# Patient Record
Sex: Male | Born: 1955
Health system: Southern US, Community
[De-identification: ages and names within clinical notes are randomized; demographics above are authoritative.]

## PROBLEM LIST (undated history)

## (undated) DIAGNOSIS — Z923 Personal history of irradiation: Secondary | ICD-10-CM

## (undated) DIAGNOSIS — I10 Essential (primary) hypertension: Secondary | ICD-10-CM

## (undated) DIAGNOSIS — Z87891 Personal history of nicotine dependence: Secondary | ICD-10-CM

## (undated) DIAGNOSIS — K5792 Diverticulitis of intestine, part unspecified, without perforation or abscess without bleeding: Secondary | ICD-10-CM

## (undated) DIAGNOSIS — J810 Acute pulmonary edema: Secondary | ICD-10-CM

## (undated) DIAGNOSIS — R51 Headache: Secondary | ICD-10-CM

## (undated) DIAGNOSIS — B1920 Unspecified viral hepatitis C without hepatic coma: Secondary | ICD-10-CM

## (undated) HISTORY — DX: Acute pulmonary edema: J81.0

## (undated) HISTORY — DX: Essential (primary) hypertension: I10

## (undated) HISTORY — DX: Personal history of irradiation: Z92.3

## (undated) HISTORY — DX: Diverticulitis of intestine, part unspecified, without perforation or abscess without bleeding: K57.92

## (undated) HISTORY — DX: Unspecified viral hepatitis C without hepatic coma: B19.20

## (undated) HISTORY — PX: TONSILLECTOMY: SUR1361

## (undated) HISTORY — DX: Personal history of nicotine dependence: Z87.891

---

## 2003-04-15 ENCOUNTER — Ambulatory Visit (HOSPITAL_COMMUNITY): Admission: EM | Admit: 2003-04-15 | Discharge: 2003-04-15 | Payer: Self-pay | Admitting: Emergency Medicine

## 2005-12-14 ENCOUNTER — Ambulatory Visit (HOSPITAL_COMMUNITY): Admission: RE | Admit: 2005-12-14 | Discharge: 2005-12-14 | Payer: Self-pay | Admitting: Cardiology

## 2009-09-24 ENCOUNTER — Inpatient Hospital Stay (HOSPITAL_COMMUNITY): Admission: EM | Admit: 2009-09-24 | Discharge: 2009-09-26 | Payer: Self-pay | Admitting: Emergency Medicine

## 2009-09-24 ENCOUNTER — Ambulatory Visit: Payer: Self-pay | Admitting: Infectious Diseases

## 2009-09-24 ENCOUNTER — Encounter: Payer: Self-pay | Admitting: Internal Medicine

## 2009-09-24 DIAGNOSIS — J984 Other disorders of lung: Secondary | ICD-10-CM | POA: Insufficient documentation

## 2009-09-26 ENCOUNTER — Encounter (INDEPENDENT_AMBULATORY_CARE_PROVIDER_SITE_OTHER): Payer: Self-pay | Admitting: Internal Medicine

## 2009-09-26 DIAGNOSIS — Z87891 Personal history of nicotine dependence: Secondary | ICD-10-CM | POA: Insufficient documentation

## 2009-09-26 DIAGNOSIS — I10 Essential (primary) hypertension: Secondary | ICD-10-CM

## 2009-09-26 HISTORY — DX: Essential (primary) hypertension: I10

## 2009-10-09 ENCOUNTER — Encounter (INDEPENDENT_AMBULATORY_CARE_PROVIDER_SITE_OTHER): Payer: Self-pay | Admitting: Internal Medicine

## 2009-10-09 ENCOUNTER — Ambulatory Visit: Payer: Self-pay | Admitting: Internal Medicine

## 2009-10-09 LAB — CONVERTED CEMR LAB
CO2: 22 meq/L (ref 19–32)
Glucose, Bld: 88 mg/dL (ref 70–99)

## 2009-10-31 ENCOUNTER — Ambulatory Visit: Payer: Self-pay | Admitting: Internal Medicine

## 2009-10-31 DIAGNOSIS — G4700 Insomnia, unspecified: Secondary | ICD-10-CM

## 2009-10-31 HISTORY — DX: Insomnia, unspecified: G47.00

## 2009-12-06 ENCOUNTER — Emergency Department (HOSPITAL_COMMUNITY): Admission: EM | Admit: 2009-12-06 | Discharge: 2009-12-06 | Payer: Self-pay | Admitting: Emergency Medicine

## 2010-10-30 NOTE — Assessment & Plan Note (Signed)
Summary: HFU/ NEW TO CLINIC/SB.   Vital Signs:  Patient profile:   55 year old male Height:      67 inches (170.18 cm) Weight:      184 pounds (27.73 kg) BMI:     28.92 Temp:     98.1 degrees F (36.72 degrees C) oral Pulse rate:   81 / minute BP sitting:   164 / 61  (right arm)  Vitals Entered By: Krystal Eaton Duncan Dull) (October 09, 2009 1:52 PM) CC: hfu Is Patient Diabetic? No Pain Assessment Patient in pain? no      Nutritional Status BMI of > 30 = obese  Have you ever been in a relationship where you felt threatened, hurt or afraid?No   Does patient need assistance? Functional Status Self care Ambulation Normal   Primary Care Provider:  Nilda Riggs MD  CC:  hfu.  History of Present Illness: Patient continues to feel much better than before when he was hospitalized for hypertensive crisis and pulmonary edema. No longer waking at night with trouble breathing. Has not had cough, congestion. No smoking sice hospitalization. Following with low salt diet, increased amount of fruits. Describes some symptoms of shin splints and pain with extensive walking in the lower extremities, but pain is bearable and mild when present. He is sleeping well and no longer needs ambien. No other complaints. All ROS negative.  Preventive Screening-Counseling & Management  Alcohol-Tobacco     Smoking Status: quit < 6 months  Current Medications (verified): 1)  Furosemide 40 Mg Tabs (Furosemide) .... Take 1 Tablet By Mouth Once A Day 2)  K-Vescent 20 Meq Pack (Potassium Chloride) .... Take 1 Tablet By Mouth Once A Day 3)  Labetalol Hcl 100 Mg Tabs (Labetalol Hcl) .... Take 1 Tablet By Mouth Two Times A Day 4)  Nicotine 21 Mg/24hr Pt24 (Nicotine) .... Please Apply One Patch Daily To The Skin After Removing The Previous Day's Patch.  Allergies (verified): No Known Drug Allergies  Past History:  Past Medical History: Hypertesion (long standing - since 2007, was treated for sometime, but  then discontinued taking meds on his own). Acute pulmonary edema: hospitalized in 09/2009 with hypertensive crisis and acute pulmonary edema - improved with lasix and labetalol. Tobacco abuse: 1-2 ppd for 40 years. Quit 09/2009.  Past Surgical History: None.  Social History: Lives at home with wife, daughter and her two children. Works in [employer] - for last 25 years Smoked 1-2 ppd for 40 years No drugs Alcohol: Drank 4 beers daily until hospitalization in 09/2009. Now does not drink any alcohol.Smoking Status:  quit < 6 months  Review of Systems      See HPI  Physical Exam  General:  alert, well-developed, well-nourished, and well-hydrated.   Head:  normocephalic and atraumatic.   Eyes:  vision grossly intact, pupils equal, pupils round, and pupils reactive to light.   Ears:  no external deformities.   Nose:  no external deformity, no external erythema, and no nasal discharge.   Mouth:  pharynx pink and moist, no erythema, no exudates, fair dentition, and excessive plaque.   Neck:  supple, full ROM, no masses, no thyromegaly, and no thyroid nodules or tenderness.   Lungs:  normal respiratory effort, no intercostal retractions, no accessory muscle use, normal breath sounds, no crackles, and no wheezes.   Heart:  normal rate, regular rhythm, no murmur, no gallop, and no rub.   Abdomen:  soft, non-tender, normal bowel sounds, no distention, no masses, and  no guarding.   Msk:  normal ROM, no joint tenderness, no joint swelling, and no joint warmth.   Pulses:  R radial normal and L radial normal.   Extremities:  No cyanosis, clubbing or edema. Neurologic:  alert & oriented X3, cranial nerves II-XII intact, strength normal in all extremities, sensation intact to light touch, sensation intact to pinprick, and gait normal.   Skin:  turgor normal, color normal, and no rashes.   Cervical Nodes:  no anterior cervical adenopathy.   Psych:  Oriented X3, memory intact for recent and  remote, normally interactive, good eye contact, not anxious appearing, and not depressed appearing.      Impression & Recommendations:  Problem # 1:  HYPERTENSION (ICD-401.9) Assessment Improved Will add Norvasc 5 mg. Patient continues to check bp at home with similar values as in clinic today.  Recheck in room was in high 150s over 80s manually and with the machine. Will see in return appointment in 1 month to check on progress toward bp goal. Much improved that when he was hospitalized. Patient is attempting to follow the DASH diet, may buy a book to help with recipe ideas.  His updated medication list for this problem includes:    Furosemide 40 Mg Tabs (Furosemide) .Marland Kitchen... Take 1 tablet by mouth once a day    Labetalol Hcl 100 Mg Tabs (Labetalol hcl) .Marland Kitchen... Take 1 tablet by mouth two times a day    Amlodipine Besylate 5 Mg Tabs (Amlodipine besylate) .Marland Kitchen... Take 1 tablet by mouth once a day  Orders: T-Basic Metabolic Panel [id number])  BP today: 164/61  Problem # 2:  TOBACCO ABUSE (ICD-305.1) Assessment: Improved Patient provided with script for step down to 14 mg from 21 mg/ day. Patient does not think he will need the patches and is committed to never smoking again. Of note, he has also stopped drinking any alcohol since his hospitalization in an effort to improve his health.  His updated medication list for this problem includes:    Nicoderm Cq 14 Mg/24hr Pt24 (Nicotine) .Marland Kitchen... Please apply one patch daily to the skin after removing the previous day's patch.  Problem # 3:  Preventive Health Care (ICD-V70.0) Patient to make appointment on his own with Dr. Clarene Duke, cardiology, for cardiac followup and possible stress test given hypertensive crisis hospitalization. He is also going to make a follow up appointment with his wife's gastroenterologist (where she had her colonoscopy done) for cancer screening.  Problem # 4:  PULMONARY EDEMA, ACUTE (ICD-518.89) Completely resolved, no  complaints since d/c.  Medications Added to Medication List This Visit: 1)  Nicoderm Cq 14 Mg/24hr Pt24 (Nicotine) .... Please apply one patch daily to the skin after removing the previous day's patch. 2)  Amlodipine Besylate 5 Mg Tabs (Amlodipine besylate) .... Take 1 tablet by mouth once a day  Complete Medication List: 1)  Furosemide 40 Mg Tabs (Furosemide) .... Take 1 tablet by mouth once a day 2)  K-vescent 20 Meq Pack (Potassium chloride) .... Take 1 tablet by mouth once a day 3)  Labetalol Hcl 100 Mg Tabs (Labetalol hcl) .... Take 1 tablet by mouth two times a day 4)  Nicoderm Cq 14 Mg/24hr Pt24 (Nicotine) .... Please apply one patch daily to the skin after removing the previous day's patch. 5)  Amlodipine Besylate 5 Mg Tabs (Amlodipine besylate) .... Take 1 tablet by mouth once a day  Patient Instructions: 1)  Please schedule a follow-up appointment in 1 month. 2)  Please remember  to schedule your followup appointments with a gastroenterologist for a screening colonoscopy and with your cardiologist, Dr. Clarene Duke, for followup after hospitalization in Jan. 2011. 3)  Keep up the good work as you have quit smoking. Prescriptions: AMLODIPINE BESYLATE 5 MG TABS (AMLODIPINE BESYLATE) Take 1 tablet by mouth once a day  #30 x 6   Entered and Authorized by:   Nilda Riggs MD   Signed by:   Nilda Riggs MD on 10/09/2009   Method used:   Print then Give to Patient   RxID:   [id number] NICODERM CQ 14 MG/24HR PT24 (NICOTINE) Please apply one patch daily to the skin after removing the previous day's patch.  #1 box x 0   Entered and Authorized by:   Nilda Riggs MD   Signed by:   Nilda Riggs MD on 10/09/2009   Method used:   Print then Give to Patient   RxID:   [id number]   Prevention & Chronic Care Immunizations   Influenza vaccine: Not documented   Influenza vaccine deferral: Deferred  (10/09/2009)    Tetanus booster: Not documented   Td booster deferral: Deferred   (10/09/2009)    Pneumococcal vaccine: Not documented   Pneumococcal vaccine deferral: Deferred  (10/09/2009)    Immunization comments: Received flu and pneumovax in hospital in 09/2009. Will get tetanus in future.  Colorectal Screening   Hemoccult: Not documented    Colonoscopy: Not documented   Colonoscopy action/deferral: Deferred  (10/09/2009)  Other Screening   PSA: Not documented   Smoking status: quit < 6 months  (10/09/2009)    Screening comments: Patient will schedule colonoscopy with wife's GI dr.  Scarlett Presto   Total Cholesterol: Not documented   LDL: Not documented   LDL Direct: Not documented   HDL: Not documented   Triglycerides: Not documented  Hypertension   Last Blood Pressure: 164 / 61  (10/09/2009)   Serum creatinine: Not documented   BMP action: Ordered   Serum potassium Not documented    Hypertension flowsheet reviewed?: Yes   Progress toward BP goal: Improved  Self-Management Support :   Personal Goals (by the next clinic visit) :      Personal blood pressure goal: 140/90  (10/09/2009)   Patient will work on the following items until the next clinic visit to reach self-care goals:     Medications and monitoring: take my medicines every day  (10/09/2009)     Eating: eat more vegetables, eat foods that are low in salt, eat baked foods instead of fried foods  (10/09/2009)     Activity: join a walking program  (10/09/2009)    Hypertension self-management support: Written self-care plan  (10/09/2009)   Hypertension self-care plan printed.  Process Orders Check Orders Results:     [facility]: ABN not required for this insurance Tests Sent for requisitioning (October 10, 2009 9:23 PM):     10/09/2009: [facility] -- T-Basic Metabolic Panel [id number] (signed)

## 2010-10-30 NOTE — Miscellaneous (Signed)
Summary: HIPAA Restrictions  HIPAA Restrictions   Imported By: Florinda Marker 10/09/2009 16:37:05  _____________________________________________________________________  External Attachment:    Type:   Image     Comment:   External Document

## 2010-10-30 NOTE — Assessment & Plan Note (Signed)
Summary: 56month check up per dr evans/cfb   Vital Signs:  Patient profile:   55 year old male Height:      67 inches (170.18 cm) Weight:      182.3 pounds (82.86 kg) BMI:     28.66 Temp:     98.5 degrees F oral Pulse rate:   77 / minute BP sitting:   150 / 95  (right arm)  Vitals Entered By: Chinita Pester RN (October 31, 2009 8:47 AM) CC: 1 month f/u visit; needs med refills; BP has been elevated. Is Patient Diabetic? No Pain Assessment Patient in pain? no      Nutritional Status BMI of 25 - 29 = overweight  Have you ever been in a relationship where you felt threatened, hurt or afraid?No   Does patient need assistance? Functional Status Self care Ambulation Normal   Immunization History:  Influenza Immunization History:    Influenza:  historical (09/16/2009)   Primary Care Provider:  Nilda Riggs MD  CC:  1 month f/u visit; needs med refills; BP has been elevated.Marland Kitchen  History of Present Illness: 55 year old male with PMH significant for HTN, tobacco abuse, and recent admission 08/2009 for HTN crisis and flash pulmonary edema who presents for a 1 month follow-up. Pt reports he is still not smoking, have been on nicotine patch, and has been taking all medications as prescribed. Pt reports he is feeling better, and that he has been checking his blood pressure regularly. He reports that when he checks his pressure, it is sometimes elevated in the 160s range, he is working on his diet, and reports improvement.   Preventive Screening-Counseling & Management  Alcohol-Tobacco     Alcohol drinks/day: 0     Alcohol type: quit drinking beer     Smoking Status: quit < 6 months     Year Started: 40 yrs. ago     Year Quit: 09/24/09  Caffeine-Diet-Exercise     Type of exercise: walks at work  Current Medications (verified): 1)  Furosemide 40 Mg Tabs (Furosemide) .... Take 1 Tablet By Mouth Once A Day 2)  K-Vescent 20 Meq Pack (Potassium Chloride) .... Take 1 Tablet By Mouth  Once A Day 3)  Labetalol Hcl 100 Mg Tabs (Labetalol Hcl) .... Take 1 Tablet By Mouth Two Times A Day 4)  Nicoderm Cq 14 Mg/24hr Pt24 (Nicotine) .... Please Apply One Patch Daily To The Skin After Removing The Previous Day's Patch. 5)  Amlodipine Besylate 5 Mg Tabs (Amlodipine Besylate) .... Take 1 Tablet By Mouth Once A Day  Allergies (verified): No Known Drug Allergies  Past History:  Past Medical History: Last updated: 10/09/2009 Hypertesion (long standing - since 2007, was treated for sometime, but then discontinued taking meds on his own). Acute pulmonary edema: hospitalized in 09/2009 with hypertensive crisis and acute pulmonary edema - improved with lasix and labetalol. Tobacco abuse: 1-2 ppd for 40 years. Quit 09/2009.  Past Surgical History: Last updated: 10/09/2009 None.  Social History: Last updated: 10/09/2009 Lives at home with wife, daughter and her two children. Works in [employer] - for last 25 years Smoked 1-2 ppd for 40 years No drugs Alcohol: Drank 4 beers daily until hospitalization in 09/2009. Now does not drink any alcohol.  Risk Factors: Alcohol Use: 0 (10/31/2009)  Risk Factors: Smoking Status: quit < 6 months (10/31/2009)  Review of Systems General:  Denies fatigue, sweats, and weakness. Eyes:  Denies blurring. CV:  Denies chest pain or discomfort, difficulty  breathing at night, difficulty breathing while lying down, fatigue, leg cramps with exertion, lightheadness, palpitations, shortness of breath with exertion, and swelling of feet. Resp:  Denies chest pain with inspiration and cough. GI:  Denies abdominal pain, change in bowel habits, nausea, and vomiting. GU:  Denies dysuria, urinary frequency, and urinary hesitancy. MS:  Denies joint pain.  Physical Exam  General:  alert and well-developed.   Head:  normocephalic and atraumatic.   Eyes:  vision grossly intact, pupils equal, pupils round, and pupils reactive to light.   Ears:  R ear  normal and L ear normal.   Mouth:  good dentition.   Neck:  supple, full ROM, and no masses.   Lungs:  normal respiratory effort, no intercostal retractions, no accessory muscle use, normal breath sounds, no dullness, no fremitus, no crackles, and no wheezes.   Heart:  normal rate, regular rhythm, no murmur, no gallop, and no rub.   Abdomen:  soft, non-tender, normal bowel sounds, and no distention.   Msk:  normal ROM and no joint tenderness.   Pulses:  R radial normal, R dorsalis pedis normal, L radial normal, and L dorsalis pedis normal.   Extremities:  no lower extremity edema noted Neurologic:  alert & oriented X3 and cranial nerves II-XII intact.     Impression & Recommendations:  Problem # 1:  HYPERTENSION (ICD-401.9) Blood pressure has improved and was slightly elevated when checked by machine, but upon manual check was wnl. I will increase his Norvasc and have advised patient to contact clinic if he develops any lower extremity swelling. Pt still has not scheduled stress test, but states he is planning on scheduling one now that his blood pressure is under better control. Pt has been compliant with diet and plans to increase exercise.   His updated medication list for this problem includes:    Furosemide 40 Mg Tabs (Furosemide) .Marland Kitchen... Take 1 tablet by mouth once a day    Labetalol Hcl 100 Mg Tabs (Labetalol hcl) .Marland Kitchen... Take 1 tablet by mouth two times a day    Amlodipine Besylate 10 Mg Tabs (Amlodipine besylate) .Marland Kitchen... Take 1 tablet by mouth once a day  BP recheck manually: 138/88 BP today: 150/95 Prior BP: 164/61 (10/09/2009)  Labs Reviewed: K+: 4.2 (10/09/2009) Creat: : 0.94 (10/09/2009)     Problem # 2:  PULMONARY EDEMA, ACUTE (ICD-518.89) Resolved. Pt is on Lasix therapy, no crackles appreciated on exam and pt does not complain of shortness of breath.   Problem # 3:  INSOMNIA (ICD-780.52) Pt reports difficulty initiating sleep and has tried agents such as benedryl without  success. I suspect his insomnia is partially related to his smoking cessation (withdrawal). Pt reports good sleep hygene. He reports using Ambien in the past, and was given [place] during recent hospitalization. Will prescribe ambien for insomnia.   His updated medication list for this problem includes:    Zolpidem Tartrate 5 Mg Tabs (Zolpidem tartrate)  Problem # 4:  TOBACCO ABUSE (ICD-305.1) Assessment: Comment Only Pt is using nicotine patch with success. Will continue with patch as directed.   His updated medication list for this problem includes:    Nicoderm Cq 14 Mg/24hr Pt24 (Nicotine) .Marland Kitchen... Please apply one patch daily to the skin after removing the previous day's patch.  Complete Medication List: 1)  Furosemide 40 Mg Tabs (Furosemide) .... Take 1 tablet by mouth once a day 2)  K-vescent 20 Meq Pack (Potassium chloride) .... Take 1 tablet by mouth once a  day 3)  Labetalol Hcl 100 Mg Tabs (Labetalol hcl) .... Take 1 tablet by mouth two times a day 4)  Nicoderm Cq 14 Mg/24hr Pt24 (Nicotine) .... Please apply one patch daily to the skin after removing the previous day's patch. 5)  Amlodipine Besylate 10 Mg Tabs (Amlodipine besylate) .... Take 1 tablet by mouth once a day 6)  Zolpidem Tartrate 5 Mg Tabs (Zolpidem tartrate)  Patient Instructions: 1)  Please schedule a follow-up appointment in 3 months. 2)  Please contact clinic if you develop shortness of breath, or lower extremity swelling.  3)  Check your Blood Pressure regularly. If it is above: you should make an appointment. Prescriptions: ZOLPIDEM TARTRATE 5 MG TABS (ZOLPIDEM TARTRATE)   #30 x 2   Entered and Authorized by:   Melida Quitter MD   Signed by:   Melida Quitter MD on 10/31/2009   Method used:   Print then Give to Patient   RxID:   [id number] AMLODIPINE BESYLATE 10 MG TABS (AMLODIPINE BESYLATE) Take 1 tablet by mouth once a day  #90 x 3   Entered and Authorized by:   Melida Quitter MD   Signed by:   Melida Quitter MD on 10/31/2009   Method used:   Print then Give to Patient   RxID:   [id number] LABETALOL HCL 100 MG TABS (LABETALOL HCL) Take 1 tablet by mouth two times a day  #180 x 3   Entered and Authorized by:   Melida Quitter MD   Signed by:   Melida Quitter MD on 10/31/2009   Method used:   Print then Give to Patient   RxID:   [id number] K-VESCENT 20 MEQ PACK (POTASSIUM CHLORIDE) Take 1 tablet by mouth once a day  #90 x 3   Entered and Authorized by:   Melida Quitter MD   Signed by:   Melida Quitter MD on 10/31/2009   Method used:   Print then Give to Patient   RxID:   [id number] FUROSEMIDE 40 MG TABS (FUROSEMIDE) Take 1 tablet by mouth once a day  #90 x 3   Entered and Authorized by:   Melida Quitter MD   Signed by:   Melida Quitter MD on 10/31/2009   Method used:   Print then Give to Patient   RxID:   [id number]   Prevention & Chronic Care Immunizations   Influenza vaccine: Historical  (09/16/2009)   Influenza vaccine deferral: Deferred  (10/09/2009)    Tetanus booster: Not documented   Td booster deferral: Deferred  (10/09/2009)    Pneumococcal vaccine: Not documented   Pneumococcal vaccine deferral: Deferred  (10/09/2009)  Colorectal Screening   Hemoccult: Not documented    Colonoscopy: Not documented   Colonoscopy action/deferral: Deferred  (10/09/2009)  Other Screening   PSA: Not documented   Smoking status: quit < 6 months  (10/31/2009)  Lipids   Total Cholesterol: Not documented   LDL: Not documented   LDL Direct: Not documented   HDL: Not documented   Triglycerides: Not documented  Hypertension   Last Blood Pressure: 150 / 95  (10/31/2009)   Serum creatinine: 0.94  (10/09/2009)   BMP action: Ordered   Serum potassium 4.2  (10/09/2009)    Hypertension flowsheet reviewed?: Yes   Progress toward BP goal: Improved  Self-Management Support :   Personal Goals (by the next clinic visit) :      Personal blood pressure goal: 140/90   (10/31/2009)   Patient will work on the following items until  the next clinic visit to reach self-care goals:     Medications and monitoring: bring all of my medications to every visit  (10/31/2009)     Eating: drink diet soda or water instead of juice or soda, eat more vegetables, use fresh or frozen vegetables, eat foods that are low in salt, eat baked foods instead of fried foods, eat fruit for snacks and desserts, limit or avoid alcohol  (10/31/2009)     Activity: take a 30 minute walk every day  (10/31/2009)    Hypertension self-management support: BP self-monitoring log, Written self-care plan, Education handout  (10/31/2009)   Hypertension self-care plan printed.   Hypertension education handout printed

## 2010-11-30 ENCOUNTER — Encounter: Payer: Self-pay | Admitting: Internal Medicine

## 2010-12-29 LAB — COMPREHENSIVE METABOLIC PANEL
ALT: 42 U/L (ref 0–53)
Albumin: 3.4 g/dL — ABNORMAL LOW (ref 3.5–5.2)
CO2: 20 mEq/L (ref 19–32)
Calcium: 8.7 mg/dL (ref 8.4–10.5)
Creatinine, Ser: 0.91 mg/dL (ref 0.4–1.5)
GFR calc Af Amer: >60 mL/min (ref >60)
Glucose, Bld: 217 mg/dL — ABNORMAL HIGH (ref 70–99)
Total Bilirubin: 0.7 mg/dL (ref 0.3–1.2)

## 2010-12-29 LAB — CBC
Hemoglobin: 16.8 g/dL (ref 13.0–17.0)
MCHC: 33.8 g/dL (ref 30.0–36.0)
MCHC: 33.9 g/dL (ref 30.0–36.0)
MCHC: 34.1 g/dL (ref 30.0–36.0)
MCV: 89.7 fL (ref 78.0–100.0)
MCV: 90.6 fL (ref 78.0–100.0)
Platelets: 203 10*3/uL (ref 150–400)
RDW: 13.2 % (ref 11.5–15.5)
WBC: 10.1 10*3/uL (ref 4.0–10.5)
WBC: 15.4 10*3/uL — ABNORMAL HIGH (ref 4.0–10.5)

## 2010-12-29 LAB — POCT I-STAT, CHEM 8
BUN: 15 mg/dL (ref 6–23)
Glucose, Bld: 225 mg/dL — ABNORMAL HIGH (ref 70–99)
Hemoglobin: 18 g/dL — ABNORMAL HIGH (ref 13.0–17.0)
Potassium: 4.2 mEq/L (ref 3.5–5.1)

## 2010-12-29 LAB — DIFFERENTIAL
Eosinophils Relative: 2 % (ref 0–5)
Lymphocytes Relative: 19 % (ref 12–46)
Monocytes Absolute: 0.4 10*3/uL (ref 0.1–1.0)
Monocytes Relative: 4 % (ref 3–12)

## 2010-12-29 LAB — URINALYSIS, ROUTINE W REFLEX MICROSCOPIC
Glucose, UA: 250 mg/dL — AB
Ketones, ur: NEGATIVE mg/dL
Nitrite: NEGATIVE
pH: 6.5 (ref 5.0–8.0)

## 2010-12-29 LAB — GLUCOSE, CAPILLARY
Glucose-Capillary: 105 mg/dL — ABNORMAL HIGH (ref 70–99)
Glucose-Capillary: 131 mg/dL — ABNORMAL HIGH (ref 70–99)
Glucose-Capillary: 137 mg/dL — ABNORMAL HIGH (ref 70–99)
Glucose-Capillary: 143 mg/dL — ABNORMAL HIGH (ref 70–99)
Glucose-Capillary: 143 mg/dL — ABNORMAL HIGH (ref 70–99)
Glucose-Capillary: 170 mg/dL — ABNORMAL HIGH (ref 70–99)
Glucose-Capillary: 178 mg/dL — ABNORMAL HIGH (ref 70–99)
Glucose-Capillary: 91 mg/dL (ref 70–99)

## 2010-12-29 LAB — BASIC METABOLIC PANEL
BUN: 17 mg/dL (ref 6–23)
BUN: 24 mg/dL — ABNORMAL HIGH (ref 6–23)
CO2: 23 mEq/L (ref 19–32)
CO2: 24 mEq/L (ref 19–32)
Calcium: 9.3 mg/dL (ref 8.4–10.5)
Chloride: 100 mEq/L (ref 96–112)
Chloride: 103 mEq/L (ref 96–112)
Creatinine, Ser: 1.01 mg/dL (ref 0.4–1.5)
Creatinine, Ser: 1.09 mg/dL (ref 0.4–1.5)
Creatinine, Ser: 1.13 mg/dL (ref 0.4–1.5)
GFR calc non Af Amer: >60 mL/min (ref >60)
Glucose, Bld: 123 mg/dL — ABNORMAL HIGH (ref 70–99)
Glucose, Bld: 134 mg/dL — ABNORMAL HIGH (ref 70–99)
Potassium: 3.9 mEq/L (ref 3.5–5.1)

## 2010-12-29 LAB — CARDIAC PANEL(CRET KIN+CKTOT+MB+TROPI)
CK, MB: 2.8 ng/mL (ref 0.3–4.0)
Total CK: 223 U/L (ref 7–232)
Troponin I: 0.04 ng/mL (ref 0.00–0.06)
Troponin I: 0.09 ng/mL — ABNORMAL HIGH (ref 0.00–0.06)

## 2010-12-29 LAB — CULTURE, BLOOD (ROUTINE X 2)
Culture: NO GROWTH
Culture: NO GROWTH

## 2010-12-29 LAB — LIPID PANEL
HDL: 38 mg/dL — ABNORMAL LOW (ref >39)
VLDL: 10 mg/dL (ref 0–40)

## 2010-12-29 LAB — HIV ANTIBODY (ROUTINE TESTING W REFLEX): HIV: NONREACTIVE

## 2010-12-29 LAB — RAPID URINE DRUG SCREEN, HOSP PERFORMED
Barbiturates: NOT DETECTED
Cocaine: NOT DETECTED
Opiates: NOT DETECTED

## 2010-12-29 LAB — LACTIC ACID, PLASMA: Lactic Acid, Venous: 3.5 mmol/L — ABNORMAL HIGH (ref 0.5–2.2)

## 2010-12-29 LAB — POCT I-STAT 3, ART BLOOD GAS (G3+)
O2 Saturation: 96 %
pCO2 arterial: 38 mmHg (ref 35.0–45.0)
pH, Arterial: 7.388 (ref 7.350–7.450)
pO2, Arterial: 80 mmHg (ref 80.0–100.0)

## 2010-12-29 LAB — CK TOTAL AND CKMB (NOT AT ARMC)
CK, MB: 2.1 ng/mL (ref 0.3–4.0)
Total CK: 94 U/L (ref 7–232)

## 2010-12-29 LAB — ALDOSTERONE + RENIN ACTIVITY W/ RATIO
ALDO / PRA Ratio: 1.8 Ratio (ref 0.9–28.9)
Aldosterone: 4 ng/dL
PRA LC/MS/MS: 2.23 ng/mL/h (ref 0.25–5.82)

## 2010-12-29 LAB — POCT CARDIAC MARKERS: Troponin i, poc: <0.05 ng/mL (ref 0.00–0.09)

## 2010-12-29 LAB — URINE MICROSCOPIC-ADD ON

## 2010-12-29 LAB — URINE CULTURE: Colony Count: NO GROWTH

## 2010-12-29 LAB — PROTIME-INR: Prothrombin Time: 14.1 seconds (ref 11.6–15.2)

## 2010-12-29 LAB — TSH: TSH: 1.086 u[IU]/mL (ref 0.350–4.500)

## 2010-12-29 LAB — HEMOGLOBIN A1C: Hgb A1c MFr Bld: 5.8 % (ref 4.6–6.1)

## 2010-12-29 LAB — TROPONIN I: Troponin I: 0.03 ng/mL (ref 0.00–0.06)

## 2010-12-29 LAB — BRAIN NATRIURETIC PEPTIDE: Pro B Natriuretic peptide (BNP): 378 pg/mL — ABNORMAL HIGH (ref 0.0–100.0)

## 2010-12-29 LAB — MRSA PCR SCREENING: MRSA by PCR: NEGATIVE

## 2011-02-13 NOTE — Op Note (Signed)
NAME:  Ellegood, Hau                               ACCOUNT NO.:  [id number]   MEDICAL RECORD NO.:  [id number]                    PATIENT TYPE:   LOCATION:                                       FACILITY:   PHYSICIAN:  Graylin Shiver, M.D.                DATE OF BIRTH:   DATE OF PROCEDURE:  04/15/2003  DATE OF DISCHARGE:                                 OPERATIVE REPORT   PROCEDURE:  Esophagogastroduodenoscopy with foreign body removal as an  esophageal meat impaction and biopsy for CLO-test.   INDICATIONS FOR PROCEDURE:  This patient is a 55 year old male who presented  to the emergency room with complaint of being unable to swallow.  He was  eating pork four days ago and states that he got distracted and swallowed  the large piece of pork without chewing it.  He put up with it for the last  few days thinking that it would pass but it did not.  He decided to come to  the emergency room.   Informed consent was obtained after explanation of the risks of bleeding,  infection and perforation with possible need for surgery.   PREMEDICATIONS:  Fentanyl total dose 115 mcg IV, Versed total dose 16 mg IV.   DESCRIPTION OF PROCEDURE:  With the patient in the left lateral decubitus  position, the Olympus gastroscope was inserted into the oropharynx and  passed into the esophagus.  It was advanced down the esophagus into the  lower esophagus at which point a large amount of meat could be seen.  The  snare with basket was initially put down and pieces of the meat were  grabbed.  The meat was fragmenting as it was grabbed and multiple pieces  were removed on several occasions.  The scope had to be repassed on several  occasions.  I used a snare.  I used a tripod to try to dislodge the large  piece of meat impacted at the distal esophagus and finally after multiple  attempts, the meat impaction was dislodged and removed.  The scope was  reinserted and the scope was advanced down the esophagus.   There was  irritation at the level of the distal esophagus in the EG junction where the  meat had been impacted for the past four days. I did not see an osseous  stricture.  I was able to advance the scope into the stomach and on into the  duodenum.  The second part of the duodenum looked normal.  The bulb of the  duodenum revealed a duodenum bulb ulcer which was about 6 mm in size and  multiple smaller duodenal erosions.  The stomach showed a gastritis.  No  ulcers were seen.  Biopsy for CLO-test was obtained.  No lesions were seen  upon retroflexion of the gastroscope in the fundus or cardia of the stomach.  The scope  was then brought out.  The rest of the smaller pieces of meat did  pass spontaneously into the stomach.  He tolerated the procedure well  without complications.   IMPRESSION:  1. Esophageal meat impaction which was removed.  2. Duodenum ulcer with multiple erosions.   COMMENTS:  The patient will be given a prescription for Aciphex 20 mg daily.  The CLO-test will be checked.                                                Graylin Shiver, M.D.   Germain Osgood  D:  04/15/2003  T:  04/15/2003  Job:  [id number]

## 2011-08-21 ENCOUNTER — Ambulatory Visit (HOSPITAL_COMMUNITY): Admit: 2011-08-21 | Payer: Self-pay | Admitting: Gastroenterology

## 2011-08-21 ENCOUNTER — Encounter (HOSPITAL_COMMUNITY): Payer: Self-pay | Admitting: *Deleted

## 2011-08-21 ENCOUNTER — Emergency Department (HOSPITAL_BASED_OUTPATIENT_CLINIC_OR_DEPARTMENT_OTHER)
Admission: EM | Admit: 2011-08-21 | Discharge: 2011-08-21 | Disposition: A | Discharge status: Still In Hospital | Payer: BC Managed Care – PPO | Source: Home / Self Care | Attending: Emergency Medicine | Admitting: Emergency Medicine

## 2011-08-21 ENCOUNTER — Ambulatory Visit (HOSPITAL_COMMUNITY)
Admission: AD | Admit: 2011-08-21 | Discharge: 2011-08-21 | Disposition: A | Discharge status: Home / Self Care | Payer: BC Managed Care – PPO | Source: Ambulatory Visit | Attending: Emergency Medicine | Admitting: Emergency Medicine

## 2011-08-21 ENCOUNTER — Encounter (HOSPITAL_BASED_OUTPATIENT_CLINIC_OR_DEPARTMENT_OTHER): Payer: Self-pay | Admitting: *Deleted

## 2011-08-21 ENCOUNTER — Encounter (HOSPITAL_BASED_OUTPATIENT_CLINIC_OR_DEPARTMENT_OTHER)
Admission: EM | Disposition: A | Discharge status: Still In Hospital | Payer: Self-pay | Source: Home / Self Care | Attending: Emergency Medicine

## 2011-08-21 ENCOUNTER — Encounter (HOSPITAL_COMMUNITY)
Admission: AD | Disposition: A | Discharge status: Home / Self Care | Payer: Self-pay | Source: Ambulatory Visit | Attending: Emergency Medicine

## 2011-08-21 DIAGNOSIS — K219 Gastro-esophageal reflux disease without esophagitis: Secondary | ICD-10-CM | POA: Insufficient documentation

## 2011-08-21 DIAGNOSIS — IMO0002 Reserved for concepts with insufficient information to code with codable children: Secondary | ICD-10-CM | POA: Insufficient documentation

## 2011-08-21 DIAGNOSIS — T18108A Unspecified foreign body in esophagus causing other injury, initial encounter: Secondary | ICD-10-CM | POA: Insufficient documentation

## 2011-08-21 DIAGNOSIS — I10 Essential (primary) hypertension: Secondary | ICD-10-CM | POA: Insufficient documentation

## 2011-08-21 DIAGNOSIS — K209 Esophagitis, unspecified without bleeding: Secondary | ICD-10-CM | POA: Insufficient documentation

## 2011-08-21 DIAGNOSIS — Z79899 Other long term (current) drug therapy: Secondary | ICD-10-CM | POA: Insufficient documentation

## 2011-08-21 DIAGNOSIS — K2970 Gastritis, unspecified, without bleeding: Secondary | ICD-10-CM | POA: Insufficient documentation

## 2011-08-21 HISTORY — PX: ESOPHAGOGASTRODUODENOSCOPY: SHX5428

## 2011-08-21 HISTORY — DX: Headache: R51

## 2011-08-21 LAB — CBC
HCT: 46.2 % (ref 39.0–52.0)
MCH: 29.8 pg (ref 26.0–34.0)
MCHC: 34.8 g/dL (ref 30.0–36.0)
RDW: 12.4 % (ref 11.5–15.5)

## 2011-08-21 LAB — BASIC METABOLIC PANEL
BUN: 14 mg/dL (ref 6–23)
Chloride: 103 mEq/L (ref 96–112)
Creatinine, Ser: 0.8 mg/dL (ref 0.50–1.35)
GFR calc Af Amer: >90 mL/min (ref >90)
Glucose, Bld: 110 mg/dL — ABNORMAL HIGH (ref 70–99)

## 2011-08-21 SURGERY — EGD (ESOPHAGOGASTRODUODENOSCOPY)
Anesthesia: Moderate Sedation

## 2011-08-21 MED ORDER — DIAZEPAM 5 MG/ML IJ SOLN
5.0000 mg | Freq: Once | INTRAMUSCULAR | Status: AC
  Administered 2011-08-21: 5 mg via INTRAVENOUS
  Filled 2011-08-21: qty 2

## 2011-08-21 MED ORDER — FENTANYL CITRATE 0.05 MG/ML IJ SOLN
INTRAMUSCULAR | Status: AC
  Filled 2011-08-21: qty 2

## 2011-08-21 MED ORDER — SODIUM CHLORIDE 0.9 % IV BOLUS (SEPSIS)
1000.0000 mL | Freq: Once | INTRAVENOUS | Status: AC
  Administered 2011-08-21: 1000 mL via INTRAVENOUS

## 2011-08-21 MED ORDER — DIPHENHYDRAMINE HCL 50 MG/ML IJ SOLN
INTRAMUSCULAR | Status: AC
  Filled 2011-08-21: qty 1

## 2011-08-21 MED ORDER — LABETALOL HCL 5 MG/ML IV SOLN
INTRAVENOUS | Status: AC
  Filled 2011-08-21: qty 4

## 2011-08-21 MED ORDER — LIDOCAINE VISCOUS 2 % MT SOLN
OROMUCOSAL | Status: AC
  Filled 2011-08-21: qty 15

## 2011-08-21 MED ORDER — MIDAZOLAM HCL 10 MG/2ML IJ SOLN
INTRAMUSCULAR | Status: AC
  Filled 2011-08-21: qty 2

## 2011-08-21 MED ORDER — LABETALOL HCL 100 MG PO TABS: 100.0000 mg | ORAL_TABLET | Freq: Two times a day (BID) | ORAL | Status: DC

## 2011-08-21 MED ORDER — LABETALOL HCL 5 MG/ML IV SOLN
20.0000 mg | Freq: Once | INTRAVENOUS | Status: AC
  Administered 2011-08-21: 20 mg via INTRAVENOUS
  Filled 2011-08-21: qty 4

## 2011-08-21 MED ORDER — FENTANYL NICU IV SYRINGE 50 MCG/ML
INJECTION | INTRAMUSCULAR | Status: DC | PRN
  Administered 2011-08-21 (×6): 25 ug via INTRAVENOUS

## 2011-08-21 MED ORDER — MIDAZOLAM HCL 10 MG/2ML IJ SOLN
INTRAMUSCULAR | Status: DC | PRN
  Administered 2011-08-21 (×7): 2 mg via INTRAVENOUS
  Administered 2011-08-21: 1 mg via INTRAVENOUS

## 2011-08-21 MED ORDER — AMLODIPINE BESYLATE 10 MG PO TABS: 10.0000 mg | ORAL_TABLET | Freq: Every day | ORAL | Status: DC

## 2011-08-21 MED ORDER — LABETALOL HCL 5 MG/ML IV SOLN
5.0000 mg | Freq: Once | INTRAVENOUS | Status: AC
  Administered 2011-08-21 (×2): 5 mg via INTRAVENOUS
  Filled 2011-08-21: qty 4

## 2011-08-21 MED ORDER — SODIUM CHLORIDE 0.9 % IV SOLN
Freq: Once | INTRAVENOUS | Status: AC
  Administered 2011-08-21: 500 mL via INTRAVENOUS

## 2011-08-21 MED ORDER — GLUCAGON HCL (RDNA) 1 MG IJ SOLR
1.0000 mg | Freq: Once | INTRAMUSCULAR | Status: AC
  Administered 2011-08-21: 1 mg via INTRAVENOUS
  Filled 2011-08-21: qty 1

## 2011-08-21 NOTE — ED Notes (Signed)
WUJ:WJXB<JY> Expected date:08/21/11<BR> Expected time: 1:23 PM<BR> Means of arrival:<BR> Comments:<BR> From endo

## 2011-08-21 NOTE — ED Provider Notes (Signed)
History     CSN: [id number] Arrival date & time: 08/21/2011 11:26 AM   First MD Initiated Contact with Patient 08/21/11 1514      Chief Complaint  Patient presents with  . Hypertension    (Consider location/radiation/quality/duration/timing/severity/associated sxs/prior treatment) Patient is a 55 y.o. male presenting with hypertension. The history is provided by the patient.  Hypertension This is a chronic problem. Episode onset: He ran out of his BP meds 2 mos. ago.  Progression since onset: Today he presented to [facility], and was eval. for esophageal obstruction. He has since had a food bolus removed by Dr Loreta Ave and is here now for BP meds refill. Pertinent negatives include no chest pain, no abdominal pain, no headaches and no shortness of breath. The symptoms are aggravated by nothing. The symptoms are relieved by nothing. Treatments tried: He was given a dose of Labetolol in the operative theatre. Improvement on treatment: BP improved.    Past Medical History  Diagnosis Date  . Hypertension     long standing since 2007, was treated for sometime, but then discontinued taking meds on his own.  . Pulmonary edema, acute     hospitalized in 09/2009 with hypertensive crisis and acute pulmonary edema- improved with lasix nad labetalol.  . History of tobacco abuse   . Shortness of breath     on exertion  . Headache     Past Surgical History  Procedure Date  . Tonsillectomy     History reviewed. No pertinent family history.  History  Substance Use Topics  . Smoking status: Former Smoker -- 1.0 packs/day for 40 years    Types: Cigarettes    Quit date: 10/01/2009  . Smokeless tobacco: Not on file  . Alcohol Use: No     drank 4 beers daily until hospitalization in 09/2009. Now does not drink any alcohol.      Review of Systems  Respiratory: Negative for shortness of breath.   Cardiovascular: Negative for chest pain.  Gastrointestinal: Negative for abdominal pain.    Neurological: Negative for headaches.  All other systems reviewed and are negative.    Allergies  Review of patient's allergies indicates no known allergies.  Home Medications   Current Outpatient Rx  Name Route Sig Dispense Refill  . FUROSEMIDE 40 MG PO TABS Oral Take 40 mg by mouth daily.     Marland Kitchen AMLODIPINE BESYLATE 10 MG PO TABS Oral Take 10 mg by mouth daily.     Marland Kitchen LABETALOL HCL 100 MG PO TABS Oral Take 100 mg by mouth 2 (two) times daily.       BP 188/118  Pulse 87  Temp(Src) 98.1 F (36.7 C) (Oral)  Resp 23  Ht 5\' 7"  (1.702 m)  Wt 200 lb (90.719 kg)  BMI 31.32 kg/m2  SpO2 96%  Physical Exam  Constitutional: He is oriented to person, place, and time. He appears well-developed and well-nourished.  HENT:  Head: Normocephalic and atraumatic.  Eyes: Conjunctivae and EOM are normal. Pupils are equal, round, and reactive to light.  Neck: Normal range of motion. Neck supple.  Cardiovascular: Normal rate.   Pulmonary/Chest: Effort normal and breath sounds normal.  Abdominal: Soft. Bowel sounds are normal.  Neurological: He is alert and oriented to person, place, and time. No cranial nerve deficit. He exhibits normal muscle tone. Coordination normal.  Skin: Skin is dry.  Psychiatric: He has a normal mood and affect. His behavior is normal. Thought content normal.    ED Course  Procedures (including critical care time)  Labs Reviewed - No data to display No results found. Reviewed old charts: prior BP treatments: Labetolol, Norvasc, and Lasix  No diagnosis found.    MDM  Assymptomatic HTN; doubt hypertensive crises, ACS or metabolic instability.        Flint Melter, MD 08/22/11 [phone]

## 2011-08-21 NOTE — ED Notes (Signed)
Pt sts he has [place] stuck in his throat since yesterday.

## 2011-08-21 NOTE — ED Notes (Signed)
Pt leaving ER at this time via [facility] to [facility].

## 2011-08-21 NOTE — H&P (Signed)
Matthew Lee is an 55 y.o. male.   Chief Complaint: Food impaction since 6 PM yesterday. He has had this happen on 3 occasions in the past.  HPI: 55 year old white male with a food impaction since last night. [facility] and was given a trial of Glucagon along with Labetalol for significantly elevated BP. As he was not able to pass the meat bolus after the administration of glucagon he was sent here to [facility] for an endoscopy. Still had elevated BP prior to the EGD and therefore  was given another 10 mg of Labetalol IV. Has a history of reflux and headaches; takes Ibuprofen on a PRN basis. Very NON-COMPLIANT with medical care [wants to know if we can refill his medications while he is here for an EGD].  Past Medical History  Diagnosis Date  . Hypertension     long standing since 2007, was treated for sometime, but then discontinued taking meds on his own.  . Pulmonary edema, acute     hospitalized in 09/2009 with hypertensive crisis and acute pulmonary edema- improved with lasix nad labetalol.  . History of tobacco abuse   . Shortness of breath     on exertion  . Headache     Past Surgical History  Procedure Date  . Tonsillectomy     History reviewed. No pertinent family history. Social History:  reports that he quit smoking about 22 months ago. His smoking use included Cigarettes. He has a 40 pack-year smoking history. He does not have any smokeless tobacco history on file. He reports that he does not drink alcohol or use illicit drugs.  Allergies: No Known Allergies  Medications Prior to Admission  Medication Dose Route Frequency Provider Last Rate Last Dose  . 0.9 %  sodium chloride infusion   Intravenous Once Charna Elizabeth, MD 20 mL/hr at 08/21/11 1152 500 mL at 08/21/11 1152  . diazepam (VALIUM) injection 5 mg  5 mg Intravenous Once Suzi Roots, MD   5 mg at 08/21/11 0857  . glucagon (GLUCAGEN) injection 1 mg  1 mg Intravenous Once Suzi Roots, MD   1 mg at  08/21/11 0857  . labetalol (NORMODYNE,TRANDATE) injection 20 mg  20 mg Intravenous Once Suzi Roots, MD   20 mg at 08/21/11 0930  . labetalol (NORMODYNE,TRANDATE) injection 5 mg  5 mg Intravenous Once Charna Elizabeth, MD   5 mg at 08/21/11 1147  . sodium chloride 0.9 % bolus 1,000 mL  1,000 mL Intravenous Once Suzi Roots, MD   1,000 mL at 08/21/11 0857   Medications Prior to Admission  Medication Sig Dispense Refill  . furosemide (LASIX) 40 MG tablet Take 40 mg by mouth daily.       Marland Kitchen amLODipine (NORVASC) 10 MG tablet Take 10 mg by mouth daily.       Marland Kitchen labetalol (NORMODYNE) 100 MG tablet Take 100 mg by mouth 2 (two) times daily.         Results for orders placed during the hospital encounter of 08/21/11 (from the past 48 hour(s))  BASIC METABOLIC PANEL     Status: Abnormal   Collection Time   08/21/11  8:45 AM      Component Value Range Comment   Sodium 139  135 - 145 (mEq/L)    Potassium 4.0  3.5 - 5.1 (mEq/L)    Chloride 103  96 - 112 (mEq/L)    CO2 23  19 - 32 (mEq/L)  Glucose, Bld 110 (*) 70 - 99 (mg/dL)    BUN 14  6 - 23 (mg/dL)    Creatinine, Ser 1.61  0.50 - 1.35 (mg/dL)    Calcium 9.5  8.4 - 10.5 (mg/dL)    GFR calc non Af Amer >90  >90 (mL/min)    GFR calc Af Amer >90  >90 (mL/min)   CBC     Status: Normal   Collection Time   08/21/11  8:45 AM      Component Value Range Comment   WBC 10.0  4.0 - 10.5 (K/uL)    RBC 5.40  4.22 - 5.81 (MIL/uL)    Hemoglobin 16.1  13.0 - 17.0 (g/dL)    HCT 09.6  04.5 - 40.9 (%)    MCV 85.6  78.0 - 100.0 (fL)    MCH 29.8  26.0 - 34.0 (pg)    MCHC 34.8  30.0 - 36.0 (g/dL)    RDW 81.1  91.4 - 78.2 (%)    Platelets 295  150 - 400 (K/uL)    No results found.  Review of Systems  Constitutional: Negative for fever, chills, weight loss, malaise/fatigue and diaphoresis.  HENT: Negative for hearing loss, nosebleeds, congestion and neck pain.   Respiratory: Positive for shortness of breath.   Cardiovascular: Negative for chest pain,  palpitations, orthopnea and claudication.  Gastrointestinal: Positive for heartburn and nausea. Negative for vomiting, abdominal pain, diarrhea, constipation, blood in stool and melena.  Genitourinary: Negative for urgency, frequency and hematuria.  Musculoskeletal: Positive for joint pain. Negative for myalgias.  Skin: Negative for [place].  Neurological: Positive for headaches. Negative for dizziness, tingling, tremors and weakness.  Endo/Heme/Allergies: Does not bruise/bleed easily.  Psychiatric/Behavioral: Negative for suicidal ideas, hallucinations, memory loss and substance abuse. The patient is nervous/anxious. The patient does not have insomnia.     Blood pressure 219/134, temperature 98.2 F (36.8 C), temperature source Oral, resp. rate 14, height 5\' 7"  (1.702 m), weight 90.719 kg (200 lb). Physical Exam  Constitutional: He is oriented to person, place, and time. He appears well-developed and well-nourished.  HENT:  Head: Normocephalic and atraumatic.  Mouth/Throat: No oropharyngeal exudate.  Eyes: Pupils are equal, round, and reactive to light. Left eye exhibits no discharge. No scleral icterus.  Neck: Normal range of motion. Neck supple. No JVD present. No tracheal deviation present. No thyromegaly present.  Cardiovascular: Normal rate, regular rhythm, normal heart sounds and intact distal pulses.   Respiratory: Effort normal and breath sounds normal.  GI: Soft. Bowel sounds are normal. He exhibits no distension. There is no tenderness. There is no rebound and no guarding.  Musculoskeletal: Normal range of motion. He exhibits no edema and no tenderness.  Lymphadenopathy:    He has no cervical adenopathy.  Neurological: He is alert and oriented to person, place, and time.  Skin: Skin is warm and dry.  Psychiatric: He has a normal mood and affect. His behavior is normal.     Assessment/Plan  Food impaction: Will proceed with an EGD at this time and make further recommendations  thereafter.   Tearra Ouk 08/21/2011, 11:54 AM

## 2011-08-21 NOTE — ED Notes (Signed)
Pt attempted again to drink water. Obstruction still present.

## 2011-08-21 NOTE — ED Provider Notes (Addendum)
History     CSN: [id number] Arrival date & time: 08/21/2011  8:34 AM   First MD Initiated Contact with Patient 08/21/11 [phone]      Chief Complaint  Patient presents with  . Foreign Body    (Consider location/radiation/quality/duration/timing/severity/associated sxs/prior treatment) The history is provided by the patient.  pt w hx esophageal ring, presents w food bolus/turkey being stuck in esophagus since last pm. Dull, constant fb sensation in mid to distal esophagus. States saliva and any attempt at drinking water refluxes back up a few minutes later. Hx same. States prior endoscopy for same last year. No chest pain or sob. No fever or chills.   Past Medical History  Diagnosis Date  . Hypertension     long standing since 2007, was treated for sometime, but then discontinued taking meds on his own.  . Pulmonary edema, acute     hospitalized in 09/2009 with hypertensive crisis and acute pulmonary edema- improved with lasix nad labetalol.  . History of tobacco abuse     History reviewed. No pertinent past surgical history.  No family history on file.  History  Substance Use Topics  . Smoking status: Former Smoker -- 1.0 packs/day for 40 years    Types: Cigarettes    Quit date: 10/01/2009  . Smokeless tobacco: Not on file  . Alcohol Use: No     drank 4 beers daily until hospitalization in 09/2009. Now does not drink any alcohol.      Review of Systems  Constitutional: Negative for fever.  HENT: Negative for neck pain.   Eyes: Negative for pain.  Respiratory: Negative for shortness of breath.   Cardiovascular: Negative for chest pain.  Gastrointestinal: Negative for abdominal pain.  Genitourinary: Negative for flank pain.  Musculoskeletal: Negative for back pain.  Skin: Negative for [place].  Neurological: Negative for headaches.  Hematological: Does not bruise/bleed easily.  Psychiatric/Behavioral: Negative for behavioral problems.    Allergies  Review of  patient's allergies indicates no known allergies.  Home Medications   Current Outpatient Rx  Name Route Sig Dispense Refill  . AMLODIPINE BESYLATE 10 MG PO TABS Oral Take 10 mg by mouth daily.      . FUROSEMIDE 40 MG PO TABS Oral Take 40 mg by mouth daily.      Marland Kitchen LABETALOL HCL 100 MG PO TABS Oral Take 100 mg by mouth 2 (two) times daily.      Marland Kitchen POTASSIUM CHLORIDE 20 MEQ PO PACK Oral Take 20 mEq by mouth daily.      Marland Kitchen ZOLPIDEM TARTRATE 5 MG PO TABS Oral Take 5 mg by mouth at bedtime as needed.        BP 214/129  Pulse 104  Temp(Src) 98.4 F (36.9 C) (Oral)  Resp 18  SpO2 98%  Physical Exam  Nursing note and vitals reviewed. Constitutional: He is oriented to person, place, and time. He appears well-developed and well-nourished. No distress.  HENT:  Head: Atraumatic.  Eyes: Pupils are equal, round, and reactive to light.  Neck: Neck supple. No tracheal deviation present.  Cardiovascular: Normal rate, regular rhythm, normal heart sounds and intact distal pulses.   Pulmonary/Chest: Effort normal. No accessory muscle usage. No respiratory distress. He has rales.  Abdominal: Soft. He exhibits no distension. There is no tenderness.  Musculoskeletal: He exhibits no edema.  Neurological: He is alert and oriented to person, place, and time.  Skin: Skin is warm and dry.  Psychiatric: He has a normal mood and affect.  ED Course  Procedures (including critical care time)  Labs Reviewed  BASIC METABOLIC PANEL - Abnormal; Notable for the following:    Glucose, Bld 110 (*)    All other components within normal limits  CBC   No results found. Results for orders placed during the hospital encounter of 08/21/11  BASIC METABOLIC PANEL      Component Value Range   Sodium 139  135 - 145 (mEq/L)   Potassium 4.0  3.5 - 5.1 (mEq/L)   Chloride 103  96 - 112 (mEq/L)   CO2 23  19 - 32 (mEq/L)   Glucose, Bld 110 (*) 70 - 99 (mg/dL)   BUN 14  6 - 23 (mg/dL)   Creatinine, Ser 9.14  0.50 -  1.35 (mg/dL)   Calcium 9.5  8.4 - 78.2 (mg/dL)   GFR calc non Af Amer >90  >90 (mL/min)   GFR calc Af Amer >90  >90 (mL/min)  CBC      Component Value Range   WBC 10.0  4.0 - 10.5 (K/uL)   RBC 5.40  4.22 - 5.81 (MIL/uL)   Hemoglobin 16.1  13.0 - 17.0 (g/dL)   HCT 95.6  21.3 - 08.6 (%)   MCV 85.6  78.0 - 100.0 (fL)   MCH 29.8  26.0 - 34.0 (pg)   MCHC 34.8  30.0 - 36.0 (g/dL)   RDW 57.8  46.9 - 62.9 (%)   Platelets 295  150 - 400 (K/uL)   No results found.    1. Esophageal foreign body   2. Hypertension       MDM  Pt requests we try to give 'muscle relaxer' before committing to EGD.  Iv ns. Glucagon iv, valium iv. Labs.   Reviewed e chart and nursing notes.   Trial water. Pt refluxes back up within approx 1 minutes time. Gi called.   Recheck bp high. Pt states he has been out of his bp meds for 2-3 months. Denies headache. No chest pain or sob.   labetolol iv.   [facility] indicated lengthy time period since seen by them and never followed up in office, therefore pt unassigned from gi standpoint  On call gi called-  Discussed w dr Loreta Ave, requests transfer to [facility], she will see there.   Discussed w Dr Oletta Lamas at [facility], and that pt being transferred to see Dr Loreta Ave for esophageal FB, and also that has uncontrolled bp/non compliance w bp meds, and that if remains significantly hypertensive gi md may end up wanting them to eval/rx.   Suzi Roots, MD 08/21/11 1006  Suzi Roots, MD 08/21/11 1017

## 2011-08-21 NOTE — ED Notes (Signed)
Pt attempted to drink water. Obstruction remains.
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
## 2011-08-21 NOTE — ED Notes (Signed)
Pt sts he has been out of his BP meds for "a couple of months".

## 2011-08-21 NOTE — Progress Notes (Signed)
Pt's b/p 223/151 on [facility]. Called and reported to Dr. Charna Elizabeth who ordered labetalol 5mg  IV. Given. Macy Mis Rn.

## 2011-08-21 NOTE — ED Notes (Signed)
Patient was in endoscopy for food lodged in his esophagus.  Wife states scope was performed successfully, but that patient is not compliant with his bp meds.  Patient's bp is high now, received three doses total of labetalol, as well as fentanyl and versed, valium and glucagon.  Pt. Is  Quiet, but easily roused to full orientation.

## 2011-08-21 NOTE — Brief Op Note (Signed)
Notes in [facility]

## 2011-08-24 ENCOUNTER — Encounter (HOSPITAL_COMMUNITY): Payer: Self-pay

## 2011-08-25 ENCOUNTER — Encounter (HOSPITAL_COMMUNITY): Payer: Self-pay | Admitting: Gastroenterology

## 2011-10-07 NOTE — Op Note (Signed)
[facility] [address] [place], [place]  [address]  OPERATIVE PROCEDURE REPORT  PATIENT:  Matthew Lee, Matthew Lee  MR#:  [id number] BIRTHDATE:  08-Feb-1956  GENDER:  male ENDOSCOPIST:  Dr.Jyothi Anastasio Auerbach ASSISTANT:  Judithann Sauger & Delorse Lek, RN & Crystal A. Clewis, Pensions consultant.  PROCEDURE DATE:  08/21/2011 PRE-PROCEDURE PREPERATION:  Patient fasted for 8 hours prior to the procedure. PRE-PROCEDURE PHYSICAL:  Patient has stable vital signs. Neck is supple. There is no JVD, thyromegaly or LAD. Chest clear to auscultation. S1 and S2 regular.Abdomen soft, obese, non-distended, non-tender with NABS. PROCEDURE:  EGD with foreign body removal. ASA CLASS:  Class III INDICATIONS:  1) Meat impaction 2) GERD. MEDICATIONS:  Fentanyl 150 mcg & Versed 15 mg IV. TOPICAL ANESTHETIC:  None used.  DESCRIPTION OF PROCEDURE: After the risks benefits and alternatives of the procedure were thoroughly explained, informed consent was obtained. The Pentax video gastroscope [id number] was introduced through the mouth and advanced to the stomach, with limitations. The instrument was slowly withdrawn as the mucosa was fully examined. <<PROCEDUREIMAGES>>  There was a large meal bolus impated at the GEJ that broken into smaller pieces with snare and a four proged retriever. The regular biopsy was used to gently push the bolus into the stomach. The patient sustained a mucosal tear during the procedure as it was hard to sedate him and he was very combative and agitatd. Grade IV distal esophagitis was noted. Moderate diffuse gastritis was noted as well. The stomach and the proximal small bowel were not ompletely evaluated due to debrs in the stomach. Multiple antral erosions were noted as well. ared normal. There were no ulcers, masses or polyps noted. Retroflexed views revealed no abnormalities. The scope was then withdrawn from the patient and the procedure terminated. The patient tolerated the  procedure without immediate complications.  IMPRESSION:  1) Large meat bolus impacted at the GEJ-removed [see description above]. 2) GradeIV distal esophagitis. 3) Mucosal tear at GEJ-probably due to trauma. 4) Antral erosions.  RECOMMENDATIONS:  1) Avoid all NSAIDS for now. 2) Anti-reflux regimen to be followed. 3) PPI QAM-Prilosec 20 mg 2 PO BID. 4) Soft low residue diet for now. 5) See PCP ASAP for BP medications. 6) OP follow-up in 2 weeks.  REPEAT EXAM:  In 4 weeks.  DISCHARGE INSTRUCTIONS: Standard discharge instructions given.  ______________________________ Dr.Jyothi Anastasio Auerbach  CPT CODES: 19147-82  DIAGNOSIS CODES:  935.2, 530.81, 530.11  n. eSIGNED:   Dr.Jyothi Anastasio Auerbach at 10/07/2011 10:08 AM  [place], Baldo Ash [phone]

## 2012-08-16 ENCOUNTER — Emergency Department (HOSPITAL_BASED_OUTPATIENT_CLINIC_OR_DEPARTMENT_OTHER)
Admission: EM | Admit: 2012-08-16 | Discharge: 2012-08-16 | Disposition: A | Discharge status: Home / Self Care | Payer: BC Managed Care – PPO | Attending: Emergency Medicine | Admitting: Emergency Medicine

## 2012-08-16 ENCOUNTER — Encounter (HOSPITAL_BASED_OUTPATIENT_CLINIC_OR_DEPARTMENT_OTHER): Payer: Self-pay

## 2012-08-16 DIAGNOSIS — I10 Essential (primary) hypertension: Secondary | ICD-10-CM

## 2012-08-16 DIAGNOSIS — Z87891 Personal history of nicotine dependence: Secondary | ICD-10-CM | POA: Insufficient documentation

## 2012-08-16 DIAGNOSIS — J810 Acute pulmonary edema: Secondary | ICD-10-CM | POA: Insufficient documentation

## 2012-08-16 DIAGNOSIS — Z79899 Other long term (current) drug therapy: Secondary | ICD-10-CM | POA: Insufficient documentation

## 2012-08-16 DIAGNOSIS — R609 Edema, unspecified: Secondary | ICD-10-CM | POA: Insufficient documentation

## 2012-08-16 LAB — CBC WITH DIFFERENTIAL/PLATELET
Basophils Relative: 1 % (ref 0–1)
HCT: 45.7 % (ref 39.0–52.0)
Hemoglobin: 15.9 g/dL (ref 13.0–17.0)
Lymphocytes Relative: 30 % (ref 12–46)
Lymphs Abs: 2.2 10*3/uL (ref 0.7–4.0)
MCHC: 34.8 g/dL (ref 30.0–36.0)
Monocytes Absolute: 1.1 10*3/uL — ABNORMAL HIGH (ref 0.1–1.0)
Monocytes Relative: 15 % — ABNORMAL HIGH (ref 3–12)
Neutro Abs: 3.7 10*3/uL (ref 1.7–7.7)
RBC: 5.61 MIL/uL (ref 4.22–5.81)

## 2012-08-16 LAB — COMPREHENSIVE METABOLIC PANEL
Albumin: 3.7 g/dL (ref 3.5–5.2)
Alkaline Phosphatase: 84 U/L (ref 39–117)
BUN: 17 mg/dL (ref 6–23)
Calcium: 9.6 mg/dL (ref 8.4–10.5)
Potassium: 4.4 mEq/L (ref 3.5–5.1)
Sodium: 135 mEq/L (ref 135–145)
Total Protein: 8.8 g/dL — ABNORMAL HIGH (ref 6.0–8.3)

## 2012-08-16 LAB — LIPASE, BLOOD: Lipase: 51 U/L (ref 11–59)

## 2012-08-16 MED ORDER — SODIUM CHLORIDE 0.9 % IV SOLN
Freq: Once | INTRAVENOUS | Status: AC
  Administered 2012-08-16: 20:00:00 via INTRAVENOUS

## 2012-08-16 MED ORDER — AMLODIPINE BESYLATE 10 MG PO TABS: 10.0000 mg | ORAL_TABLET | Freq: Every day | ORAL | Status: DC

## 2012-08-16 MED ORDER — FUROSEMIDE 20 MG PO TABS: 20.0000 mg | ORAL_TABLET | Freq: Two times a day (BID) | ORAL | Status: DC

## 2012-08-16 MED ORDER — METOPROLOL TARTRATE 1 MG/ML IV SOLN
5.0000 mg | Freq: Once | INTRAVENOUS | Status: AC
  Administered 2012-08-16: 5 mg via INTRAVENOUS
  Filled 2012-08-16: qty 5

## 2012-08-16 MED ORDER — LABETALOL HCL 100 MG PO TABS: 100.0000 mg | ORAL_TABLET | Freq: Two times a day (BID) | ORAL | Status: DC

## 2012-08-16 NOTE — ED Provider Notes (Signed)
History     CSN: [id number]  Arrival date & time 08/16/12  4098   First MD Initiated Contact with Patient 08/16/12 1857      Chief Complaint  Patient presents with  . Abdominal Pain    (Consider location/radiation/quality/duration/timing/severity/associated sxs/prior treatment) HPI 2 week hx of abdominal swelling and peripheral edema.  Hx of ETOH abuse Is off his htn medication.Denies fever or chills.  No vomiting or diarrhea.   Past Medical History  Diagnosis Date  . Hypertension     long standing since 2007, was treated for sometime, but then discontinued taking meds on his own.  . Pulmonary edema, acute     hospitalized in 09/2009 with hypertensive crisis and acute pulmonary edema- improved with lasix nad labetalol.  . History of tobacco abuse   . Shortness of breath     on exertion  . Headache     Past Surgical History  Procedure Date  . Tonsillectomy   . Esophagogastroduodenoscopy 08/21/2011    Procedure: ESOPHAGOGASTRODUODENOSCOPY (EGD);  Surgeon: Charna Elizabeth, MD;  Location: [facility] ENDOSCOPY;  Service: Endoscopy;  Laterality: N/A;    No family history on file.  History  Substance Use Topics  . Smoking status: Former Smoker -- 1.0 packs/day for 40 years    Types: Cigarettes    Quit date: 10/01/2009  . Smokeless tobacco: Not on file  . Alcohol Use: No     Comment: hx of heavy ETOH abuse      Review of Systems  All other systems reviewed and are negative.    Allergies  Review of patient's allergies indicates no known allergies.  Home Medications   Current Outpatient Rx  Name  Route  Sig  Dispense  Refill  . AMLODIPINE BESYLATE 10 MG PO TABS   Oral   Take 1 tablet (10 mg total) by mouth daily.   30 tablet   0   . AMLODIPINE BESYLATE 10 MG PO TABS   Oral   Take 1 tablet (10 mg total) by mouth daily.   30 tablet   0   . FUROSEMIDE 20 MG PO TABS   Oral   Take 1 tablet (20 mg total) by mouth 2 (two) times daily.   30 tablet   0   . LABETALOL  HCL 100 MG PO TABS   Oral   Take 1 tablet (100 mg total) by mouth 2 (two) times daily.   60 tablet   0   . LABETALOL HCL 100 MG PO TABS   Oral   Take 1 tablet (100 mg total) by mouth 2 (two) times daily.   60 tablet   0     BP 202/105  Pulse 62  Temp 98.8 F (37.1 C) (Oral)  Resp 16  Ht 5\' 7"  (1.702 m)  Wt 169 lb 4 oz (76.771 kg)  BMI 26.51 kg/m2  SpO2 97%  Physical Exam  Nursing note and vitals reviewed. Constitutional: He is oriented to person, place, and time. He appears well-developed and well-nourished. No distress.  HENT:  Head: Normocephalic and atraumatic.  Eyes: Pupils are equal, round, and reactive to light.  Neck: Normal range of motion.  Cardiovascular: Normal rate and intact distal pulses.   Pulmonary/Chest: No respiratory distress.  Abdominal: Normal appearance. He exhibits no distension. There is no tenderness. There is no rebound.  Musculoskeletal: Normal range of motion. He exhibits edema (bilateral lower extremity).  Neurological: He is alert and oriented to person, place, and time. No cranial nerve deficit.  Skin: Skin is warm and dry. No Study noted.  Psychiatric: He has a normal mood and affect. His behavior is normal.    ED Course  Procedures (including critical care time)  Labs Reviewed  COMPREHENSIVE METABOLIC PANEL - Abnormal; Notable for the following:    Glucose, Bld 100 (*)     Total Protein 8.8 (*)     AST 39 (*)     ALT 64 (*)     GFR calc non Af Amer 81 (*)     All other components within normal limits  CBC WITH DIFFERENTIAL - Abnormal; Notable for the following:    Monocytes Relative 15 (*)     Monocytes Absolute 1.1 (*)     All other components within normal limits  LIPASE, BLOOD   No results found.   1. Hypertension   2. Peripheral edema       MDM          Nelia Shi, MD 08/18/12 2239

## 2012-08-16 NOTE — ED Notes (Signed)
abd pain,swelling x 2 weeks

## 2012-08-22 ENCOUNTER — Ambulatory Visit (HOSPITAL_BASED_OUTPATIENT_CLINIC_OR_DEPARTMENT_OTHER)
Admission: RE | Admit: 2012-08-22 | Discharge: 2012-08-22 | Disposition: A | Discharge status: Home / Self Care | Payer: BC Managed Care – PPO | Source: Ambulatory Visit | Attending: Family | Admitting: Family

## 2012-08-22 ENCOUNTER — Ambulatory Visit (INDEPENDENT_AMBULATORY_CARE_PROVIDER_SITE_OTHER): Payer: BC Managed Care – PPO | Admitting: Family

## 2012-08-22 ENCOUNTER — Encounter: Payer: Self-pay | Admitting: Family

## 2012-08-22 VITALS — BP 126/90 | HR 71 | Temp 97.8°F | Resp 16 | Ht 67.0 in | Wt 166.0 lb

## 2012-08-22 DIAGNOSIS — R7989 Other specified abnormal findings of blood chemistry: Secondary | ICD-10-CM | POA: Insufficient documentation

## 2012-08-22 DIAGNOSIS — I10 Essential (primary) hypertension: Secondary | ICD-10-CM

## 2012-08-22 DIAGNOSIS — I519 Heart disease, unspecified: Secondary | ICD-10-CM

## 2012-08-22 DIAGNOSIS — G4700 Insomnia, unspecified: Secondary | ICD-10-CM

## 2012-08-22 DIAGNOSIS — R609 Edema, unspecified: Secondary | ICD-10-CM

## 2012-08-22 DIAGNOSIS — K8020 Calculus of gallbladder without cholecystitis without obstruction: Secondary | ICD-10-CM | POA: Insufficient documentation

## 2012-08-22 DIAGNOSIS — R188 Other ascites: Secondary | ICD-10-CM

## 2012-08-22 HISTORY — DX: Heart disease, unspecified: I51.9

## 2012-08-22 MED ORDER — LABETALOL HCL 100 MG PO TABS: 100.0000 mg | ORAL_TABLET | Freq: Two times a day (BID) | ORAL | Status: DC

## 2012-08-22 MED ORDER — AMLODIPINE BESYLATE 10 MG PO TABS: 10.0000 mg | ORAL_TABLET | Freq: Every day | ORAL | Status: DC

## 2012-08-22 MED ORDER — FUROSEMIDE 20 MG PO TABS: 20.0000 mg | ORAL_TABLET | Freq: Two times a day (BID) | ORAL | Status: DC

## 2012-08-22 MED ORDER — ZOLPIDEM TARTRATE 5 MG PO TABS: 5.0000 mg | ORAL_TABLET | Freq: Every evening | ORAL | Status: DC | PRN

## 2012-08-22 NOTE — Assessment & Plan Note (Signed)
>>  ASSESSMENT AND PLAN FOR ESSENTIAL HYPERTENSION WRITTEN ON 08/22/2012  1:24 PM BY O'SULLIVAN, Geneen Dieter, NP  BP looks good on current doses of amlodipine  and furosemide . Continue same.

## 2012-08-22 NOTE — Assessment & Plan Note (Signed)
Hx of alcohol abuse, though he has recently discontinued his use of alcohol completely and I commended him for this. He reports some symptomatic improvement since addition of furosemide.  Will obtain abdominal US, hepatitis studies, PT/INR to further evaluate liver function given elevated transaminases, ascites. Repeat bmet today to assess renal function and electrolytes on furosemide.

## 2012-08-22 NOTE — Assessment & Plan Note (Addendum)
Grade 1 diastolic dysfunction per review of 2D echo 2010.  Given recent complaints of LE edema/ascites, will repeat 2-D echo to re-evaluate. Obtain baseline CXR as well. EKG performed today notes NSR with changes of LVH.

## 2012-08-22 NOTE — Assessment & Plan Note (Signed)
He is requesting rx for ambien.  Rx provided.  Pt instructed to use sparingly for short term.

## 2012-08-22 NOTE — Assessment & Plan Note (Signed)
BP looks good on current doses of amlodipine and furosemide. Continue same.

## 2012-08-22 NOTE — Progress Notes (Signed)
Subjective:    Patient ID: Matthew Lee, male    DOB: 1956-06-28, 56 y.o.   MRN: [id number]  HPI  "Matthew Lee" is a 56 yr old male who presents today to establish care.  He was seen in the ED on 11/19 for peripheral edema and furosemide was started. He was seen again on 11/23 due to HTN and his BP meds were refilled.  ED records are reviewed.   HTN- diagnosed in 2007.  Has not seen a PCP recently.    Hyperglycemia- denies polyuria, or polydipsia. Sugar was slightly elevated in ED.   Elevated LFT's-  Noted in ED record.    Reports that he quit smoking 2/10.    Hx of Alcohol abuse- quit 1 month ago.  4-6 beers a day.    Review of Systems  Constitutional:       Some weight loss  HENT: Negative for hearing loss.   Eyes:       Reports some vision issue recently.  Reports near sighted.  Will arrange eye exam.  Respiratory: Negative for shortness of breath.   Cardiovascular: Negative for chest pain.  Gastrointestinal: Positive for constipation. Negative for diarrhea and blood in stool.  Genitourinary: Negative for dysuria and frequency.  Musculoskeletal: Negative for myalgias and arthralgias.  Skin: Negative for Heidelberger.  Neurological: Negative for headaches.  Hematological: Does not bruise/bleed easily.  Psychiatric/Behavioral:       Denies depression/anxiety.         Past Medical History  Diagnosis Date  . Hypertension     long standing since 2007, was treated for sometime, but then discontinued taking meds on his own.  . Pulmonary edema, acute     hospitalized in 09/2009 with hypertensive crisis and acute pulmonary edema- improved with lasix nad labetalol.  . History of tobacco abuse   . Shortness of breath     on exertion  . Headache     History   Social History  . Marital Status: Married    Spouse Name: N/A    Number of Children: N/A  . Years of Education: N/A   Occupational History  . 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    Social History Main Topics  .  Smoking status: Former Smoker -- 1.0 packs/day for 40 years    Types: Cigarettes    Quit date: 09/23/2009  . Smokeless tobacco: Not on file  . Alcohol Use: No     Comment: hx of heavy ETOH abuse  . Drug Use: No  . Sexually Active: Not on file   Other Topics Concern  . 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    Past Surgical History  Procedure Date  . Tonsillectomy   . Esophagogastroduodenoscopy 08/21/2011    Procedure: ESOPHAGOGASTRODUODENOSCOPY (EGD);  Surgeon: Charna Elizabeth, MD;  Location: [facility];  Service: Endoscopy;  Laterality: N/A;    Family History  Problem Relation Age of Onset  . Polycystic kidney disease Father     No Known Allergies  Current Outpatient Prescriptions on File Prior to Visit  Medication Sig Dispense Refill  . [DISCONTINUED] amLODipine (NORVASC) 10 MG tablet Take 1 tablet (10 mg total) by mouth daily.  30 tablet  0  . [DISCONTINUED] furosemide (LASIX) 20 MG tablet Take 1 tablet (20 mg total) by mouth 2 (two) times daily.  30  tablet  0  . [DISCONTINUED] labetalol (NORMODYNE) 100 MG tablet Take 1 tablet (100 mg total) by mouth 2 (two) times daily.  60 tablet  0  . zolpidem (AMBIEN) 5 MG tablet Take 1 tablet (5 mg total) by mouth at bedtime as needed for sleep.  15 tablet  0  . [DISCONTINUED] amLODipine (NORVASC) 10 MG tablet Take 1 tablet (10 mg total) by mouth daily.  30 tablet  0  . [DISCONTINUED] labetalol (NORMODYNE) 100 MG tablet Take 1 tablet (100 mg total) by mouth 2 (two) times daily.  60 tablet  0    BP 126/90  Pulse 71  Temp 97.8 F (36.6 C) (Oral)  Resp 16  Ht 5\' 7"  (1.702 m)  Wt 166 lb (75.297 kg)  BMI 26.00 kg/m2  SpO2 97%    Objective:   Physical Exam  Constitutional: He is oriented to person, place, and time. He appears well-developed and well-nourished. No  distress.  HENT:  Head: Normocephalic and atraumatic.  Mouth/Throat: No oropharyngeal exudate.  Neck: Normal range of motion. Neck supple.  Cardiovascular: Normal rate and regular rhythm.   No murmur heard. Pulmonary/Chest: Effort normal and breath sounds normal. No respiratory distress. He has no wheezes. He has no rales. He exhibits no tenderness.  Abdominal: Soft. Bowel sounds are normal. There is no guarding.       + fluid wave in abdomen, (ascites) however abdomen remains soft.   Suspect mild hepatomegaly with firm liver edge.  Musculoskeletal: He exhibits no edema.  Lymphadenopathy:    He has no cervical adenopathy.  Neurological: He is alert and oriented to person, place, and time.  Skin: Skin is warm.  Psychiatric: He has a normal mood and affect. His behavior is normal. Judgment and thought content normal.          Assessment & Plan:  Mild hyperglycemia- plan to check A1C next visit.

## 2012-08-22 NOTE — Patient Instructions (Addendum)
Please complete your blood work prior to leaving. Please complete your chest x ray on the first floor. You will be contacted about scheduling your echocardiogram to check your heart. Please let us know if you have not heard back within 1 week about your referral. Follow up in 1 month.  Welcome to [facility]!

## 2012-08-23 ENCOUNTER — Telehealth: Payer: Self-pay | Admitting: Family

## 2012-08-23 LAB — HEPATITIS B SURFACE ANTIBODY,QUALITATIVE: Hep B S Ab: NEGATIVE

## 2012-08-23 LAB — PROTIME-INR
INR: 1.03 (ref <1.50)
Prothrombin Time: 13.5 seconds (ref 11.6–15.2)

## 2012-08-23 LAB — HEPATITIS C ANTIBODY: HCV Ab: REACTIVE — AB

## 2012-08-23 LAB — HEPATITIS B SURFACE ANTIGEN: Hepatitis B Surface Ag: NEGATIVE

## 2012-08-23 NOTE — Telephone Encounter (Signed)
Attempted to reach pt on work phone- was told by woman on line that I had called the wrong number.  Tried home number- no answer, unable to leave message.

## 2012-08-24 ENCOUNTER — Encounter: Payer: Self-pay | Admitting: Family

## 2012-08-24 ENCOUNTER — Telehealth: Payer: Self-pay | Admitting: Family

## 2012-08-24 DIAGNOSIS — B1920 Unspecified viral hepatitis C without hepatic coma: Secondary | ICD-10-CM | POA: Insufficient documentation

## 2012-08-24 HISTORY — DX: Unspecified viral hepatitis C without hepatic coma: B19.20

## 2012-08-24 NOTE — Telephone Encounter (Signed)
Attempted to reach patient on home number. No answer.  Unable to leave message.

## 2012-08-28 ENCOUNTER — Encounter: Payer: Self-pay | Admitting: Family

## 2012-08-28 DIAGNOSIS — R739 Hyperglycemia, unspecified: Secondary | ICD-10-CM

## 2012-08-28 DIAGNOSIS — R768 Other specified abnormal immunological findings in serum: Secondary | ICD-10-CM

## 2012-08-29 NOTE — Telephone Encounter (Signed)
Patient will come to the lab this week for the below listed tests please.

## 2012-08-30 ENCOUNTER — Encounter: Payer: Self-pay | Admitting: Family

## 2012-08-30 DIAGNOSIS — K2970 Gastritis, unspecified, without bleeding: Secondary | ICD-10-CM

## 2012-08-30 NOTE — Telephone Encounter (Signed)
Pt will return to lab tomorrow for labs as below.  Thanks.

## 2012-08-31 ENCOUNTER — Encounter: Payer: Self-pay | Admitting: Family

## 2012-08-31 ENCOUNTER — Other Ambulatory Visit: Payer: Self-pay | Admitting: Family

## 2012-08-31 ENCOUNTER — Ambulatory Visit (HOSPITAL_BASED_OUTPATIENT_CLINIC_OR_DEPARTMENT_OTHER)
Admission: RE | Admit: 2012-08-31 | Discharge: 2012-08-31 | Disposition: A | Discharge status: Home / Self Care | Payer: BC Managed Care – PPO | Source: Ambulatory Visit | Attending: Family | Admitting: Family

## 2012-08-31 DIAGNOSIS — I519 Heart disease, unspecified: Secondary | ICD-10-CM

## 2012-08-31 DIAGNOSIS — B1920 Unspecified viral hepatitis C without hepatic coma: Secondary | ICD-10-CM | POA: Insufficient documentation

## 2012-08-31 DIAGNOSIS — G4700 Insomnia, unspecified: Secondary | ICD-10-CM | POA: Insufficient documentation

## 2012-08-31 DIAGNOSIS — R609 Edema, unspecified: Secondary | ICD-10-CM

## 2012-08-31 DIAGNOSIS — Z87891 Personal history of nicotine dependence: Secondary | ICD-10-CM | POA: Insufficient documentation

## 2012-08-31 DIAGNOSIS — R188 Other ascites: Secondary | ICD-10-CM | POA: Insufficient documentation

## 2012-08-31 DIAGNOSIS — R894 Abnormal immunological findings in specimens from other organs, systems and tissues: Secondary | ICD-10-CM

## 2012-08-31 DIAGNOSIS — I059 Rheumatic mitral valve disease, unspecified: Secondary | ICD-10-CM | POA: Insufficient documentation

## 2012-08-31 DIAGNOSIS — I5030 Unspecified diastolic (congestive) heart failure: Secondary | ICD-10-CM | POA: Insufficient documentation

## 2012-08-31 DIAGNOSIS — I517 Cardiomegaly: Secondary | ICD-10-CM | POA: Insufficient documentation

## 2012-08-31 DIAGNOSIS — I10 Essential (primary) hypertension: Secondary | ICD-10-CM | POA: Insufficient documentation

## 2012-08-31 LAB — HEMOGLOBIN A1C: Hgb A1c MFr Bld: 5.9 % — ABNORMAL HIGH (ref <5.7)

## 2012-08-31 NOTE — Progress Notes (Signed)
  Echocardiogram 2D Echocardiogram has been performed.  Matthew Lee 08/31/2012, 9:15 AM

## 2012-09-01 ENCOUNTER — Encounter: Payer: Self-pay | Admitting: Family

## 2012-09-02 ENCOUNTER — Encounter: Payer: Self-pay | Admitting: Family

## 2012-09-02 NOTE — Telephone Encounter (Signed)
Matthew Lee- it sounds like he has a lot of concerns.  Lets call him and try to see him back early next week instead of 2 weeks out. Thanks.

## 2012-09-05 ENCOUNTER — Encounter: Payer: Self-pay | Admitting: *Deleted

## 2012-09-06 ENCOUNTER — Encounter: Payer: Self-pay | Admitting: Family

## 2012-09-06 ENCOUNTER — Encounter (HOSPITAL_COMMUNITY): Payer: Self-pay | Admitting: *Deleted

## 2012-09-06 ENCOUNTER — Emergency Department (HOSPITAL_COMMUNITY)
Admission: EM | Admit: 2012-09-06 | Discharge: 2012-09-06 | Discharge status: Against Medical Advice | Payer: BC Managed Care – PPO | Attending: Emergency Medicine | Admitting: Emergency Medicine

## 2012-09-06 DIAGNOSIS — Z8619 Personal history of other infectious and parasitic diseases: Secondary | ICD-10-CM | POA: Insufficient documentation

## 2012-09-06 DIAGNOSIS — F1010 Alcohol abuse, uncomplicated: Secondary | ICD-10-CM | POA: Insufficient documentation

## 2012-09-06 DIAGNOSIS — R109 Unspecified abdominal pain: Secondary | ICD-10-CM | POA: Insufficient documentation

## 2012-09-06 LAB — CBC WITH DIFFERENTIAL/PLATELET
Basophils Absolute: 0 K/uL (ref 0.0–0.1)
Basophils Relative: 0 % (ref 0–1)
Eosinophils Absolute: 0.4 K/uL (ref 0.0–0.7)
Eosinophils Relative: 5 % (ref 0–5)
HCT: 43.4 % (ref 39.0–52.0)
Hemoglobin: 15.1 g/dL (ref 13.0–17.0)
Lymphocytes Relative: 39 % (ref 12–46)
Lymphs Abs: 3.5 K/uL (ref 0.7–4.0)
MCH: 28.9 pg (ref 26.0–34.0)
MCHC: 34.8 g/dL (ref 30.0–36.0)
MCV: 83 fL (ref 78.0–100.0)
Monocytes Absolute: 0.9 K/uL (ref 0.1–1.0)
Monocytes Relative: 10 % (ref 3–12)
Neutro Abs: 4.1 K/uL (ref 1.7–7.7)
Neutrophils Relative %: 46 % (ref 43–77)
Platelets: 254 K/uL (ref 150–400)
RBC: 5.23 MIL/uL (ref 4.22–5.81)
RDW: 13.5 % (ref 11.5–15.5)
WBC: 9 K/uL (ref 4.0–10.5)

## 2012-09-06 LAB — TROPONIN I: Troponin I: <0.3 ng/mL

## 2012-09-06 LAB — COMPREHENSIVE METABOLIC PANEL
AST: 55 U/L — ABNORMAL HIGH (ref 0–37)
Albumin: 3.9 g/dL (ref 3.5–5.2)
BUN: 19 mg/dL (ref 6–23)
CO2: 25 mEq/L (ref 19–32)
Calcium: 9.8 mg/dL (ref 8.4–10.5)
Chloride: 104 mEq/L (ref 96–112)
Creatinine, Ser: 0.94 mg/dL (ref 0.50–1.35)
GFR calc non Af Amer: >90 mL/min (ref >90)
Total Bilirubin: 0.4 mg/dL (ref 0.3–1.2)

## 2012-09-06 LAB — LIPASE, BLOOD: Lipase: 54 U/L (ref 11–59)

## 2012-09-06 NOTE — ED Notes (Signed)
abd pain since October.  Since then he has been diagnosed with hep b and he drinks every day .  The las t alcohol was 3 days ago.  No n or v

## 2012-09-07 ENCOUNTER — Other Ambulatory Visit: Payer: Self-pay | Admitting: Family

## 2012-09-07 ENCOUNTER — Ambulatory Visit: Payer: BC Managed Care – PPO | Admitting: Internal Medicine

## 2012-09-07 ENCOUNTER — Telehealth: Payer: Self-pay | Admitting: *Deleted

## 2012-09-07 MED ORDER — ZOLPIDEM TARTRATE 5 MG PO TABS: 5.0000 mg | ORAL_TABLET | Freq: Every evening | ORAL | Status: DC | PRN

## 2012-09-07 NOTE — Telephone Encounter (Signed)
Refill left on pharmacy voicemail, #[phone] x no refills.

## 2012-09-07 NOTE — Telephone Encounter (Signed)
I recommend that he be evaluated this evening in Urgent Care or ED tonight if recurrent abdominal pain or if he feels badly like he did last night. Otherwise, please see if Dr. Rodena Medin can see him tomorrow AM.

## 2012-09-07 NOTE — Telephone Encounter (Signed)
Attempted call back and got no answer

## 2012-09-07 NOTE — Telephone Encounter (Signed)
Discuss with patient, Pt schedule for appt in AM but advise if symptoms return or worsen he need to be seen in UC/ED prior to appt. Pt ok and verbalized understanding

## 2012-09-07 NOTE — Telephone Encounter (Signed)
Pt went to ED on yesterday for abdominal pain, light headed, skin pale, lips turning blue, fatigue. Pt left the ED prior to getting treatment due to waiting 3 hours without seeing anyone. Pt denies any SOB, chest pain, numbness,or tingling in legs. Pt is requesting appt for tomorrow no avaiable times. Pt notes that BP on yesterday at ED was 185/135. Pt is feeling ok now but would like to be evaluated. Pt indicated that he call this am and has yet to hear back from [facility].Please advise

## 2012-09-08 ENCOUNTER — Telehealth: Payer: Self-pay | Admitting: *Deleted

## 2012-09-08 ENCOUNTER — Ambulatory Visit: Payer: BC Managed Care – PPO | Admitting: Internal Medicine

## 2012-09-08 NOTE — Telephone Encounter (Signed)
Spoke to pt re: appt. Today at 9:15am with Dr Rodena Medin for abdominal pain. Advised pt that he already has an appointment scheduled with Dr Loreta Ave (GI) at 10:15 today. Advised pt that he should keep appt with GI today and we will cancel appt with Dr Rodena Medin. Pt voices understanding.

## 2012-09-13 ENCOUNTER — Ambulatory Visit: Payer: BC Managed Care – PPO | Admitting: Family

## 2012-09-15 ENCOUNTER — Encounter: Payer: Self-pay | Admitting: Family

## 2012-09-15 DIAGNOSIS — B1920 Unspecified viral hepatitis C without hepatic coma: Secondary | ICD-10-CM

## 2012-09-19 ENCOUNTER — Encounter: Payer: Self-pay | Admitting: Family

## 2012-09-19 ENCOUNTER — Ambulatory Visit (INDEPENDENT_AMBULATORY_CARE_PROVIDER_SITE_OTHER): Payer: BC Managed Care – PPO | Admitting: Family

## 2012-09-19 VITALS — BP 120/82 | HR 76 | Temp 99.4°F | Resp 16 | Wt 165.0 lb

## 2012-09-19 DIAGNOSIS — I10 Essential (primary) hypertension: Secondary | ICD-10-CM

## 2012-09-19 DIAGNOSIS — G4700 Insomnia, unspecified: Secondary | ICD-10-CM

## 2012-09-19 DIAGNOSIS — R609 Edema, unspecified: Secondary | ICD-10-CM

## 2012-09-19 DIAGNOSIS — B1920 Unspecified viral hepatitis C without hepatic coma: Secondary | ICD-10-CM

## 2012-09-19 LAB — BASIC METABOLIC PANEL
BUN: 18 mg/dL (ref 6–23)
Chloride: 102 mEq/L (ref 96–112)
Glucose, Bld: 75 mg/dL (ref 70–99)
Potassium: 4.6 mEq/L (ref 3.5–5.3)
Sodium: 136 mEq/L (ref 135–145)

## 2012-09-19 MED ORDER — AMITRIPTYLINE HCL 25 MG PO TABS: 25.0000 mg | ORAL_TABLET | Freq: Every day | ORAL | Status: DC

## 2012-09-19 NOTE — Patient Instructions (Addendum)
Please complete your blood work prior to leaving.  Please schedule a follow up appointment in 2 months.

## 2012-09-19 NOTE — Progress Notes (Signed)
Subjective:    Patient ID: Matthew Lee, male    DOB: 08/05/1956, 56 y.o.   MRN: [id number]  HPI  Matthew Lee is a 56 yr old male who presents today for follow up.  1) HTN- he is currently on labetalol and furosemide.  Notes improvement in LE edema and abdominal bloating with addition of furosemide.    2) Hepatitis C-  Was noted to be hep C + with a + viral load.  He has met with Dr. Loreta Ave. He is scheduled for colo and endo.  He has also been referred to the hepatology clinic at [facility].  He is waiting to hear back about his appointment.    3) Insomnia- Trying not to use ambien every night.  Wants to know what else he can use. Benadryl has not helped him.     Review of Systems Past Medical History  Diagnosis Date  . Hypertension     long standing since 2007, was treated for sometime, but then discontinued taking meds on his own.  . Pulmonary edema, acute     hospitalized in 09/2009 with hypertensive crisis and acute pulmonary edema- improved with lasix nad labetalol.  . History of tobacco abuse   . Shortness of breath     on exertion  . Headache   . Hepatitis C 08/24/2012    History   Social History  . Marital Status: Married    Spouse Name: N/A    Number of Children: N/A  . Years of Education: N/A   Occupational History  . 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    Social History Main Topics  . Smoking status: Former Smoker -- 1.0 packs/day for 40 years    Types: Cigarettes    Quit date: 09/23/2009  . Smokeless tobacco: Not on file  . Alcohol Use: No     Comment: hx of heavy ETOH abuse  . Drug Use: No  . Sexually Active: Not on file   Other Topics Concern  . 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    Past Surgical History  Procedure Date  . Tonsillectomy   . Esophagogastroduodenoscopy 08/21/2011     Procedure: ESOPHAGOGASTRODUODENOSCOPY (EGD);  Surgeon: Charna Elizabeth, MD;  Location: [facility];  Service: Endoscopy;  Laterality: N/A;    Family History  Problem Relation Age of Onset  . Polycystic kidney disease Father     No Known Allergies  Current Outpatient Prescriptions on File Prior to Visit  Medication Sig Dispense Refill  . amLODipine (NORVASC) 10 MG tablet Take 1 tablet (10 mg total) by mouth daily.  30 tablet  2  . furosemide (LASIX) 20 MG tablet Take 1 tablet (20 mg total) by mouth 2 (two) times daily.  60 tablet  2  . labetalol (NORMODYNE) 100 MG tablet Take 1 tablet (100 mg total) by mouth 2 (two) times daily.  60 tablet  2  . NEXIUM 40 MG capsule Take 40 mg by mouth daily.      Marland Kitchen zolpidem (AMBIEN) 5 MG tablet Take 1 tablet (5 mg total) by mouth at bedtime as needed for sleep.  15 tablet  0  . amitriptyline (ELAVIL) 25 MG tablet Take 1 tablet (25 mg total) by mouth at bedtime.  30 tablet  2    BP 120/82  Pulse 76  Temp 99.4  F (37.4 C) (Oral)  Resp 16  Wt 165 lb 0.6 oz (74.862 kg)  SpO2 96%       Objective:   Physical Exam  Constitutional: He is oriented to person, place, and time. He appears well-developed and well-nourished. No distress.  Cardiovascular: Normal rate and regular rhythm.   No murmur heard. Pulmonary/Chest: Effort normal and breath sounds normal. No respiratory distress. He has no wheezes. He has no rales. He exhibits no tenderness.  Abdominal: Soft. Bowel sounds are normal. He exhibits no distension and no mass. There is no tenderness. There is no rebound and no guarding.  Musculoskeletal: He exhibits no edema.  Neurological: He is alert and oriented to person, place, and time.  Psychiatric: He has a normal mood and affect.          Assessment & Plan:

## 2012-09-20 LAB — HEPATITIS B SURFACE ANTIBODY,QUALITATIVE: Hep B S Ab: NONREACTIVE

## 2012-09-20 LAB — HEPATITIS A ANTIBODY, IGM: Hep A IgM: NEGATIVE

## 2012-09-20 NOTE — Assessment & Plan Note (Signed)
BP looks good today on amlodipine, labetalol and furosemide.  Continue same.  Obtain bmet.

## 2012-09-20 NOTE — Assessment & Plan Note (Signed)
Newly diagnosed.  Referred to [facility] for treatment.  Endo/colo to be performed by Dr. Loreta Ave.  Obtain hep A/B serologies.  If not immune, will plan to vaccinate.

## 2012-09-20 NOTE — Assessment & Plan Note (Signed)
Trial of Elavil HS. Reserve [place] for sparing use.

## 2012-09-20 NOTE — Assessment & Plan Note (Signed)
>>  ASSESSMENT AND PLAN FOR ESSENTIAL HYPERTENSION WRITTEN ON 09/20/2012  7:37 AM BY O'SULLIVAN, Stepahnie Campo, NP  BP looks good today on amlodipine , labetalol  and furosemide .  Continue same.  Obtain bmet.

## 2012-09-23 ENCOUNTER — Telehealth: Payer: Self-pay | Admitting: *Deleted

## 2012-09-23 ENCOUNTER — Encounter: Payer: Self-pay | Admitting: Family

## 2012-09-23 NOTE — Telephone Encounter (Signed)
Message copied by Regis Bill on Fri Sep 23, 2012  9:46 AM ------      Message from: O'SULLIVAN, MELISSA      Created: Fri Sep 23, 2012  9:21 AM       Review of his lab work shows that he does not have immunity to hepatitis A or hepatitis B.  He should receive vaccination for these.   I would like him to complete the Twinrix series with an injection now, at 1 month and at 6 months.

## 2012-09-23 NOTE — Telephone Encounter (Signed)
LMOM with contact name & number for return call RE: results & further provider instructions/SLS 

## 2012-09-25 ENCOUNTER — Encounter: Payer: Self-pay | Admitting: Family

## 2012-09-26 ENCOUNTER — Telehealth: Payer: Self-pay | Admitting: *Deleted

## 2012-09-26 NOTE — Telephone Encounter (Signed)
His last two readings in clinic have been 120/82 and 126/90. If this is a single reading and no associated sx's then recommend twice a day bp log and report results after 2-3  days

## 2012-09-26 NOTE — Telephone Encounter (Signed)
Received call from pt stating his current BP reading is 174/107. Pt states he checked his BP because he was having pressure behind his ribs and thinks this is from possible ascites. (See previous pt email) He denies chest pain, n/v, shortness of breath, changes in vision, extremity weakness, headache and speech or change in mental status. Pt reports compliance with medications; no missed or skipped doses. Advised pt if he has any of the above symptoms to be evaluated in the ER.  Please advise.

## 2012-09-26 NOTE — Telephone Encounter (Signed)
Notified pt and he voices understanding. He reports that his BP is now 140/93.

## 2012-10-05 ENCOUNTER — Encounter: Payer: Self-pay | Admitting: *Deleted

## 2012-10-12 ENCOUNTER — Telehealth: Payer: Self-pay | Admitting: Family

## 2012-10-12 ENCOUNTER — Ambulatory Visit (INDEPENDENT_AMBULATORY_CARE_PROVIDER_SITE_OTHER): Payer: BC Managed Care – PPO | Admitting: Cardiology

## 2012-10-12 ENCOUNTER — Encounter: Payer: Self-pay | Admitting: Cardiology

## 2012-10-12 VITALS — BP 124/82 | HR 78 | Ht 67.0 in | Wt 175.0 lb

## 2012-10-12 DIAGNOSIS — I10 Essential (primary) hypertension: Secondary | ICD-10-CM

## 2012-10-12 DIAGNOSIS — I519 Heart disease, unspecified: Secondary | ICD-10-CM

## 2012-10-12 DIAGNOSIS — R188 Other ascites: Secondary | ICD-10-CM

## 2012-10-12 MED ORDER — AMLODIPINE BESYLATE 10 MG PO TABS: 10.0000 mg | ORAL_TABLET | Freq: Every day | ORAL | Status: DC

## 2012-10-12 MED ORDER — LABETALOL HCL 100 MG PO TABS: 100.0000 mg | ORAL_TABLET | Freq: Two times a day (BID) | ORAL | Status: DC

## 2012-10-12 MED ORDER — FUROSEMIDE 20 MG PO TABS: 20.0000 mg | ORAL_TABLET | Freq: Two times a day (BID) | ORAL | Status: DC

## 2012-10-12 MED ORDER — AMITRIPTYLINE HCL 25 MG PO TABS
25.0000 mg | ORAL_TABLET | Freq: Every day | ORAL | Status: DC
Start: 2012-10-12 — End: 2013-03-17

## 2012-10-12 MED ORDER — ESOMEPRAZOLE MAGNESIUM 40 MG PO CPDR: 40.0000 mg | DELAYED_RELEASE_CAPSULE | Freq: Every day | ORAL | Status: DC

## 2012-10-12 NOTE — Telephone Encounter (Signed)
OK 

## 2012-10-12 NOTE — Assessment & Plan Note (Signed)
>>  ASSESSMENT AND PLAN FOR ESSENTIAL HYPERTENSION WRITTEN ON 10/12/2012 10:20 AM BY CRENSHAW, Deannie Fabian, MD  Blood pressure controlled. Continue present medications.

## 2012-10-12 NOTE — Assessment & Plan Note (Signed)
Patient presents for evaluation of edema. However his complaint is predominantly ascites. I do not find any peripheral edema and his neck veins are not elevated. His LV function is normal and his recent BNP was normal. I think this is most likely related to his hepatitis C or other abdominal process. I have recommended that he followup with gastroenterology. He will continue on his present dose of Lasix which is being managed by primary care.

## 2012-10-12 NOTE — Progress Notes (Signed)
HPI: 57 year old male for evaluation of edema. Echocardiogram in December of 2013 showed an ejection fraction of 50-55%. There was grade 1 diastolic dysfunction. There was normal LV filling pressures. There was severe left atrial enlargement, mild right atrial and right ventricular enlargement and mild mitral regurgitation. Laboratories in Dec 2013 showed normal renal function and an albumin of 3.9. Hgb 15.1. BNP in November of 2013 was 35. Patient states that last fall he noticed increased abdominal swelling and pain. Also mild bilateral lower extremity edema. He was not taking his blood pressure medications or his diuretic at that point. He resumed those and his blood pressure and pedal edema improved. However his ascites continued. He denies dyspnea on exertion, orthopnea, PND, pedal edema, chest pain or syncope. He is seeing gastroenterology for hepatitis C and ascites.  Current Outpatient Prescriptions  Medication Sig Dispense Refill  . amitriptyline (ELAVIL) 25 MG tablet Take 1 tablet (25 mg total) by mouth at bedtime.  30 tablet  2  . amLODipine (NORVASC) 10 MG tablet Take 1 tablet (10 mg total) by mouth daily.  30 tablet  2  . furosemide (LASIX) 20 MG tablet Take 1 tablet (20 mg total) by mouth 2 (two) times daily.  60 tablet  2  . labetalol (NORMODYNE) 100 MG tablet Take 1 tablet (100 mg total) by mouth 2 (two) times daily.  60 tablet  2  . NEXIUM 40 MG capsule Take 40 mg by mouth daily.        No Known Allergies  Past Medical History  Diagnosis Date  . Hypertension     long standing since 2007, was treated for sometime, but then discontinued taking meds on his own.  . Pulmonary edema, acute     hospitalized in 09/2009 with hypertensive crisis and acute pulmonary edema- improved with lasix nad labetalol.  . History of tobacco abuse   . Headache   . Hepatitis C 08/24/2012    Past Surgical History  Procedure Date  . Tonsillectomy   . Esophagogastroduodenoscopy 08/21/2011   Procedure: ESOPHAGOGASTRODUODENOSCOPY (EGD);  Surgeon: Charna Elizabeth, MD;  Location: [facility];  Service: Endoscopy;  Laterality: N/A;    History   Social History  . Marital Status: Married    Spouse Name: N/A    Number of Children: N/A  . Years of Education: N/A   Occupational History  . 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 Smoking status: Former Smoker -- 1.0 packs/day for 40 years    Types: Cigarettes    Quit date: 09/23/2009  . Smokeless tobacco: Not on file  . Alcohol Use: No     Comment: hx of heavy ETOH abuse  . Drug Use: No  . Sexually Active: Not on file   Other Topics Concern  . 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    Family History  Problem Relation Age of Onset  . Polycystic kidney disease Father     ROS: Ascites and abdominal pain but no fevers or chills, productive cough, hemoptysis, dysphasia, odynophagia, melena, hematochezia, dysuria, hematuria, Krausz, seizure activity, orthopnea, PND, pedal edema, claudication. Remaining systems are negative.  Physical Exam:   Blood pressure 124/82, pulse 78, height 5\' 7"  (1.702 m), weight 175 lb (79.379 kg).  General:  Well developed/well nourished in  NAD Skin warm/dry Patient not depressed No peripheral clubbing Back-normal HEENT-normal/normal eyelids Neck supple/normal carotid upstroke bilaterally; no bruits; no JVD; no thyromegaly chest - CTA/ normal expansion CV - RRR/normal S1 and S2; no murmurs, rubs or gallops;  PMI nondisplaced Abdomen -NT/ND, no HSM, no mass, + bowel sounds, no bruit, there is a fluid wave noted. 2+ femoral pulses, no bruits Ext-no edema, chords, 2+ DP Neuro-grossly nonfocal  ECG 08/22/2012-sinus rhythm, left ventricular hypertrophy with repolarization  abnormality.

## 2012-10-12 NOTE — Telephone Encounter (Signed)
Please advise if ok to give 3 month supply of amitriptyline?

## 2012-10-12 NOTE — Telephone Encounter (Signed)
Refills sent, notified pt. 

## 2012-10-12 NOTE — Telephone Encounter (Signed)
Patient states that he would like refills of all medications to be sent to [facility]. He states that he has a week left on some medications before he runs out.

## 2012-10-12 NOTE — Assessment & Plan Note (Signed)
Continue present dose of Lasix. 

## 2012-10-12 NOTE — Patient Instructions (Addendum)
Your physician recommends that you schedule a follow-up appointment in: AS NEEDED  

## 2012-10-12 NOTE — Assessment & Plan Note (Signed)
Blood pressure controlled. Continue present medications. 

## 2013-03-14 ENCOUNTER — Encounter: Payer: Self-pay | Admitting: Family

## 2013-03-15 ENCOUNTER — Ambulatory Visit (INDEPENDENT_AMBULATORY_CARE_PROVIDER_SITE_OTHER): Payer: BC Managed Care – PPO

## 2013-03-15 DIAGNOSIS — Z23 Encounter for immunization: Secondary | ICD-10-CM

## 2013-03-17 ENCOUNTER — Other Ambulatory Visit: Payer: Self-pay | Admitting: Family

## 2013-03-17 NOTE — Telephone Encounter (Signed)
Rx request to pharmacy per [facility] refill protocol/SLS

## 2013-05-26 ENCOUNTER — Other Ambulatory Visit: Payer: Self-pay | Admitting: Family

## 2013-05-26 NOTE — Telephone Encounter (Signed)
No answer, no voicemail.  Will try again later.

## 2013-05-26 NOTE — Telephone Encounter (Signed)
Amitriptyline request from [facility] denied as pt is past due for appt. Last seen 08/2012 and advised f/u in 2 months. Please call pt to arrange follow up and ask if he needs [place] to send a 2 week supply to local pharmacy.

## 2013-05-27 ENCOUNTER — Other Ambulatory Visit: Payer: Self-pay | Admitting: Family

## 2013-05-28 NOTE — Telephone Encounter (Signed)
Past due for follow up. Ok to send 30 day supply to local pharm but needs ov prior to additional refills.

## 2013-05-30 NOTE — Telephone Encounter (Signed)
Spoke to patients wife and informed her that refill for amitriptyline has been denied. That patient needs to be seen before refill can be given. Patients wife states that she will have patient call to schedule an appointment. She also did not know if he wanted two week supply sent to pharmacy.

## 2013-06-01 MED ORDER — FUROSEMIDE 20 MG PO TABS: ORAL_TABLET | ORAL | Status: DC

## 2013-06-01 MED ORDER — AMLODIPINE BESYLATE 10 MG PO TABS: ORAL_TABLET | ORAL | Status: DC

## 2013-06-01 MED ORDER — ESOMEPRAZOLE MAGNESIUM 40 MG PO CPDR: DELAYED_RELEASE_CAPSULE | ORAL | Status: DC

## 2013-06-01 MED ORDER — LABETALOL HCL 100 MG PO TABS: ORAL_TABLET | ORAL | Status: DC

## 2013-06-01 NOTE — Telephone Encounter (Signed)
Patient aware and will be calling to make appointment.

## 2013-06-05 ENCOUNTER — Encounter: Payer: Self-pay | Admitting: Family

## 2013-06-05 ENCOUNTER — Ambulatory Visit (INDEPENDENT_AMBULATORY_CARE_PROVIDER_SITE_OTHER): Payer: BC Managed Care – PPO | Admitting: Family

## 2013-06-05 VITALS — BP 132/90 | HR 80 | Temp 97.8°F | Resp 16 | Ht 67.0 in | Wt 192.1 lb

## 2013-06-05 DIAGNOSIS — Z23 Encounter for immunization: Secondary | ICD-10-CM

## 2013-06-05 DIAGNOSIS — I10 Essential (primary) hypertension: Secondary | ICD-10-CM

## 2013-06-05 DIAGNOSIS — R635 Abnormal weight gain: Secondary | ICD-10-CM

## 2013-06-05 DIAGNOSIS — G4700 Insomnia, unspecified: Secondary | ICD-10-CM

## 2013-06-05 DIAGNOSIS — B1920 Unspecified viral hepatitis C without hepatic coma: Secondary | ICD-10-CM

## 2013-06-05 LAB — HEPATIC FUNCTION PANEL
AST: 30 U/L (ref 0–37)
Bilirubin, Direct: 0.1 mg/dL (ref 0.0–0.3)
Total Bilirubin: 0.6 mg/dL (ref 0.3–1.2)

## 2013-06-05 LAB — BASIC METABOLIC PANEL
CO2: 29 mEq/L (ref 19–32)
Calcium: 9.5 mg/dL (ref 8.4–10.5)
Creat: 1.11 mg/dL (ref 0.50–1.35)

## 2013-06-05 LAB — TSH: TSH: 0.896 u[IU]/mL (ref 0.350–4.500)

## 2013-06-05 MED ORDER — AMITRIPTYLINE HCL 25 MG PO TABS: ORAL_TABLET | ORAL | Status: DC

## 2013-06-05 MED ORDER — FUROSEMIDE 20 MG PO TABS: ORAL_TABLET | ORAL | Status: DC

## 2013-06-05 MED ORDER — LABETALOL HCL 100 MG PO TABS: ORAL_TABLET | ORAL | Status: DC

## 2013-06-05 MED ORDER — AMLODIPINE BESYLATE 10 MG PO TABS: ORAL_TABLET | ORAL | Status: DC

## 2013-06-05 NOTE — Progress Notes (Signed)
  Subjective:    Patient ID: Matthew Lee, male    DOB: Oct 28, 1955, 57 y.o.   MRN: [id number]  HPI  [name] is a 57 yr old male who presents today for follow up.  He was last seen in December.    He does report some weight gain- tries to walk every day during the week.  Reports that he overeats.  Hep C- reports that he did not follow through with the referral to [facility]- thought it was too far.  Insomnia- reports sleeping well on elavil. Thinks this may have improved his mood as well.  HTN- continues amlodipine and furosemide.  Review of Systems    He denies CP/SOB or swelling  Past Medical History  Diagnosis Date  . Hypertension     long standing since 2007, was treated for sometime, but then discontinued taking meds on his own.  . Pulmonary edema, acute     hospitalized in 09/2009 with hypertensive crisis and acute pulmonary edema- improved with lasix nad labetalol.  . History of tobacco abuse   . Headache(784.0)   . Hepatitis C 08/24/2012    History   Social History  . Marital Status: Married    Spouse Name: N/A    Number of Children: N/A  . Years of Education: N/A   Occupational History  . 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 Smoking status: Former Smoker -- 1.00 packs/day for 40 years    Types: Cigarettes    Quit date: 09/23/2009  . Smokeless tobacco: Not on file  . Alcohol Use: No     Comment: hx of heavy ETOH abuse  . Drug Use: No  . Sexual Activity: Not on file   Other Topics Concern  . Not on file   Social History Narrative   Lives at home with wife, daughter and her 2 children.   Caffeine use:  1 daily   Works a Aeronautical engineer (Museum/gallery conservator)   Enjoys Optician, dispensing, Civil engineer, contracting.          Past Surgical History  Procedure Laterality Date  . Tonsillectomy    . Esophagogastroduodenoscopy  08/21/2011    Procedure: ESOPHAGOGASTRODUODENOSCOPY (EGD);  Surgeon: Charna Elizabeth, MD;   Location: [facility];  Service: Endoscopy;  Laterality: N/A;    Family History  Problem Relation Age of Onset  . Polycystic kidney disease Father     No Known Allergies  No current outpatient prescriptions on file prior to visit.   No current facility-administered medications on file prior to visit.    BP 132/90  Pulse 80  Temp(Src) 97.8 F (36.6 C) (Oral)  Resp 16  Ht 5\' 7"  (1.702 m)  Wt 192 lb 1.9 oz (87.145 kg)  BMI 30.08 kg/m2  SpO2 97%    Objective:   Physical Exam  Constitutional: He is oriented to person, place, and time. He appears well-developed and well-nourished. No distress.  Cardiovascular: Normal rate and regular rhythm.   No murmur heard. Pulmonary/Chest: Effort normal and breath sounds normal. No respiratory distress. He has no wheezes. He has no rales. He exhibits no tenderness.  Neurological: He is alert and oriented to person, place, and time.  Psychiatric: He has a normal mood and affect. His behavior is normal. Judgment and thought content normal.          Assessment & Plan:

## 2013-06-05 NOTE — Assessment & Plan Note (Signed)
>>  ASSESSMENT AND PLAN FOR ESSENTIAL HYPERTENSION WRITTEN ON 06/05/2013  9:23 AM BY O'SULLIVAN, Harshaan Whang, NP  Stable on amlodipine , labetalol  and furosemide . Obtain bmet, continue same.

## 2013-06-05 NOTE — Assessment & Plan Note (Signed)
Stable on amlodipine, labetalol and furosemide. Obtain bmet, continue same.

## 2013-06-05 NOTE — Assessment & Plan Note (Addendum)
Did not follow through with [facility] referral.  Will refer to [facility]- Dr. Wyvonnia Lora. He is agreeable to this referral and thinks this will be more convenient for him.  Twinrix and flu shot today. obtain lft.

## 2013-06-05 NOTE — Assessment & Plan Note (Signed)
Improved on elavil. Continue same.

## 2013-06-05 NOTE — Patient Instructions (Addendum)
Please complete lab work prior to leaving. You will be contacted about your referral to Dr. Wyvonnia Lora for treatment of your hepatitis C.  Please let us know if you have not heard back within 1 week about your referral. Please follow up in 1 month for your second of three Twinrix (hep A and B vaccine). Follow up in in 3 months for office visit.

## 2013-06-05 NOTE — Assessment & Plan Note (Signed)
Check TSH 

## 2013-06-06 ENCOUNTER — Encounter: Payer: Self-pay | Admitting: Family

## 2013-07-05 ENCOUNTER — Ambulatory Visit (INDEPENDENT_AMBULATORY_CARE_PROVIDER_SITE_OTHER): Payer: BC Managed Care – PPO | Admitting: Family

## 2013-07-05 DIAGNOSIS — Z23 Encounter for immunization: Secondary | ICD-10-CM

## 2013-07-10 ENCOUNTER — Encounter: Payer: Self-pay | Admitting: Family

## 2013-09-04 ENCOUNTER — Ambulatory Visit (INDEPENDENT_AMBULATORY_CARE_PROVIDER_SITE_OTHER): Payer: BC Managed Care – PPO | Admitting: Family

## 2013-09-04 ENCOUNTER — Encounter: Payer: Self-pay | Admitting: Family

## 2013-09-04 ENCOUNTER — Ambulatory Visit: Payer: BC Managed Care – PPO | Admitting: Family

## 2013-09-04 VITALS — BP 128/82 | HR 84 | Temp 97.8°F | Resp 16 | Ht 67.0 in | Wt 192.0 lb

## 2013-09-04 DIAGNOSIS — K219 Gastro-esophageal reflux disease without esophagitis: Secondary | ICD-10-CM

## 2013-09-04 DIAGNOSIS — B1920 Unspecified viral hepatitis C without hepatic coma: Secondary | ICD-10-CM

## 2013-09-04 DIAGNOSIS — E669 Obesity, unspecified: Secondary | ICD-10-CM

## 2013-09-04 DIAGNOSIS — G4700 Insomnia, unspecified: Secondary | ICD-10-CM

## 2013-09-04 DIAGNOSIS — I10 Essential (primary) hypertension: Secondary | ICD-10-CM

## 2013-09-04 LAB — HEPATIC FUNCTION PANEL
AST: 39 U/L — ABNORMAL HIGH (ref 0–37)
Alkaline Phosphatase: 81 U/L (ref 39–117)
Bilirubin, Direct: 0.1 mg/dL (ref 0.0–0.3)
Indirect Bilirubin: 0.4 mg/dL (ref 0.0–0.9)
Total Bilirubin: 0.5 mg/dL (ref 0.3–1.2)

## 2013-09-04 LAB — BASIC METABOLIC PANEL WITH GFR
CO2: 28 mEq/L (ref 19–32)
Chloride: 103 mEq/L (ref 96–112)
Creat: 1.17 mg/dL (ref 0.50–1.35)
Sodium: 137 mEq/L (ref 135–145)

## 2013-09-04 NOTE — Progress Notes (Signed)
Subjective:    Patient ID: Matthew Lee, male    DOB: [birth year], 57 y.o.   MRN: [id number]  HPI  Matthew Lee is a 57 yr old male who presents today for follow up.  1) Weight gain-  Wt Readings from Last 3 Encounters:  09/04/13 192 lb (87.091 kg)  06/05/13 192 lb 1.9 oz (87.145 kg)  10/12/12 175 lb (79.379 kg)   2)  Hep C- did not follow through with appointment with Dr. Wyvonnia Lora.  3) Insomnia- reports sleeping well with elavil.    4) HTN-  BP Readings from Last 3 Encounters:  09/04/13 128/82  06/05/13 132/90  10/12/12 124/82  continues labetalol, amlodipine, furosemide. Denies CP/SOB or LE edema  5) GERD- stopped ppi briefly, symptoms worsened.  Resumed and symptoms are resolved.      Review of Systems See HPI  Past Medical History  Diagnosis Date  . Hypertension     long standing since 2007, was treated for sometime, but then discontinued taking meds on his own.  . Pulmonary edema, acute     hospitalized in 09/2009 with hypertensive crisis and acute pulmonary edema- improved with lasix nad labetalol.  . History of tobacco abuse   . Headache(784.0)   . Hepatitis C 08/24/2012    History   Social History  . Marital Status: Married    Spouse Name: N/A    Number of Children: N/A  . Years of Education: N/A   Occupational History  . 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 Smoking status: Former Smoker -- 1.00 packs/day for 40 years    Types: Cigarettes    Quit date: 09/23/2009  . Smokeless tobacco: Not on file  . Alcohol Use: No     Comment: hx of heavy ETOH abuse  . Drug Use: No  . Sexual Activity: Not on file   Other Topics Concern  . Not on file   Social History Narrative   Lives at home with wife, daughter and her 2 children.   Caffeine use:  1 daily   Works a Aeronautical engineer (Museum/gallery conservator)   Enjoys Optician, dispensing, Civil engineer, contracting.          Past Surgical History  Procedure Laterality  Date  . Tonsillectomy    . Esophagogastroduodenoscopy  08/21/2011    Procedure: ESOPHAGOGASTRODUODENOSCOPY (EGD);  Surgeon: Charna Elizabeth, MD;  Location: [facility];  Service: Endoscopy;  Laterality: N/A;    Family History  Problem Relation Age of Onset  . Polycystic kidney disease Father     No Known Allergies  Current Outpatient Prescriptions on File Prior to Visit  Medication Sig Dispense Refill  . amitriptyline (ELAVIL) 25 MG tablet TAKE 1 TABLET AT BEDTIME  90 tablet  1  . amLODipine (NORVASC) 10 MG tablet TAKE 1 TABLET DAILY  90 tablet  1  . furosemide (LASIX) 20 MG tablet TAKE 1 TABLET TWICE A DAY  180 tablet  1  . labetalol (NORMODYNE) 100 MG tablet TAKE 1 TABLET TWICE A DAY  180 tablet  1  . omeprazole (PRILOSEC) 20 MG capsule Take 20 mg by mouth daily.       No current facility-administered medications on file prior to visit.    BP 128/82  Pulse 84  Temp(Src) 97.8 F (36.6 C) (Oral)  Resp 16  Ht 5\' 7"  (1.702 m)  Wt 192 lb (87.091 kg)  BMI 30.06 kg/m2  Objective:   Physical Exam  Constitutional: He is oriented to person, place, and time. He appears well-developed and well-nourished. No distress.  HENT:  Head: Normocephalic and atraumatic.  Cardiovascular: Normal rate and regular rhythm.   No murmur heard. Pulmonary/Chest: Effort normal and breath sounds normal. No respiratory distress. He has no wheezes. He has no rales. He exhibits no tenderness.  Musculoskeletal: He exhibits no edema.  Neurological: He is alert and oriented to person, place, and time.  Psychiatric: He has a normal mood and affect. His behavior is normal. Thought content normal.          Assessment & Plan:

## 2013-09-04 NOTE — Patient Instructions (Signed)
Please reschedule consultation with Dr. Sharlene Motts-  [phone] Complete lab work prior to leaving. Follow up in 3 months.

## 2013-09-05 ENCOUNTER — Encounter: Payer: Self-pay | Admitting: Family

## 2013-09-06 NOTE — Telephone Encounter (Signed)
Opened in error

## 2013-09-07 DIAGNOSIS — K219 Gastro-esophageal reflux disease without esophagitis: Secondary | ICD-10-CM | POA: Insufficient documentation

## 2013-09-07 DIAGNOSIS — E669 Obesity, unspecified: Secondary | ICD-10-CM | POA: Insufficient documentation

## 2013-09-07 HISTORY — DX: Gastro-esophageal reflux disease without esophagitis: K21.9

## 2013-09-07 NOTE — Assessment & Plan Note (Addendum)
Advised pt on the importance of rescheduling office visit with Sharlene Motts to discuss hep C treatment options. Obtain LFT.

## 2013-09-07 NOTE — Assessment & Plan Note (Signed)
>>  ASSESSMENT AND PLAN FOR ESSENTIAL HYPERTENSION WRITTEN ON 09/07/2013  9:57 PM BY O'SULLIVAN, Fahmida Jurich, NP  BP stable on current meds.  Continue same. Obtain bmet.

## 2013-09-07 NOTE — Assessment & Plan Note (Signed)
BP stable on current meds. Continue same. Obtain bmet 

## 2013-09-07 NOTE — Assessment & Plan Note (Signed)
Weight stable since last visit. Discussed weight loss.

## 2013-09-07 NOTE — Assessment & Plan Note (Signed)
Stable with HS elavil. Continue same.

## 2013-09-07 NOTE — Assessment & Plan Note (Signed)
Did not tolerate discontinuation of PPI, continue PPI.

## 2013-10-25 ENCOUNTER — Other Ambulatory Visit: Payer: Self-pay | Admitting: Family

## 2013-12-04 ENCOUNTER — Ambulatory Visit (INDEPENDENT_AMBULATORY_CARE_PROVIDER_SITE_OTHER): Payer: BC Managed Care – PPO | Admitting: Family

## 2013-12-04 DIAGNOSIS — Z23 Encounter for immunization: Secondary | ICD-10-CM

## 2014-01-12 ENCOUNTER — Telehealth: Payer: Self-pay | Admitting: Family

## 2014-01-12 NOTE — Telephone Encounter (Signed)
labetelol  Furosemide  90 day supply to be sent to [facility]

## 2014-01-15 MED ORDER — FUROSEMIDE 20 MG PO TABS: ORAL_TABLET | ORAL | Status: DC

## 2014-01-15 MED ORDER — AMLODIPINE BESYLATE 10 MG PO TABS: ORAL_TABLET | ORAL | Status: DC

## 2014-01-15 MED ORDER — AMITRIPTYLINE HCL 25 MG PO TABS: ORAL_TABLET | ORAL | Status: DC

## 2014-01-15 MED ORDER — LABETALOL HCL 100 MG PO TABS: ORAL_TABLET | ORAL | Status: DC

## 2014-01-15 NOTE — Telephone Encounter (Signed)
Patient also needs maitripline 25 mg and amlodipine [facility] day supply

## 2014-01-15 NOTE — Telephone Encounter (Signed)
See below note.

## 2014-01-15 NOTE — Telephone Encounter (Signed)
90 day supply of each medication sent. Pt was due for follow up in March and is past due.  Please call pt to arrange appt.

## 2014-01-17 NOTE — Telephone Encounter (Signed)
Mailed letter to pt

## 2014-01-17 NOTE — Telephone Encounter (Signed)
Left detailed message with patient wife and she states that she will have patient call us to schedule appointment

## 2014-01-19 ENCOUNTER — Telehealth: Payer: Self-pay | Admitting: Family

## 2014-01-19 MED ORDER — LABETALOL HCL 100 MG PO TABS: ORAL_TABLET | ORAL | Status: DC

## 2014-01-19 NOTE — Telephone Encounter (Signed)
10 day supply sent to [facility], left detailed message on pt's home #.

## 2014-01-19 NOTE — Telephone Encounter (Signed)
Patient is out of labetalol and [facility] [address]

## 2014-01-24 ENCOUNTER — Ambulatory Visit (INDEPENDENT_AMBULATORY_CARE_PROVIDER_SITE_OTHER): Payer: BC Managed Care – PPO | Admitting: Family

## 2014-01-24 ENCOUNTER — Telehealth: Payer: Self-pay | Admitting: Family

## 2014-01-24 ENCOUNTER — Encounter: Payer: Self-pay | Admitting: Family

## 2014-01-24 VITALS — BP 170/102 | HR 76 | Temp 97.9°F | Resp 18 | Ht 67.0 in | Wt 198.0 lb

## 2014-01-24 DIAGNOSIS — R739 Hyperglycemia, unspecified: Secondary | ICD-10-CM

## 2014-01-24 DIAGNOSIS — B1920 Unspecified viral hepatitis C without hepatic coma: Secondary | ICD-10-CM

## 2014-01-24 DIAGNOSIS — R7309 Other abnormal glucose: Secondary | ICD-10-CM

## 2014-01-24 DIAGNOSIS — I10 Essential (primary) hypertension: Secondary | ICD-10-CM

## 2014-01-24 DIAGNOSIS — K219 Gastro-esophageal reflux disease without esophagitis: Secondary | ICD-10-CM

## 2014-01-24 LAB — HEPATIC FUNCTION PANEL
ALK PHOS: 66 U/L (ref 39–117)
ALT: 59 U/L — ABNORMAL HIGH (ref 0–53)
AST: 47 U/L — ABNORMAL HIGH (ref 0–37)
Albumin: 4.1 g/dL (ref 3.5–5.2)
BILIRUBIN DIRECT: 0.1 mg/dL (ref 0.0–0.3)
Indirect Bilirubin: 0.5 mg/dL (ref 0.2–1.2)
TOTAL PROTEIN: 7.5 g/dL (ref 6.0–8.3)
Total Bilirubin: 0.6 mg/dL (ref 0.2–1.2)

## 2014-01-24 LAB — BASIC METABOLIC PANEL WITH GFR
BUN: 16 mg/dL (ref 6–23)
CO2: 32 mEq/L (ref 19–32)
Calcium: 9.6 mg/dL (ref 8.4–10.5)
Chloride: 101 mEq/L (ref 96–112)
Creat: 0.94 mg/dL (ref 0.50–1.35)
GFR, Est African American: >89 mL/min
GFR, Est Non African American: 88 mL/min
Glucose, Bld: 95 mg/dL (ref 70–99)
Potassium: 4.6 mEq/L (ref 3.5–5.3)
Sodium: 138 mEq/L (ref 135–145)

## 2014-01-24 LAB — HEMOGLOBIN A1C
Hgb A1c MFr Bld: 5.9 % — ABNORMAL HIGH (ref <5.7)
Mean Plasma Glucose: 123 mg/dL — ABNORMAL HIGH (ref <117)

## 2014-01-24 NOTE — Assessment & Plan Note (Signed)
Advised pt on the importance of following through with referral for hep c treatment. He verbalizes understanding.

## 2014-01-24 NOTE — Assessment & Plan Note (Signed)
Uncontrolled. Resume amlodipine, continue hctz and labetalol.

## 2014-01-24 NOTE — Patient Instructions (Addendum)
Restart amlodipine. You will be contact about your referral to Dr. Wyvonnia LoraNunez for Hep C treatment.  Please let us know if you have not heard back within 1 week about your referral. It is very important that you follow through with this appointment. Please complete lab work prior to leaving.  Please follow up in 2 weeks so we can recheck your blood pressure.

## 2014-01-24 NOTE — Assessment & Plan Note (Signed)
>>  ASSESSMENT AND PLAN FOR ESSENTIAL HYPERTENSION WRITTEN ON 01/24/2014  8:48 AM BY O'SULLIVAN, Crisol Muecke, NP  Uncontrolled. Resume amlodipine , continue hctz and labetalol .

## 2014-01-24 NOTE — Progress Notes (Signed)
Subjective:    Patient ID: Matthew Lee, male    DOB: 01-15-1956, 58 y.o.   MRN: [id number]  HPI  Matthew Lee is a 58 yr old male who presents today for follow up.  1) HTN- reports that he took labetalol but did not take amlodipine.  He continues furosemide.  Denies LE edema.  2) GERD- not taking PPI.  Reports that symptoms are generally well controlled without PPI.   3) Hepatitis C- was referred to Matthew Lee. He completed twinrix series.     Review of Systems See HPI  Past Medical History  Diagnosis Date  . Hypertension     long standing since 2007, was treated for sometime, but then discontinued taking meds on his own.  . Pulmonary edema, acute     hospitalized in 09/2009 with hypertensive crisis and acute pulmonary edema- improved with lasix nad labetalol.  . History of tobacco abuse   . Headache(784.0)   . Hepatitis C 08/24/2012    History   Social History  . Marital Status: Married    Spouse Name: N/A    Number of Children: N/A  . Years of Education: N/A   Occupational History  . 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 Smoking status: Former Smoker -- 1.00 packs/day for 40 years    Types: Cigarettes    Quit date: 09/23/2009  . Smokeless tobacco: Not on file  . Alcohol Use: No     Comment: hx of heavy ETOH abuse  . Drug Use: No  . Sexual Activity: Not on file   Other Topics Concern  . Not on file   Social History Narrative   Lives at home with wife, daughter and her 2 children.   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          Past Surgical History  Procedure Laterality Date  . Tonsillectomy    . Esophagogastroduodenoscopy  08/21/2011    Procedure: ESOPHAGOGASTRODUODENOSCOPY (EGD);  Surgeon: Charna ElizabethJyothi Mann, MD;  Location: [facility];  Service: Endoscopy;  Laterality: N/A;    Family History  Problem Relation Age of  Onset  . Polycystic kidney disease Father     No Known Allergies  Current Outpatient Prescriptions on File Prior to Visit  Medication Sig Dispense Refill  . amitriptyline (ELAVIL) 25 MG tablet TAKE 1 TABLET AT BEDTIME  90 tablet  0  . amLODipine (NORVASC) 10 MG tablet TAKE 1 TABLET DAILY  90 tablet  0  . furosemide (LASIX) 20 MG tablet TAKE 1 TABLET TWICE A DAY  180 tablet  0  . labetalol (NORMODYNE) 100 MG tablet TAKE 1 TABLET TWICE A DAY  20 tablet  0  . omeprazole (PRILOSEC) 20 MG capsule Take 20 mg by mouth daily.       No current facility-administered medications on file prior to visit.    BP 170/102  Pulse 76  Temp(Src) 97.9 F (36.6 C) (Oral)  Resp 18  Ht 5\' 7"  (1.702 m)  Wt 198 lb (89.812 kg)  BMI 31.00 kg/m2  SpO2 98%       Objective:   Physical Exam  Constitutional: He is oriented to person, place, and time. He appears well-developed and well-nourished. No distress.  HENT:  Head: Normocephalic and atraumatic.  Cardiovascular: Normal rate and regular rhythm.   No murmur heard. Pulmonary/Chest:  Effort normal and breath sounds normal. No respiratory distress. He has no wheezes. He has no rales. He exhibits no tenderness.  Abdominal: Soft. Bowel sounds are normal.  Abdomen is protuberant but soft. No definite ascites.  Neurological: He is alert and oriented to person, place, and time.  Psychiatric: He has a normal mood and affect. His behavior is normal. Judgment and thought content normal.          Assessment & Plan:

## 2014-01-24 NOTE — Assessment & Plan Note (Signed)
Stable off PPI.  Monitor.  

## 2014-01-24 NOTE — Telephone Encounter (Signed)
Relevant patient education assigned to patient using Emmi. ° °

## 2014-01-25 ENCOUNTER — Encounter: Payer: Self-pay | Admitting: Family

## 2014-02-07 ENCOUNTER — Ambulatory Visit (INDEPENDENT_AMBULATORY_CARE_PROVIDER_SITE_OTHER): Payer: BC Managed Care – PPO | Admitting: Family

## 2014-02-07 ENCOUNTER — Encounter: Payer: Self-pay | Admitting: Family

## 2014-02-07 VITALS — BP 138/98 | HR 84 | Temp 98.3°F | Resp 18 | Ht 67.0 in | Wt 194.0 lb

## 2014-02-07 DIAGNOSIS — B1920 Unspecified viral hepatitis C without hepatic coma: Secondary | ICD-10-CM

## 2014-02-07 DIAGNOSIS — I10 Essential (primary) hypertension: Secondary | ICD-10-CM

## 2014-02-07 NOTE — Patient Instructions (Addendum)
Increase labetalol from 100mg  twice a day to 150mg  twice daily. Follow up in two weeks.

## 2014-02-07 NOTE — Assessment & Plan Note (Signed)
>>  ASSESSMENT AND PLAN FOR ESSENTIAL HYPERTENSION WRITTEN ON 02/07/2014  7:57 AM BY O'SULLIVAN, Dacen Frayre, NP  BP is improving. DBP remains above goal. Increase labetalol  from 100mg  bid to 150mg  bid.

## 2014-02-07 NOTE — Progress Notes (Signed)
Subjective:    Patient ID: Matthew Lee H Matthew Lee, male    DOB: 05-Aug-1956, 58 y.o.   MRN: [id number]  HPI  Matthew Lee is a 58 yr old male who presents today for follow up of his blood pressure. Denies CP or leg swelling.  Overall feeling better. Last visit BP was elevated.  He was instructed to resume amlodipine and to continue lasix and labetalol. BP Readings from Last 3 Encounters:  02/07/14 138/98  01/24/14 170/102  09/04/13 128/82   Hep C- pt reports that he is scheduled for consult at hep C clinic in July.  Review of Systems See HPI  Past Medical History  Diagnosis Date  . Hypertension     long standing since 2007, was treated for sometime, but then discontinued taking meds on his own.  . Pulmonary edema, acute     hospitalized in 09/2009 with hypertensive crisis and acute pulmonary edema- improved with lasix nad labetalol.  . History of tobacco abuse   . Headache(784.0)   . Hepatitis C 08/24/2012    History   Social History  . Marital Status: Married    Spouse Name: N/A    Number of Children: N/A  . Years of Education: N/A   Occupational History  . 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 Smoking status: Former Smoker -- 1.00 packs/day for 40 years    Types: Cigarettes    Quit date: 09/23/2009  . Smokeless tobacco: Not on file  . Alcohol Use: No     Comment: hx of heavy ETOH abuse  . Drug Use: No  . Sexual Activity: Not on file   Other Topics Concern  . Not on file   Social History Narrative   Lives at home with wife, daughter and her 2 children.   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          Past Surgical History  Procedure Laterality Date  . Tonsillectomy    . Esophagogastroduodenoscopy  08/21/2011    Procedure: ESOPHAGOGASTRODUODENOSCOPY (EGD);  Surgeon: Charna ElizabethJyothi Mann, MD;  Location: [facility];  Service: Endoscopy;   Laterality: N/A;    Family History  Problem Relation Age of Onset  . Polycystic kidney disease Father     No Known Allergies  Current Outpatient Prescriptions on File Prior to Visit  Medication Sig Dispense Refill  . amitriptyline (ELAVIL) 25 MG tablet TAKE 1 TABLET AT BEDTIME  90 tablet  0  . amLODipine (NORVASC) 10 MG tablet TAKE 1 TABLET DAILY  90 tablet  0  . furosemide (LASIX) 20 MG tablet TAKE 1 TABLET TWICE A DAY  180 tablet  0  . labetalol (NORMODYNE) 100 MG tablet TAKE 1 TABLET TWICE A DAY  20 tablet  0   No current facility-administered medications on file prior to visit.    BP 138/98  Pulse 84  Temp(Src) 98.3 F (36.8 C) (Oral)  Resp 18  Ht 5\' 7"  (1.702 m)  Wt 194 lb (87.998 kg)  BMI 30.38 kg/m2  SpO2 97%        Objective:   Physical Exam  Constitutional: He is oriented to person, place, and time. He appears well-developed and well-nourished. No distress.  HENT:  Head: Normocephalic and atraumatic.  Cardiovascular: Normal rate and regular rhythm.   No murmur heard. Pulmonary/Chest: Effort normal and breath sounds normal.  No respiratory distress. He has no wheezes. He has no rales. He exhibits no tenderness.  Neurological: He is alert and oriented to person, place, and time.  Psychiatric: He has a normal mood and affect. His behavior is normal. Judgment and thought content normal.          Assessment & Plan:

## 2014-02-07 NOTE — Progress Notes (Signed)
Pre visit review using our clinic review tool, if applicable. No additional management support is needed unless otherwise documented below in the visit note. 

## 2014-02-07 NOTE — Assessment & Plan Note (Signed)
Reinforced importance of keeping upcoming appointment.

## 2014-02-07 NOTE — Assessment & Plan Note (Signed)
BP is improving. DBP remains above goal. Increase labetalol from 100mg  bid to 150mg  bid.

## 2014-02-21 ENCOUNTER — Ambulatory Visit (INDEPENDENT_AMBULATORY_CARE_PROVIDER_SITE_OTHER): Payer: BC Managed Care – PPO | Admitting: Family

## 2014-02-21 ENCOUNTER — Encounter: Payer: Self-pay | Admitting: Family

## 2014-02-21 VITALS — BP 122/80 | HR 72 | Temp 98.1°F | Resp 16 | Ht 67.0 in | Wt 191.1 lb

## 2014-02-21 DIAGNOSIS — I10 Essential (primary) hypertension: Secondary | ICD-10-CM

## 2014-02-21 DIAGNOSIS — B1920 Unspecified viral hepatitis C without hepatic coma: Secondary | ICD-10-CM

## 2014-02-21 MED ORDER — FUROSEMIDE 20 MG PO TABS: ORAL_TABLET | ORAL | Status: DC

## 2014-02-21 MED ORDER — LABETALOL HCL 100 MG PO TABS: 150.0000 mg | ORAL_TABLET | Freq: Two times a day (BID) | ORAL | Status: DC

## 2014-02-21 MED ORDER — AMITRIPTYLINE HCL 25 MG PO TABS: ORAL_TABLET | ORAL | Status: DC

## 2014-02-21 MED ORDER — AMLODIPINE BESYLATE 10 MG PO TABS: ORAL_TABLET | ORAL | Status: DC

## 2014-02-21 NOTE — Assessment & Plan Note (Addendum)
Pt instructed to keep upcoming appointment. No obvious ascites today on exam. Advised pt to drop back down to 20mg  bid lasix dosing.

## 2014-02-21 NOTE — Patient Instructions (Signed)
Please schedule a follow up appointment in 3 months.

## 2014-02-21 NOTE — Assessment & Plan Note (Signed)
Improved.  Continue current meds

## 2014-02-21 NOTE — Assessment & Plan Note (Signed)
>>  ASSESSMENT AND PLAN FOR ESSENTIAL HYPERTENSION WRITTEN ON 02/21/2014  7:17 AM BY O'SULLIVAN, Shetara Launer, NP  Improved. Continue current meds.

## 2014-02-21 NOTE — Progress Notes (Signed)
Pre visit review using our clinic review tool, if applicable. No additional management support is needed unless otherwise documented below in the visit note. 

## 2014-02-21 NOTE — Progress Notes (Signed)
Subjective:    Patient ID: Matthew Lee, male    DOB: 07-04-1956, 58 y.o.   MRN: [id number]  HPI  Mr. Kitamura is a 58 yr old male who presents today for follow up of his hypertension.last visit his labetalol was increased from 100mg  bid to 150mg  bid. He was continued on lasix and amlodipine.   BP Readings from Last 3 Encounters:  02/21/14 122/80  02/07/14 138/98  01/24/14 170/102   Denies LE swelling, notes abdominal swelling worsened but then he increased lasix to 40mg  twice daily.  Reports that this has improved his abdominal swelling significantly   Review of Systems See HPI  Past Medical History  Diagnosis Date  . Hypertension     long standing since 2007, was treated for sometime, but then discontinued taking meds on his own.  . Pulmonary edema, acute     hospitalized in 09/2009 with hypertensive crisis and acute pulmonary edema- improved with lasix nad labetalol.  . History of tobacco abuse   . Headache(784.0)   . Hepatitis C 08/24/2012    History   Social History  . Marital Status: Married    Spouse Name: N/A    Number of Children: N/A  . Years of Education: N/A   Occupational History  . 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 Smoking status: Former Smoker -- 1.00 packs/day for 40 years    Types: Cigarettes    Quit date: 09/23/2009  . Smokeless tobacco: Not on file  . Alcohol Use: No     Comment: hx of heavy ETOH abuse  . Drug Use: No  . Sexual Activity: Not on file   Other Topics Concern  . Not on file   Social History Narrative   Lives at home with wife, daughter and her 2 children.   Caffeine use:  1 daily   Works a Aeronautical engineer (Museum/gallery conservator)   Enjoys Optician, dispensing, Civil engineer, contracting.          Past Surgical History  Procedure Laterality Date  . Tonsillectomy    . Esophagogastroduodenoscopy  08/21/2011    Procedure: ESOPHAGOGASTRODUODENOSCOPY (EGD);  Surgeon: Charna Elizabeth, MD;   Location: [facility];  Service: Endoscopy;  Laterality: N/A;    Family History  Problem Relation Age of Onset  . Polycystic kidney disease Father     No Known Allergies  Current Outpatient Prescriptions on File Prior to Visit  Medication Sig Dispense Refill  . amitriptyline (ELAVIL) 25 MG tablet TAKE 1 TABLET AT BEDTIME  90 tablet  0  . amLODipine (NORVASC) 10 MG tablet TAKE 1 TABLET DAILY  90 tablet  0  . furosemide (LASIX) 20 MG tablet TAKE 1 TABLET TWICE A DAY  180 tablet  0  . labetalol (NORMODYNE) 100 MG tablet Take 150 mg by mouth 2 (two) times daily. TAKE 1 TABLET TWICE A DAY       No current facility-administered medications on file prior to visit.    BP 122/80  Pulse 72  Temp(Src) 98.1 F (36.7 C) (Oral)  Resp 16  Ht 5\' 7"  (1.702 m)  Wt 191 lb 1.9 oz (86.691 kg)  BMI 29.93 kg/m2  SpO2 96%       Objective:   Physical Exam  Constitutional: He is oriented to person, place, and time. He appears well-developed and well-nourished. No distress.  Cardiovascular: Normal rate and regular rhythm.   No murmur heard. Pulmonary/Chest: Effort  normal. No respiratory distress. He has no wheezes. He has no rales. He exhibits no tenderness.  Abdominal: Soft. Bowel sounds are normal. He exhibits no distension. There is no tenderness.  Musculoskeletal: He exhibits no edema.  Neurological: He is oriented to person, place, and time.  Psychiatric: He has a normal mood and affect. His behavior is normal. Judgment and thought content normal.          Assessment & Plan:

## 2014-02-27 ENCOUNTER — Telehealth: Payer: Self-pay | Admitting: *Deleted

## 2014-02-27 MED ORDER — LABETALOL HCL 100 MG PO TABS: 150.0000 mg | ORAL_TABLET | Freq: Two times a day (BID) | ORAL | Status: DC

## 2014-02-27 NOTE — Telephone Encounter (Signed)
Received fax from [facility] that Labetalol rx has 2 sets of directions on it. Take 1 1/2 tablets twice a day and 1 tablet twice a day. Please advise which directions are correct?

## 2014-02-27 NOTE — Telephone Encounter (Signed)
Dose is 150 mg bid 

## 2014-02-27 NOTE — Telephone Encounter (Signed)
Rx sent with corrected directions.

## 2014-04-20 ENCOUNTER — Encounter: Payer: Self-pay | Admitting: Family

## 2014-05-09 ENCOUNTER — Telehealth: Payer: Self-pay | Admitting: *Deleted

## 2014-05-09 MED ORDER — LABETALOL HCL 100 MG PO TABS: 150.0000 mg | ORAL_TABLET | Freq: Two times a day (BID) | ORAL | Status: DC

## 2014-05-09 NOTE — Telephone Encounter (Signed)
REceived fax requesting refill of labetalol. Refill sent for 90 day supply. Pt is due for follow up on 05/24/14.  Please call pt to arrange appt before further refills can be sent.

## 2014-05-10 NOTE — Telephone Encounter (Signed)
Left detailed message for informing patient of medication refill and to call our office to schedule appointment

## 2014-05-11 NOTE — Telephone Encounter (Signed)
Left message for patient to return my call.

## 2014-05-15 NOTE — Telephone Encounter (Signed)
Left message for patient to return my call.

## 2014-05-21 ENCOUNTER — Other Ambulatory Visit: Payer: Self-pay | Admitting: Family

## 2014-05-23 NOTE — Telephone Encounter (Signed)
eScribe request from [facility] for refill on Amitriptyline 25 mg Last filled - 05.27.15, #90x0 Last AEX - 05.27.15 Next AEX - 3 Mths [Stephanie is trying to reach pt to schedule appt] Please Advise on refills/SLS

## 2014-07-19 ENCOUNTER — Other Ambulatory Visit: Payer: Self-pay | Admitting: Family

## 2014-08-02 ENCOUNTER — Other Ambulatory Visit: Payer: Self-pay | Admitting: Family

## 2014-08-03 ENCOUNTER — Telehealth: Payer: Self-pay | Admitting: *Deleted

## 2014-08-03 ENCOUNTER — Telehealth: Payer: Self-pay | Admitting: Family

## 2014-08-03 MED ORDER — AMLODIPINE BESYLATE 10 MG PO TABS: ORAL_TABLET | ORAL | Status: DC

## 2014-08-03 MED ORDER — AMITRIPTYLINE HCL 25 MG PO TABS: ORAL_TABLET | ORAL | Status: DC

## 2014-08-03 NOTE — Telephone Encounter (Signed)
Received refill request for Amlodipine 10mg , 90 day supply. Pt was last seen in May and advised 3 month follow up. Pt is past due. Will send 30 day supply to local pharmacy. Please call pt to arrange f/u before further refills due.

## 2014-08-03 NOTE — Telephone Encounter (Signed)
Pt was due for follow up in August and is past due. 30 day supply amitriptyline sent to local pharmacy. Please call pt to arrange appt for further refills.

## 2014-08-10 ENCOUNTER — Other Ambulatory Visit: Payer: Self-pay | Admitting: Family

## 2014-08-13 NOTE — Telephone Encounter (Signed)
Mailed letter to pt

## 2014-08-13 NOTE — Telephone Encounter (Signed)
lft message for pt to call and make appt

## 2014-08-15 NOTE — Telephone Encounter (Signed)
Letter has been mailed to pt.

## 2014-08-15 NOTE — Telephone Encounter (Signed)
Pt has appt scheduled for 11/23

## 2014-08-20 ENCOUNTER — Encounter: Payer: Self-pay | Admitting: Family

## 2014-08-20 ENCOUNTER — Ambulatory Visit (INDEPENDENT_AMBULATORY_CARE_PROVIDER_SITE_OTHER): Payer: BC Managed Care – PPO | Admitting: Family

## 2014-08-20 VITALS — BP 110/78 | HR 81 | Temp 97.9°F | Resp 16 | Ht 67.0 in | Wt 198.0 lb

## 2014-08-20 DIAGNOSIS — I10 Essential (primary) hypertension: Secondary | ICD-10-CM

## 2014-08-20 DIAGNOSIS — B182 Chronic viral hepatitis C: Secondary | ICD-10-CM

## 2014-08-20 NOTE — Progress Notes (Signed)
Pre visit review using our clinic review tool, if applicable. No additional management support is needed unless otherwise documented below in the visit note. 

## 2014-08-20 NOTE — Progress Notes (Signed)
Subjective:    Patient ID: Matthew ShownCarl H Pellicano, male    DOB: [birth year], 58 y.o.   MRN: [id number]  HPI  Hep C- following with ID (Dr. Sharlene MottsMaria Nunez) will start therapy next week.  HTN- currently maintained on amlodipine, labetalol and furosemide.  He denies CP/SOB. BP Readings from Last 3 Encounters:  08/20/14 110/78  02/21/14 122/80  02/07/14 138/98    No other concerns today.   Review of Systems See HPI  Past Medical History  Diagnosis Date  . Hypertension     long standing since 2007, was treated for sometime, but then discontinued taking meds on his own.  . Pulmonary edema, acute     hospitalized in 09/2009 with hypertensive crisis and acute pulmonary edema- improved with lasix nad labetalol.  . History of tobacco abuse   . Headache(784.0)   . Hepatitis C 08/24/2012    History   Social History  . Marital Status: Married    Spouse Name: N/A    Number of Children: N/A  . Years of Education: N/A   Occupational History  . 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 Smoking status: Former Smoker -- 1.00 packs/day for 40 years    Types: Cigarettes    Quit date: 09/23/2009  . Smokeless tobacco: Not on file  . Alcohol Use: No     Comment: hx of heavy ETOH abuse  . Drug Use: No  . Sexual Activity: Not on file   Other Topics Concern  . Not on file   Social History Narrative   Lives at home with wife, daughter and her 2 children.   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          Past Surgical History  Procedure Laterality Date  . Tonsillectomy    . Esophagogastroduodenoscopy  08/21/2011    Procedure: ESOPHAGOGASTRODUODENOSCOPY (EGD);  Surgeon: Charna ElizabethJyothi Mann, MD;  Location: [facility];  Service: Endoscopy;  Laterality: N/A;    Family History  Problem Relation Age of Onset  . Polycystic kidney disease Father     No Known  Allergies  Current Outpatient Prescriptions on File Prior to Visit  Medication Sig Dispense Refill  . amitriptyline (ELAVIL) 25 MG tablet TAKE 1 TABLET AT BEDTIME 90 tablet 0  . amLODipine (NORVASC) 10 MG tablet TAKE 1 TABLET DAILY 30 tablet 0  . furosemide (LASIX) 20 MG tablet TAKE 1 TABLET TWICE A DAY 180 tablet 0  . labetalol (NORMODYNE) 100 MG tablet TAKE ONE AND ONE-HALF TABLETS TWICE A DAY 270 tablet 0   No current facility-administered medications on file prior to visit.    BP 110/78 mmHg  Pulse 81  Temp(Src) 97.9 F (36.6 C) (Oral)  Resp 16  Ht 5\' 7"  (1.702 m)  Wt 198 lb (89.812 kg)  BMI 31.00 kg/m2  SpO2 96%       Objective:   Physical Exam  Constitutional: He is oriented to person, place, and time. He appears well-developed and well-nourished. No distress.  HENT:  Head: Normocephalic and atraumatic.  Cardiovascular: Normal rate and regular rhythm.   No murmur heard. Pulmonary/Chest: Effort normal and breath sounds normal. No respiratory distress. He has no wheezes. He has no rales. He exhibits no tenderness.  Musculoskeletal: He exhibits no edema.  Neurological: He is alert and oriented to person, place, and time.  Psychiatric:  He has a normal mood and affect. His behavior is normal. Judgment and thought content normal.          Assessment & Plan:

## 2014-08-20 NOTE — Assessment & Plan Note (Signed)
>>  ASSESSMENT AND PLAN FOR ESSENTIAL HYPERTENSION WRITTEN ON 08/20/2014  2:28 PM BY O'SULLIVAN, Menashe Kafer, NP  BP stable on current meds.  Continue same.

## 2014-08-20 NOTE — Patient Instructions (Signed)
Please schedule a follow up appointment in 3 months.

## 2014-08-20 NOTE — Assessment & Plan Note (Signed)
BP stable on current meds. Continue same.  

## 2014-08-20 NOTE — Assessment & Plan Note (Signed)
Will start therapy next week under the direction of Dr. Sharlene MottsMaria Lee.

## 2014-09-28 ENCOUNTER — Other Ambulatory Visit: Payer: Self-pay | Admitting: Family

## 2014-10-23 ENCOUNTER — Other Ambulatory Visit: Payer: Self-pay | Admitting: Family

## 2014-10-23 NOTE — Telephone Encounter (Signed)
Medication Detail      Disp Refills Start End     furosemide (LASIX) 20 MG tablet 180 tablet 0 08/13/2014     Sig: TAKE 1 TABLET TWICE A DAY    E-Prescribing Status: Receipt confirmed by pharmacy (08/13/2014 8:24 AM EST)    Medication Detail      Disp Refills Start End     amitriptyline (ELAVIL) 25 MG tablet 90 tablet 0 08/13/2014     Sig: TAKE 1 TABLET AT BEDTIME    E-Prescribing Status: Receipt confirmed by pharmacy (08/13/2014 8:24 AM EST)     Pharmacy    [facility] - [place], [place] - [address]   Patient Last OV 11.23.15, ROV due: 3-Mths, no future appointment scheduled and Rx request last to Mail Pharmacy 11.16.15, will not be due until 02.14.16/SLS Please Advise on refills.

## 2014-10-24 NOTE — Telephone Encounter (Signed)
Ok to send 90 day supply. 

## 2014-10-29 NOTE — Telephone Encounter (Signed)
Refills sent

## 2014-11-20 ENCOUNTER — Other Ambulatory Visit: Payer: Self-pay | Admitting: Family

## 2014-11-21 NOTE — Telephone Encounter (Signed)
[facility] Pt last seen 07/2014 and due for follow up this month. Please call pt to arrange follow up

## 2014-11-21 NOTE — Telephone Encounter (Signed)
Informed patient wife of med refill and for patient to call our office to schedule appointment

## 2014-11-22 ENCOUNTER — Encounter: Payer: Self-pay | Admitting: Family

## 2014-11-22 ENCOUNTER — Ambulatory Visit (INDEPENDENT_AMBULATORY_CARE_PROVIDER_SITE_OTHER): Payer: BLUE CROSS/BLUE SHIELD | Admitting: Family

## 2014-11-22 VITALS — BP 132/90 | HR 77 | Temp 97.9°F | Resp 20 | Ht 67.0 in | Wt 194.8 lb

## 2014-11-22 DIAGNOSIS — K219 Gastro-esophageal reflux disease without esophagitis: Secondary | ICD-10-CM

## 2014-11-22 DIAGNOSIS — I10 Essential (primary) hypertension: Secondary | ICD-10-CM

## 2014-11-22 DIAGNOSIS — R739 Hyperglycemia, unspecified: Secondary | ICD-10-CM

## 2014-11-22 DIAGNOSIS — G4700 Insomnia, unspecified: Secondary | ICD-10-CM

## 2014-11-22 DIAGNOSIS — B182 Chronic viral hepatitis C: Secondary | ICD-10-CM

## 2014-11-22 HISTORY — DX: Hyperglycemia, unspecified: R73.9

## 2014-11-22 LAB — BASIC METABOLIC PANEL
BUN: 12 mg/dL (ref 6–23)
CALCIUM: 9.5 mg/dL (ref 8.4–10.5)
CHLORIDE: 105 meq/L (ref 96–112)
CO2: 27 meq/L (ref 19–32)
Creatinine, Ser: 0.95 mg/dL (ref 0.40–1.50)
GFR: 85.84 mL/min (ref >60.00)
Glucose, Bld: 101 mg/dL — ABNORMAL HIGH (ref 70–99)
Potassium: 3.9 mEq/L (ref 3.5–5.1)
SODIUM: 137 meq/L (ref 135–145)

## 2014-11-22 LAB — HEMOGLOBIN A1C: Hgb A1c MFr Bld: 4.6 % (ref 4.6–6.5)

## 2014-11-22 NOTE — Assessment & Plan Note (Signed)
Obtain a1c.  

## 2014-11-22 NOTE — Assessment & Plan Note (Signed)
Stable, management per ID.  °

## 2014-11-22 NOTE — Progress Notes (Signed)
Pre visit review using our clinic review tool, if applicable. No additional management support is needed unless otherwise documented below in the visit note. 

## 2014-11-22 NOTE — Assessment & Plan Note (Signed)
Controlled with prn use of tums

## 2014-11-22 NOTE — Assessment & Plan Note (Signed)
>>  ASSESSMENT AND PLAN FOR ESSENTIAL HYPERTENSION WRITTEN ON 11/22/2014 12:24 PM BY O'SULLIVAN, Hayde Kilgour, NP  BP stable on current meds.

## 2014-11-22 NOTE — Assessment & Plan Note (Signed)
BP stable on current meds.   

## 2014-11-22 NOTE — Patient Instructions (Signed)
Please complete lab work prior to leaving. Schedule a fasting physical at the front desk.

## 2014-11-22 NOTE — Assessment & Plan Note (Signed)
Controlled with rare use of tums.

## 2014-11-22 NOTE — Progress Notes (Signed)
Subjective:    Patient ID: Matthew Lee, male    DOB: 1956-01-27, 59 y.o.   MRN: [id number]  HPI  Mr. Plath is a 59 yr old male who presents today for follow up.  Hep C- followed at [facility] by Dr. Wyvonnia Lora.  Reports Hep c is currently undetectable.  HTN- Patient is currently maintained on the following medications for blood pressure: amlodipine, labetalol, ascites Patient reports good compliance with blood pressure medications. Patient denies chest pain, shortness of breath. Occasional dependent edema which resolves with rest. Last 3 blood pressure readings in our office are as follows: BP Readings from Last 3 Encounters:  11/22/14 132/90  08/20/14 110/78  02/21/14 122/80   GERD- controlled with rare use of tums.  Insomnia- reports that he uses elavil prn which helps.  Hyperglycemia-  Lab Results  Component Value Date   HGBA1C 5.9* 01/24/2014       Review of Systems     Past Medical History  Diagnosis Date  . Hypertension     long standing since 2007, was treated for sometime, but then discontinued taking meds on his own.  . Pulmonary edema, acute     hospitalized in 09/2009 with hypertensive crisis and acute pulmonary edema- improved with lasix nad labetalol.  . History of tobacco abuse   . Headache(784.0)   . Hepatitis C 08/24/2012    History   Social History  . Marital Status: Married    Spouse Name: N/A  . Number of Children: N/A  . Years of Education: N/A   Occupational History  . 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 Smoking status: Former Smoker -- 1.00 packs/day for 40 years    Types: Cigarettes    Quit date: 09/23/2009  . Smokeless tobacco: Not on file  . Alcohol Use: No     Comment: hx of heavy ETOH abuse  . Drug Use: No  . Sexual Activity: Not on file   Other Topics Concern  . Not on file   Social History Narrative   Lives at home with wife, daughter and her 2 children.   Caffeine  use:  1 daily   Works a Aeronautical engineer (Museum/gallery conservator)   Enjoys Optician, dispensing, Civil engineer, contracting.          Past Surgical History  Procedure Laterality Date  . Tonsillectomy    . Esophagogastroduodenoscopy  08/21/2011    Procedure: ESOPHAGOGASTRODUODENOSCOPY (EGD);  Surgeon: Charna Elizabeth, MD;  Location: [facility];  Service: Endoscopy;  Laterality: N/A;    Family History  Problem Relation Age of Onset  . Polycystic kidney disease Father     No Known Allergies  Current Outpatient Prescriptions on File Prior to Visit  Medication Sig Dispense Refill  . amitriptyline (ELAVIL) 25 MG tablet TAKE 1 TABLET AT BEDTIME 90 tablet 0  . amLODipine (NORVASC) 10 MG tablet TAKE 1 TABLET DAILY 90 tablet 0  . furosemide (LASIX) 20 MG tablet TAKE 1 TABLET TWICE A DAY 180 tablet 0  . labetalol (NORMODYNE) 100 MG tablet TAKE ONE AND ONE-HALF TABLETS TWICE A DAY 270 tablet 0   No current facility-administered medications on file prior to visit.    BP 132/90 mmHg  Pulse 77  Temp(Src) 97.9 F (36.6 C) (Oral)  Resp 20  Ht  (1.702 m)  Wt 194 lb 12.8 oz (88.361 kg)  BMI 30.50 kg/m2  SpO2 100%    Objective:  Physical Exam  Constitutional: He is oriented to person, place, and time. He appears well-developed and well-nourished. No distress.  HENT:  Head: Normocephalic and atraumatic.  Cardiovascular: Normal rate and regular rhythm.   No murmur heard. Pulmonary/Chest: Effort normal and breath sounds normal. No respiratory distress. He has no wheezes. He has no rales.  Abdominal: Soft. Bowel sounds are normal. He exhibits no distension and no mass. There is no tenderness. There is no rebound and no guarding.  Musculoskeletal: He exhibits no edema.  Neurological: He is alert and oriented to person, place, and time.  Skin: Skin is warm and dry.  Psychiatric: He has a normal mood and affect. His behavior is normal. Thought content normal.          Assessment & Plan:

## 2014-12-12 ENCOUNTER — Other Ambulatory Visit: Payer: Self-pay | Admitting: Family

## 2014-12-13 ENCOUNTER — Telehealth: Payer: Self-pay | Admitting: Family

## 2014-12-13 NOTE — Telephone Encounter (Signed)
CPE letter sent °

## 2014-12-31 ENCOUNTER — Ambulatory Visit (INDEPENDENT_AMBULATORY_CARE_PROVIDER_SITE_OTHER): Payer: BLUE CROSS/BLUE SHIELD | Admitting: Family

## 2014-12-31 ENCOUNTER — Encounter: Payer: Self-pay | Admitting: Family

## 2014-12-31 VITALS — BP 130/70 | HR 83 | Temp 98.2°F | Resp 16 | Ht 67.0 in | Wt 192.0 lb

## 2014-12-31 DIAGNOSIS — R21 Rash and other nonspecific skin eruption: Secondary | ICD-10-CM

## 2014-12-31 DIAGNOSIS — Z0000 Encounter for general adult medical examination without abnormal findings: Secondary | ICD-10-CM | POA: Diagnosis not present

## 2014-12-31 DIAGNOSIS — Z23 Encounter for immunization: Secondary | ICD-10-CM | POA: Diagnosis not present

## 2014-12-31 HISTORY — DX: Encounter for general adult medical examination without abnormal findings: Z00.00

## 2014-12-31 LAB — LIPID PANEL
CHOLESTEROL: 186 mg/dL (ref 0–200)
HDL: 30.1 mg/dL — ABNORMAL LOW (ref >39.00)
LDL Cholesterol: 128 mg/dL — ABNORMAL HIGH (ref 0–99)
NONHDL: 155.9
Total CHOL/HDL Ratio: 6
Triglycerides: 142 mg/dL (ref 0.0–149.0)
VLDL: 28.4 mg/dL (ref 0.0–40.0)

## 2014-12-31 LAB — URINALYSIS, ROUTINE W REFLEX MICROSCOPIC
BILIRUBIN URINE: NEGATIVE
Hgb urine dipstick: NEGATIVE
Ketones, ur: NEGATIVE
Leukocytes, UA: NEGATIVE
Nitrite: NEGATIVE
Specific Gravity, Urine: 1.02 (ref 1.000–1.030)
Total Protein, Urine: NEGATIVE
URINE GLUCOSE: NEGATIVE
Urobilinogen, UA: 0.2 (ref 0.0–1.0)
pH: 5.5 (ref 5.0–8.0)

## 2014-12-31 LAB — HEPATIC FUNCTION PANEL
ALK PHOS: 77 U/L (ref 39–117)
ALT: 16 U/L (ref 0–53)
AST: 16 U/L (ref 0–37)
Albumin: 4 g/dL (ref 3.5–5.2)
BILIRUBIN DIRECT: 0.1 mg/dL (ref 0.0–0.3)
TOTAL PROTEIN: 7.4 g/dL (ref 6.0–8.3)
Total Bilirubin: 0.3 mg/dL (ref 0.2–1.2)

## 2014-12-31 LAB — BASIC METABOLIC PANEL
BUN: 14 mg/dL (ref 6–23)
CHLORIDE: 104 meq/L (ref 96–112)
CO2: 25 meq/L (ref 19–32)
Calcium: 9.8 mg/dL (ref 8.4–10.5)
Creatinine, Ser: 1 mg/dL (ref 0.40–1.50)
GFR: 80.88 mL/min (ref >60.00)
Glucose, Bld: 103 mg/dL — ABNORMAL HIGH (ref 70–99)
POTASSIUM: 4.2 meq/L (ref 3.5–5.1)
SODIUM: 137 meq/L (ref 135–145)

## 2014-12-31 LAB — CBC WITH DIFFERENTIAL/PLATELET
Basophils Absolute: 0 10*3/uL (ref 0.0–0.1)
Basophils Relative: 0.4 % (ref 0.0–3.0)
Eosinophils Absolute: 0.3 10*3/uL (ref 0.0–0.7)
Eosinophils Relative: 3 % (ref 0.0–5.0)
HCT: 45.7 % (ref 39.0–52.0)
Hemoglobin: 15.4 g/dL (ref 13.0–17.0)
Lymphocytes Relative: 18.5 % (ref 12.0–46.0)
Lymphs Abs: 1.6 10*3/uL (ref 0.7–4.0)
MCHC: 33.7 g/dL (ref 30.0–36.0)
MCV: 82.9 fl (ref 78.0–100.0)
Monocytes Absolute: 0.7 10*3/uL (ref 0.1–1.0)
Monocytes Relative: 8.2 % (ref 3.0–12.0)
Neutro Abs: 6 10*3/uL (ref 1.4–7.7)
Neutrophils Relative %: 69.9 % (ref 43.0–77.0)
Platelets: 267 10*3/uL (ref 150.0–400.0)
RBC: 5.5 Mil/uL (ref 4.22–5.81)
RDW: 13 % (ref 11.5–15.5)
WBC: 8.5 10*3/uL (ref 4.0–10.5)

## 2014-12-31 LAB — PSA: PSA: 1.55 ng/mL (ref 0.10–4.00)

## 2014-12-31 LAB — TSH: TSH: 1.23 u[IU]/mL (ref 0.35–4.50)

## 2014-12-31 MED ORDER — BETAMETHASONE DIPROPIONATE 0.05 % EX CREA: TOPICAL_CREAM | Freq: Two times a day (BID) | CUTANEOUS | Status: DC

## 2014-12-31 MED ORDER — FUROSEMIDE 20 MG PO TABS: 20.0000 mg | ORAL_TABLET | Freq: Two times a day (BID) | ORAL | Status: DC

## 2014-12-31 MED ORDER — AMLODIPINE BESYLATE 10 MG PO TABS: 10.0000 mg | ORAL_TABLET | Freq: Every day | ORAL | Status: DC

## 2014-12-31 MED ORDER — LABETALOL HCL 100 MG PO TABS: ORAL_TABLET | ORAL | Status: DC

## 2014-12-31 MED ORDER — AMITRIPTYLINE HCL 25 MG PO TABS: 25.0000 mg | ORAL_TABLET | Freq: Every day | ORAL | Status: DC

## 2014-12-31 NOTE — Progress Notes (Signed)
Subjective:    Patient ID: Matthew Lee, male    DOB: 12-29-1955, 59 y.o.   MRN: [id number]  HPI   Matthew Lee is a 59 yr old male who presents today for cpx.  Patient presents today for complete physical.  Immunizations: due for pneumovax Diet: reports healthy diet Exercise: walks at work only, no formal exercise Colonoscopy: due (never had colo)  Nickerson- bilateral anterior lower legs, was pruritic, not pruritic.  Has tried lamisil and lotion but was not very diligent.      Review of Systems  Constitutional: Negative for fever, fatigue and unexpected weight change.  HENT: Positive for tinnitus. Negative for rhinorrhea.   Respiratory: Negative for cough.   Cardiovascular:       Some dependent edema at end of day  Gastrointestinal: Positive for constipation. Negative for diarrhea.       Rare gerd symptoms  Genitourinary: Negative for dysuria.  Skin:       See hpi  Neurological: Negative for headaches.  Hematological: Negative for adenopathy.  Psychiatric/Behavioral:       Denies depression   Past Medical History  Diagnosis Date  . Hypertension     long standing since 2007, was treated for sometime, but then discontinued taking meds on his own.  . Pulmonary edema, acute     hospitalized in 09/2009 with hypertensive crisis and acute pulmonary edema- improved with lasix nad labetalol.  . History of tobacco abuse   . Headache(784.0)   . Hepatitis C 08/24/2012    History   Social History  . Marital Status: Married    Spouse Name: N/A  . Number of Children: N/A  . Years of Education: N/A   Occupational History  . 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 Smoking status: Former Smoker -- 1.00 packs/day for 40 years    Types: Cigarettes    Quit date: 09/23/2009  . Smokeless tobacco: Not on file  . Alcohol Use: No     Comment: hx of heavy ETOH abuse  . Drug Use: No  . Sexual Activity: Not on file   Other Topics  Concern  . Not on file   Social History Narrative   Lives at home with wife, daughter and her 2 children.   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          Past Surgical History  Procedure Laterality Date  . Tonsillectomy    . Esophagogastroduodenoscopy  08/21/2011    Procedure: ESOPHAGOGASTRODUODENOSCOPY (EGD);  Surgeon: Charna ElizabethJyothi Mann, MD;  Location: [facility];  Service: Endoscopy;  Laterality: N/A;    Family History  Problem Relation Age of Onset  . Polycystic kidney disease Father     No Known Allergies  Current Outpatient Prescriptions on File Prior to Visit  Medication Sig Dispense Refill  . amitriptyline (ELAVIL) 25 MG tablet TAKE 1 TABLET AT BEDTIME 90 tablet 0  . amLODipine (NORVASC) 10 MG tablet TAKE 1 TABLET DAILY 90 tablet 0  . furosemide (LASIX) 20 MG tablet TAKE 1 TABLET TWICE A DAY 180 tablet 0  . labetalol (NORMODYNE) 100 MG tablet TAKE ONE AND ONE-HALF TABLETS TWICE A DAY 270 tablet 1   No current facility-administered medications on file prior to visit.    BP 130/70 mmHg  Pulse 83  Temp(Src) 98.2 F (36.8 C) (Oral)  Resp  16  Ht  (1.702 m)  Wt 192 lb (87.091 kg)  BMI 30.06 kg/m2  SpO2 98%        Objective:   Physical Exam Physical Exam  Constitutional: He is oriented to person, place, and time. He appears well-developed and well-nourished. No distress.  HENT:  Head: Normocephalic and atraumatic.  Right Ear: Tympanic membrane and ear canal normal.  Left Ear: Tympanic membrane and ear canal normal.  Mouth/Throat: Oropharynx is clear and moist.  Eyes: Pupils are equal, round, and reactive to light. No scleral icterus.  Neck: Normal range of motion. No thyromegaly present.  Cardiovascular: Normal rate and regular rhythm.   No murmur heard. Pulmonary/Chest: Effort normal and breath sounds normal. No respiratory distress. He has no wheezes. He has no rales. He  exhibits no tenderness.  Abdominal: Soft. Protuberant. Bowel sounds are normal. He exhibits no distension and no mass. There is no tenderness. There is no rebound and no guarding.  Musculoskeletal: He exhibits no edema.  Lymphadenopathy:    He has no cervical adenopathy.  Neurological: He is alert and oriented to person, place, and time. He has normal patellar reflexes. He exhibits normal muscle tone. Coordination normal.  Skin: Skin is warm and dry. some excoriation and hyperpigmentation bilateral shin R>L Psychiatric: He has a normal mood and affect. His behavior is normal. Judgment and thought content normal.          Assessment & Plan:          Assessment & Plan:

## 2014-12-31 NOTE — Patient Instructions (Addendum)
Please complete lab work prior to leaving. Try to get 30 minutes of walking 5 days a week, continue healthy diet and work on weight loss. You will be contacted about your colonoscopy. Please start diprolene cream on shins twice daily for 1 week- call if symptoms worsen or if symptoms do not improve. Check with your insurance if they will cover zostavax (shingles) shot.  If so, schedule nurse visit in 1 month so we can give it to you.   Follow up in 6 months.

## 2014-12-31 NOTE — Progress Notes (Signed)
Pre visit review using our clinic review tool, if applicable. No additional management support is needed unless otherwise documented below in the visit note. 

## 2014-12-31 NOTE — Assessment & Plan Note (Signed)
Discussed healthy diet, exercise, weight loss.  Pneumovax today, pt will check with insurance re: zostavax coverage, obtain EKG, routine labs today.  Refer for colo, obtain PSA- discussed pros/cons.

## 2014-12-31 NOTE — Assessment & Plan Note (Signed)
Trial of short course of betamethasone.

## 2015-01-01 ENCOUNTER — Telehealth: Payer: Self-pay | Admitting: Family

## 2015-01-01 NOTE — Telephone Encounter (Signed)
See mychart msg

## 2015-01-30 ENCOUNTER — Encounter: Payer: Self-pay | Admitting: Family

## 2015-02-18 DIAGNOSIS — K824 Cholesterolosis of gallbladder: Secondary | ICD-10-CM

## 2015-02-18 HISTORY — DX: Cholesterolosis of gallbladder: K82.4

## 2015-06-27 ENCOUNTER — Other Ambulatory Visit: Payer: Self-pay | Admitting: Family

## 2015-07-02 ENCOUNTER — Ambulatory Visit: Payer: BLUE CROSS/BLUE SHIELD | Admitting: Family

## 2015-07-11 ENCOUNTER — Telehealth: Payer: Self-pay | Admitting: Family

## 2015-07-11 NOTE — Telephone Encounter (Signed)
Yes please

## 2015-07-11 NOTE — Telephone Encounter (Signed)
Pt was no show 07/02/15 7:00am, follow up appt, 1st no show I see, pt has not rescheduled, charge or no charge?

## 2015-07-12 ENCOUNTER — Other Ambulatory Visit: Payer: Self-pay | Admitting: Family

## 2015-07-12 NOTE — Telephone Encounter (Signed)
Scheduled for 08/13/15 with pt wife Lanora Manislizabeth.

## 2015-07-12 NOTE — Telephone Encounter (Signed)
90 day supply amlodipine sent to pharmacy. Pt cancelled 07/02/15 follow up. Please call pt to reschedule follow up before next refill will be due.

## 2015-08-02 ENCOUNTER — Other Ambulatory Visit: Payer: Self-pay | Admitting: Family

## 2015-08-02 NOTE — Telephone Encounter (Signed)
[facility] Pt has F/U appointment 11.15.16.

## 2015-08-13 ENCOUNTER — Encounter: Payer: Self-pay | Admitting: Family

## 2015-08-13 ENCOUNTER — Ambulatory Visit (INDEPENDENT_AMBULATORY_CARE_PROVIDER_SITE_OTHER): Payer: BLUE CROSS/BLUE SHIELD | Admitting: Family

## 2015-08-13 VITALS — BP 130/92 | HR 86 | Temp 98.0°F | Resp 16 | Ht 67.0 in | Wt 195.8 lb

## 2015-08-13 DIAGNOSIS — B182 Chronic viral hepatitis C: Secondary | ICD-10-CM

## 2015-08-13 DIAGNOSIS — I10 Essential (primary) hypertension: Secondary | ICD-10-CM | POA: Diagnosis not present

## 2015-08-13 DIAGNOSIS — K219 Gastro-esophageal reflux disease without esophagitis: Secondary | ICD-10-CM | POA: Diagnosis not present

## 2015-08-13 LAB — BASIC METABOLIC PANEL
BUN: 19 mg/dL (ref 6–23)
CALCIUM: 9.8 mg/dL (ref 8.4–10.5)
CHLORIDE: 103 meq/L (ref 96–112)
CO2: 29 mEq/L (ref 19–32)
CREATININE: 1.19 mg/dL (ref 0.40–1.50)
GFR: 66.04 mL/min (ref >60.00)
Glucose, Bld: 83 mg/dL (ref 70–99)
Potassium: 4 mEq/L (ref 3.5–5.1)
Sodium: 139 mEq/L (ref 135–145)

## 2015-08-13 NOTE — Progress Notes (Signed)
Pre visit review using our clinic review tool, if applicable. No additional management support is needed unless otherwise documented below in the visit note. 

## 2015-08-13 NOTE — Progress Notes (Signed)
Subjective:    Patient ID: Matthew Lee, male    DOB: April 23, 1956, 59 y.o.   MRN: [id number]  HPI  Mr. Matthew Lee is a 59 yr old male who presents today for follow up.  1) HTN- current BP meds include labetalol, amlodipine and lasix.  Denies CP/SOB or swelling.   BP Readings from Last 3 Encounters:  12/31/14 130/70  11/22/14 132/90  08/20/14 110/78   2) Hep C- Records reviewed in patient if followed by Dr. Wyvonnia Lora at [facility] and was treated with viekira pak + Ribavirin.  He completed the treatment and Hep C RNA was negative in 02/18/15  Had Flu shot back in September.    Review of Systems See HPI  Past Medical History  Diagnosis Date  . Hypertension     long standing since 2007, was treated for sometime, but then discontinued taking meds on his own.  . Pulmonary edema, acute (HCC)     hospitalized in 09/2009 with hypertensive crisis and acute pulmonary edema- improved with lasix nad labetalol.  . History of tobacco abuse   . Headache(784.0)   . Hepatitis C 08/24/2012    Social History   Social History  . Marital Status: Married    Spouse Name: N/A  . Number of Children: N/A  . Years of Education: N/A   Occupational History  . 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 Smoking status: Former Smoker -- 1.00 packs/day for 40 years    Types: Cigarettes    Quit date: 09/23/2009  . Smokeless tobacco: Not on file  . Alcohol Use: No     Comment: hx of heavy ETOH abuse  . Drug Use: No  . Sexual Activity: Not on file   Other Topics Concern  . Not on file   Social History Narrative   Lives at home with wife, daughter and her 2 children.   Caffeine use:  1 daily   Works a Aeronautical engineer (Museum/gallery conservator)   Enjoys Optician, dispensing, Civil engineer, contracting.          Past Surgical History  Procedure Laterality Date  . Tonsillectomy    . Esophagogastroduodenoscopy  08/21/2011    Procedure: ESOPHAGOGASTRODUODENOSCOPY  (EGD);  Surgeon: Charna Elizabeth, MD;  Location: [facility];  Service: Endoscopy;  Laterality: N/A;    Family History  Problem Relation Age of Onset  . Polycystic kidney disease Father     No Known Allergies  Current Outpatient Prescriptions on File Prior to Visit  Medication Sig Dispense Refill  . amitriptyline (ELAVIL) 25 MG tablet TAKE 1 TABLET AT BEDTIME 90 tablet 1  . amLODipine (NORVASC) 10 MG tablet TAKE 1 TABLET DAILY 90 tablet 0  . aspirin EC 81 MG tablet Take 81 mg by mouth daily.    . furosemide (LASIX) 20 MG tablet TAKE 1 TABLET TWICE A DAY 180 tablet 1  . labetalol (NORMODYNE) 100 MG tablet TAKE ONE AND ONE-HALF TABLETS TWICE A DAY 270 tablet 0   No current facility-administered medications on file prior to visit.    BP 130/92 mmHg  Pulse 86  Temp(Src) 98 F (36.7 C) (Oral)  Resp 16  Ht  (1.702 m)  Wt 195 lb 12.8 oz (88.814 kg)  BMI 30.66 kg/m2  SpO2 99%       Objective:   Physical Exam  Constitutional: He is oriented to person, place, and time. He appears well-developed and well-nourished.  No distress.  HENT:  Head: Normocephalic and atraumatic.  Cardiovascular: Normal rate and regular rhythm.   No murmur heard. Pulmonary/Chest: Effort normal and breath sounds normal. No respiratory distress. He has no wheezes. He has no rales.  Abdominal: Soft. He exhibits no distension. There is no tenderness.  Musculoskeletal: He exhibits no edema.  Neurological: He is alert and oriented to person, place, and time.  Skin: Skin is warm and dry.  Psychiatric: He has a normal mood and affect. His behavior is normal. Thought content normal.          Assessment & Plan:

## 2015-08-13 NOTE — Assessment & Plan Note (Signed)
Completed treatment.

## 2015-08-13 NOTE — Patient Instructions (Signed)
Please complete lab work prior to leaving.   

## 2015-08-13 NOTE — Assessment & Plan Note (Signed)
BP is stable on current meds. Continue same, obtain bmet.  

## 2015-10-11 ENCOUNTER — Other Ambulatory Visit: Payer: Self-pay | Admitting: Family

## 2015-10-29 ENCOUNTER — Other Ambulatory Visit: Payer: Self-pay | Admitting: Family

## 2015-12-23 ENCOUNTER — Other Ambulatory Visit: Payer: Self-pay | Admitting: Family

## 2016-03-23 ENCOUNTER — Other Ambulatory Visit: Payer: Self-pay | Admitting: Family

## 2016-03-23 NOTE — Telephone Encounter (Signed)
Requesting Amitriptyline 25mg -Take 1 tablet at bedtime. Last refill:12/23/15;#90,0 Last OV:08/13/15-F/U pass due. Please advise.//AB/CMA

## 2016-03-23 NOTE — Telephone Encounter (Signed)
Ok to send 30 tabs only, pt is due for follow up.

## 2016-03-24 MED ORDER — AMITRIPTYLINE HCL 25 MG PO TABS: 25.0000 mg | ORAL_TABLET | Freq: Every day | ORAL | Status: DC

## 2016-03-24 NOTE — Telephone Encounter (Signed)
Denial sent to [facility] for 90 day rx. [facility]. Mychart message sent to pt re: follow up.

## 2016-04-01 ENCOUNTER — Encounter: Payer: Self-pay | Admitting: Family

## 2016-04-01 ENCOUNTER — Ambulatory Visit (INDEPENDENT_AMBULATORY_CARE_PROVIDER_SITE_OTHER): Payer: BLUE CROSS/BLUE SHIELD | Admitting: Family

## 2016-04-01 VITALS — BP 150/108 | HR 67 | Temp 98.5°F | Resp 16 | Ht 67.0 in | Wt 194.8 lb

## 2016-04-01 DIAGNOSIS — I10 Essential (primary) hypertension: Secondary | ICD-10-CM | POA: Diagnosis not present

## 2016-04-01 DIAGNOSIS — Z0000 Encounter for general adult medical examination without abnormal findings: Secondary | ICD-10-CM

## 2016-04-01 LAB — BASIC METABOLIC PANEL
BUN: 18 mg/dL (ref 6–23)
CHLORIDE: 103 meq/L (ref 96–112)
CO2: 27 meq/L (ref 19–32)
CREATININE: 1.38 mg/dL (ref 0.40–1.50)
Calcium: 9.7 mg/dL (ref 8.4–10.5)
GFR: 55.54 mL/min — ABNORMAL LOW (ref >60.00)
GLUCOSE: 98 mg/dL (ref 70–99)
POTASSIUM: 4 meq/L (ref 3.5–5.1)
Sodium: 136 mEq/L (ref 135–145)

## 2016-04-01 NOTE — Assessment & Plan Note (Signed)
Uncontrolled. Advised pt to restart amlodipine.  Obtain bmet. Discussed weight loss.

## 2016-04-01 NOTE — Patient Instructions (Addendum)
Please complete lab work prior to leaving. Restart amlodipine. Schedule a complete physical at the front desk.

## 2016-04-01 NOTE — Assessment & Plan Note (Signed)
>>  ASSESSMENT AND PLAN FOR ESSENTIAL HYPERTENSION WRITTEN ON 04/01/2016  8:23 AM BY O'SULLIVAN, Colbi Schiltz, NP  Uncontrolled. Advised pt to restart amlodipine .  Obtain bmet. Discussed weight loss.

## 2016-04-01 NOTE — Progress Notes (Signed)
Pre visit review using our clinic review tool, if applicable. No additional management support is needed unless otherwise documented below in the visit note. 

## 2016-04-01 NOTE — Progress Notes (Signed)
Subjective:    Patient ID: Matthew Lee, male    DOB: [birth year], 60 y.o.   MRN: [id number]  HPI  Matthew Lee is a 60 yr old male who presents today for follow up.  1) HTN- Pt is currently maintained on labetalol and lasix.  He was supposed to be taking amlodipine but discontinued because he did not think it was helping his blood pressure.   BP Readings from Last 3 Encounters:  04/01/16 150/108  08/13/15 130/92  12/31/14 130/70      Review of Systems  Respiratory: Negative for cough.   Cardiovascular: Negative for chest pain and leg swelling.   See HPI  Past Medical History  Diagnosis Date  . Hypertension     long standing since 2007, was treated for sometime, but then discontinued taking meds on his own.  . Pulmonary edema, acute (HCC)     hospitalized in 09/2009 with hypertensive crisis and acute pulmonary edema- improved with lasix nad labetalol.  . History of tobacco abuse   . Headache(784.0)   . Hepatitis C 08/24/2012     Social History   Social History  . Marital Status: Married    Spouse Name: N/A  . Number of Children: N/A  . Years of Education: N/A   Occupational History  . 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 Smoking status: Former Smoker -- 1.00 packs/day for 40 years    Types: Cigarettes    Quit date: 09/23/2009  . Smokeless tobacco: Not on file  . Alcohol Use: No     Comment: hx of heavy ETOH abuse  . Drug Use: No  . Sexual Activity: Not on file   Other Topics Concern  . Not on file   Social History Narrative   Lives at home with wife, daughter and her 2 children.   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          Past Surgical History  Procedure Laterality Date  . Tonsillectomy    . Esophagogastroduodenoscopy  08/21/2011    Procedure: ESOPHAGOGASTRODUODENOSCOPY (EGD);  Surgeon: Charna ElizabethJyothi Mann, MD;   Location: [facility];  Service: Endoscopy;  Laterality: N/A;    Family History  Problem Relation Age of Onset  . Polycystic kidney disease Father     No Known Allergies  Current Outpatient Prescriptions on File Prior to Visit  Medication Sig Dispense Refill  . amitriptyline (ELAVIL) 25 MG tablet Take 1 tablet (25 mg total) by mouth at bedtime. 30 tablet 0  . aspirin EC 81 MG tablet Take 81 mg by mouth daily.    . furosemide (LASIX) 20 MG tablet TAKE 1 TABLET TWICE A DAY 180 tablet 0  . labetalol (NORMODYNE) 100 MG tablet TAKE ONE AND ONE-HALF TABLETS TWICE A DAY 270 tablet 1  . amLODipine (NORVASC) 10 MG tablet TAKE 1 TABLET DAILY (Patient not taking: Reported on 04/01/2016) 90 tablet 1   No current facility-administered medications on file prior to visit.    BP 150/108 mmHg  Pulse 67  Temp(Src) 98.5 F (36.9 C) (Oral)  Resp 16  Ht 5\' 7"  (1.702 m)  Wt 194 lb 12.8 oz (88.361 kg)  BMI 30.50 kg/m2  SpO2 97%       Objective:   Physical Exam  Constitutional: He is oriented to person, place, and time. He appears well-developed  and well-nourished. No distress.  HENT:  Head: Normocephalic and atraumatic.  Cardiovascular: Normal rate and regular rhythm.   No murmur heard. Pulmonary/Chest: Effort normal and breath sounds normal. No respiratory distress. He has no wheezes. He has no rales.  Musculoskeletal: He exhibits no edema.  Neurological: He is alert and oriented to person, place, and time.  Skin: Skin is warm and dry.  Psychiatric: He has a normal mood and affect. His behavior is normal. Thought content normal.          Assessment & Plan:

## 2016-04-08 ENCOUNTER — Other Ambulatory Visit: Payer: Self-pay | Admitting: Family

## 2016-04-08 NOTE — Telephone Encounter (Signed)
Rx sent to the pharmacy by e-script.//AB/CMA 

## 2016-04-27 ENCOUNTER — Other Ambulatory Visit: Payer: Self-pay | Admitting: Family

## 2016-06-22 ENCOUNTER — Other Ambulatory Visit: Payer: Self-pay | Admitting: Family

## 2016-09-21 ENCOUNTER — Other Ambulatory Visit: Payer: Self-pay | Admitting: Family

## 2016-10-06 ENCOUNTER — Other Ambulatory Visit: Payer: Self-pay | Admitting: Family

## 2016-10-25 ENCOUNTER — Other Ambulatory Visit: Payer: Self-pay | Admitting: Family

## 2016-10-27 MED ORDER — LABETALOL HCL 100 MG PO TABS: ORAL_TABLET | ORAL | 0 refills | Status: DC

## 2016-10-27 NOTE — Telephone Encounter (Signed)
Labetalol refill to [facility] denied. Pt last seen 03/2016 and advised to follow up in October. Pt is past due for follow up. Sent 30 day supply to local pharmacy. Please call pt to schedule appt with Melissa soon!

## 2016-10-28 NOTE — Telephone Encounter (Signed)
Called pt several times at phone number provided. Fast busy signal. Unable to get through.

## 2016-12-21 ENCOUNTER — Other Ambulatory Visit: Payer: Self-pay | Admitting: Family

## 2016-12-21 NOTE — Telephone Encounter (Signed)
Pt last OV 04/01/16 and was due for follow up 06/2016. Pt is past due. 30 day supply last sent on 10/25/16 and we were unable to contact pt to schedule follow up. Furosemide refill denied as pt needs to call our office to schedule f/u. May be able to send enough to get pt through until follow up once he schedules appt. Sent mychart message to pt.

## 2016-12-23 ENCOUNTER — Ambulatory Visit (INDEPENDENT_AMBULATORY_CARE_PROVIDER_SITE_OTHER): Payer: BLUE CROSS/BLUE SHIELD | Admitting: Family

## 2016-12-23 ENCOUNTER — Encounter: Payer: Self-pay | Admitting: Family

## 2016-12-23 VITALS — BP 155/84 | HR 88 | Temp 98.5°F | Resp 16 | Ht 67.0 in | Wt 197.2 lb

## 2016-12-23 DIAGNOSIS — I10 Essential (primary) hypertension: Secondary | ICD-10-CM | POA: Diagnosis not present

## 2016-12-23 LAB — COMPREHENSIVE METABOLIC PANEL
ALBUMIN: 4.4 g/dL (ref 3.5–5.2)
ALT: 16 U/L (ref 0–53)
AST: 16 U/L (ref 0–37)
Alkaline Phosphatase: 68 U/L (ref 39–117)
BILIRUBIN TOTAL: 0.4 mg/dL (ref 0.2–1.2)
BUN: 18 mg/dL (ref 6–23)
CALCIUM: 9.7 mg/dL (ref 8.4–10.5)
CHLORIDE: 103 meq/L (ref 96–112)
CO2: 27 meq/L (ref 19–32)
Creatinine, Ser: 1.12 mg/dL (ref 0.40–1.50)
GFR: 70.5 mL/min (ref >60.00)
Glucose, Bld: 89 mg/dL (ref 70–99)
Potassium: 3.7 mEq/L (ref 3.5–5.1)
Sodium: 137 mEq/L (ref 135–145)
Total Protein: 8.1 g/dL (ref 6.0–8.3)

## 2016-12-23 MED ORDER — AMITRIPTYLINE HCL 25 MG PO TABS: 25.0000 mg | ORAL_TABLET | Freq: Every day | ORAL | 1 refills | Status: DC

## 2016-12-23 MED ORDER — LOSARTAN POTASSIUM 50 MG PO TABS: 50.0000 mg | ORAL_TABLET | Freq: Every day | ORAL | 1 refills | Status: DC

## 2016-12-23 MED ORDER — LOSARTAN POTASSIUM 50 MG PO TABS: 50.0000 mg | ORAL_TABLET | Freq: Every day | ORAL | 0 refills | Status: DC

## 2016-12-23 MED ORDER — FUROSEMIDE 20 MG PO TABS: ORAL_TABLET | ORAL | 1 refills | Status: DC

## 2016-12-23 NOTE — Progress Notes (Signed)
Pre visit review using our clinic review tool, if applicable. No additional management support is needed unless otherwise documented below in the visit note. 

## 2016-12-23 NOTE — Patient Instructions (Signed)
Please complete lab work prior to leaving.   

## 2016-12-23 NOTE — Progress Notes (Signed)
Subjective:    Patient ID: Matthew Lee, male    DOB: 1956/04/01, 61 y.o.   MRN: [id number]  HPI  Matthew Lee is a 61 yr old male who presents today for follow up of his hypertension.  His current blood pressure medications include amlodipine, and labetalol.  Denies swellign BP Readings from Last 3 Encounters:  12/23/16 (!) 155/84  04/01/16 (!) 150/108  08/13/15 (!) 130/92   Wt Readings from Last 3 Encounters:  12/23/16 197 lb 3.2 oz (89.4 kg)  04/01/16 194 lb 12.8 oz (88.4 kg)  08/13/15 195 lb 12.8 oz (88.8 kg)      Review of Systems    see HPI  Past Medical History:  Diagnosis Date  . Headache(784.0)   . Hepatitis C 08/24/2012  . History of tobacco abuse   . Hypertension    long standing since 2007, was treated for sometime, but then discontinued taking meds on his own.  . Pulmonary edema, acute (HCC)    hospitalized in 09/2009 with hypertensive crisis and acute pulmonary edema- improved with lasix nad labetalol.     Social History   Social History  . Marital status: Married    Spouse name: N/A  . Number of children: N/A  . Years of education: N/A   Occupational History  . 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   [employer] ENGINEER [employer]   Social History Main Topics  . Smoking status: Former Smoker    Packs/day: 1.00    Years: 40.00    Types: Cigarettes    Quit date: 09/23/2009  . Smokeless tobacco: Never Used  . Alcohol use No     Comment: hx of heavy ETOH abuse  . Drug use: No  . Sexual activity: Not on file   Other Topics Concern  . Not on file   Social History Narrative   Lives at home with wife, daughter and her 2 children.   Caffeine use:  1 daily   Works a Aeronautical engineer (Museum/gallery conservator)   Enjoys Optician, dispensing, Civil engineer, contracting.          Past Surgical History:  Procedure Laterality Date  . ESOPHAGOGASTRODUODENOSCOPY  08/21/2011   Procedure: ESOPHAGOGASTRODUODENOSCOPY (EGD);  Surgeon: Charna Elizabeth, MD;  Location:  [facility];  Service: Endoscopy;  Laterality: N/A;  . TONSILLECTOMY      Family History  Problem Relation Age of Onset  . Polycystic kidney disease Father     No Known Allergies  Current Outpatient Prescriptions on File Prior to Visit  Medication Sig Dispense Refill  . amitriptyline (ELAVIL) 25 MG tablet Take 1 tablet (25 mg total) by mouth at bedtime. 30 tablet 0  . amLODipine (NORVASC) 10 MG tablet TAKE 1 TABLET DAILY 90 tablet 0  . aspirin EC 81 MG tablet Take 81 mg by mouth daily.    . furosemide (LASIX) 20 MG tablet TAKE 1 TABLET TWICE A DAY, NEEDS APPOINTMENT FOR LABS AND/OR EVALUATION FOR REFILLS 180 tablet 0  . labetalol (NORMODYNE) 100 MG tablet TAKE ONE AND ONE-HALF TABLETS TWICE A DAY 90 tablet 0   No current facility-administered medications on file prior to visit.     BP (!) 155/84 (BP Location: Right Arm, Patient Position: Sitting, Cuff Size: Normal)   Pulse 88   Temp 98.5 F (36.9 C) (Oral)   Resp 16   Ht 5\' 7"  (1.702 m)   Wt 197 lb 3.2 oz (89.4 kg)   SpO2 93%   BMI 30.89 kg/m  Objective:   Physical Exam  Constitutional: He is oriented to person, place, and time. He appears well-developed and well-nourished. No distress.  HENT:  Head: Normocephalic and atraumatic.  Cardiovascular: Normal rate and regular rhythm.   No murmur heard. Pulmonary/Chest: Effort normal and breath sounds normal. No respiratory distress. He has no wheezes. He has no rales.  Musculoskeletal: He exhibits no edema.  Neurological: He is alert and oriented to person, place, and time.  Skin: Skin is warm and dry.  Psychiatric: He has a normal mood and affect. His behavior is normal. Thought content normal.          Assessment & Plan:  HTN- uncontrolled. Add losartan, check cmet, discussed importance of weight loss.

## 2017-01-04 ENCOUNTER — Other Ambulatory Visit: Payer: Self-pay | Admitting: Family

## 2017-01-05 NOTE — Telephone Encounter (Signed)
Amlodipine refill sent. Pt last seen 12/23/16 and was advised cpe on 01/20/17. Please call pt to schedule appt. Melissa will also be following up on the blood pressure medication changes that she made at last visit. Please call pt to schedule appt. Thanks!

## 2017-01-12 ENCOUNTER — Encounter: Payer: Self-pay | Admitting: Family

## 2017-01-12 ENCOUNTER — Other Ambulatory Visit: Payer: Self-pay | Admitting: Family

## 2017-01-12 MED ORDER — LABETALOL HCL 100 MG PO TABS: ORAL_TABLET | ORAL | 1 refills | Status: DC

## 2017-01-12 NOTE — Telephone Encounter (Signed)
Labetalol refill sent to [facility] and mychart message sent to pt.

## 2017-01-12 NOTE — Telephone Encounter (Signed)
Caller name: Euell  Relation to pt: self  Call back number: [phone] [facility]acy: [facility]   Reason for call: Pt came in office stating that has been taking bp medication but states that losartan is not working for him since his bp has been going up, pt wanted to know if he can continue with labetalol (NORMODYNE) 100 MG tablet. Pt mentioned still has some pills of  labetalol left with him and has been taken that med instead of losartan. If possible to send in [facility] (pt will be out of meds soon). Please advise.

## 2017-01-12 NOTE — Telephone Encounter (Signed)
Attempted to reach pt and spoke with pt's spouse. She reports that pt told her he was not going to take his BP medication this morning and see if BP would still be elevated and it was. Reports that pt is at work and will not be home until after 5:30pm. Advised her to ask pt to return my call with list of BP readings at home and went ahead and scheduled f/u in the office for 01/18/17 at 7:45am to discuss further changes.

## 2017-01-12 NOTE — Telephone Encounter (Signed)
Matthew Lee--pt indicated in a previous email that we had stopped his labetalol and started losartan. Last OV just says to add Losartan. Was he supposed to have stopped his labetalol?

## 2017-01-12 NOTE — Telephone Encounter (Signed)
See 01/12/17 phone note.

## 2017-01-12 NOTE — Telephone Encounter (Signed)
My records show he should continue labetalol and start losartan. OK to send refill to pharmacy of his choice.

## 2017-01-15 NOTE — Telephone Encounter (Signed)
Pt has been scheduled.  °

## 2017-01-18 ENCOUNTER — Ambulatory Visit (INDEPENDENT_AMBULATORY_CARE_PROVIDER_SITE_OTHER): Payer: BLUE CROSS/BLUE SHIELD | Admitting: Family

## 2017-01-18 ENCOUNTER — Encounter: Payer: Self-pay | Admitting: Family

## 2017-01-18 VITALS — BP 138/79 | HR 80 | Temp 98.2°F | Resp 16 | Ht 67.0 in | Wt 197.4 lb

## 2017-01-18 DIAGNOSIS — I10 Essential (primary) hypertension: Secondary | ICD-10-CM | POA: Diagnosis not present

## 2017-01-18 DIAGNOSIS — Z0000 Encounter for general adult medical examination without abnormal findings: Secondary | ICD-10-CM

## 2017-01-18 LAB — BASIC METABOLIC PANEL
BUN: 17 mg/dL (ref 6–23)
CHLORIDE: 103 meq/L (ref 96–112)
CO2: 28 meq/L (ref 19–32)
Calcium: 9.7 mg/dL (ref 8.4–10.5)
Creatinine, Ser: 1.12 mg/dL (ref 0.40–1.50)
GFR: 70.49 mL/min (ref >60.00)
Glucose, Bld: 103 mg/dL — ABNORMAL HIGH (ref 70–99)
Potassium: 3.8 mEq/L (ref 3.5–5.1)
SODIUM: 138 meq/L (ref 135–145)

## 2017-01-18 MED ORDER — LOSARTAN POTASSIUM 50 MG PO TABS: 50.0000 mg | ORAL_TABLET | Freq: Every day | ORAL | 1 refills | Status: DC

## 2017-01-18 NOTE — Progress Notes (Signed)
Pre visit review using our clinic review tool, if applicable. No additional management support is needed unless otherwise documented below in the visit note. 

## 2017-01-18 NOTE — Patient Instructions (Addendum)
Continue current medications. Complete lab work prior to leaving.  We have placed the referral for your screening colonoscopy. Please call [phone] if you have not been contacted by them in 1 week regarding this appointment.

## 2017-01-18 NOTE — Progress Notes (Signed)
Subjective:    Patient ID: Matthew Lee, male    DOB: [birth year], 61 y.o.   MRN: [id number]  HPI  HTN- Last visit BP was noted to be elevated. Losartan was added to his regimen.  Feels like he has improved his diet which is helping.  Denies side effects to losart.  Denies CP/SOB or swelling.  BP Readings from Last 3 Encounters:  01/18/17 138/79  12/23/16 (!) 155/84  04/01/16 (!) 150/108   Wt Readings from Last 3 Encounters:  01/18/17 197 lb 6.4 oz (89.5 kg)  12/23/16 197 lb 3.2 oz (89.4 kg)  04/01/16 194 lb 12.8 oz (88.4 kg)      Review of Systems See HPI  Past Medical History:  Diagnosis Date  . Headache(784.0)   . Hepatitis C 08/24/2012  . History of tobacco abuse   . Hypertension    long standing since 2007, was treated for sometime, but then discontinued taking meds on his own.  . Pulmonary edema, acute (HCC)    hospitalized in 09/2009 with hypertensive crisis and acute pulmonary edema- improved with lasix nad labetalol.     Social History   Social History  . Marital status: Married    Spouse name: N/A  . Number of children: N/A  . Years of education: N/A   Occupational History  . 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   [employer] ENGINEER [employer]   Social History Main Topics  . Smoking status: Former Smoker    Packs/day: 1.00    Years: 40.00    Types: Cigarettes    Quit date: 09/23/2009  . Smokeless tobacco: Never Used  . Alcohol use No     Comment: hx of heavy ETOH abuse  . Drug use: No  . Sexual activity: Not on file   Other Topics Concern  . Not on file   Social History Narrative   Lives at home with wife, daughter and her 2 children.   Caffeine use:  1 daily   Works a Aeronautical engineer (Museum/gallery conservator)   Enjoys Optician, dispensing, Civil engineer, contracting.          Past Surgical History:  Procedure Laterality Date  . ESOPHAGOGASTRODUODENOSCOPY  08/21/2011   Procedure: ESOPHAGOGASTRODUODENOSCOPY (EGD);  Surgeon: Charna Elizabeth, MD;   Location: [facility];  Service: Endoscopy;  Laterality: N/A;  . TONSILLECTOMY      Family History  Problem Relation Age of Onset  . Polycystic kidney disease Father     No Known Allergies  Current Outpatient Prescriptions on File Prior to Visit  Medication Sig Dispense Refill  . amitriptyline (ELAVIL) 25 MG tablet Take 1 tablet (25 mg total) by mouth at bedtime. 90 tablet 1  . amLODipine (NORVASC) 10 MG tablet Take 1 tablet (10 mg total) by mouth daily. 90 tablet 1  . aspirin EC 81 MG tablet Take 81 mg by mouth daily.    . furosemide (LASIX) 20 MG tablet TAKE 1 TABLET TWICE A DAY 180 tablet 1  . labetalol (NORMODYNE) 100 MG tablet TAKE ONE AND ONE-HALF TABLETS TWICE A DAY 270 tablet 1  . losartan (COZAAR) 50 MG tablet Take 1 tablet (50 mg total) by mouth daily. 30 tablet 0   No current facility-administered medications on file prior to visit.     BP 138/79 (BP Location: Right Arm, Cuff Size: Large)   Pulse 80   Temp 98.2 F (36.8 C) (Oral)   Resp 16   Ht  (1.702 m)  Wt 197 lb 6.4 oz (89.5 kg)   SpO2 98% Comment: room air  BMI 30.92 kg/m       Objective:   Physical Exam  Constitutional: He is oriented to person, place, and time. He appears well-developed and well-nourished. No distress.  HENT:  Head: Normocephalic and atraumatic.  Cardiovascular: Normal rate and regular rhythm.   No murmur heard. Pulmonary/Chest: Effort normal and breath sounds normal. No respiratory distress. He has no wheezes. He has no rales.  Musculoskeletal: He exhibits no edema.  Neurological: He is alert and oriented to person, place, and time.  Skin: Skin is warm and dry.  Psychiatric: He has a normal mood and affect. His behavior is normal. Thought content normal.          Assessment & Plan:  HTN- BP now at goal. Continue losartan. Encouraged patient to continue healthy diet and to work on adding in 30 minutes of exercise such as walking once a day.Obtain follow up bmet to assess  K+ and renal function.

## 2017-02-25 ENCOUNTER — Encounter: Payer: Self-pay | Admitting: Family

## 2017-06-21 ENCOUNTER — Other Ambulatory Visit: Payer: Self-pay | Admitting: Family

## 2017-06-21 NOTE — Telephone Encounter (Signed)
Pt was advised to F/U in July/must sched appt first/thx dmf

## 2017-07-04 ENCOUNTER — Other Ambulatory Visit: Payer: Self-pay | Admitting: Family

## 2017-07-05 NOTE — Telephone Encounter (Signed)
Rx approved and sent to the pharmacy by e-script.  Patient needs an office visit before further refills.//AB/CMA

## 2017-07-11 ENCOUNTER — Other Ambulatory Visit: Payer: Self-pay | Admitting: Family

## 2017-08-15 ENCOUNTER — Other Ambulatory Visit: Payer: Self-pay | Admitting: Family

## 2017-08-16 NOTE — Telephone Encounter (Signed)
DENIAL SENT TO THE PHARMACY, PT NEEDS AN APPOINTMENT.

## 2017-08-17 NOTE — Telephone Encounter (Signed)
Letter mailed out to patient.

## 2017-09-13 ENCOUNTER — Ambulatory Visit: Payer: BLUE CROSS/BLUE SHIELD | Admitting: Family

## 2017-09-14 ENCOUNTER — Encounter: Payer: Self-pay | Admitting: Family

## 2017-09-14 NOTE — Telephone Encounter (Signed)
Called pt and left voicemail to call to reschedule his appt.

## 2017-09-14 NOTE — Telephone Encounter (Signed)
Shanea-- please call pt to R/S the appt he missed with Melissa. Thanks!

## 2017-10-11 ENCOUNTER — Other Ambulatory Visit: Payer: Self-pay | Admitting: Family

## 2017-10-11 MED ORDER — LABETALOL HCL 100 MG PO TABS: ORAL_TABLET | ORAL | 0 refills | Status: DC

## 2017-10-11 NOTE — Telephone Encounter (Signed)
Called pt and LVM to return our call and schedule a follow up visit for any further refills.

## 2017-10-11 NOTE — Telephone Encounter (Signed)
Pt was due for follow up with Melissa in July and is past due. He missed his appt in December. I have sent a 2 week supply of labetalol to his local [facility].  He needs to be seen as soon as possible before we can send a refill to his mail order company.  Please call pt to schedule appt with Melissa soon.  Thanks!

## 2018-01-01 ENCOUNTER — Other Ambulatory Visit: Payer: Self-pay | Admitting: Family

## 2018-01-05 ENCOUNTER — Ambulatory Visit: Payer: BLUE CROSS/BLUE SHIELD | Admitting: Family

## 2018-01-05 ENCOUNTER — Encounter: Payer: Self-pay | Admitting: Family

## 2018-01-05 DIAGNOSIS — R109 Unspecified abdominal pain: Secondary | ICD-10-CM | POA: Diagnosis not present

## 2018-01-05 DIAGNOSIS — Z1211 Encounter for screening for malignant neoplasm of colon: Secondary | ICD-10-CM

## 2018-01-05 DIAGNOSIS — R1030 Lower abdominal pain, unspecified: Secondary | ICD-10-CM

## 2018-01-05 LAB — POC URINALSYSI DIPSTICK (AUTOMATED)
BILIRUBIN UA: NEGATIVE
GLUCOSE UA: NEGATIVE
KETONES UA: NEGATIVE
Leukocytes, UA: NEGATIVE
Nitrite, UA: NEGATIVE
Protein, UA: NEGATIVE
RBC UA: NEGATIVE
UROBILINOGEN UA: 0.2 U/dL
pH, UA: 6 (ref 5.0–8.0)

## 2018-01-05 LAB — CBC WITH DIFFERENTIAL/PLATELET
BASOS PCT: 0.4 % (ref 0.0–3.0)
Basophils Absolute: 0.1 10*3/uL (ref 0.0–0.1)
EOS ABS: 0.3 10*3/uL (ref 0.0–0.7)
EOS PCT: 1.9 % (ref 0.0–5.0)
HCT: 42.7 % (ref 39.0–52.0)
Hemoglobin: 13.9 g/dL (ref 13.0–17.0)
LYMPHS ABS: 1.8 10*3/uL (ref 0.7–4.0)
Lymphocytes Relative: 12.3 % (ref 12.0–46.0)
MCHC: 32.5 g/dL (ref 30.0–36.0)
MCV: 85 fl (ref 78.0–100.0)
MONO ABS: 1.6 10*3/uL — AB (ref 0.1–1.0)
Monocytes Relative: 10.6 % (ref 3.0–12.0)
NEUTROS ABS: 11 10*3/uL — AB (ref 1.4–7.7)
NEUTROS PCT: 74.8 % (ref 43.0–77.0)
PLATELETS: 369 10*3/uL (ref 150.0–400.0)
RBC: 5.02 Mil/uL (ref 4.22–5.81)
RDW: 14 % (ref 11.5–15.5)
WBC: 14.8 10*3/uL — AB (ref 4.0–10.5)

## 2018-01-05 LAB — COMPREHENSIVE METABOLIC PANEL
ALBUMIN: 3.8 g/dL (ref 3.5–5.2)
ALK PHOS: 75 U/L (ref 39–117)
ALT: 17 U/L (ref 0–53)
AST: 16 U/L (ref 0–37)
BUN: 19 mg/dL (ref 6–23)
CO2: 28 mEq/L (ref 19–32)
CREATININE: 0.9 mg/dL (ref 0.40–1.50)
Calcium: 9.5 mg/dL (ref 8.4–10.5)
Chloride: 105 mEq/L (ref 96–112)
GFR: 90.44 mL/min (ref >60.00)
Glucose, Bld: 114 mg/dL — ABNORMAL HIGH (ref 70–99)
Potassium: 4.9 mEq/L (ref 3.5–5.1)
Sodium: 138 mEq/L (ref 135–145)
TOTAL PROTEIN: 7.7 g/dL (ref 6.0–8.3)
Total Bilirubin: 0.4 mg/dL (ref 0.2–1.2)

## 2018-01-05 MED ORDER — AMLODIPINE BESYLATE 10 MG PO TABS: 10.0000 mg | ORAL_TABLET | Freq: Every day | ORAL | 1 refills | Status: DC

## 2018-01-05 MED ORDER — LOSARTAN POTASSIUM 50 MG PO TABS: 50.0000 mg | ORAL_TABLET | Freq: Every day | ORAL | 1 refills | Status: DC

## 2018-01-05 MED ORDER — METRONIDAZOLE 500 MG PO TABS
500.0000 mg | ORAL_TABLET | Freq: Three times a day (TID) | ORAL | 0 refills | Status: DC
Start: 2018-01-05 — End: 2018-01-17

## 2018-01-05 MED ORDER — CIPROFLOXACIN HCL 500 MG PO TABS: 500.0000 mg | ORAL_TABLET | Freq: Two times a day (BID) | ORAL | 0 refills | Status: DC

## 2018-01-05 MED ORDER — LABETALOL HCL 100 MG PO TABS: ORAL_TABLET | ORAL | 1 refills | Status: DC

## 2018-01-05 MED ORDER — LABETALOL HCL 100 MG PO TABS: ORAL_TABLET | ORAL | 0 refills | Status: DC

## 2018-01-05 MED ORDER — FUROSEMIDE 20 MG PO TABS
ORAL_TABLET | ORAL | 1 refills | Status: DC
Start: 2018-01-05 — End: 2018-01-05

## 2018-01-05 MED ORDER — AMITRIPTYLINE HCL 25 MG PO TABS: 25.0000 mg | ORAL_TABLET | Freq: Every day | ORAL | 1 refills | Status: DC

## 2018-01-05 NOTE — Telephone Encounter (Signed)
Reviewed patient's chart.  White blood cell count is elevated at 14.  Noted that CT was not completed.  Contacted patient.  He states that there was an issue scheduling due to insurance approval.  I advised patient to begin antibiotics tonight.  Prescription was sent for Cipro and Flagyl to cover patient for diverticulitis.  Will work on getting CT scheduled for tomorrow.  He understands that he should go to the emergency department if symptoms worsen overnight.  Patient verbalizes understanding.  Gwen and  [place], can we please get this scheduled ASAP tomorrow morning?

## 2018-01-05 NOTE — Patient Instructions (Addendum)
Please complete lab work prior to leaving.  Return this afternoon to complete CT scan of your abdomen.

## 2018-01-05 NOTE — Progress Notes (Signed)
Subjective:    Patient ID: Matthew Lee, male    DOB: November 27, 1955, 62 y.o.   MRN: [id number]  HPI  Patient is a 62 yr old male who presents today with chief complaint of abdominal pain. Pain is located in the lower abdomen and began on 12/31/08. Reports that appetite has been poor. Has some new dentures which he is getting used to.  He reports that he his abdominal pain is cramping and intermittent. Last BM yesterday- had taken laxative prior.    Denies dysuria, frequency or any voiding difficulties.    HTN- reports  BP Readings from Last 3 Encounters:  01/05/18 (!) 145/73  01/18/17 138/79  12/23/16 (!) 155/84     Max temp 100.7.    Review of Systems    see HPI  Past Medical History:  Diagnosis Date  . Headache(784.0)   . Hepatitis C 08/24/2012  . History of tobacco abuse   . Hypertension    long standing since 2007, was treated for sometime, but then discontinued taking meds on his own.  . Pulmonary edema, acute (HCC)    hospitalized in 09/2009 with hypertensive crisis and acute pulmonary edema- improved with lasix nad labetalol.     Social History   Socioeconomic History  . Marital status: Married    Spouse name: Not on file  . Number of children: Not on file  . Years of education: Not on file  . Highest education level: Not on file  Occupational History  . 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  . Occupation: ENGINEER    Employer: [employer]  Social Needs  . Financial resource strain: Not on file  . Food insecurity:    Worry: Not on file    Inability: Not on file  . Transportation needs:    Medical: Not on file    Non-medical: Not on file  Tobacco Use  . Smoking status: Former Smoker    Packs/day: 1.00    Years: 40.00    Pack years: 40.00    Types: Cigarettes    Last attempt to quit: 09/23/2009    Years since quitting: 8.2  . Smokeless tobacco: Never Used  Substance and Sexual Activity  . Alcohol use: No    Comment:  hx of heavy ETOH abuse  . Drug use: No  . Sexual activity: Not on file  Lifestyle  . Physical activity:    Days per week: Not on file    Minutes per session: Not on file  . Stress: Not on file  Relationships  . Social connections:    Talks on phone: Not on file    Gets together: Not on file    Attends religious service: Not on file    Active member of club or organization: Not on file    Attends meetings of clubs or organizations: Not on file    Relationship status: Not on file  . Intimate partner violence:    Fear of current or ex partner: Not on file    Emotionally abused: Not on file    Physically abused: Not on file    Forced sexual activity: Not on file  Other Topics Concern  . Not on file  Social History Narrative   Lives at home with wife, daughter and her 2 children.   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  Past Surgical History:  Procedure Laterality Date  . ESOPHAGOGASTRODUODENOSCOPY  08/21/2011   Procedure: ESOPHAGOGASTRODUODENOSCOPY (EGD);  Surgeon: Charna Elizabeth, MD;  Location: [facility];  Service: Endoscopy;  Laterality: N/A;  . TONSILLECTOMY      Family History  Problem Relation Age of Onset  . Polycystic kidney disease Father     No Known Allergies  Current Outpatient Medications on File Prior to Visit  Medication Sig Dispense Refill  . amitriptyline (ELAVIL) 25 MG tablet Take 1 tablet (25 mg total) by mouth at bedtime. 90 tablet 1  . amLODipine (NORVASC) 10 MG tablet TAKE 1 TABLET DAILY 90 tablet 1  . aspirin EC 81 MG tablet Take 81 mg by mouth daily.    . furosemide (LASIX) 20 MG tablet TAKE 1 TABLET TWICE A DAY 180 tablet 1  . labetalol (NORMODYNE) 100 MG tablet TAKE ONE AND ONE-HALF TABLETS TWICE A DAY 42 tablet 0  . losartan (COZAAR) 50 MG tablet Take 1 tablet (50 mg total) by mouth daily. 90 tablet 1   No current facility-administered medications on file  prior to visit.     BP (!) 145/73 (BP Location: Right Arm, Patient Position: Sitting, Cuff Size: Small)   Pulse 87   Temp 98.3 F (36.8 C) (Oral)   Resp 16   Ht 5\' 7"  (1.702 m)   Wt 183 lb 9.6 oz (83.3 kg)   SpO2 97%   BMI 28.76 kg/m    Objective:   Physical Exam  Constitutional: He is oriented to person, place, and time. He appears well-developed and well-nourished. No distress.  HENT:  Head: Normocephalic and atraumatic.  Cardiovascular: Normal rate and regular rhythm.  No murmur heard. Pulmonary/Chest: Effort normal and breath sounds normal. No respiratory distress. He has no wheezes. He has no rales.  Abdominal: Soft. Bowel sounds are normal. He exhibits no distension.  Positive suprapubic tenderness  Musculoskeletal: He exhibits no edema.  Neurological: He is alert and oriented to person, place, and time.  Skin: Skin is warm and dry.  Psychiatric: He has a normal mood and affect. His behavior is normal. Thought content normal.          Assessment & Plan:  Lower abdominal pain-suspect diverticulitis.  Will initiate Cipro and metronidazole.  Obtain CT of the abdomen and pelvis to further evaluate.  Patient is advised to go to the ER if worsening abdominal pain.  Patient verbalizes understanding.

## 2018-01-06 ENCOUNTER — Ambulatory Visit (HOSPITAL_BASED_OUTPATIENT_CLINIC_OR_DEPARTMENT_OTHER)
Admission: RE | Admit: 2018-01-06 | Discharge: 2018-01-06 | Disposition: A | Discharge status: Home / Self Care | Payer: BLUE CROSS/BLUE SHIELD | Source: Ambulatory Visit | Attending: Family | Admitting: Family

## 2018-01-06 ENCOUNTER — Encounter (HOSPITAL_BASED_OUTPATIENT_CLINIC_OR_DEPARTMENT_OTHER): Payer: Self-pay

## 2018-01-06 ENCOUNTER — Encounter: Payer: Self-pay | Admitting: Gastroenterology

## 2018-01-06 DIAGNOSIS — R1030 Lower abdominal pain, unspecified: Secondary | ICD-10-CM | POA: Diagnosis not present

## 2018-01-06 DIAGNOSIS — R109 Unspecified abdominal pain: Secondary | ICD-10-CM | POA: Diagnosis not present

## 2018-01-06 MED ORDER — IOPAMIDOL (ISOVUE-300) INJECTION 61%
100.0000 mL | Freq: Once | INTRAVENOUS | Status: AC | PRN
Start: 2018-01-06 — End: 2018-01-06
  Administered 2018-01-06: 100 mL via INTRAVENOUS

## 2018-01-06 NOTE — Telephone Encounter (Signed)
Scheduled for today.

## 2018-01-17 ENCOUNTER — Ambulatory Visit: Payer: BLUE CROSS/BLUE SHIELD | Admitting: Family

## 2018-01-17 ENCOUNTER — Telehealth: Payer: Self-pay | Admitting: Family

## 2018-01-17 ENCOUNTER — Encounter: Payer: Self-pay | Admitting: Family

## 2018-01-17 VITALS — BP 123/71 | HR 89 | Temp 97.6°F | Resp 18 | Ht 67.0 in | Wt 183.8 lb

## 2018-01-17 DIAGNOSIS — K5792 Diverticulitis of intestine, part unspecified, without perforation or abscess without bleeding: Secondary | ICD-10-CM

## 2018-01-17 DIAGNOSIS — D72829 Elevated white blood cell count, unspecified: Secondary | ICD-10-CM

## 2018-01-17 LAB — CBC WITH DIFFERENTIAL/PLATELET
BASOS ABS: 0.1 10*3/uL (ref 0.0–0.1)
Basophils Relative: 0.4 % (ref 0.0–3.0)
EOS PCT: 0.6 % (ref 0.0–5.0)
Eosinophils Absolute: 0.1 10*3/uL (ref 0.0–0.7)
HEMATOCRIT: 43 % (ref 39.0–52.0)
HEMOGLOBIN: 14.1 g/dL (ref 13.0–17.0)
LYMPHS PCT: 9.6 % — AB (ref 12.0–46.0)
Lymphs Abs: 1.5 10*3/uL (ref 0.7–4.0)
MCHC: 32.9 g/dL (ref 30.0–36.0)
MCV: 84.3 fl (ref 78.0–100.0)
MONOS PCT: 7.8 % (ref 3.0–12.0)
Monocytes Absolute: 1.3 10*3/uL — ABNORMAL HIGH (ref 0.1–1.0)
NEUTROS PCT: 81.6 % — AB (ref 43.0–77.0)
Neutro Abs: 13.1 10*3/uL — ABNORMAL HIGH (ref 1.4–7.7)
Platelets: 399 10*3/uL (ref 150.0–400.0)
RBC: 5.1 Mil/uL (ref 4.22–5.81)
RDW: 14.1 % (ref 11.5–15.5)
WBC: 16.1 10*3/uL — ABNORMAL HIGH (ref 4.0–10.5)

## 2018-01-17 MED ORDER — CIPROFLOXACIN HCL 500 MG PO TABS: 500.0000 mg | ORAL_TABLET | Freq: Two times a day (BID) | ORAL | 0 refills | Status: DC

## 2018-01-17 MED ORDER — METRONIDAZOLE 500 MG PO TABS: 500.0000 mg | ORAL_TABLET | Freq: Three times a day (TID) | ORAL | 0 refills | Status: DC

## 2018-01-17 NOTE — Telephone Encounter (Signed)
White blood cell count is still elevated.  I would recommend additional 1 week of antibiotics and the repeat cbc with diff in 1 week. Call if fever, abdominal pain. [facility]flagyl.

## 2018-01-17 NOTE — Telephone Encounter (Signed)
Left detailed message on pt's home # and to call tomorrow to schedule lab appt. Future lab order entered.

## 2018-01-17 NOTE — Progress Notes (Signed)
Subjective:    Patient ID: Matthew Lee, male    DOB: Nov 16, 1955, 62 y.o.   MRN: [id number]  HPI   Matthew Lee is a 62 yr old male who presents today for follow up of his diverticulitis.  He was initially seen on January 05, 2018 with lower abdominal pain. Lab work revealed a leukocytosis with a white blood cell count of 14.8.  CT of the abdomen and pelvis was performed which noted findings consistent with acute sigmoid colon diverticulitis.  No perforation or abscess was noted.  The patient was treated with Cipro and Flagyl for 10-day course.  This was completed 2 days ago. Reports that his bowels have returned to normal. No fevers.  Denies abdominal pain.    Review of Systems    See HPI  Past Medical History:  Diagnosis Date  . Headache(784.0)   . Hepatitis C 08/24/2012  . History of tobacco abuse   . Hypertension    long standing since 2007, was treated for sometime, but then discontinued taking meds on his own.  . Pulmonary edema, acute (HCC)    hospitalized in 09/2009 with hypertensive crisis and acute pulmonary edema- improved with lasix nad labetalol.     Social History   Socioeconomic History  . Marital status: Married    Spouse name: Not on file  . Number of children: Not on file  . Years of education: Not on file  . Highest education level: Not on file  Occupational History  . 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  . Occupation: ENGINEER    Employer: [employer]  Social Needs  . Financial resource strain: Not on file  . Food insecurity:    Worry: Not on file    Inability: Not on file  . Transportation needs:    Medical: Not on file    Non-medical: Not on file  Tobacco Use  . Smoking status: Former Smoker    Packs/day: 1.00    Years: 40.00    Pack years: 40.00    Types: Cigarettes    Last attempt to quit: 09/23/2009    Years since quitting: 8.3  . Smokeless tobacco: Never Used  Substance and Sexual Activity  . Alcohol use: No     Comment: hx of heavy ETOH abuse  . Drug use: No  . Sexual activity: Not on file  Lifestyle  . Physical activity:    Days per week: Not on file    Minutes per session: Not on file  . Stress: Not on file  Relationships  . Social connections:    Talks on phone: Not on file    Gets together: Not on file    Attends religious service: Not on file    Active member of club or organization: Not on file    Attends meetings of clubs or organizations: Not on file    Relationship status: Not on file  . Intimate partner violence:    Fear of current or ex partner: Not on file    Emotionally abused: Not on file    Physically abused: Not on file    Forced sexual activity: Not on file  Other Topics Concern  . Not on file  Social History Narrative   Lives at home with wife, daughter and her 2 children.   Caffeine use:  1 daily   Works a Aeronautical engineer (Museum/gallery conservator)   Enjoys Optician, dispensing, Civil engineer, contracting.  Past Surgical History:  Procedure Laterality Date  . ESOPHAGOGASTRODUODENOSCOPY  08/21/2011   Procedure: ESOPHAGOGASTRODUODENOSCOPY (EGD);  Surgeon: Charna ElizabethJyothi Mann, MD;  Location: [facility];  Service: Endoscopy;  Laterality: N/A;  . TONSILLECTOMY      Family History  Problem Relation Age of Onset  . Polycystic kidney disease Father     No Known Allergies  Current Outpatient Medications on File Prior to Visit  Medication Sig Dispense Refill  . amitriptyline (ELAVIL) 25 MG tablet Take 1 tablet (25 mg total) by mouth at bedtime. 90 tablet 1  . amLODipine (NORVASC) 10 MG tablet Take 1 tablet (10 mg total) by mouth daily. 90 tablet 1  . aspirin EC 81 MG tablet Take 81 mg by mouth daily.    Marland Kitchen. labetalol (NORMODYNE) 100 MG tablet TAKE ONE AND ONE-HALF TABLETS TWICE A DAY 270 tablet 1  . losartan (COZAAR) 50 MG tablet Take 1 tablet (50 mg total) by mouth daily. 90 tablet 1   No current facility-administered medications on file prior to visit.     BP 123/71 (BP  Location: Right Arm, Cuff Size: Normal)   Pulse 89   Temp 97.6 F (36.4 C) (Oral)   Resp 18   Ht 5\' 7"  (1.702 m)   Wt 183 lb 12.8 oz (83.4 kg)   SpO2 97%   BMI 28.79 kg/m    Objective:   Physical Exam  Constitutional: He is oriented to person, place, and time. He appears well-developed and well-nourished. No distress.  HENT:  Head: Normocephalic and atraumatic.  Cardiovascular: Normal rate and regular rhythm.  No murmur heard. Pulmonary/Chest: Effort normal and breath sounds normal. No respiratory distress. He has no wheezes. He has no rales.  Abdominal: Soft. Bowel sounds are normal. He exhibits no distension. There is no tenderness. There is no rebound.  Musculoskeletal: He exhibits no edema.  Neurological: He is alert and oriented to person, place, and time.  Skin: Skin is warm and dry.  Psychiatric: He has a normal mood and affect. His behavior is normal. Thought content normal.          Assessment & Plan:  Diverticulitis- clinically resolved. Will check follow up cbc, expect wbc to be lower today due to clinical improvement. Advised pt to follow a high fiber diet.

## 2018-01-17 NOTE — Patient Instructions (Signed)
Please complete lab work prior to leaving. Focus on a healthy diet with lots of fiber (fresh fruits/veggies). Call if you develop recurrent abdominal pain or fever.

## 2018-01-21 ENCOUNTER — Ambulatory Visit: Payer: BLUE CROSS/BLUE SHIELD | Admitting: Family

## 2018-01-21 ENCOUNTER — Ambulatory Visit (HOSPITAL_BASED_OUTPATIENT_CLINIC_OR_DEPARTMENT_OTHER)
Admission: RE | Admit: 2018-01-21 | Discharge: 2018-01-21 | Disposition: A | Discharge status: Home / Self Care | Payer: BLUE CROSS/BLUE SHIELD | Source: Ambulatory Visit | Attending: Family | Admitting: Family

## 2018-01-21 ENCOUNTER — Encounter: Payer: Self-pay | Admitting: Family

## 2018-01-21 ENCOUNTER — Encounter (HOSPITAL_BASED_OUTPATIENT_CLINIC_OR_DEPARTMENT_OTHER): Payer: Self-pay

## 2018-01-21 DIAGNOSIS — I700 Atherosclerosis of aorta: Secondary | ICD-10-CM | POA: Insufficient documentation

## 2018-01-21 DIAGNOSIS — K5732 Diverticulitis of large intestine without perforation or abscess without bleeding: Secondary | ICD-10-CM | POA: Diagnosis not present

## 2018-01-21 DIAGNOSIS — K5792 Diverticulitis of intestine, part unspecified, without perforation or abscess without bleeding: Secondary | ICD-10-CM

## 2018-01-21 DIAGNOSIS — K5730 Diverticulosis of large intestine without perforation or abscess without bleeding: Secondary | ICD-10-CM | POA: Insufficient documentation

## 2018-01-21 DIAGNOSIS — D72829 Elevated white blood cell count, unspecified: Secondary | ICD-10-CM | POA: Diagnosis not present

## 2018-01-21 MED ORDER — IOPAMIDOL (ISOVUE-300) INJECTION 61%
100.0000 mL | Freq: Once | INTRAVENOUS | Status: AC | PRN
  Administered 2018-01-21: 100 mL via INTRAVENOUS

## 2018-01-21 NOTE — Progress Notes (Signed)
Subjective:    Patient ID: Matthew Lee, male    DOB: 09/18/1956, 62 y.o.   MRN: [id number]  HPI Patient is a 62 year old male who presents today for follow-up of his diverticulitis. Last visit was his 1 week follow-up and he had completed a round of Cipro and Flagyl.  He had clinically improved however his leukocytosis persisted.  We continued an additional week of Cipro and Flagyl. He reports more bloating. Last BM 5 days ago. Denies fevers.  Denies Lower abdominal pain.  He does report some right-sided abdominal pain.  He has had some weight loss since his last visit.  Wt Readings from Last 3 Encounters:  01/21/18 177 lb 9.6 oz (80.6 kg)  01/17/18 183 lb 12.8 oz (83.4 kg)  01/05/18 183 lb 9.6 oz (83.3 kg)      Review of Systems See HPI  Past Medical History:  Diagnosis Date  . Headache(784.0)   . Hepatitis C 08/24/2012  . History of tobacco abuse   . Hypertension    long standing since 2007, was treated for sometime, but then discontinued taking meds on his own.  . Pulmonary edema, acute (HCC)    hospitalized in 09/2009 with hypertensive crisis and acute pulmonary edema- improved with lasix nad labetalol.     Social History   Socioeconomic History  . Marital status: Married    Spouse name: Not on file  . Number of children: Not on file  . Years of education: Not on file  . Highest education level: Not on file  Occupational History  . 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  . Occupation: ENGINEER    Employer: [employer]  Social Needs  . Financial resource strain: Not on file  . Food insecurity:    Worry: Not on file    Inability: Not on file  . Transportation needs:    Medical: Not on file    Non-medical: Not on file  Tobacco Use  . Smoking status: Former Smoker    Packs/day: 1.00    Years: 40.00    Pack years: 40.00    Types: Cigarettes    Last attempt to quit: 09/23/2009    Years since quitting: 8.3  . Smokeless tobacco:  Never Used  Substance and Sexual Activity  . Alcohol use: No    Comment: hx of heavy ETOH abuse  . Drug use: No  . Sexual activity: Not on file  Lifestyle  . Physical activity:    Days per week: Not on file    Minutes per session: Not on file  . Stress: Not on file  Relationships  . Social connections:    Talks on phone: Not on file    Gets together: Not on file    Attends religious service: Not on file    Active member of club or organization: Not on file    Attends meetings of clubs or organizations: Not on file    Relationship status: Not on file  . Intimate partner violence:    Fear of current or ex partner: Not on file    Emotionally abused: Not on file    Physically abused: Not on file    Forced sexual activity: Not on file  Other Topics Concern  . Not on file  Social History Narrative   Lives at home with wife, daughter and her 2 children.   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          Past Surgical History:  Procedure Laterality Date  . ESOPHAGOGASTRODUODENOSCOPY  08/21/2011   Procedure: ESOPHAGOGASTRODUODENOSCOPY (EGD);  Surgeon: Charna Elizabeth, MD;  Location: [facility];  Service: Endoscopy;  Laterality: N/A;  . TONSILLECTOMY      Family History  Problem Relation Age of Onset  . Polycystic kidney disease Father     No Known Allergies  Current Outpatient Medications on File Prior to Visit  Medication Sig Dispense Refill  . amitriptyline (ELAVIL) 25 MG tablet Take 1 tablet (25 mg total) by mouth at bedtime. 90 tablet 1  . amLODipine (NORVASC) 10 MG tablet Take 1 tablet (10 mg total) by mouth daily. 90 tablet 1  . aspirin EC 81 MG tablet Take 81 mg by mouth daily.    . ciprofloxacin (CIPRO) 500 MG tablet Take 1 tablet (500 mg total) by mouth 2 (two) times daily. 14 tablet 0  . labetalol (NORMODYNE) 100 MG tablet TAKE ONE AND ONE-HALF TABLETS TWICE A DAY 270 tablet 1  . losartan  (COZAAR) 50 MG tablet Take 1 tablet (50 mg total) by mouth daily. 90 tablet 1  . metroNIDAZOLE (FLAGYL) 500 MG tablet Take 1 tablet (500 mg total) by mouth 3 (three) times daily. 21 tablet 0   No current facility-administered medications on file prior to visit.     BP (!) 158/98 (BP Location: Right Arm, Cuff Size: Normal)   Pulse 93   Temp 98.3 F (36.8 C) (Oral)   Resp 18   Ht 5\' 7"  (1.702 m)   Wt 177 lb 9.6 oz (80.6 kg)   SpO2 97%   BMI 27.82 kg/m       Objective:   Physical Exam  Constitutional: He is oriented to person, place, and time. He appears well-developed and well-nourished. No distress.  HENT:  Head: Normocephalic and atraumatic.  Cardiovascular: Normal rate and regular rhythm.  No murmur heard. Pulmonary/Chest: Effort normal and breath sounds normal. No respiratory distress. He has no wheezes. He has no rales.  Abdominal: Bowel sounds are normal. He exhibits no distension and no mass. There is tenderness in the right upper quadrant and right lower quadrant.  Minimal guarding is noted  Musculoskeletal: He exhibits no edema.  Neurological: He is alert and oriented to person, place, and time.  Skin: Skin is warm and dry.  Psychiatric: He has a normal mood and affect. His behavior is normal. Thought content normal.          Assessment & Plan:  Diverticulitis- due to ongoing abdominal pain and constipation a follow-up CT of the abdomen and pelvis is performed.  CT notes resolution of diverticulitis.   Leukocytosis- Follow-up CBC is performed and notes normalization of his white blood cell count.  Patient is advised to call if new or worsening symptoms.  He is advised to maintain a high-fiber diet.

## 2018-01-21 NOTE — Addendum Note (Signed)
Addended by: Harley AltoPRICE, Semya Klinke M on: 01/21/2018 02:50 PM   Modules accepted: Orders

## 2018-01-21 NOTE — Patient Instructions (Signed)
Please complete antibiotics. Complete CT scan on the first floor. We will contact you with your results.

## 2018-01-22 LAB — COMPREHENSIVE METABOLIC PANEL
AG Ratio: 1.2 (calc) (ref 1.0–2.5)
ALKALINE PHOSPHATASE (APISO): 64 U/L (ref 40–115)
ALT: 24 U/L (ref 9–46)
AST: 22 U/L (ref 10–35)
Albumin: 4.2 g/dL (ref 3.6–5.1)
BUN: 14 mg/dL (ref 7–25)
CHLORIDE: 102 mmol/L (ref 98–110)
CO2: 22 mmol/L (ref 20–32)
Calcium: 9.7 mg/dL (ref 8.6–10.3)
Creat: 0.85 mg/dL (ref 0.70–1.25)
GLOBULIN: 3.4 g/dL (ref 1.9–3.7)
Glucose, Bld: 93 mg/dL (ref 65–99)
Potassium: 4.4 mmol/L (ref 3.5–5.3)
Sodium: 135 mmol/L (ref 135–146)
Total Bilirubin: 0.6 mg/dL (ref 0.2–1.2)
Total Protein: 7.6 g/dL (ref 6.1–8.1)

## 2018-01-22 LAB — CBC WITH DIFFERENTIAL/PLATELET
BASOS PCT: 0.5 %
Basophils Absolute: 39 cells/uL (ref 0–200)
Eosinophils Absolute: 70 cells/uL (ref 15–500)
Eosinophils Relative: 0.9 %
HCT: 44.9 % (ref 38.5–50.0)
Hemoglobin: 15.5 g/dL (ref 13.2–17.1)
Lymphs Abs: 1973 cells/uL (ref 850–3900)
MCH: 27.6 pg (ref 27.0–33.0)
MCHC: 34.5 g/dL (ref 32.0–36.0)
MCV: 80 fL (ref 80.0–100.0)
MPV: 11.2 fL (ref 7.5–12.5)
Monocytes Relative: 10.6 %
NEUTROS ABS: 4891 {cells}/uL (ref 1500–7800)
Neutrophils Relative %: 62.7 %
PLATELETS: 453 10*3/uL — AB (ref 140–400)
RBC: 5.61 10*6/uL (ref 4.20–5.80)
RDW: 13 % (ref 11.0–15.0)
TOTAL LYMPHOCYTE: 25.3 %
WBC mixed population: 827 cells/uL (ref 200–950)
WBC: 7.8 10*3/uL (ref 3.8–10.8)

## 2018-01-23 ENCOUNTER — Encounter: Payer: Self-pay | Admitting: Family

## 2018-02-14 ENCOUNTER — Encounter: Payer: Self-pay | Admitting: *Deleted

## 2018-02-23 ENCOUNTER — Encounter: Payer: BLUE CROSS/BLUE SHIELD | Admitting: Family

## 2018-02-23 DIAGNOSIS — Z0289 Encounter for other administrative examinations: Secondary | ICD-10-CM

## 2018-02-25 ENCOUNTER — Encounter: Payer: Self-pay | Admitting: Family

## 2018-03-02 ENCOUNTER — Ambulatory Visit (AMBULATORY_SURGERY_CENTER): Payer: Self-pay | Admitting: *Deleted

## 2018-03-02 ENCOUNTER — Other Ambulatory Visit: Payer: Self-pay

## 2018-03-02 VITALS — Ht 67.0 in | Wt 183.0 lb

## 2018-03-02 DIAGNOSIS — Z1211 Encounter for screening for malignant neoplasm of colon: Secondary | ICD-10-CM

## 2018-03-02 MED ORDER — NA SULFATE-K SULFATE-MG SULF 17.5-3.13-1.6 GM/177ML PO SOLN: ORAL | 0 refills | Status: DC

## 2018-03-02 NOTE — Progress Notes (Signed)
Patient denies any allergies to eggs or soy. Patient denies any problems with anesthesia/sedation. Patient denies any oxygen use at home. Patient denies taking any diet/weight loss medications or blood thinners. EMMI education assisgned to patient on colonoscopy, this was explained and instructions given to patient. Suprep coupon printed and given to pt. Patient states he has been treated for Diverticulitis but last CT showed it was no longer present. Explained to pt to call us back if he has any medical changes or fever. Pt understands.

## 2018-03-08 ENCOUNTER — Encounter: Payer: Self-pay | Admitting: Gastroenterology

## 2018-03-16 ENCOUNTER — Encounter: Payer: BLUE CROSS/BLUE SHIELD | Admitting: Gastroenterology

## 2018-07-04 ENCOUNTER — Telehealth: Payer: Self-pay | Admitting: Family

## 2018-07-04 DIAGNOSIS — I10 Essential (primary) hypertension: Secondary | ICD-10-CM

## 2018-07-04 DIAGNOSIS — G4700 Insomnia, unspecified: Secondary | ICD-10-CM

## 2018-07-06 NOTE — Telephone Encounter (Signed)
Rx refill requests left pended for PCP review, as pt. Has not had OV since 12/2017, no pending appointments.

## 2018-07-06 NOTE — Telephone Encounter (Signed)
Refills sent.  Due for office visit. Please contact pt to schedule.

## 2018-07-13 NOTE — Telephone Encounter (Signed)
Called pt and LVM regarding his Rx refills. Informed pt that his refills were sent, however, he would need an OV before any further refills could be sent. Advised pt to call and schedule an appt at his convenience.

## 2018-10-04 ENCOUNTER — Other Ambulatory Visit: Payer: Self-pay | Admitting: Family

## 2018-10-04 DIAGNOSIS — G4700 Insomnia, unspecified: Secondary | ICD-10-CM

## 2018-10-04 DIAGNOSIS — I10 Essential (primary) hypertension: Secondary | ICD-10-CM

## 2018-12-12 ENCOUNTER — Encounter: Payer: Self-pay | Admitting: Family

## 2018-12-12 ENCOUNTER — Other Ambulatory Visit: Payer: Self-pay

## 2018-12-12 ENCOUNTER — Ambulatory Visit: Payer: BLUE CROSS/BLUE SHIELD | Admitting: Family

## 2018-12-12 ENCOUNTER — Telehealth: Payer: Self-pay | Admitting: Family

## 2018-12-12 VITALS — BP 128/78 | HR 70 | Temp 97.6°F | Resp 16 | Ht 67.0 in | Wt 191.8 lb

## 2018-12-12 DIAGNOSIS — G4700 Insomnia, unspecified: Secondary | ICD-10-CM

## 2018-12-12 DIAGNOSIS — R635 Abnormal weight gain: Secondary | ICD-10-CM | POA: Diagnosis not present

## 2018-12-12 DIAGNOSIS — I10 Essential (primary) hypertension: Secondary | ICD-10-CM | POA: Diagnosis not present

## 2018-12-12 DIAGNOSIS — K219 Gastro-esophageal reflux disease without esophagitis: Secondary | ICD-10-CM

## 2018-12-12 DIAGNOSIS — Z1211 Encounter for screening for malignant neoplasm of colon: Secondary | ICD-10-CM

## 2018-12-12 LAB — TSH: TSH: 0.96 u[IU]/mL (ref 0.35–4.50)

## 2018-12-12 LAB — COMPREHENSIVE METABOLIC PANEL
ALT: 13 U/L (ref 0–53)
AST: 12 U/L (ref 0–37)
Albumin: 4.3 g/dL (ref 3.5–5.2)
Alkaline Phosphatase: 87 U/L (ref 39–117)
BUN: 21 mg/dL (ref 6–23)
CHLORIDE: 104 meq/L (ref 96–112)
CO2: 24 mEq/L (ref 19–32)
Calcium: 9.5 mg/dL (ref 8.4–10.5)
Creatinine, Ser: 0.93 mg/dL (ref 0.40–1.50)
GFR: 81.69 mL/min (ref >60.00)
Glucose, Bld: 114 mg/dL — ABNORMAL HIGH (ref 70–99)
POTASSIUM: 4.2 meq/L (ref 3.5–5.1)
SODIUM: 137 meq/L (ref 135–145)
Total Bilirubin: 0.7 mg/dL (ref 0.2–1.2)
Total Protein: 7.4 g/dL (ref 6.0–8.3)

## 2018-12-12 NOTE — Telephone Encounter (Signed)
Order form for test faxed [facility].

## 2018-12-12 NOTE — Telephone Encounter (Signed)
Pt would like to proceed with cologuard. Please arrange.

## 2018-12-12 NOTE — Patient Instructions (Signed)
Please complete lab work prior to leaving. We will work on getting you cologuard.

## 2018-12-12 NOTE — Progress Notes (Signed)
Subjective:    Patient ID: Matthew Lee, male    DOB: [birth year], 63 y.o.   MRN: [id number]  HPI  Patient is a 63 yr old male who presents today for follow up.  HTN- maintained on amlodipine, labetalol, losartan. He denies swelling or sob. BP Readings from Last 3 Encounters:  12/12/18 128/78  01/21/18 (!) 158/98  01/17/18 123/71   Insomnia- reports that he ran out of elavil. Reports that he uses melatonin.   GERD- reports no reflux symptoms.  Weight gain- notes + weight gain since last visit.  Not exercising but plans to start exercising.  Wt Readings from Last 3 Encounters:  12/12/18 191 lb 12.8 oz (87 kg)  03/02/18 183 lb (83 kg)  01/21/18 177 lb 9.6 oz (80.6 kg)   Colon cancer screening- reports that he wishes to do cologuard in place of colonoscopy.  Review of Systems See HPI  Past Medical History:  Diagnosis Date  . Diverticulitis   . Headache(784.0)   . Hepatitis C 08/24/2012   TX and cured per pt  . History of tobacco abuse   . Hypertension    long standing since 2007, was treated for sometime, but then discontinued taking meds on his own.  . Pulmonary edema, acute (HCC)    hospitalized in 09/2009 with hypertensive crisis and acute pulmonary edema- improved with lasix nad labetalol.     Social History   Socioeconomic History  . Marital status: Married    Spouse name: Not on file  . Number of children: Not on file  . Years of education: Not on file  . Highest education level: Not on file  Occupational History  . 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  . Occupation: ENGINEER    Employer: [employer]  Social Needs  . Financial resource strain: Not on file  . Food insecurity:    Worry: Not on file    Inability: Not on file  . Transportation needs:    Medical: Not on file    Non-medical: Not on file  Tobacco Use  . Smoking status: Former Smoker    Packs/day: 1.00    Years: 40.00    Pack years: 40.00    Types:  Cigarettes    Last attempt to quit: 09/23/2009    Years since quitting: 9.2  . Smokeless tobacco: Never Used  Substance and Sexual Activity  . Alcohol use: Yes    Comment: hx of heavy ETOH abuse-1 glass wine monthly  . Drug use: No  . Sexual activity: Not on file  Lifestyle  . Physical activity:    Days per week: Not on file    Minutes per session: Not on file  . Stress: Not on file  Relationships  . Social connections:    Talks on phone: Not on file    Gets together: Not on file    Attends religious service: Not on file    Active member of club or organization: Not on file    Attends meetings of clubs or organizations: Not on file    Relationship status: Not on file  . Intimate partner violence:    Fear of current or ex partner: Not on file    Emotionally abused: Not on file    Physically abused: Not on file    Forced sexual activity: Not on file  Other Topics Concern  . Not on file  Social History Narrative   Lives at home with wife, daughter and her  2 children.   Caffeine use:  1 daily   Works a Aeronautical engineer (Museum/gallery conservator)   Enjoys Optician, dispensing, Civil engineer, contracting.          Past Surgical History:  Procedure Laterality Date  . ESOPHAGOGASTRODUODENOSCOPY  08/21/2011   Procedure: ESOPHAGOGASTRODUODENOSCOPY (EGD);  Surgeon: Charna Elizabeth, MD;  Location: [facility];  Service: Endoscopy;  Laterality: N/A;  . TONSILLECTOMY      Family History  Problem Relation Age of Onset  . Polycystic kidney disease Father   . Colon cancer Neg Hx   . Esophageal cancer Neg Hx   . Stomach cancer Neg Hx     No Known Allergies  Current Outpatient Medications on File Prior to Visit  Medication Sig Dispense Refill  . amitriptyline (ELAVIL) 25 MG tablet TAKE 1 TABLET AT BEDTIME 30 tablet 0  . amLODipine (NORVASC) 10 MG tablet TAKE 1 TABLET DAILY (NEED FURTHER EVALUATION AND/OR LAB TESTING BEFORE FURTHER REFILLS ARE GIVEN. MAKE APPOINTMENT FOR THIS) 30 tablet 0  . labetalol  (NORMODYNE) 100 MG tablet TAKE ONE AND ONE-HALF TABLETS TWICE A DAY 90 tablet 0  . losartan (COZAAR) 50 MG tablet TAKE 1 TABLET DAILY 30 tablet 0   No current facility-administered medications on file prior to visit.     BP 128/78 (BP Location: Right Arm, Patient Position: Sitting, Cuff Size: Normal)   Pulse 70   Temp 97.6 F (36.4 C) (Oral)   Resp 16   Ht 5\' 7"  (1.702 m)   Wt 191 lb 12.8 oz (87 kg)   SpO2 98%   BMI 30.04 kg/m       Objective:   Physical Exam Constitutional:      General: He is not in acute distress.    Appearance: He is well-developed.  HENT:     Head: Normocephalic and atraumatic.  Cardiovascular:     Rate and Rhythm: Normal rate and regular rhythm.     Heart sounds: No murmur.  Pulmonary:     Effort: Pulmonary effort is normal. No respiratory distress.     Breath sounds: Normal breath sounds. No wheezing or rales.  Skin:    General: Skin is warm and dry.  Neurological:     Mental Status: He is alert and oriented to person, place, and time.  Psychiatric:        Behavior: Behavior normal.        Thought Content: Thought content normal.           Assessment & Plan:  HTN- bp stable, continue current meds. Obtain follow up cmet.  Colon cancer screening- will arrange cologuard.  Insomnia-stable on prn melatonin. Ok to remain off of elavil.  Weight gain- discussed healthy diet, exercise and weight loss. Obtain TSH.  GERD- stable without medication.

## 2018-12-16 IMAGING — CT CT ABD-PELV W/ CM
2 of 5 series · 16 of 46 positions shown, 18 images · IV contrast (APPLIED)
Comparison: Ultrasound 08/22/2012

CLINICAL DATA: Lower abdominal pain with constipation

EXAM:
CT ABDOMEN AND PELVIS WITH CONTRAST
TECHNIQUE: Multidetector CT imaging of the abdomen and pelvis was performed
using the standard protocol following bolus administration of
intravenous contrast.
CONTRAST:  100mL [id number] IOPAMIDOL ([id number]) INJECTION 61%

[Series 2: axial st · axial · 0.93mm/px · z∈[-718,-218]mm · 13 of 112 slices shown, 15 images]
[im 6/112  soft-tissue]
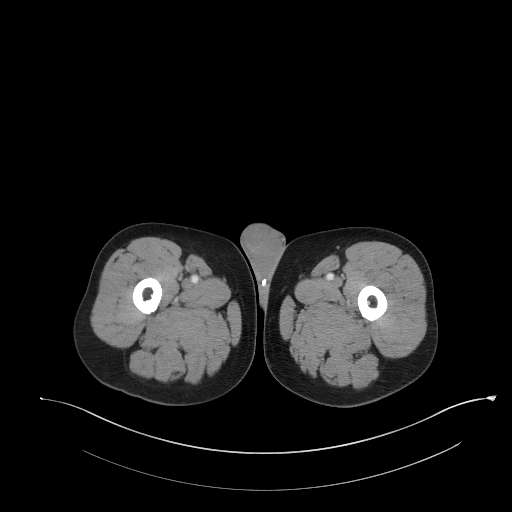
[im 6/112  bone]
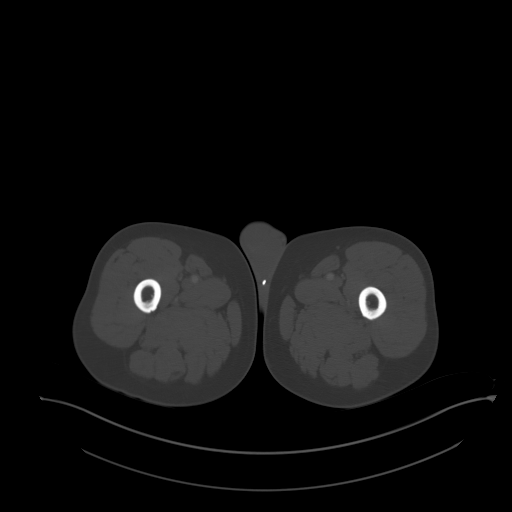
[im 16/112  soft-tissue]
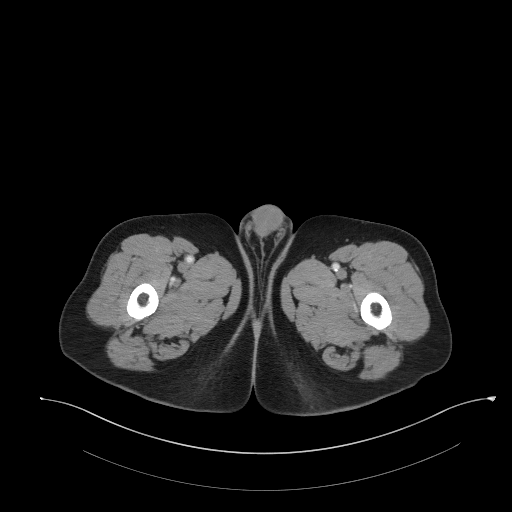
[im 22/112  soft-tissue]
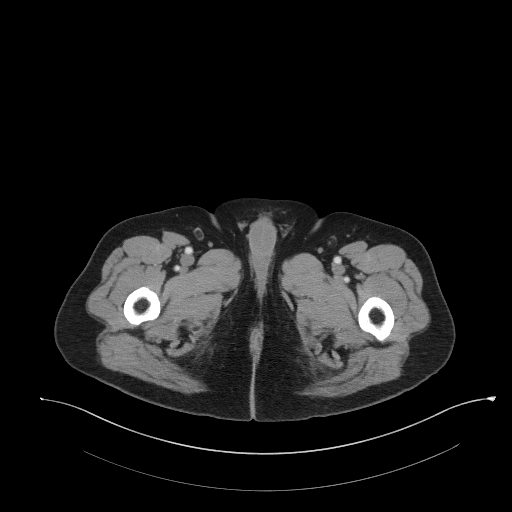
[im 32/112  soft-tissue]
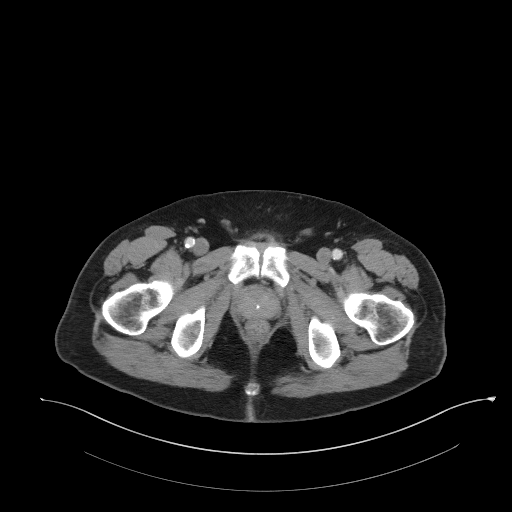
[im 38/112  soft-tissue]
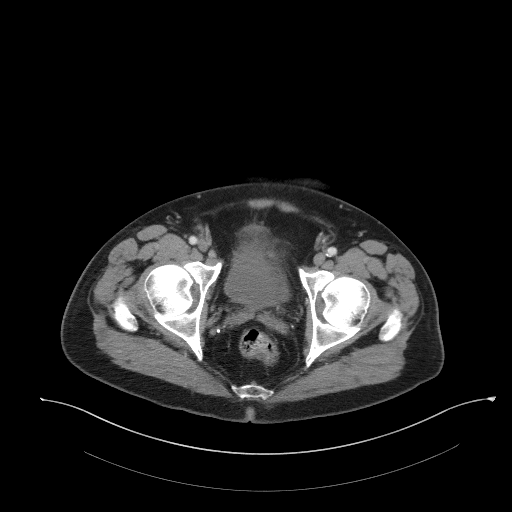
[im 48/112  soft-tissue]
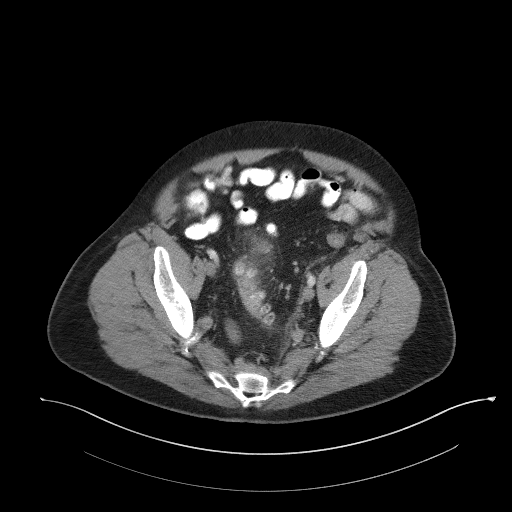
[im 59/112  soft-tissue]
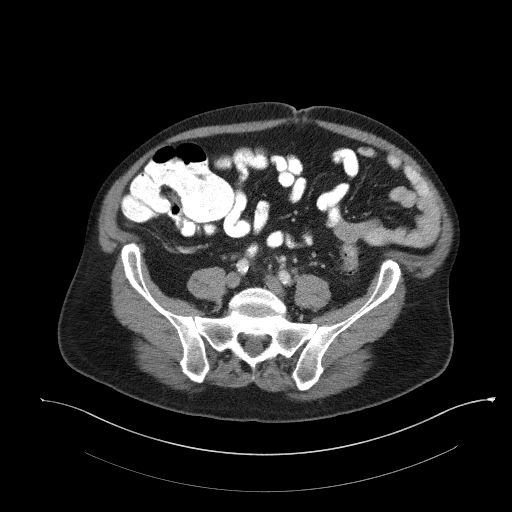
[im 64/112  soft-tissue]
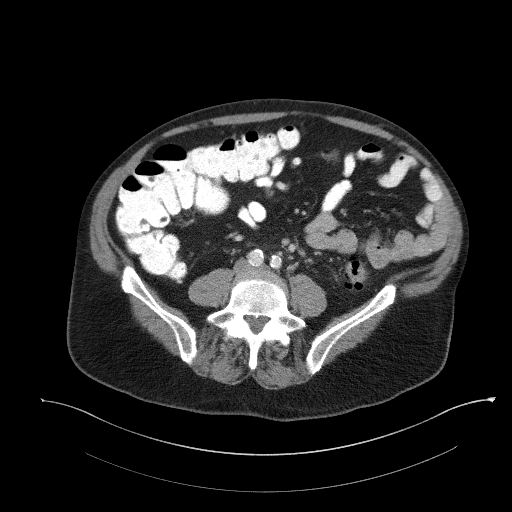
[im 75/112  soft-tissue]
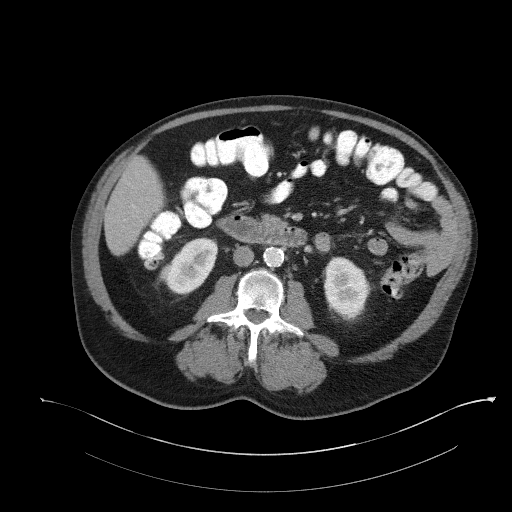
[im 75/112  bone]
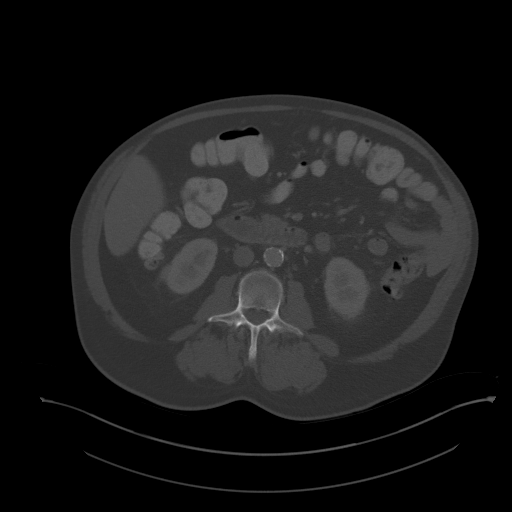
[im 80/112  soft-tissue]
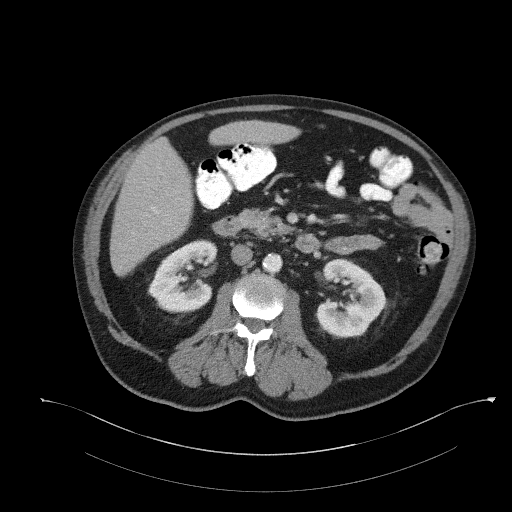
[im 90/112  soft-tissue]
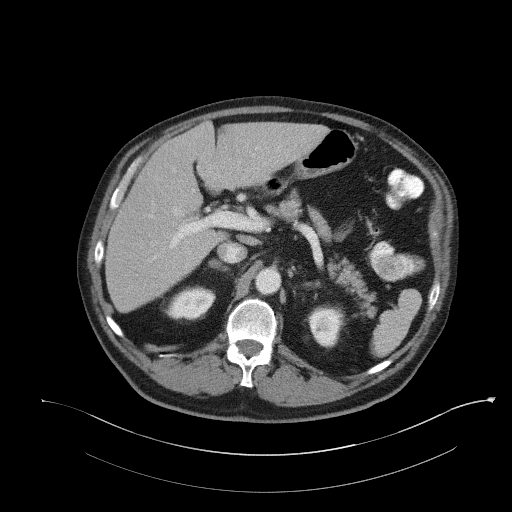
[im 96/112  soft-tissue]
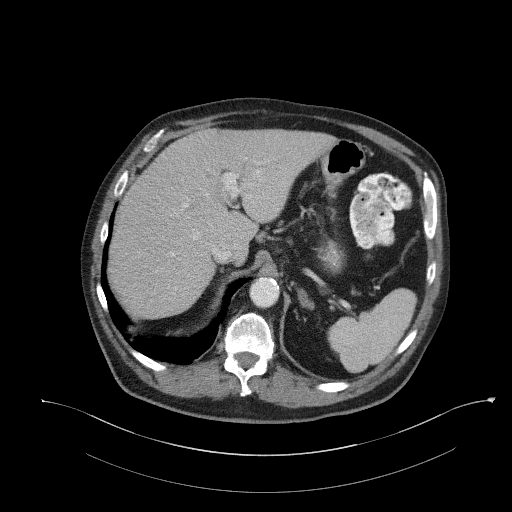
[im 106/112  soft-tissue]
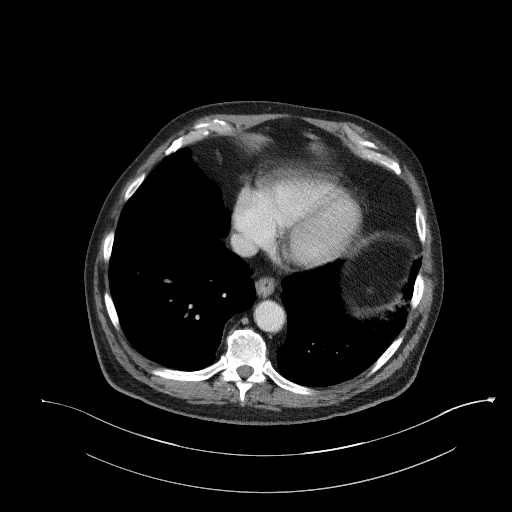

[Series 5: coronal st · coronal · 0.80mm/px · 3 of 104 slices shown]
[im 35/104  soft-tissue]
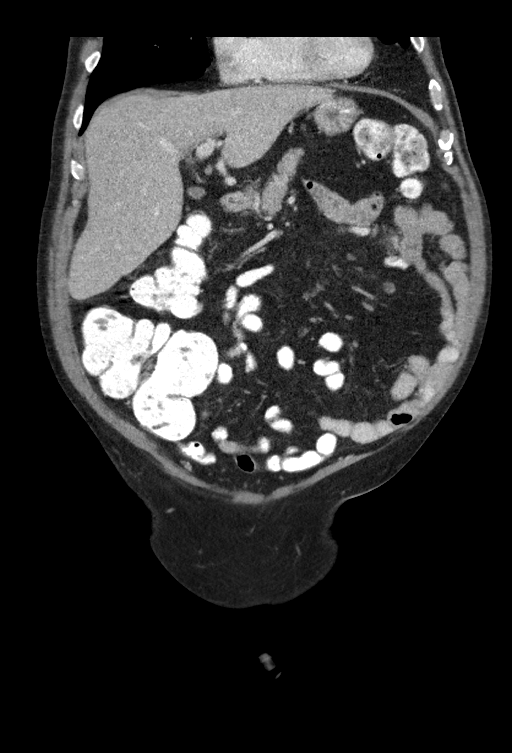
[im 46/104  soft-tissue]
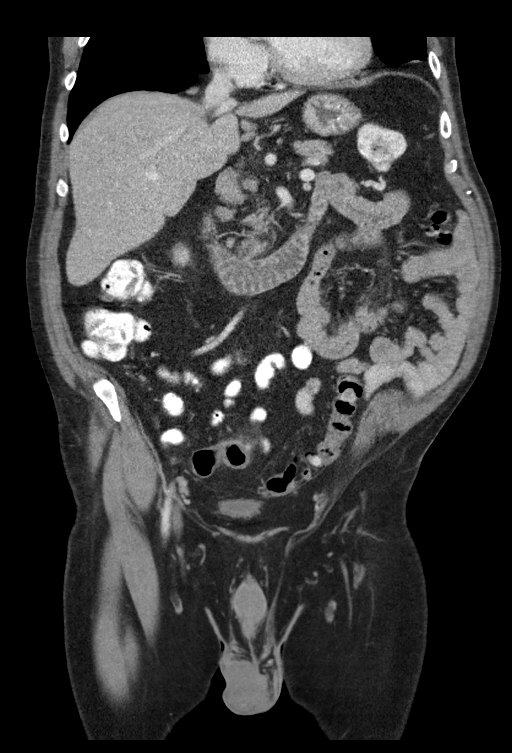
[im 58/104  soft-tissue]
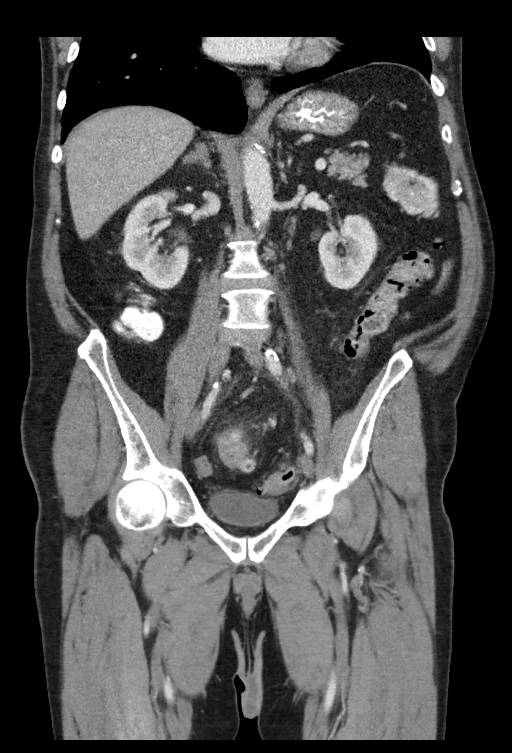

[16 of 46 positions shown; findings below may reference images not displayed]

FINDINGS: Lower chest: No acute abnormality.

Hepatobiliary: No focal liver abnormality is seen. No gallstones,
gallbladder wall thickening, or biliary dilatation.

Pancreas: Unremarkable. No pancreatic ductal dilatation or
surrounding inflammatory changes.

Spleen: Normal in size without focal abnormality.

Adrenals/Urinary Tract: Adrenal glands are diffusely nodular but
without dominant mass. No hydronephrosis. Subcentimeter hypodensity
in the right kidney too small to further characterize. Bladder
normal.

Stomach/Bowel: Stomach nonenlarged. Negative appendix. No dilated
small bowel. Wall thickening with surrounding moderate inflammation
at the sigmoid colon consistent with acute diverticulitis. No
extraluminal gas.

Vascular/Lymphatic: Moderate aortic atherosclerosis. No aneurysmal
dilatation. No significantly enlarged lymph nodes

Reproductive: Prostate is unremarkable.

Other: Negative for free air or free fluid.

Musculoskeletal: Non suspicious.
IMPRESSION: Findings consistent with acute sigmoid colon diverticulitis. No
perforation or pelvic abscess.

## 2018-12-26 ENCOUNTER — Other Ambulatory Visit: Payer: Self-pay

## 2018-12-26 DIAGNOSIS — I10 Essential (primary) hypertension: Secondary | ICD-10-CM

## 2018-12-26 MED ORDER — LABETALOL HCL 100 MG PO TABS: 150.0000 mg | ORAL_TABLET | Freq: Once | ORAL | 1 refills | Status: DC

## 2018-12-26 MED ORDER — AMLODIPINE BESYLATE 10 MG PO TABS: 10.0000 mg | ORAL_TABLET | Freq: Every day | ORAL | 1 refills | Status: DC

## 2018-12-26 MED ORDER — LOSARTAN POTASSIUM 50 MG PO TABS: 50.0000 mg | ORAL_TABLET | Freq: Every day | ORAL | 1 refills | Status: DC

## 2018-12-28 ENCOUNTER — Encounter: Payer: Self-pay | Admitting: Family

## 2018-12-30 ENCOUNTER — Telehealth: Payer: Self-pay | Admitting: Family

## 2018-12-30 DIAGNOSIS — I10 Essential (primary) hypertension: Secondary | ICD-10-CM

## 2019-01-03 ENCOUNTER — Telehealth: Payer: Self-pay | Admitting: Family

## 2019-01-03 DIAGNOSIS — I10 Essential (primary) hypertension: Secondary | ICD-10-CM

## 2019-01-03 MED ORDER — LABETALOL HCL 100 MG PO TABS: 150.0000 mg | ORAL_TABLET | Freq: Every day | ORAL | 1 refills | Status: DC

## 2019-01-03 NOTE — Telephone Encounter (Signed)
Please advise regarding labetalol sig.

## 2019-01-03 NOTE — Telephone Encounter (Signed)
[facility] needs clarification on labetalol. VVK#[id number].  Phone [phone]

## 2019-01-03 NOTE — Telephone Encounter (Signed)
New rx sent with proper instructions.

## 2019-01-03 NOTE — Telephone Encounter (Signed)
Please call pharmacy and make sure that the have received new rx/instructions. Thanks.

## 2019-01-06 ENCOUNTER — Encounter: Payer: Self-pay | Admitting: Family

## 2019-01-07 MED ORDER — CIPROFLOXACIN HCL 500 MG PO TABS: 500.0000 mg | ORAL_TABLET | Freq: Two times a day (BID) | ORAL | 0 refills | Status: AC

## 2019-01-07 MED ORDER — METRONIDAZOLE 500 MG PO TABS: 500.0000 mg | ORAL_TABLET | Freq: Three times a day (TID) | ORAL | 0 refills | Status: AC

## 2019-01-07 NOTE — Telephone Encounter (Signed)
Please see mychart message and contact pt to schedule a video visit on 4/13.

## 2019-01-09 NOTE — Telephone Encounter (Signed)
Per agent Lynerica, new instructions received.

## 2019-01-09 NOTE — Telephone Encounter (Signed)
Called patient this morning at home and cell numbers but no answer. Left voice mail for hi to call today and schedule a virtual visit with Melissa .

## 2019-01-09 NOTE — Telephone Encounter (Signed)
Called a few times at numbers listed but no answer, lvm for patient to call for appointment today.

## 2019-01-10 NOTE — Telephone Encounter (Signed)
Was able to contact patient this morning he had no power at home yesterday and it was hard for him to communicate. Patient was scheduled for virtual visit tomorrow at 8:20

## 2019-01-11 ENCOUNTER — Other Ambulatory Visit: Payer: Self-pay

## 2019-01-11 ENCOUNTER — Ambulatory Visit (INDEPENDENT_AMBULATORY_CARE_PROVIDER_SITE_OTHER): Payer: BLUE CROSS/BLUE SHIELD | Admitting: Family

## 2019-01-11 DIAGNOSIS — K5792 Diverticulitis of intestine, part unspecified, without perforation or abscess without bleeding: Secondary | ICD-10-CM | POA: Diagnosis not present

## 2019-01-11 NOTE — Progress Notes (Signed)
Virtual Visit via Video Note  I connected with Matthew Lee on 01/11/19 at  3:20 PM EDT by a video enabled telemedicine application and verified that I am speaking with the correct person using two identifiers. This visit type was conducted due to national recommendations for restrictions regarding the COVID-19 Pandemic (e.g. social distancing).  This format is felt to be most appropriate for this patient at this time.   I discussed the limitations of evaluation and management by telemedicine and the availability of in person appointments. The patient expressed understanding and agreed to proceed.  Only the patient and myself were on today's video visit. The patient was at home and I was in my office at the time of today's visit.   History of Present Illness:  The patient reached out via mychart on 01/06/19 due to c/o low grade fever, tmax 99.8,  And lower abdominal discomfort.  He felt that these symptoms were consistent with his previous diverticulitis symptoms. He was placed on empiric cipro/flagyl and presents today for follow up.  Today he reports feeling much better with resolution of fever and abdominal pain. He is half way through his cipro/flagyl treatment.    Observations/Objective:   Gen: Awake, alert, no acute distress Resp: Breathing is even and non-labored Psych: calm/pleasant demeanor Neuro: Alert and Oriented x 3, + facial symmetry, speech is clear.  Assessment and Plan: 1) diverticulitis- clinically resolving. Advised pt to complete abx and to call if symptoms worsen or if symptoms do not continue to improve.   Follow Up Instructions:    I discussed the assessment and treatment plan with the patient. The patient was provided an opportunity to ask questions and all were answered. The patient agreed with the plan and demonstrated an understanding of the instructions.   The patient was advised to call back or seek an in-person evaluation if the symptoms worsen or if the  condition fails to improve as anticipated.    Lemont Fillers, NP

## 2019-01-20 ENCOUNTER — Encounter: Payer: BLUE CROSS/BLUE SHIELD | Admitting: Family

## 2019-03-24 ENCOUNTER — Encounter: Payer: Self-pay | Admitting: Family

## 2019-03-24 DIAGNOSIS — I10 Essential (primary) hypertension: Secondary | ICD-10-CM

## 2019-03-26 MED ORDER — LOSARTAN POTASSIUM 50 MG PO TABS: 50.0000 mg | ORAL_TABLET | Freq: Every day | ORAL | 0 refills | Status: DC

## 2019-03-26 MED ORDER — AMLODIPINE BESYLATE 10 MG PO TABS: 10.0000 mg | ORAL_TABLET | Freq: Every day | ORAL | 0 refills | Status: DC

## 2019-03-28 MED ORDER — LOSARTAN POTASSIUM 50 MG PO TABS: 50.0000 mg | ORAL_TABLET | Freq: Every day | ORAL | 0 refills | Status: DC

## 2019-03-28 MED ORDER — LABETALOL HCL 100 MG PO TABS: 150.0000 mg | ORAL_TABLET | Freq: Every day | ORAL | 0 refills | Status: DC

## 2019-03-28 NOTE — Addendum Note (Signed)
Addended byDamita Dunnings D on: 03/28/2019 04:53 PM   Modules accepted: Orders

## 2019-04-02 ENCOUNTER — Encounter: Payer: Self-pay | Admitting: Family

## 2019-04-03 ENCOUNTER — Encounter: Payer: BLUE CROSS/BLUE SHIELD | Admitting: Family

## 2019-04-25 ENCOUNTER — Encounter: Payer: BC Managed Care – PPO | Admitting: Family

## 2019-04-25 DIAGNOSIS — Z0289 Encounter for other administrative examinations: Secondary | ICD-10-CM

## 2019-05-16 ENCOUNTER — Encounter: Payer: Self-pay | Admitting: Family

## 2019-05-16 DIAGNOSIS — I10 Essential (primary) hypertension: Secondary | ICD-10-CM

## 2019-05-17 MED ORDER — LABETALOL HCL 100 MG PO TABS: 150.0000 mg | ORAL_TABLET | Freq: Two times a day (BID) | ORAL | 1 refills | Status: DC

## 2019-05-17 MED ORDER — LABETALOL HCL 100 MG PO TABS: 150.0000 mg | ORAL_TABLET | Freq: Two times a day (BID) | ORAL | 0 refills | Status: DC

## 2019-05-22 MED ORDER — LABETALOL HCL 100 MG PO TABS: 150.0000 mg | ORAL_TABLET | Freq: Two times a day (BID) | ORAL | 1 refills | Status: DC

## 2019-05-22 NOTE — Addendum Note (Signed)
Addended by: Debbrah Alar on: 05/22/2019 02:33 PM   Modules accepted: Orders

## 2019-05-29 ENCOUNTER — Encounter: Payer: Self-pay | Admitting: Family

## 2019-06-24 ENCOUNTER — Other Ambulatory Visit: Payer: Self-pay | Admitting: Family

## 2019-06-24 DIAGNOSIS — I10 Essential (primary) hypertension: Secondary | ICD-10-CM

## 2019-12-26 ENCOUNTER — Other Ambulatory Visit: Payer: Self-pay | Admitting: Family

## 2019-12-26 DIAGNOSIS — I10 Essential (primary) hypertension: Secondary | ICD-10-CM

## 2020-01-29 ENCOUNTER — Encounter: Payer: Self-pay | Admitting: Family

## 2020-01-29 ENCOUNTER — Ambulatory Visit (INDEPENDENT_AMBULATORY_CARE_PROVIDER_SITE_OTHER): Payer: BC Managed Care – PPO | Admitting: Family

## 2020-01-29 ENCOUNTER — Other Ambulatory Visit: Payer: Self-pay

## 2020-01-29 VITALS — BP 134/82 | HR 71 | Temp 96.9°F | Resp 16 | Ht 67.0 in | Wt 165.0 lb

## 2020-01-29 DIAGNOSIS — Z0000 Encounter for general adult medical examination without abnormal findings: Secondary | ICD-10-CM

## 2020-01-29 DIAGNOSIS — Z23 Encounter for immunization: Secondary | ICD-10-CM | POA: Diagnosis not present

## 2020-01-29 DIAGNOSIS — Z125 Encounter for screening for malignant neoplasm of prostate: Secondary | ICD-10-CM

## 2020-01-29 DIAGNOSIS — I10 Essential (primary) hypertension: Secondary | ICD-10-CM

## 2020-01-29 LAB — COMPREHENSIVE METABOLIC PANEL
ALT: 15 U/L (ref 0–53)
AST: 13 U/L (ref 0–37)
Albumin: 4.2 g/dL (ref 3.5–5.2)
Alkaline Phosphatase: 82 U/L (ref 39–117)
BUN: 21 mg/dL (ref 6–23)
CO2: 26 mEq/L (ref 19–32)
Calcium: 9.1 mg/dL (ref 8.4–10.5)
Chloride: 103 mEq/L (ref 96–112)
Creatinine, Ser: 1.03 mg/dL (ref 0.40–1.50)
GFR: 72.35 mL/min (ref >60.00)
Glucose, Bld: 110 mg/dL — ABNORMAL HIGH (ref 70–99)
Potassium: 4.4 mEq/L (ref 3.5–5.1)
Sodium: 136 mEq/L (ref 135–145)
Total Bilirubin: 0.8 mg/dL (ref 0.2–1.2)
Total Protein: 7 g/dL (ref 6.0–8.3)

## 2020-01-29 LAB — PSA, MEDICARE: PSA: 1.86 ng/ml (ref 0.10–4.00)

## 2020-01-29 MED ORDER — SHINGRIX 50 MCG/0.5ML IM SUSR: INTRAMUSCULAR | 1 refills | Status: DC

## 2020-01-29 MED ORDER — LABETALOL HCL 100 MG PO TABS: ORAL_TABLET | ORAL | 1 refills | Status: DC

## 2020-01-29 MED ORDER — AMLODIPINE BESYLATE 10 MG PO TABS: ORAL_TABLET | ORAL | 1 refills | Status: DC

## 2020-01-29 MED ORDER — LOSARTAN POTASSIUM 50 MG PO TABS: 50.0000 mg | ORAL_TABLET | Freq: Every day | ORAL | 1 refills | Status: DC

## 2020-01-29 NOTE — Patient Instructions (Signed)
Please complete lab work prior to leaving.  Preventive Care 40-66 Years Old, Male Preventive care refers to lifestyle choices and visits with your health care provider that can promote health and wellness. This includes:  A yearly physical exam. This is also called an annual well check.  Regular dental and eye exams.  Immunizations.  Screening for certain conditions.  Healthy lifestyle choices, such as eating a healthy diet, getting regular exercise, not using drugs or products that contain nicotine and tobacco, and limiting alcohol use. What can I expect for my preventive care visit? Physical exam Your health care provider will check:  Height and weight. These may be used to calculate body mass index (BMI), which is a measurement that tells if you are at a healthy weight.  Heart rate and blood pressure.  Your skin for abnormal spots. Counseling Your health care provider may ask you questions about:  Alcohol, tobacco, and drug use.  Emotional well-being.  Home and relationship well-being.  Sexual activity.  Eating habits.  Work and work environment. What immunizations do I need?  Influenza (flu) vaccine  This is recommended every year. Tetanus, diphtheria, and pertussis (Tdap) vaccine  You may need a Td booster every 10 years. Varicella (chickenpox) vaccine  You may need this vaccine if you have not already been vaccinated. Zoster (shingles) vaccine  You may need this after age 60. Measles, mumps, and rubella (MMR) vaccine  You may need at least one dose of MMR if you were born in 1957 or later. You may also need a second dose. Pneumococcal conjugate (PCV13) vaccine  You may need this if you have certain conditions and were not previously vaccinated. Pneumococcal polysaccharide (PPSV23) vaccine  You may need one or two doses if you smoke cigarettes or if you have certain conditions. Meningococcal conjugate (MenACWY) vaccine  You may need this if you have  certain conditions. Hepatitis A vaccine  You may need this if you have certain conditions or if you travel or work in places where you may be exposed to hepatitis A. Hepatitis B vaccine  You may need this if you have certain conditions or if you travel or work in places where you may be exposed to hepatitis B. Haemophilus influenzae type b (Hib) vaccine  You may need this if you have certain risk factors. Human papillomavirus (HPV) vaccine  If recommended by your health care provider, you may need three doses over 6 months. You may receive vaccines as individual doses or as more than one vaccine together in one shot (combination vaccines). Talk with your health care provider about the risks and benefits of combination vaccines. What tests do I need? Blood tests  Lipid and cholesterol levels. These may be checked every 5 years, or more frequently if you are over 50 years old.  Hepatitis C test.  Hepatitis B test. Screening  Lung cancer screening. You may have this screening every year starting at age 55 if you have a 30-pack-year history of smoking and currently smoke or have quit within the past 15 years.  Prostate cancer screening. Recommendations will vary depending on your family history and other risks.  Colorectal cancer screening. All adults should have this screening starting at age 50 and continuing until age 75. Your health care provider may recommend screening at age 45 if you are at increased risk. You will have tests every 1-10 years, depending on your results and the type of screening test.  Diabetes screening. This is done by checking your   blood sugar (glucose) after you have not eaten for a while (fasting). You may have this done every 1-3 years.  Sexually transmitted disease (STD) testing. Follow these instructions at home: Eating and drinking  Eat a diet that includes fresh fruits and vegetables, whole grains, lean protein, and low-fat dairy products.  Take  vitamin and mineral supplements as recommended by your health care provider.  Do not drink alcohol if your health care provider tells you not to drink.  If you drink alcohol: ? Limit how much you have to 0-2 drinks a day. ? Be aware of how much alcohol is in your drink. In the [place] one drink equals one 12 oz bottle of beer (355 mL), one 5 oz glass of wine (148 mL), or one 1 oz glass of hard liquor (44 mL). Lifestyle  Take daily care of your teeth and gums.  Stay active. Exercise for at least 30 minutes on 5 or more days each week.  Do not use any products that contain nicotine or tobacco, such as cigarettes, e-cigarettes, and chewing tobacco. If you need help quitting, ask your health care provider.  If you are sexually active, practice safe sex. Use a condom or other form of protection to prevent STIs (sexually transmitted infections).  Talk with your health care provider about taking a low-dose aspirin every day starting at age 50. What's next?  Go to your health care provider once a year for a well check visit.  Ask your health care provider how often you should have your eyes and teeth checked.  Stay up to date on all vaccines. This information is not intended to replace advice given to you by your health care provider. Make sure you discuss any questions you have with your health care provider. Document Revised: 09/08/2018 Document Reviewed: 09/08/2018 Elsevier Patient Education  2020 Elsevier Inc.  

## 2020-01-29 NOTE — Addendum Note (Signed)
Addended by: Wilford Corner on: 01/29/2020 10:33 AM   Modules accepted: Orders

## 2020-01-29 NOTE — Progress Notes (Signed)
Subjective:    Patient ID: Matthew Lee, male    DOB: 05-09-1956, 64 y.o.   MRN: [id number]  HPI  Patient presents today for complete physical.  Immunizations: tdap 2014, pneumovax 23 today, due for shingrix Diet: improving, trying to do better Wt Readings from Last 3 Encounters:  01/29/20 165 lb (74.8 kg)  12/12/18 191 lb 12.8 oz (87 kg)  03/02/18 183 lb (83 kg)  Exercise:  Not enough- walks Colonoscopy: due (did not schedule last year) PSA:  Lab Results  Component Value Date   PSA 1.55 12/31/2014    HTN- maintained on amlodipine, labetalol and losartan.  BP Readings from Last 3 Encounters:  01/29/20 134/82  12/12/18 128/78  01/21/18 (!) 158/98      Review of Systems  Constitutional: Negative for unexpected weight change.  HENT: Negative for rhinorrhea.   Respiratory: Negative for cough and shortness of breath.   Cardiovascular: Negative for chest pain.  Gastrointestinal: Negative for blood in stool, constipation and diarrhea.  Genitourinary: Negative for difficulty urinating, dysuria, frequency and hematuria.  Musculoskeletal: Negative for arthralgias and myalgias.  Skin: Negative for [place].  Neurological: Negative for headaches.  Hematological: Negative for adenopathy.  Psychiatric/Behavioral:       Denies depression/anxiety     Past Medical History:  Diagnosis Date  . Diverticulitis   . Headache(784.0)   . Hepatitis C 08/24/2012   TX and cured per pt  . History of tobacco abuse   . Hypertension    long standing since 2007, was treated for sometime, but then discontinued taking meds on his own.  . Pulmonary edema, acute ([place])    hospitalized in 09/2009 with hypertensive crisis and acute pulmonary edema- improved with lasix nad labetalol.     Social History   Socioeconomic History  . Marital status: Married    Spouse name: Not on file  . Number of children: Not on file  . Years of education: Not on file  . Highest education level: Not on file    Occupational History  . 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  . Occupation: ENGINEER    Employer: [employer]  Tobacco Use  . Smoking status: Former Smoker    Packs/day: 1.00    Years: 40.00    Pack years: 40.00    Types: Cigarettes    Quit date: 09/23/2009    Years since quitting: 10.3  . Smokeless tobacco: Never Used  Substance and Sexual Activity  . Alcohol use: Yes    Comment: hx of heavy ETOH abuse-1 glass wine monthly  . Drug use: No  . Sexual activity: Not on file  Other Topics Concern  . Not on file  Social History Narrative   Lives at home with wife, daughter and her 2 children.   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         Social Determinants of Health   Financial Resource Strain:   . Difficulty of Paying Living Expenses:   Food Insecurity:   . Worried About Charity fundraiser in the Last Year:   . Arboriculturist in the Last Year:   Transportation Needs:   . Film/video editor (Medical):   Marland Kitchen Lack of Transportation (Non-Medical):   Physical Activity:   . Days of Exercise per Week:   . Minutes of Exercise per Session:   Stress:   . Feeling of  Stress :   Social Connections:   . Frequency of Communication with Friends and Family:   . Frequency of Social Gatherings with Friends and Family:   . Attends Religious Services:   . Active Member of Clubs or Organizations:   . Attends Banker Meetings:   Marland Kitchen Marital Status:   Intimate Partner Violence:   . Fear of Current or Ex-Partner:   . Emotionally Abused:   Marland Kitchen Physically Abused:   . Sexually Abused:     Past Surgical History:  Procedure Laterality Date  . ESOPHAGOGASTRODUODENOSCOPY  08/21/2011   Procedure: ESOPHAGOGASTRODUODENOSCOPY (EGD);  Surgeon: Charna Elizabeth, MD;  Location: [facility];  Service: Endoscopy;  Laterality: N/A;  . TONSILLECTOMY      Family History  Problem  Relation Age of Onset  . Polycystic kidney disease Father   . Colon cancer Neg Hx   . Esophageal cancer Neg Hx   . Stomach cancer Neg Hx     No Known Allergies  No current outpatient medications on file prior to visit.   No current facility-administered medications on file prior to visit.    BP 134/82   Pulse 71   Temp (!) 96.9 F (36.1 C) (Temporal)   Resp 16   Ht 5\' 7"  (1.702 m)   Wt 165 lb (74.8 kg)   SpO2 98%   BMI 25.84 kg/m         Objective:   Physical Exam  Physical Exam  Constitutional: He is oriented to person, place, and time. He appears well-developed and well-nourished. No distress.  HENT:  Head: Normocephalic and atraumatic.  Right Ear: Tympanic membrane and ear canal normal.  Left Ear: Tympanic membrane and ear canal normal.  Mouth/Throat: Not examined- pt wearing mask Eyes: Pupils are equal, round, and reactive to light. No scleral icterus.  Neck: Normal range of motion. No thyromegaly present.  Cardiovascular: Normal rate and regular rhythm.   No murmur heard. Pulmonary/Chest: Effort normal and breath sounds normal. No respiratory distress. He has no wheezes. He has no rales. He exhibits no tenderness.  Abdominal: Soft. Bowel sounds are normal. He exhibits no distension and no mass. There is no tenderness. There is no rebound and no guarding.  Musculoskeletal: He exhibits no edema.  Lymphadenopathy:    He has no cervical adenopathy.  Neurological: He is alert and oriented to person, place, and time. He has normal patellar reflexes. He exhibits normal muscle tone. Coordination normal.  Skin: Skin is warm and dry.  Psychiatric: He has a normal mood and affect. His behavior is normal. Judgment and thought content normal.           Assessment & Plan:  Preventative care- encouraged pt to continue healthy diet, regular exercise and weight loss. Refer for colonoscopy, Pneumovax 23 today.  Obtain labs as ordered.  HTN- bp stable, continue current  meds.   This visit occurred during the SARS-CoV-2 public health emergency.  Safety protocols were in place, including screening questions prior to the visit, additional usage of staff PPE, and extensive cleaning of exam room while observing appropriate contact time as indicated for disinfecting solutions.          Assessment & Plan:

## 2020-01-30 ENCOUNTER — Other Ambulatory Visit: Payer: Self-pay

## 2020-01-30 DIAGNOSIS — I10 Essential (primary) hypertension: Secondary | ICD-10-CM

## 2020-03-15 ENCOUNTER — Encounter: Payer: Self-pay | Admitting: Family

## 2020-03-15 ENCOUNTER — Ambulatory Visit (INDEPENDENT_AMBULATORY_CARE_PROVIDER_SITE_OTHER): Payer: BC Managed Care – PPO | Admitting: Family

## 2020-03-15 ENCOUNTER — Other Ambulatory Visit: Payer: Self-pay

## 2020-03-15 VITALS — BP 132/77 | HR 78 | Temp 97.3°F | Resp 18 | Ht 67.0 in | Wt 165.0 lb

## 2020-03-15 DIAGNOSIS — R300 Dysuria: Secondary | ICD-10-CM

## 2020-03-15 DIAGNOSIS — N3000 Acute cystitis without hematuria: Secondary | ICD-10-CM | POA: Diagnosis not present

## 2020-03-15 DIAGNOSIS — Z23 Encounter for immunization: Secondary | ICD-10-CM

## 2020-03-15 LAB — POC URINALSYSI DIPSTICK (AUTOMATED)
Bilirubin, UA: NEGATIVE
Glucose, UA: NEGATIVE
Ketones, UA: NEGATIVE
Nitrite, UA: POSITIVE
Protein, UA: POSITIVE — AB
Spec Grav, UA: 1.015 (ref 1.010–1.025)
Urobilinogen, UA: NEGATIVE E.U./dL — AB
pH, UA: 6 (ref 5.0–8.0)

## 2020-03-15 MED ORDER — CIPROFLOXACIN HCL 500 MG PO TABS: 500.0000 mg | ORAL_TABLET | Freq: Two times a day (BID) | ORAL | 0 refills | Status: DC

## 2020-03-15 NOTE — Addendum Note (Signed)
Addended by: Wilford Corner on: 03/15/2020 04:44 PM   Modules accepted: Orders

## 2020-03-15 NOTE — Progress Notes (Signed)
Subjective:    Patient ID: Matthew Lee, male    DOB: 1956/05/25, 64 y.o.   MRN: [id number]  HPI  Patient is a 64 yr old male who presents today with chief complaint of dysuria. Denies urinary frequency. Symptoms began yesterday. Reports he has felt feverish but no documented temp. He denies hematuria.  Denies low back pain.      Review of Systems See HPI  Past Medical History:  Diagnosis Date   Diverticulitis    Headache(784.0)    Hepatitis C 08/24/2012   TX and cured per pt   History of tobacco abuse    Hypertension    long standing since 2007, was treated for sometime, but then discontinued taking meds on his own.   Pulmonary edema, acute ([place])    hospitalized in 09/2009 with hypertensive crisis and acute pulmonary edema- improved with lasix nad labetalol.     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   Occupation: Lobbyist: [employer]  Tobacco Use   Smoking status: Former Smoker    Packs/day: 1.00    Years: 40.00    Pack years: 40.00    Types: Cigarettes    Quit date: 09/23/2009    Years since quitting: 10.4   Smokeless tobacco: Never Used  Vaping Use   Vaping Use: Never used  Substance and Sexual Activity   Alcohol use: Yes    Comment: hx of heavy ETOH abuse-1 glass wine monthly   Drug use: No   Sexual activity: Not on file  Other Topics Concern   Not on file  Social History Narrative   Lives at home with wife, daughter and her 2 children.   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         Social Determinants of Health   Financial Resource Strain:    Difficulty of Paying Living Expenses:     Food Insecurity:    Worried About Charity fundraiser in the Last Year:    Arboriculturist in the Last Year:   Transportation Needs:    Film/video editor (Medical):    Lack of Transportation (Non-Medical):   Physical Activity:    Days of Exercise per Week:    Minutes of Exercise per Session:   Stress:    Feeling of Stress :   Social Connections:    Frequency of Communication with Friends and Family:    Frequency of Social Gatherings with Friends and Family:    Attends Religious Services:    Active Member of Clubs or Organizations:    Attends Archivist Meetings:    Marital Status:   Intimate Partner Violence:    Fear of Current or Ex-Partner:    Emotionally Abused:    Physically Abused:    Sexually Abused:     Past Surgical History:  Procedure Laterality Date   ESOPHAGOGASTRODUODENOSCOPY  08/21/2011   Procedure: ESOPHAGOGASTRODUODENOSCOPY (EGD);  Surgeon: Juanita Craver, MD;  Location: [facility];  Service: Endoscopy;  Laterality: N/A;   TONSILLECTOMY      Family History  Problem Relation Age of Onset  Polycystic kidney disease Father    Colon cancer Neg Hx    Esophageal cancer Neg Hx    Stomach cancer Neg Hx     No Known Allergies  Current Outpatient Medications on File Prior to Visit  Medication Sig Dispense Refill   amLODipine (NORVASC) 10 MG tablet TAKE 1 TABLET DAILY 90 tablet 1   labetalol (NORMODYNE) 100 MG tablet TAKE ONE AND ONE-HALF TABLETS TWICE A DAY 135 tablet 1   losartan (COZAAR) 50 MG tablet Take 1 tablet (50 mg total) by mouth daily. 90 tablet 1   Zoster Vaccine Adjuvanted [facility]) injection Inject 0.5mg  IM now and again in 2-6 months. 0.5 mL 1   No current facility-administered medications on file prior to visit.    BP 132/77 (BP Location: Right Arm, Patient Position: Sitting, Cuff Size: Small)    Pulse 78    Temp (!) 97.3 F (36.3 C) (Temporal)    Resp 18    Ht 5\' 7"  (1.702 m)    Wt 165 lb (74.8 kg)     SpO2 98%    BMI 25.84 kg/m       Objective:   Physical Exam Constitutional:      General: He is not in acute distress.    Appearance: He is well-developed.  HENT:     Head: Normocephalic and atraumatic.  Cardiovascular:     Rate and Rhythm: Normal rate and regular rhythm.     Heart sounds: No murmur heard.   Pulmonary:     Effort: Pulmonary effort is normal. No respiratory distress.     Breath sounds: Normal breath sounds. No wheezing or rales.  Abdominal:     Tenderness: There is no abdominal tenderness.  Skin:    General: Skin is warm and dry.  Neurological:     Mental Status: He is alert and oriented to person, place, and time.  Psychiatric:        Behavior: Behavior normal.        Thought Content: Thought content normal.           Assessment & Plan:  UTI- urinalysis suggestive of UTI with large leuks. Will initiate cipro and send urine for culture. Pt is advised to call if symptoms worsen or if symptoms fail to improve.  Patient was advised to quarantine as follows following positive COVID-19 result:  At least 10 days have passed since symptom onset and At least 24 hours have passed since resolution of fever without the use of fever-reducing medications and Other symptoms have improved.

## 2020-03-15 NOTE — Patient Instructions (Signed)
Please start cipro for your urinary tract infection. Call if symptoms worsen or if symptoms do not improve.

## 2020-03-17 LAB — URINE CULTURE
MICRO NUMBER:: 10608187
SPECIMEN QUALITY:: ADEQUATE

## 2020-03-18 ENCOUNTER — Encounter: Payer: Self-pay | Admitting: Family

## 2020-03-18 MED ORDER — CIPROFLOXACIN HCL 500 MG PO TABS: 500.0000 mg | ORAL_TABLET | Freq: Two times a day (BID) | ORAL | 0 refills | Status: DC

## 2020-04-08 ENCOUNTER — Encounter: Payer: Self-pay | Admitting: Family

## 2020-05-05 ENCOUNTER — Encounter: Payer: Self-pay | Admitting: Family

## 2020-05-05 DIAGNOSIS — I10 Essential (primary) hypertension: Secondary | ICD-10-CM

## 2020-05-06 MED ORDER — LABETALOL HCL 100 MG PO TABS: ORAL_TABLET | ORAL | 1 refills | Status: DC

## 2020-05-14 ENCOUNTER — Ambulatory Visit (INDEPENDENT_AMBULATORY_CARE_PROVIDER_SITE_OTHER): Payer: BC Managed Care – PPO | Admitting: Family

## 2020-05-14 ENCOUNTER — Encounter: Payer: Self-pay | Admitting: Family

## 2020-05-14 ENCOUNTER — Other Ambulatory Visit: Payer: Self-pay

## 2020-05-14 VITALS — BP 124/70 | HR 72 | Resp 16 | Ht 67.0 in | Wt 165.0 lb

## 2020-05-14 DIAGNOSIS — I10 Essential (primary) hypertension: Secondary | ICD-10-CM

## 2020-05-14 DIAGNOSIS — K219 Gastro-esophageal reflux disease without esophagitis: Secondary | ICD-10-CM

## 2020-05-14 MED ORDER — AMLODIPINE BESYLATE 10 MG PO TABS: ORAL_TABLET | ORAL | 1 refills | Status: DC

## 2020-05-14 MED ORDER — LOSARTAN POTASSIUM 50 MG PO TABS: 50.0000 mg | ORAL_TABLET | Freq: Every day | ORAL | 1 refills | Status: DC

## 2020-05-14 MED ORDER — LABETALOL HCL 100 MG PO TABS: ORAL_TABLET | ORAL | 1 refills | Status: DC

## 2020-05-14 NOTE — Progress Notes (Signed)
Subjective:    Patient ID: Matthew Lee, male    DOB: 08-14-1956, 64 y.o.   MRN: [id number]  HPI  Patient is a 64 yr old male who presents today for follow up.  HTN- maintained on losartan, labetalol, amlodipine. Feeling well on current medications. He is pleased with his weight loss and other healthy choices he has made (quitting alcohol and tobacco).  BP Readings from Last 3 Encounters:  05/14/20 124/70  03/15/20 132/77  01/29/20 134/82   GERD- not currently on PPI. Reports very infrequent symptoms. Had more symptoms when he was heavier.   Review of Systems See HPI  Past Medical History:  Diagnosis Date  . Diverticulitis   . Headache(784.0)   . Hepatitis C 08/24/2012   TX and cured per pt  . History of tobacco abuse   . Hypertension    long standing since 2007, was treated for sometime, but then discontinued taking meds on his own.  . Pulmonary edema, acute ([facility])    hospitalized in 09/2009 with hypertensive crisis and acute pulmonary edema- improved with lasix nad labetalol.     Social History   Socioeconomic History  . Marital status: Married    Spouse name: Not on file  . Number of children: Not on file  . Years of education: Not on file  . Highest education level: Not on file  Occupational History  . 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  . Occupation: ENGINEER    Employer: [employer]  Tobacco Use  . Smoking status: Former Smoker    Packs/day: 1.00    Years: 40.00    Pack years: 40.00    Types: Cigarettes    Quit date: 09/23/2009    Years since quitting: 10.6  . Smokeless tobacco: Never Used  Vaping Use  . Vaping Use: Never used  Substance and Sexual Activity  . Alcohol use: Yes    Comment: hx of heavy ETOH abuse-1 glass wine monthly  . Drug use: No  . Sexual activity: Not on file  Other Topics Concern  . Not on file  Social History Narrative   Lives at home with wife, daughter and her 2 children.   Caffeine use:   1 daily   Works a Aeronautical engineer (Museum/gallery conservator)   Enjoys Optician, dispensing, Civil engineer, contracting.         Social Determinants of Health   Financial Resource Strain:   . Difficulty of Paying Living Expenses:   Food Insecurity:   . Worried About Programme researcher, broadcasting/film/video in the Last Year:   . Barista in the Last Year:   Transportation Needs:   . Freight forwarder (Medical):   Marland Kitchen Lack of Transportation (Non-Medical):   Physical Activity:   . Days of Exercise per Week:   . Minutes of Exercise per Session:   Stress:   . Feeling of Stress :   Social Connections:   . Frequency of Communication with Friends and Family:   . Frequency of Social Gatherings with Friends and Family:   . Attends Religious Services:   . Active Member of Clubs or Organizations:   . Attends Banker Meetings:   Marland Kitchen Marital Status:   Intimate Partner Violence:   . Fear of Current or Ex-Partner:   . Emotionally Abused:   Marland Kitchen Physically Abused:   . Sexually Abused:     Past Surgical History:  Procedure Laterality Date  . ESOPHAGOGASTRODUODENOSCOPY  08/21/2011  Procedure: ESOPHAGOGASTRODUODENOSCOPY (EGD);  Surgeon: Charna Elizabeth, MD;  Location: [facility];  Service: Endoscopy;  Laterality: N/A;  . TONSILLECTOMY      Family History  Problem Relation Age of Onset  . Polycystic kidney disease Father   . Colon cancer Neg Hx   . Esophageal cancer Neg Hx   . Stomach cancer Neg Hx     No Known Allergies  No current outpatient medications on file prior to visit.   No current facility-administered medications on file prior to visit.    BP 124/70 (BP Location: Right Arm, Patient Position: Sitting, Cuff Size: Normal)   Pulse 72   Resp 16   Ht 5\' 7"  (1.702 m)   Wt 165 lb (74.8 kg)   SpO2 97%   BMI 25.84 kg/m       Objective:   Physical Exam Constitutional:      General: He is not in acute distress.    Appearance: He is well-developed.  HENT:     Head: Normocephalic and  atraumatic.  Cardiovascular:     Rate and Rhythm: Normal rate and regular rhythm.     Heart sounds: No murmur heard.   Pulmonary:     Effort: Pulmonary effort is normal. No respiratory distress.     Breath sounds: Normal breath sounds. No wheezing or rales.  Skin:    General: Skin is warm and dry.  Neurological:     Mental Status: He is alert and oriented to person, place, and time.  Psychiatric:        Behavior: Behavior normal.        Thought Content: Thought content normal.           Assessment & Plan:  HTN- bp stable. Continue current meds. Commended pt on his healthy lifestyle choices.   GERD- stable without PPI. Monitor.   Reminded pt to schedule his colonoscopy.  This visit occurred during the SARS-CoV-2 public health emergency.  Safety protocols were in place, including screening questions prior to the visit, additional usage of staff PPE, and extensive cleaning of exam room while observing appropriate contact time as indicated for disinfecting solutions.

## 2020-05-14 NOTE — Patient Instructions (Signed)
Please call GI to schedule a colonoscopy.

## 2020-05-15 ENCOUNTER — Ambulatory Visit (INDEPENDENT_AMBULATORY_CARE_PROVIDER_SITE_OTHER): Payer: BC Managed Care – PPO

## 2020-05-15 DIAGNOSIS — Z23 Encounter for immunization: Secondary | ICD-10-CM

## 2020-05-15 NOTE — Progress Notes (Signed)
Matthew Lee is a 64 y.o. male presents to the office today for Shingrix #2  injection, per physician's orders. Shingrix vaccine administered IM in left deltoid.  Patient tolerated injection.  Alysia Penna

## 2020-09-03 ENCOUNTER — Encounter: Payer: Self-pay | Admitting: Family

## 2020-09-03 ENCOUNTER — Other Ambulatory Visit: Payer: Self-pay | Admitting: Family

## 2020-09-03 DIAGNOSIS — I10 Essential (primary) hypertension: Secondary | ICD-10-CM

## 2020-09-04 MED ORDER — LABETALOL HCL 100 MG PO TABS: 150.0000 mg | ORAL_TABLET | Freq: Two times a day (BID) | ORAL | 1 refills | Status: DC

## 2020-09-04 NOTE — Telephone Encounter (Signed)
This rx was filled already 09-04-20

## 2020-10-31 ENCOUNTER — Encounter: Payer: Self-pay | Admitting: Family

## 2020-11-11 ENCOUNTER — Other Ambulatory Visit: Payer: Self-pay | Admitting: Family

## 2020-11-11 DIAGNOSIS — I10 Essential (primary) hypertension: Secondary | ICD-10-CM

## 2020-11-15 ENCOUNTER — Ambulatory Visit (INDEPENDENT_AMBULATORY_CARE_PROVIDER_SITE_OTHER): Payer: Medicare Other | Admitting: Family

## 2020-11-15 ENCOUNTER — Other Ambulatory Visit: Payer: Self-pay

## 2020-11-15 VITALS — BP 108/72 | HR 69 | Temp 98.6°F | Resp 16 | Ht 67.0 in | Wt 159.0 lb

## 2020-11-15 DIAGNOSIS — I10 Essential (primary) hypertension: Secondary | ICD-10-CM

## 2020-11-15 LAB — BASIC METABOLIC PANEL
BUN: 19 mg/dL (ref 6–23)
CO2: 30 mEq/L (ref 19–32)
Calcium: 9.3 mg/dL (ref 8.4–10.5)
Chloride: 103 mEq/L (ref 96–112)
Creatinine, Ser: 1.12 mg/dL (ref 0.40–1.50)
GFR: 68.53 mL/min (ref >60.00)
Glucose, Bld: 92 mg/dL (ref 70–99)
Potassium: 4.6 mEq/L (ref 3.5–5.1)
Sodium: 137 mEq/L (ref 135–145)

## 2020-11-15 NOTE — Progress Notes (Signed)
Subjective:    Patient ID: Matthew Lee, male    DOB: 02-Dec-1955, 65 y.o.   MRN: [id number]  HPI  Patient is a 65 yr old male who presents today for follow up.   HTN- Patient is maintained on labetalol 150mg  bid, amlodipine 10mg  once daily and losartan 50mg  once daily.   BP Readings from Last 3 Encounters:  11/15/20 108/72  05/14/20 124/70  03/15/20 132/77    Review of Systems See HPI  Past Medical History:  Diagnosis Date  . Diverticulitis   . Headache(784.0)   . Hepatitis C 08/24/2012   TX and cured per pt  . History of tobacco abuse   . Hypertension    long standing since 2007, was treated for sometime, but then discontinued taking meds on his own.  . Pulmonary edema, acute (HCC)    hospitalized in 09/2009 with hypertensive crisis and acute pulmonary edema- improved with lasix nad labetalol.     Social History   Socioeconomic History  . Marital status: Married    Spouse name: Not on file  . Number of children: Not on file  . Years of education: Not on file  . Highest education level: Not on file  Occupational History  . Occupation: Works in 08/26/2012- for last 25 years.  . Occupation: ENGINEER    Employer: [employer]  Tobacco Use  . Smoking status: Former Smoker    Packs/day: 1.00    Years: 40.00    Pack years: 40.00    Types: Cigarettes    Quit date: 09/23/2009    Years since quitting: 11.1  . Smokeless tobacco: Never Used  Vaping Use  . Vaping Use: Never used  Substance and Sexual Activity  . Alcohol use: Yes    Comment: hx of heavy ETOH abuse-1 glass wine monthly  . Drug use: No  . Sexual activity: Not on file  Other Topics Concern  . Not on file  Social History Narrative   Lives at home with wife, daughter and her 2 children.   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         Social Determinants of Health   Financial Resource Strain: Not on file   Food Insecurity: Not on file  Transportation Needs: Not on file  Physical Activity: Not on file  Stress: Not on file  Social Connections: Not on file  Intimate Partner Violence: Not on file    Past Surgical History:  Procedure Laterality Date  . ESOPHAGOGASTRODUODENOSCOPY  08/21/2011   Procedure: ESOPHAGOGASTRODUODENOSCOPY (EGD);  Surgeon: Optician, dispensing, MD;  Location: [facility];  Service: Endoscopy;  Laterality: N/A;  . TONSILLECTOMY      Family History  Problem Relation Age of Onset  . Polycystic kidney disease Father   . Colon cancer Neg Hx   . Esophageal cancer Neg Hx   . Stomach cancer Neg Hx     No Known Allergies  Current Outpatient Medications on File Prior to Visit  Medication Sig Dispense Refill  . amLODipine (NORVASC) 10 MG tablet TAKE 1 TABLET DAILY 90 tablet 1  . labetalol (NORMODYNE) 100 MG tablet Take 1.5 tablets (150 mg total) by mouth 2 (two) times daily. 270 tablet 1  . losartan (COZAAR) 50 MG tablet TAKE 1 TABLET DAILY 90 tablet 1   No current facility-administered medications on file prior to visit.    BP 108/72 (BP Location: Right Arm, Patient Position:  Sitting, Cuff Size: Small)   Pulse 69   Temp 98.6 F (37 C) (Oral)   Resp 16   Ht 5\' 7"  (1.702 m)   Wt 159 lb (72.1 kg)   SpO2 98%   BMI 24.90 kg/m       Objective:   Physical Exam Constitutional:      General: He is not in acute distress.    Appearance: He is well-developed and well-nourished.  HENT:     Head: Normocephalic and atraumatic.  Cardiovascular:     Rate and Rhythm: Normal rate and regular rhythm.     Heart sounds: No murmur heard.   Pulmonary:     Effort: Pulmonary effort is normal. No respiratory distress.     Breath sounds: Normal breath sounds. No wheezing or rales.  Musculoskeletal:        General: No edema.  Skin:    General: Skin is warm and dry.  Neurological:     Mental Status: He is alert and oriented to person, place, and time.  Psychiatric:        Mood  and Affect: Mood and affect normal.        Behavior: Behavior normal.        Thought Content: Thought content normal.           Assessment & Plan:  HTN- bp is well controlled on current regimen. Continue labetalol 150mg  bid, amlodipine 10mg  once daily and losartan 50mg  once daily. Obtain follow up bmet.  Pt has not scheduled colonoscopy.  He declines my offer to replace order. Discussed importance of colon cancer screening to screen for colon cancer.  Pt verbalizes understanding but states that "I will have to get back to you on that."    This visit occurred during the SARS-CoV-2 public health emergency.  Safety protocols were in place, including screening questions prior to the visit, additional usage of staff PPE, and extensive cleaning of exam room while observing appropriate contact time as indicated for disinfecting solutions.

## 2021-04-05 ENCOUNTER — Other Ambulatory Visit: Payer: Self-pay | Admitting: Family

## 2021-04-05 DIAGNOSIS — I10 Essential (primary) hypertension: Secondary | ICD-10-CM

## 2021-04-09 ENCOUNTER — Other Ambulatory Visit: Payer: Self-pay

## 2021-04-09 ENCOUNTER — Encounter: Payer: Self-pay | Admitting: Family

## 2021-04-09 ENCOUNTER — Ambulatory Visit (INDEPENDENT_AMBULATORY_CARE_PROVIDER_SITE_OTHER): Payer: Medicare Other | Admitting: Family

## 2021-04-09 VITALS — BP 118/70 | HR 74 | Temp 98.1°F | Resp 16 | Ht 67.0 in | Wt 169.0 lb

## 2021-04-09 DIAGNOSIS — E785 Hyperlipidemia, unspecified: Secondary | ICD-10-CM | POA: Diagnosis not present

## 2021-04-09 DIAGNOSIS — Z23 Encounter for immunization: Secondary | ICD-10-CM | POA: Diagnosis not present

## 2021-04-09 DIAGNOSIS — R739 Hyperglycemia, unspecified: Secondary | ICD-10-CM

## 2021-04-09 DIAGNOSIS — I10 Essential (primary) hypertension: Secondary | ICD-10-CM | POA: Diagnosis not present

## 2021-04-09 LAB — LIPID PANEL
Cholesterol: 173 mg/dL (ref 0–200)
HDL: 39.6 mg/dL (ref >39.00)
LDL Cholesterol: 122 mg/dL — ABNORMAL HIGH (ref 0–99)
NonHDL: 133.32
Total CHOL/HDL Ratio: 4
Triglycerides: 58 mg/dL (ref 0.0–149.0)
VLDL: 11.6 mg/dL (ref 0.0–40.0)

## 2021-04-09 LAB — COMPREHENSIVE METABOLIC PANEL
ALT: 14 U/L (ref 0–53)
AST: 14 U/L (ref 0–37)
Albumin: 4.3 g/dL (ref 3.5–5.2)
Alkaline Phosphatase: 72 U/L (ref 39–117)
BUN: 19 mg/dL (ref 6–23)
CO2: 28 mEq/L (ref 19–32)
Calcium: 9.5 mg/dL (ref 8.4–10.5)
Chloride: 103 mEq/L (ref 96–112)
Creatinine, Ser: 1.07 mg/dL (ref 0.40–1.50)
GFR: 72.19 mL/min (ref >60.00)
Glucose, Bld: 101 mg/dL — ABNORMAL HIGH (ref 70–99)
Potassium: 4.6 mEq/L (ref 3.5–5.1)
Sodium: 136 mEq/L (ref 135–145)
Total Bilirubin: 0.6 mg/dL (ref 0.2–1.2)
Total Protein: 7 g/dL (ref 6.0–8.3)

## 2021-04-09 LAB — HEMOGLOBIN A1C: Hgb A1c MFr Bld: 5.7 % (ref 4.6–6.5)

## 2021-04-09 MED ORDER — LABETALOL HCL 100 MG PO TABS
ORAL_TABLET | ORAL | 0 refills | Status: DC
Start: 2021-04-09 — End: 2021-07-14

## 2021-04-09 NOTE — Progress Notes (Signed)
Subjective:   By signing my name below, I, Shehryar Baig, attest that this documentation has been prepared under the direction and in the presence of Debbrah Alar NP. 04/09/2021    Patient ID: Matthew Lee, male    DOB: 1956-04-10, 65 y.o.   MRN: [id number]  Chief Complaint  Patient presents with   Medication Refill    Medication Refill  Patient is in today for office visit.  He denies having any leg swelling at this time.  Blood pressure- He started taking 150 mg labetalol daily PO instead of 200 mg and reports doing well while taking it.  BP Readings from Last 3 Encounters:  04/09/21 118/70  11/15/20 108/72  05/14/20 124/70   Colonoscopy- He is due for a colonoscopy and plans to schedules an appointment at a later date. Immunizations- He has 3 Covid-19 vaccines and is willing to get the 2nd booster vaccine.    Health Maintenance Due  Topic Date Due   COVID-19 Vaccine (3 - Booster for Moderna series) 05/28/2020   PNA vac Low Risk Adult (2 of 2 - PCV13) 01/28/2021    Past Medical History:  Diagnosis Date   Diverticulitis    Headache(784.0)    Hepatitis C 08/24/2012   TX and cured per pt   History of tobacco abuse    Hypertension    long standing since 2007, was treated for sometime, but then discontinued taking meds on his own.   Pulmonary edema, acute ([place])    hospitalized in 09/2009 with hypertensive crisis and acute pulmonary edema- improved with lasix nad labetalol.    Past Surgical History:  Procedure Laterality Date   ESOPHAGOGASTRODUODENOSCOPY  08/21/2011   Procedure: ESOPHAGOGASTRODUODENOSCOPY (EGD);  Surgeon: Juanita Craver, MD;  Location: [facility];  Service: Endoscopy;  Laterality: N/A;   TONSILLECTOMY      Family History  Problem Relation Age of Onset   Polycystic kidney disease Father    Colon cancer Neg Hx    Esophageal cancer Neg Hx    Stomach cancer Neg Hx     Social History   Socioeconomic History   Marital status: Married     Spouse name: Not on file   Number of children: Not on file   Years of education: Not on file   Highest education level: Not on file  Occupational History   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   Occupation: Lobbyist: [employer]  Tobacco Use   Smoking status: Former    Packs/day: 1.00    Years: 40.00    Pack years: 40.00    Types: Cigarettes    Quit date: 09/23/2009    Years since quitting: 11.5   Smokeless tobacco: Never  Vaping Use   Vaping Use: Never used  Substance and Sexual Activity   Alcohol use: Yes    Comment: hx of heavy ETOH abuse-1 glass wine monthly   Drug use: No   Sexual activity: Not on file  Other Topics Concern   Not on file  Social History Narrative   Lives at home with wife, daughter and her 2 children.   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         Social Determinants of Health   Financial Resource Strain: Not on file  Food Insecurity: Not on file  Transportation Needs: Not on file  Physical Activity: Not on file  Stress:  Not on file  Social Connections: Not on file  Intimate Partner Violence: Not on file    Outpatient Medications Prior to Visit  Medication Sig Dispense Refill   amLODipine (NORVASC) 10 MG tablet TAKE 1 TABLET DAILY 90 tablet 1   losartan (COZAAR) 50 MG tablet TAKE 1 TABLET DAILY 90 tablet 1   labetalol (NORMODYNE) 100 MG tablet TAKE ONE AND ONE-HALF TABLETS TWICE A DAY 270 tablet 0   No facility-administered medications prior to visit.    No Known Allergies  Review of Systems  Cardiovascular:  Negative for leg swelling.      Objective:    Physical Exam Constitutional:      General: He is not in acute distress.    Appearance: Normal appearance. He is not ill-appearing.  HENT:     Head: Normocephalic and atraumatic.     Right Ear: External ear normal.     Left Ear: External ear normal.  Eyes:      Extraocular Movements: Extraocular movements intact.     Pupils: Pupils are equal, round, and reactive to light.  Cardiovascular:     Rate and Rhythm: Normal rate and regular rhythm.     Pulses: Normal pulses.     Heart sounds: Normal heart sounds. No murmur heard.   No gallop.  Pulmonary:     Effort: Pulmonary effort is normal. No respiratory distress.     Breath sounds: Normal breath sounds. No wheezing, rhonchi or rales.  Skin:    General: Skin is warm and dry.  Neurological:     Mental Status: He is alert and oriented to person, place, and time.  Psychiatric:        Behavior: Behavior normal.    BP 118/70 (BP Location: Left Arm, Patient Position: Sitting, Cuff Size: Normal)   Pulse 74   Temp 98.1 F (36.7 C)   Resp 16   Ht _0  (1.702 m)   Wt 169 lb (76.7 kg)   SpO2 97%   BMI 26.47 kg/m  Wt Readings from Last 3 Encounters:  04/09/21 169 lb (76.7 kg)  11/15/20 159 lb (72.1 kg)  05/14/20 165 lb (74.8 kg)       Assessment & Plan:   Problem List Items Addressed This Visit       Unprioritized   Hyperglycemia   Relevant Orders   Hemoglobin A1c   Essential hypertension    BP is stable. Advised OK to continue labetalol at 18m bid instead of 1512mbid. Continue losartan 5024mnd amlodipine 42m61m      Relevant Medications   labetalol (NORMODYNE) 100 MG tablet   Other Relevant Orders   Comp Met (CMET)   Other Visit Diagnoses     Dyslipidemia    -  Primary   Relevant Orders   Lipid panel        Meds ordered this encounter  Medications   labetalol (NORMODYNE) 100 MG tablet    Sig: TAKE ONE TABLET TWICE A DAY    Dispense:  270 tablet    Refill:  0    Order Specific Question:   Supervising Provider    Answer:   BLYTPenni [id number]]CV 20 today. Pt will get Covid booster downstairs a the pharmacy.   I, MeliDebbrah Alar personally preformed the services described in this documentation.  All medical record entries made by the scribe  were at my direction and in my presence.  I have reviewed the chart and  discharge instructions (if applicable) and agree that the record reflects my personal performance and is accurate and complete. 04/09/2021   I,Shehryar Baig,acting as a Education administrator for Nance Pear, NP.,have documented all relevant documentation on the behalf of Nance Pear, NP,as directed by  Nance Pear, NP while in the presence of Nance Pear, NP.   Nance Pear, NP

## 2021-04-09 NOTE — Assessment & Plan Note (Addendum)
BP is stable. Advised OK to continue labetalol at 100mg  bid instead of 150mg  bid. Continue losartan 50mg  and amlodipine 10mg .

## 2021-04-09 NOTE — Assessment & Plan Note (Signed)
>>  ASSESSMENT AND PLAN FOR ESSENTIAL HYPERTENSION WRITTEN ON 04/09/2021  8:13 AM BY O'SULLIVAN, Ayanah Snader, NP  BP is stable. Advised OK to continue labetalol  at 100mg  bid instead of 150mg  bid. Continue losartan  50mg  and amlodipine  10mg .

## 2021-04-09 NOTE — Patient Instructions (Signed)
Please complete lab work prior to leaving.   

## 2021-05-29 ENCOUNTER — Other Ambulatory Visit: Payer: Self-pay

## 2021-05-29 ENCOUNTER — Ambulatory Visit (INDEPENDENT_AMBULATORY_CARE_PROVIDER_SITE_OTHER): Payer: Medicare Other

## 2021-05-29 VITALS — BP 130/82 | HR 66 | Temp 98.1°F | Resp 16 | Ht 67.0 in | Wt 167.6 lb

## 2021-05-29 DIAGNOSIS — Z0000 Encounter for general adult medical examination without abnormal findings: Secondary | ICD-10-CM

## 2021-05-29 NOTE — Progress Notes (Signed)
Subjective:   Matthew Lee is a 65 y.o. male who presents for an Initial Medicare Annual Wellness Visit.  Review of Systems     Cardiac Risk Factors include: advanced age (>61men, >63 women);hypertension;male gender     Objective:    Today's Vitals   05/29/21 0858  BP: 130/82  Pulse: 66  Resp: 16  Temp: 98.1 F (36.7 C)  TempSrc: Temporal  SpO2: 96%  Weight: 167 lb 9.6 oz (76 kg)  Height: 5\' 7"  (1.702 m)   Body mass index is 26.25 kg/m.  Advanced Directives 05/29/2021 08/21/2011  Does Patient Have a Medical Advance Directive? No Patient does not have advance directive  Would patient like information on creating a medical advance directive? Yes (MAU/Ambulatory/Procedural Areas - Information given) -  Pre-existing out of facility DNR order (yellow form or pink MOST form) - No    Current Medications (verified) Outpatient Encounter Medications as of 05/29/2021  Medication Sig   amLODipine (NORVASC) 10 MG tablet TAKE 1 TABLET DAILY   labetalol (NORMODYNE) 100 MG tablet TAKE ONE TABLET TWICE A DAY   losartan (COZAAR) 50 MG tablet TAKE 1 TABLET DAILY   No facility-administered encounter medications on file as of 05/29/2021.    Allergies (verified) Patient has no known allergies.   History: Past Medical History:  Diagnosis Date   Diverticulitis    Headache(784.0)    Hepatitis C 08/24/2012   TX and cured per pt   History of tobacco abuse    Hypertension    long standing since 2007, was treated for sometime, but then discontinued taking meds on his own.   Pulmonary edema, acute (HCC)    hospitalized in 09/2009 with hypertensive crisis and acute pulmonary edema- improved with lasix nad labetalol.   Past Surgical History:  Procedure Laterality Date   ESOPHAGOGASTRODUODENOSCOPY  08/21/2011   Procedure: ESOPHAGOGASTRODUODENOSCOPY (EGD);  Surgeon: 08/23/2011, MD;  Location: [facility];  Service: Endoscopy;  Laterality: N/A;   TONSILLECTOMY     Family History  Problem  Relation Age of Onset   Polycystic kidney disease Father    Colon cancer Neg Hx    Esophageal cancer Neg Hx    Stomach cancer Neg Hx    Social History   Socioeconomic History   Marital status: Married    Spouse name: Not on file   Number of children: Not on file   Years of education: Not on file   Highest education level: Not on file  Occupational History   Occupation: Works in [employer]- for last 25 years.   Occupation: Transport planner: [employer]  Tobacco Use   Smoking status: Former    Packs/day: 1.00    Years: 40.00    Pack years: 40.00    Types: Cigarettes, E-cigarettes    Quit date: 09/23/2009    Years since quitting: 11.6   Smokeless tobacco: Never  Vaping Use   Vaping Use: Never used  Substance and Sexual Activity   Alcohol use: Not Currently   Drug use: No   Sexual activity: Not on file  Other Topics Concern   Not on file  Social History Narrative   Lives at home with wife, daughter and her 2 children.   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         Social Determinants of Health   Financial Resource Strain: Low Risk    Difficulty of  Paying Living Expenses: Not hard at all  Food Insecurity: No Food Insecurity   Worried About Running Out of Food in the Last Year: Never true   Ran Out of Food in the Last Year: Never true  Transportation Needs: No Transportation Needs   Lack of Transportation (Medical): No   Lack of Transportation (Non-Medical): No  Physical Activity: Inactive   Days of Exercise per Week: 0 days   Minutes of Exercise per Session: 0 min  Stress: No Stress Concern Present   Feeling of Stress : Only a little  Social Connections: Moderately Isolated   Frequency of Communication with Friends and Family: More than three times a week   Frequency of Social Gatherings with Friends and Family: More than three times a week   Attends Religious  Services: Never   Database administratorActive Member of Clubs or Organizations: No   Attends Engineer, structuralClub or Organization Meetings: Never   Marital Status: Married    Tobacco Counseling Counseling given: Not Answered   Clinical Intake:  Pre-visit preparation completed: Yes  Pain : No/denies pain     BMI - recorded: 26.25 Nutritional Status: BMI 25 -29 Overweight Nutritional Risks: None Diabetes: No  How often do you need to have someone help you when you read instructions, pamphlets, or other written materials from your doctor or pharmacy?: 1 - Never  Diabetic?No  Interpreter Needed?: No  Information entered by :: Thomasenia SalesMartha Vivienne Sangiovanni LPN   Activities of Daily Living In your present state of health, do you have any difficulty performing the following activities: 05/29/2021  Hearing? N  Vision? N  Difficulty concentrating or making decisions? N  Walking or climbing stairs? N  Dressing or bathing? N  Doing errands, shopping? N  Preparing Food and eating ? N  Using the Toilet? N  In the past six months, have you accidently leaked urine? N  Do you have problems with loss of bowel control? N  Managing your Medications? N  Managing your Finances? N  Housekeeping or managing your Housekeeping? N  Some recent data might be hidden    Patient Care Team: Sandford Craze'Sullivan, Melissa, NP as PCP - General (Internal Medicine)  Indicate any recent Medical Services you may have received from other than [facility] providers in the past year (date may be approximate).     Assessment:   This is a routine wellness examination for Matthew AshCarl.  Hearing/Vision screen Hearing Screening - Comments:: No hearing loss. C/o tinnitus Vision Screening - Comments:: Last eye exam-10 years ago  Dietary issues and exercise activities discussed: Current Exercise Habits: The patient does not participate in regular exercise at present, Exercise limited by: None identified   Goals Addressed             This Visit's Progress    Patient  Stated       Increase activity       Depression Screen PHQ 2/9 Scores 05/29/2021 04/09/2021 11/15/2020 01/18/2017  PHQ - 2 Score 0 0 0 0  PHQ- 9 Score - - - 1    Fall Risk Fall Risk  05/29/2021 04/09/2021  Falls in the past year? 0 0  Number falls in past yr: 0 -  Injury with Fall? 0 -  Follow up Falls prevention discussed -    FALL RISK PREVENTION PERTAINING TO THE HOME:  Any stairs in or around the home? Yes  If so, are there any without handrails? No  Home free of loose throw rugs in walkways, pet beds, electrical cords, etc?  Yes  Adequate lighting in your home to reduce risk of falls? Yes   ASSISTIVE DEVICES UTILIZED TO PREVENT FALLS:  Life alert? No  Use of a cane, walker or w/c? No  Grab bars in the bathroom? Yes  Shower chair or bench in shower? No  Elevated toilet seat or a handicapped toilet? No   TIMED UP AND GO:  Was the test performed? Yes .  Length of time to ambulate 10 feet: 10 sec.   Gait steady and fast without use of assistive device  Cognitive Function:Normal cognitive status assessed by direct observation by this Nurse Health Advisor. No abnormalities found.          Immunizations Immunization History  Administered Date(s) Administered   Fluad Quad(high Dose 65+) 06/28/2020   Hep A / Hep B 06/05/2013, 07/05/2013, 12/04/2013   Influenza Inj Mdck Quad Pf 07/06/2018   Influenza Whole 09/16/2009   Influenza,inj,Quad PF,6+ Mos 06/05/2013, 07/12/2014, 07/27/2015   Influenza-Unspecified 06/28/2012, 07/12/2014   Moderna Sars-Covid-2 Vaccination 11/10/2019, 12/27/2019   PNEUMOCOCCAL CONJUGATE-20 04/09/2021   Pneumococcal Polysaccharide-23 12/31/2014, 01/29/2020   Pneumococcal-Unspecified 08/28/2009   Tdap 03/15/2013   Zoster Recombinat (Shingrix) 03/15/2020, 05/15/2020    TDAP status: Up to date  Flu Vaccine status: Due, Education has been provided regarding the importance of this vaccine. Advised may receive this vaccine at local pharmacy or  Health Dept. Aware to provide a copy of the vaccination record if obtained from local pharmacy or Health Dept. Verbalized acceptance and understanding.  Pneumococcal vaccine status: Up to date  Covid-19 vaccine status: Information provided on how to obtain vaccines. Booster due  Qualifies for Shingles Vaccine? No   Zostavax completed No   Shingrix Completed?: Yes  Screening Tests Health Maintenance  Topic Date Due   COVID-19 Vaccine (3 - Booster for Moderna series) 05/28/2020   INFLUENZA VACCINE  04/28/2021   COLONOSCOPY (Pts 45-29yrs Insurance coverage will need to be confirmed)  04/09/2022 (Originally 07/11/1999)   PNA vac Low Risk Adult (2 of 2 - PCV13) 04/09/2022   TETANUS/TDAP  03/16/2023   Hepatitis C Screening  Completed   Zoster Vaccines- Shingrix  Completed   HPV VACCINES  Aged Out    Health Maintenance  Health Maintenance Due  Topic Date Due   COVID-19 Vaccine (3 - Booster for Moderna series) 05/28/2020   INFLUENZA VACCINE  04/28/2021   Colorectal cancer screening: Due-Declined   Lung Cancer Screening: (Low Dose CT Chest recommended if Age 30-80 years, 30 pack-year currently smoking OR have quit w/in 15years.) does qualify.   Lung Cancer Screening Referral: Declined  Additional Screening:  Hepatitis C Screening:Chronic Hep C  Vision Screening: Recommended annual ophthalmology exams for early detection of glaucoma and other disorders of the eye. Is the patient up to date with their annual eye exam?  No  Who is the provider or what is the name of the office in which the patient attends annual eye exams? None-Patient plans to make an appt soon   Dental Screening: Recommended annual dental exams for proper oral hygiene  Community Resource Referral / Chronic Care Management: CRR required this visit?  No   CCM required this visit?  No      Plan:     I have personally reviewed and noted the following in the patient's chart:   Medical and social  history Use of alcohol, tobacco or illicit drugs  Current medications and supplements including opioid prescriptions. Patient is not currently taking opioid prescriptions. Functional ability and status Nutritional  status Physical activity Advanced directives List of other physicians Hospitalizations, surgeries, and ER visits in previous 12 months Vitals Screenings to include cognitive, depression, and falls Referrals and appointments  In addition, I have reviewed and discussed with patient certain preventive protocols, quality metrics, and best practice recommendations. A written personalized care plan for preventive services as well as general preventive health recommendations were provided to patient.   Patient would like to access avs on mychart.  Roanna Raider, LPN   01/30/7321  Nurse Health Advisor  Nurse Notes: None

## 2021-05-29 NOTE — Patient Instructions (Signed)
Mr. Matthew Lee , Thank you for taking time to come for your Medicare Wellness Visit. I appreciate your ongoing commitment to your health goals. Please review the following plan we discussed and let me know if I can assist you in the future.   Screening recommendations/referrals: Colonoscopy: Due-Declined today. Please cal the office to schedule if you change your mind. Recommended yearly ophthalmology/optometry visit for glaucoma screening and checkup Recommended yearly dental visit for hygiene and checkup  Vaccinations: Influenza vaccine: Due-May obtain vaccine at our office or your local pharmacy. Pneumococcal vaccine: Up to date Tdap vaccine: Up to date-Due-02/2023 Shingles vaccine: Completed vaccines   Covid-19: Booster due  Advanced directives: Information given today  Conditions/risks identified: See problem list  Next appointment: Follow up in one year for your annual wellness visit. 06/02/2022 @ 9:00  Preventive Care 67 Years and Older, Male Preventive care refers to lifestyle choices and visits with your health care provider that can promote health and wellness. What does preventive care include? A yearly physical exam. This is also called an annual well check. Dental exams once or twice a year. Routine eye exams. Ask your health care provider how often you should have your eyes checked. Personal lifestyle choices, including: Daily care of your teeth and gums. Regular physical activity. Eating a healthy diet. Avoiding tobacco and drug use. Limiting alcohol use. Practicing safe sex. Taking low doses of aspirin every day. Taking vitamin and mineral supplements as recommended by your health care provider. What happens during an annual well check? The services and screenings done by your health care provider during your annual well check will depend on your age, overall health, lifestyle risk factors, and family history of disease. Counseling  Your health care provider may ask you  questions about your: Alcohol use. Tobacco use. Drug use. Emotional well-being. Home and relationship well-being. Sexual activity. Eating habits. History of falls. Memory and ability to understand (cognition). Work and work Astronomer. Screening  You may have the following tests or measurements: Height, weight, and BMI. Blood pressure. Lipid and cholesterol levels. These may be checked every 5 years, or more frequently if you are over 55 years old. Skin check. Lung cancer screening. You may have this screening every year starting at age 48 if you have a 30-pack-year history of smoking and currently smoke or have quit within the past 15 years. Fecal occult blood test (FOBT) of the stool. You may have this test every year starting at age 30. Flexible sigmoidoscopy or colonoscopy. You may have a sigmoidoscopy every 5 years or a colonoscopy every 10 years starting at age 37. Prostate cancer screening. Recommendations will vary depending on your family history and other risks. Hepatitis C blood test. Hepatitis B blood test. Sexually transmitted disease (STD) testing. Diabetes screening. This is done by checking your blood sugar (glucose) after you have not eaten for a while (fasting). You may have this done every 1-3 years. Abdominal aortic aneurysm (AAA) screening. You may need this if you are a current or former smoker. Osteoporosis. You may be screened starting at age 57 if you are at high risk. Talk with your health care provider about your test results, treatment options, and if necessary, the need for more tests. Vaccines  Your health care provider may recommend certain vaccines, such as: Influenza vaccine. This is recommended every year. Tetanus, diphtheria, and acellular pertussis (Tdap, Td) vaccine. You may need a Td booster every 10 years. Zoster vaccine. You may need this after age 62. Pneumococcal 13-valent  conjugate (PCV13) vaccine. One dose is recommended after age  23. Pneumococcal polysaccharide (PPSV23) vaccine. One dose is recommended after age 46. Talk to your health care provider about which screenings and vaccines you need and how often you need them. This information is not intended to replace advice given to you by your health care provider. Make sure you discuss any questions you have with your health care provider. Document Released: 10/11/2015 Document Revised: 06/03/2016 Document Reviewed: 07/16/2015 Elsevier Interactive Patient Education  2017 [employer] Prevention in the Home Falls can cause injuries. They can happen to people of all ages. There are many things you can do to make your home safe and to help prevent falls. What can I do on the outside of my home? Regularly fix the edges of walkways and driveways and fix any cracks. Remove anything that might make you trip as you walk through a door, such as a raised step or threshold. Trim any bushes or trees on the path to your home. Use bright outdoor lighting. Clear any walking paths of anything that might make someone trip, such as rocks or tools. Regularly check to see if handrails are loose or broken. Make sure that both sides of any steps have handrails. Any raised decks and porches should have guardrails on the edges. Have any leaves, snow, or ice cleared regularly. Use sand or salt on walking paths during winter. Clean up any spills in your garage right away. This includes oil or grease spills. What can I do in the bathroom? Use night lights. Install grab bars by the toilet and in the tub and shower. Do not use towel bars as grab bars. Use non-skid mats or decals in the tub or shower. If you need to sit down in the shower, use a plastic, non-slip stool. Keep the floor dry. Clean up any water that spills on the floor as soon as it happens. Remove soap buildup in the tub or shower regularly. Attach bath mats securely with double-sided non-slip rug tape. Do not have throw  rugs and other things on the floor that can make you trip. What can I do in the bedroom? Use night lights. Make sure that you have a light by your bed that is easy to reach. Do not use any sheets or blankets that are too big for your bed. They should not hang down onto the floor. Have a firm chair that has side arms. You can use this for support while you get dressed. Do not have throw rugs and other things on the floor that can make you trip. What can I do in the kitchen? Clean up any spills right away. Avoid walking on wet floors. Keep items that you use a lot in easy-to-reach places. If you need to reach something above you, use a strong step stool that has a grab bar. Keep electrical cords out of the way. Do not use floor polish or wax that makes floors slippery. If you must use wax, use non-skid floor wax. Do not have throw rugs and other things on the floor that can make you trip. What can I do with my stairs? Do not leave any items on the stairs. Make sure that there are handrails on both sides of the stairs and use them. Fix handrails that are broken or loose. Make sure that handrails are as long as the stairways. Check any carpeting to make sure that it is firmly attached to the stairs. Fix any carpet that  is loose or worn. Avoid having throw rugs at the top or bottom of the stairs. If you do have throw rugs, attach them to the floor with carpet tape. Make sure that you have a light switch at the top of the stairs and the bottom of the stairs. If you do not have them, ask someone to add them for you. What else can I do to help prevent falls? Wear shoes that: Do not have high heels. Have rubber bottoms. Are comfortable and fit you well. Are closed at the toe. Do not wear sandals. If you use a stepladder: Make sure that it is fully opened. Do not climb a closed stepladder. Make sure that both sides of the stepladder are locked into place. Ask someone to hold it for you, if  possible. Clearly mark and make sure that you can see: Any grab bars or handrails. First and last steps. Where the edge of each step is. Use tools that help you move around (mobility aids) if they are needed. These include: Canes. Walkers. Scooters. Crutches. Turn on the lights when you go into a dark area. Replace any light bulbs as soon as they burn out. Set up your furniture so you have a clear path. Avoid moving your furniture around. If any of your floors are uneven, fix them. If there are any pets around you, be aware of where they are. Review your medicines with your doctor. Some medicines can make you feel dizzy. This can increase your chance of falling. Ask your doctor what other things that you can do to help prevent falls. This information is not intended to replace advice given to you by your health care provider. Make sure you discuss any questions you have with your health care provider. Document Released: 07/11/2009 Document Revised: 02/20/2016 Document Reviewed: 10/19/2014 Elsevier Interactive Patient Education  2017 [employer].

## 2021-07-14 ENCOUNTER — Other Ambulatory Visit: Payer: Self-pay | Admitting: Family

## 2021-07-14 DIAGNOSIS — I10 Essential (primary) hypertension: Secondary | ICD-10-CM

## 2021-08-24 ENCOUNTER — Encounter: Payer: Self-pay | Admitting: Family

## 2021-08-26 ENCOUNTER — Encounter: Payer: Self-pay | Admitting: Family

## 2021-08-26 ENCOUNTER — Ambulatory Visit (INDEPENDENT_AMBULATORY_CARE_PROVIDER_SITE_OTHER): Payer: Medicare Other | Admitting: Family

## 2021-08-26 ENCOUNTER — Other Ambulatory Visit: Payer: Self-pay

## 2021-08-26 VITALS — BP 140/80 | HR 72 | Temp 97.5°F | Ht 67.0 in | Wt 170.8 lb

## 2021-08-26 DIAGNOSIS — J069 Acute upper respiratory infection, unspecified: Secondary | ICD-10-CM

## 2021-08-26 MED ORDER — AMOXICILLIN-POT CLAVULANATE 875-125 MG PO TABS
1.0000 | ORAL_TABLET | Freq: Two times a day (BID) | ORAL | 0 refills | Status: AC
Start: 2021-08-26 — End: 2021-09-05

## 2021-08-26 NOTE — Progress Notes (Signed)
Matthew Lee is a 65 y.o. male with the following history as recorded in [facility]:  Patient Active Problem List   Diagnosis Date Noted   Gall bladder polyp 02/18/2015   Preventative health care 12/31/2014   Hyperglycemia 11/22/2014   GERD (gastroesophageal reflux disease) 09/07/2013   Hepatitis C 08/24/2012   Mild diastolic dysfunction 08/22/2012   Insomnia 10/31/2009   History of tobacco abuse 09/26/2009   Essential hypertension 09/26/2009    Current Outpatient Medications  Medication Sig Dispense Refill   amLODipine (NORVASC) 10 MG tablet TAKE 1 TABLET DAILY 90 tablet 1   amoxicillin-clavulanate (AUGMENTIN) 875-125 MG tablet Take 1 tablet by mouth 2 (two) times daily for 10 days. 20 tablet 0   labetalol (NORMODYNE) 100 MG tablet TAKE ONE AND ONE-HALF TABLETS TWICE A DAY 270 tablet 1   losartan (COZAAR) 50 MG tablet TAKE 1 TABLET DAILY 90 tablet 1   No current facility-administered medications for this visit.    Allergies: Patient has no known allergies.  Past Medical History:  Diagnosis Date   Diverticulitis    Headache(784.0)    Hepatitis C 08/24/2012   TX and cured per pt   History of tobacco abuse    Hypertension    long standing since 2007, was treated for sometime, but then discontinued taking meds on his own.   Pulmonary edema, acute (HCC)    hospitalized in 09/2009 with hypertensive crisis and acute pulmonary edema- improved with lasix nad labetalol.    Past Surgical History:  Procedure Laterality Date   ESOPHAGOGASTRODUODENOSCOPY  08/21/2011   Procedure: ESOPHAGOGASTRODUODENOSCOPY (EGD);  Surgeon: Charna Elizabeth, MD;  Location: [facility];  Service: Endoscopy;  Laterality: N/A;   TONSILLECTOMY      Family History  Problem Relation Age of Onset   Polycystic kidney disease Father    Colon cancer Neg Hx    Esophageal cancer Neg Hx    Stomach cancer Neg Hx     Social History   Tobacco Use   Smoking status: Former    Packs/day: 1.00    Years: 40.00    Pack  years: 40.00    Types: Cigarettes, E-cigarettes    Quit date: 09/23/2009    Years since quitting: 11.9   Smokeless tobacco: Never  Substance Use Topics   Alcohol use: Not Currently    Subjective:   Patient presents with concerns for possible sinus infection; went to [facility] yesterday and had negative COVID and flu test;  Using OTC Zicam- started yesterday and does feel somewhat better today;  No fever, chest pain or shortness of breath; concerned about "dark color" of nasal congestion.     Objective:  Vitals:   08/26/21 1537  BP: 140/80  Pulse: 72  Temp: (!) 97.5 F (36.4 C)  TempSrc: Oral  SpO2: 97%  Weight: 170 lb 12.8 oz (77.5 kg)  Height: 5\' 7"  (1.702 m)    General: Well developed, well nourished, in no acute distress  Skin : Warm and dry.  Head: Normocephalic and atraumatic  Eyes: Sclera and conjunctiva clear; pupils round and reactive to light; extraocular movements intact  Ears: External normal; canals clear; tympanic membranes normal  Oropharynx: Pink, supple. No suspicious lesions  Neck: Supple without thyromegaly, adenopathy  Lungs: Respirations unlabored;  Neurologic: Alert and oriented; speech intact; face symmetrical; moves all extremities well; CNII-XII intact without focal deficit   Assessment:  1. Upper respiratory tract infection, unspecified type     Plan:  Suspect viral and patient notes he is  starting to feel better in the past 12 hours; continue Zicam and increase fluids, rest; if symptoms persist or worsen after 48 hours, he will start antibiotic that is prescribed today.   This visit occurred during the SARS-CoV-2 public health emergency.  Safety protocols were in place, including screening questions prior to the visit, additional usage of staff PPE, and extensive cleaning of exam room while observing appropriate contact time as indicated for disinfecting solutions.    No follow-ups on file.  No orders of the defined types were placed in this  encounter.   Requested Prescriptions   Signed Prescriptions Disp Refills   amoxicillin-clavulanate (AUGMENTIN) 875-125 MG tablet 20 tablet 0    Sig: Take 1 tablet by mouth 2 (two) times daily for 10 days.

## 2021-08-27 ENCOUNTER — Encounter: Payer: Self-pay | Admitting: Family

## 2021-09-30 ENCOUNTER — Encounter: Payer: Self-pay | Admitting: Family

## 2021-09-30 ENCOUNTER — Ambulatory Visit (INDEPENDENT_AMBULATORY_CARE_PROVIDER_SITE_OTHER): Payer: Medicare Other | Admitting: Family

## 2021-09-30 VITALS — BP 132/74 | HR 71 | Temp 98.0°F | Ht 67.0 in | Wt 170.6 lb

## 2021-09-30 DIAGNOSIS — R739 Hyperglycemia, unspecified: Secondary | ICD-10-CM

## 2021-09-30 DIAGNOSIS — I10 Essential (primary) hypertension: Secondary | ICD-10-CM

## 2021-09-30 DIAGNOSIS — I519 Heart disease, unspecified: Secondary | ICD-10-CM

## 2021-09-30 DIAGNOSIS — K219 Gastro-esophageal reflux disease without esophagitis: Secondary | ICD-10-CM

## 2021-09-30 DIAGNOSIS — Z23 Encounter for immunization: Secondary | ICD-10-CM

## 2021-09-30 LAB — BASIC METABOLIC PANEL
BUN: 19 mg/dL (ref 6–23)
CO2: 26 mEq/L (ref 19–32)
Calcium: 9.5 mg/dL (ref 8.4–10.5)
Chloride: 101 mEq/L (ref 96–112)
Creatinine, Ser: 0.91 mg/dL (ref 0.40–1.50)
GFR: 87.39 mL/min (ref >60.00)
Glucose, Bld: 97 mg/dL (ref 70–99)
Potassium: 4.3 mEq/L (ref 3.5–5.1)
Sodium: 136 mEq/L (ref 135–145)

## 2021-09-30 MED ORDER — LOSARTAN POTASSIUM 50 MG PO TABS: 50.0000 mg | ORAL_TABLET | Freq: Every day | ORAL | 1 refills | Status: DC

## 2021-09-30 NOTE — Assessment & Plan Note (Signed)
Stable without medications.  Monitor.

## 2021-09-30 NOTE — Assessment & Plan Note (Signed)
BP is at goal. Continue current medications. Obtain follow up bmet.

## 2021-09-30 NOTE — Assessment & Plan Note (Signed)
Last A1C WNL. Reports that he is currently fasting. Wishes to check glucose today. Discussed weight loss.

## 2021-09-30 NOTE — Assessment & Plan Note (Signed)
>>  ASSESSMENT AND PLAN FOR ESSENTIAL HYPERTENSION WRITTEN ON 09/30/2021  7:20 AM BY O'SULLIVAN, Kyre Jeffries, NP  BP is at goal. Continue current medications. Obtain follow up bmet.

## 2021-09-30 NOTE — Progress Notes (Signed)
Subjective:     Patient ID: Matthew Lee, male    DOB: [birth year], 66 y.o.   MRN: [id number]  Chief Complaint  Patient presents with   Medical Management of Chronic Issues    6 m f/u     HPI Patient is in today for follow up.  HTN- bp today is 132/74. BP medications include amlodpine 10mg , labetalol 100mg  and losartan 50mg . Reports no issues with medications.  BP Readings from Last 3 Encounters:  09/30/21 132/74  08/26/21 140/80  05/29/21 130/82   GERD-  Reports no symptoms currently. Not on medications for gerd.    Hyperglycemia-  Lab Results  Component Value Date   HGBA1C 5.7 04/09/2021   Diastolic dysfunction- noted remotely on echo. Denies LE edema or DOE.   Wt Readings from Last 3 Encounters:  09/30/21 170 lb 9.6 oz (77.4 kg)  08/26/21 170 lb 12.8 oz (77.5 kg)  05/29/21 167 lb 9.6 oz (76 kg)   Has not received covid booster.   Health Maintenance Due  Topic Date Due   COVID-19 Vaccine (3 - Booster for Moderna series) 02/21/2020   INFLUENZA VACCINE  04/28/2021    Past Medical History:  Diagnosis Date   Diverticulitis    Headache(784.0)    Hepatitis C 08/24/2012   TX and cured per pt   History of tobacco abuse    Hypertension    long standing since 2007, was treated for sometime, but then discontinued taking meds on his own.   Pulmonary edema, acute (HCC)    hospitalized in 09/2009 with hypertensive crisis and acute pulmonary edema- improved with lasix nad labetalol.    Past Surgical History:  Procedure Laterality Date   ESOPHAGOGASTRODUODENOSCOPY  08/21/2011   Procedure: ESOPHAGOGASTRODUODENOSCOPY (EGD);  Surgeon: 2008, MD;  Location: [facility];  Service: Endoscopy;  Laterality: N/A;   TONSILLECTOMY      Family History  Problem Relation Age of Onset   Polycystic kidney disease Father    Colon cancer Neg Hx    Esophageal cancer Neg Hx    Stomach cancer Neg Hx     Social History   Socioeconomic History   Marital status: Married     Spouse name: Not on file   Number of children: Not on file   Years of education: Not on file   Highest education level: Not on file  Occupational History   Occupation: Works in 10/2009- for last 25 years.   Occupation: 08/23/2011: [employer]  Tobacco Use   Smoking status: Former    Packs/day: 1.00    Years: 40.00    Pack years: 40.00    Types: Cigarettes, E-cigarettes    Quit date: 09/23/2009    Years since quitting: 12.0   Smokeless tobacco: Never  Vaping Use   Vaping Use: Never used  Substance and Sexual Activity   Alcohol use: Not Currently   Drug use: No   Sexual activity: Not on file  Other Topics Concern   Not on file  Social History Narrative   Lives at home with wife, daughter and her 2 children.   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         Social Determinants of Health   Financial Resource Strain: Low Risk    Difficulty of Paying Living Expenses: Not hard at all  Food Insecurity: No Food Insecurity   Worried About Running  Out of Food in the Last Year: Never true   Ran Out of Food in the Last Year: Never true  Transportation Needs: No Transportation Needs   Lack of Transportation (Medical): No   Lack of Transportation (Non-Medical): No  Physical Activity: Inactive   Days of Exercise per Week: 0 days   Minutes of Exercise per Session: 0 min  Stress: No Stress Concern Present   Feeling of Stress : Only a little  Social Connections: Moderately Isolated   Frequency of Communication with Friends and Family: More than three times a week   Frequency of Social Gatherings with Friends and Family: More than three times a week   Attends Religious Services: Never   Database administratorActive Member of Clubs or Organizations: No   Attends Engineer, structuralClub or Organization Meetings: Never   Marital Status: Married  Catering managerntimate Partner Violence: Not At Risk   Fear of Current or Ex-Partner: No    Emotionally Abused: No   Physically Abused: No   Sexually Abused: No    Outpatient Medications Prior to Visit  Medication Sig Dispense Refill   amLODipine (NORVASC) 10 MG tablet TAKE 1 TABLET DAILY 90 tablet 1   labetalol (NORMODYNE) 100 MG tablet TAKE ONE AND ONE-HALF TABLETS TWICE A DAY 270 tablet 1   losartan (COZAAR) 50 MG tablet TAKE 1 TABLET DAILY 90 tablet 1   No facility-administered medications prior to visit.    No Known Allergies  ROS See HPI    Objective:    Physical Exam Constitutional:      General: He is not in acute distress.    Appearance: He is well-developed.  HENT:     Head: Normocephalic and atraumatic.  Cardiovascular:     Rate and Rhythm: Normal rate and regular rhythm.     Heart sounds: No murmur heard. Pulmonary:     Effort: Pulmonary effort is normal. No respiratory distress.     Breath sounds: Normal breath sounds. No wheezing or rales.  Skin:    General: Skin is warm and dry.  Neurological:     Mental Status: He is alert and oriented to person, place, and time.  Psychiatric:        Behavior: Behavior normal.        Thought Content: Thought content normal.    BP 132/74    Pulse 71    Temp 98 F (36.7 C) (Oral)    Ht 5\' 7"  (1.702 m)    Wt 170 lb 9.6 oz (77.4 kg)    SpO2 95%    BMI 26.72 kg/m  Wt Readings from Last 3 Encounters:  09/30/21 170 lb 9.6 oz (77.4 kg)  08/26/21 170 lb 12.8 oz (77.5 kg)  05/29/21 167 lb 9.6 oz (76 kg)       Assessment & Plan:   Problem List Items Addressed This Visit       Unprioritized   Mild diastolic dysfunction    Clinically compensated.  Monitor.       Hyperglycemia    Last A1C WNL. Reports that he is currently fasting. Wishes to check glucose today. Discussed weight loss.       GERD (gastroesophageal reflux disease)    Stable without medications.  Monitor.       Essential hypertension - Primary    BP is at goal. Continue current medications. Obtain follow up bmet.       Relevant  Medications   losartan (COZAAR) 50 MG tablet   Other Relevant Orders   Basic metabolic  panel   Flu shot today.  I have changed Claire Shown. Line's losartan. I am also having him maintain his amLODipine and labetalol.  Meds ordered this encounter  Medications   losartan (COZAAR) 50 MG tablet    Sig: Take 1 tablet (50 mg total) by mouth daily.    Dispense:  90 tablet    Refill:  1    Order Specific Question:   Supervising Provider    Answer:   Danise Edge A [[id number]]

## 2021-09-30 NOTE — Assessment & Plan Note (Signed)
Clinically compensated.  Monitor.  

## 2021-09-30 NOTE — Patient Instructions (Signed)
Please schedule an appointment for a covid booster.

## 2021-11-03 ENCOUNTER — Encounter: Payer: Self-pay | Admitting: Family

## 2022-02-15 ENCOUNTER — Other Ambulatory Visit: Payer: Self-pay | Admitting: Family

## 2022-02-15 DIAGNOSIS — I10 Essential (primary) hypertension: Secondary | ICD-10-CM

## 2022-03-30 ENCOUNTER — Ambulatory Visit: Payer: Medicare Other | Admitting: Family

## 2022-04-01 ENCOUNTER — Ambulatory Visit (INDEPENDENT_AMBULATORY_CARE_PROVIDER_SITE_OTHER): Payer: Medicare Other | Admitting: Family

## 2022-04-01 VITALS — BP 137/85 | HR 79 | Temp 98.5°F | Resp 18 | Ht 67.0 in | Wt 175.0 lb

## 2022-04-01 DIAGNOSIS — Z7185 Encounter for immunization safety counseling: Secondary | ICD-10-CM

## 2022-04-01 DIAGNOSIS — R739 Hyperglycemia, unspecified: Secondary | ICD-10-CM | POA: Diagnosis not present

## 2022-04-01 DIAGNOSIS — G4700 Insomnia, unspecified: Secondary | ICD-10-CM

## 2022-04-01 DIAGNOSIS — I10 Essential (primary) hypertension: Secondary | ICD-10-CM | POA: Diagnosis not present

## 2022-04-01 DIAGNOSIS — Z1211 Encounter for screening for malignant neoplasm of colon: Secondary | ICD-10-CM

## 2022-04-01 HISTORY — DX: Encounter for immunization safety counseling: Z71.85

## 2022-04-01 LAB — COMPREHENSIVE METABOLIC PANEL
ALT: 21 U/L (ref 0–53)
AST: 16 U/L (ref 0–37)
Albumin: 4.4 g/dL (ref 3.5–5.2)
Alkaline Phosphatase: 66 U/L (ref 39–117)
BUN: 20 mg/dL (ref 6–23)
CO2: 25 mEq/L (ref 19–32)
Calcium: 9.5 mg/dL (ref 8.4–10.5)
Chloride: 105 mEq/L (ref 96–112)
Creatinine, Ser: 0.97 mg/dL (ref 0.40–1.50)
GFR: 80.66 mL/min (ref >60.00)
Glucose, Bld: 114 mg/dL — ABNORMAL HIGH (ref 70–99)
Potassium: 4.7 mEq/L (ref 3.5–5.1)
Sodium: 137 mEq/L (ref 135–145)
Total Bilirubin: 0.7 mg/dL (ref 0.2–1.2)
Total Protein: 6.9 g/dL (ref 6.0–8.3)

## 2022-04-01 LAB — HEMOGLOBIN A1C: Hgb A1c MFr Bld: 5.8 % (ref 4.6–6.5)

## 2022-04-01 MED ORDER — LOSARTAN POTASSIUM 50 MG PO TABS: 50.0000 mg | ORAL_TABLET | Freq: Every day | ORAL | 1 refills | Status: DC

## 2022-04-01 NOTE — Assessment & Plan Note (Signed)
>>  ASSESSMENT AND PLAN FOR ESSENTIAL HYPERTENSION WRITTEN ON 04/01/2022  7:07 AM BY O'SULLIVAN, Persia Lintner, NP  BP stable. Continue labetalol , amlodipine  and losartan . Obtain follow up cmet.

## 2022-04-01 NOTE — Progress Notes (Signed)
Subjective:     Patient ID: Matthew Lee, male    DOB: [birth year], 66 y.o.   MRN: [id number]  Chief Complaint  Patient presents with   Follow-up    6 month    HPI Patient is in today for follow up.  C/o waking up.  Goes to bed 8-9 PM.  He will wake up 1-3 PM.  Has been happening for years off/on. Not any better on the weekends.  He drinks coffee in the AM. Stops by 2PM.  No alcohol use.   HTN- reports that he is continuing labetalol, amlodipine and losartan.  BP Readings from Last 3 Encounters:  04/01/22 137/85  09/30/21 132/74  08/26/21 140/80   Colon cancer screening, declines colonoscopy. Will consider cologuard.   Health Maintenance Due  Topic Date Due   COVID-19 Vaccine (3 - Moderna series) 02/21/2020    Past Medical History:  Diagnosis Date   Diverticulitis    Headache(784.0)    Hepatitis C 08/24/2012   TX and cured per pt   History of tobacco abuse    Hypertension    long standing since 2007, was treated for sometime, but then discontinued taking meds on his own.   Pulmonary edema, acute ([place])    hospitalized in 09/2009 with hypertensive crisis and acute pulmonary edema- improved with lasix nad labetalol.    Past Surgical History:  Procedure Laterality Date   ESOPHAGOGASTRODUODENOSCOPY  08/21/2011   Procedure: ESOPHAGOGASTRODUODENOSCOPY (EGD);  Surgeon: Juanita Craver, MD;  Location: [facility];  Service: Endoscopy;  Laterality: N/A;   TONSILLECTOMY      Family History  Problem Relation Age of Onset   Polycystic kidney disease Father    Colon cancer Neg Hx    Esophageal cancer Neg Hx    Stomach cancer Neg Hx     Social History   Socioeconomic History   Marital status: Married    Spouse name: Not on file   Number of children: Not on file   Years of education: Not on file   Highest education level: Not on file  Occupational History   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   Occupation: Lobbyist: [employer]  Tobacco Use   Smoking status: Former    Packs/day: 1.00    Years: 40.00    Total pack years: 40.00    Types: Cigarettes, E-cigarettes    Quit date: 09/23/2009    Years since quitting: 12.5   Smokeless tobacco: Never  Vaping Use   Vaping Use: Never used  Substance and Sexual Activity   Alcohol use: Not Currently   Drug use: No   Sexual activity: Not on file  Other Topics Concern   Not on file  Social History Narrative   Lives at home with wife, daughter and her 2 children.   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         Social Determinants of Health   Financial Resource Strain: Low Risk  (05/29/2021)   Overall Financial Resource Strain (CARDIA)    Difficulty of Paying Living Expenses: Not hard at all  Food Insecurity: No Food Insecurity (05/29/2021)   Hunger Vital Sign    Worried About Running Out of Food in the Last Year: Never true    Ran Out of Food in the Last Year: Never true  Transportation Needs: No Transportation Needs (05/29/2021)   PRAPARE -  Hydrologist (Medical): No    Lack of Transportation (Non-Medical): No  Physical Activity: Inactive (05/29/2021)   Exercise Vital Sign    Days of Exercise per Week: 0 days    Minutes of Exercise per Session: 0 min  Stress: No Stress Concern Present (05/29/2021)   [place]    Feeling of Stress : Only a little  Social Connections: Moderately Isolated (05/29/2021)   Social Connection and Isolation Panel [NHANES]    Frequency of Communication with Friends and Family: More than three times a week    Frequency of Social Gatherings with Friends and Family: More than three times a week    Attends Religious Services: Never    Marine scientist or Organizations: No    Attends Archivist Meetings: Never    Marital Status: Married  Arboriculturist Violence: Not At Risk (05/29/2021)   Humiliation, Afraid, Rape, and Kick questionnaire    Fear of Current or Ex-Partner: No    Emotionally Abused: No    Physically Abused: No    Sexually Abused: No    Outpatient Medications Prior to Visit  Medication Sig Dispense Refill   amLODipine (NORVASC) 10 MG tablet TAKE 1 TABLET DAILY 90 tablet 3   labetalol (NORMODYNE) 100 MG tablet TAKE ONE AND ONE-HALF TABLETS TWICE A DAY 270 tablet 1   losartan (COZAAR) 50 MG tablet Take 1 tablet (50 mg total) by mouth daily. 90 tablet 1   No facility-administered medications prior to visit.    No Known Allergies  ROS See HPI    Objective:    Physical Exam Constitutional:      General: He is not in acute distress.    Appearance: He is well-developed.  HENT:     Head: Normocephalic and atraumatic.  Cardiovascular:     Rate and Rhythm: Normal rate and regular rhythm.     Heart sounds: No murmur heard. Pulmonary:     Effort: Pulmonary effort is normal. No respiratory distress.     Breath sounds: Normal breath sounds. No wheezing or rales.  Skin:    General: Skin is warm and dry.  Neurological:     Mental Status: He is alert and oriented to person, place, and time.  Psychiatric:        Behavior: Behavior normal.        Thought Content: Thought content normal.     BP 137/85   Pulse 79   Temp 98.5 F (36.9 C)   Resp 18   Ht '5\' 7"'  (1.702 m)   Wt 175 lb (79.4 kg)   SpO2 97%   BMI 27.41 kg/m  Wt Readings from Last 3 Encounters:  04/01/22 175 lb (79.4 kg)  09/30/21 170 lb 9.6 oz (77.4 kg)  08/26/21 170 lb 12.8 oz (77.5 kg)       Assessment & Plan:   Problem List Items Addressed This Visit       Unprioritized   Insomnia    Encouraged pt to avoid screens for 90 minutes before bedtime, cut off caffeine at noon. Can add melatonin 77m po at bedtime.        Immunization counseling    Encouraged pt to get his covid bivalent booster shot.       Hyperglycemia    Lab Results   Component Value Date   HGBA1C 5.7 04/09/2021  Will recheck A1C.       Relevant  Orders   Hemoglobin A1c   Essential hypertension    BP stable. Continue labetalol, amlodipine and losartan. Obtain follow up cmet.       Relevant Medications   losartan (COZAAR) 50 MG tablet   Other Relevant Orders   Comp Met (CMET)   Other Visit Diagnoses     Colon cancer screening    -  Primary   Relevant Orders   Cologuard       I am having Dorian Furnace. Deeg maintain his amLODipine, labetalol, and losartan.  Meds ordered this encounter  Medications   losartan (COZAAR) 50 MG tablet    Sig: Take 1 tablet (50 mg total) by mouth daily.    Dispense:  90 tablet    Refill:  1    Order Specific Question:   Supervising Provider    Answer:   Penni Homans A [[id number]]

## 2022-04-01 NOTE — Assessment & Plan Note (Signed)
Encouraged pt to avoid screens for 90 minutes before bedtime, cut off caffeine at noon. Can add melatonin 1mg  po at bedtime.

## 2022-04-01 NOTE — Assessment & Plan Note (Signed)
Encouraged pt to get his covid bivalent booster shot.

## 2022-04-01 NOTE — Assessment & Plan Note (Signed)
Lab Results  Component Value Date   HGBA1C 5.7 04/09/2021   Will recheck A1C.

## 2022-04-01 NOTE — Assessment & Plan Note (Signed)
BP stable. Continue labetalol, amlodipine and losartan. Obtain follow up cmet.

## 2022-05-01 ENCOUNTER — Ambulatory Visit (INDEPENDENT_AMBULATORY_CARE_PROVIDER_SITE_OTHER): Payer: Medicare Other | Admitting: Family

## 2022-05-01 ENCOUNTER — Encounter: Payer: Self-pay | Admitting: Family

## 2022-05-01 VITALS — BP 136/86 | HR 77 | Resp 18 | Ht 67.0 in | Wt 172.0 lb

## 2022-05-01 DIAGNOSIS — R739 Hyperglycemia, unspecified: Secondary | ICD-10-CM | POA: Diagnosis not present

## 2022-05-01 LAB — BASIC METABOLIC PANEL
BUN: 19 mg/dL (ref 6–23)
CO2: 27 mEq/L (ref 19–32)
Calcium: 9.9 mg/dL (ref 8.4–10.5)
Chloride: 102 mEq/L (ref 96–112)
Creatinine, Ser: 0.99 mg/dL (ref 0.40–1.50)
GFR: 78.66 mL/min (ref >60.00)
Glucose, Bld: 76 mg/dL (ref 70–99)
Potassium: 4.3 mEq/L (ref 3.5–5.1)
Sodium: 138 mEq/L (ref 135–145)

## 2022-05-01 NOTE — Assessment & Plan Note (Addendum)
Lab Results  Component Value Date   HGBA1C 5.8 04/01/2022   Wt Readings from Last 3 Encounters:  05/01/22 172 lb (78 kg)  04/01/22 175 lb (79.4 kg)  09/30/21 170 lb 9.6 oz (77.4 kg)   Encouraged pt to continue his dietary efforts.  He would like to check his non-fasting glucose level today. We can repeat A1C next visit.

## 2022-05-01 NOTE — Progress Notes (Signed)
Subjective:   By signing my name below, I, Matthew Lee, attest that this documentation has been prepared under the direction and in the presence of Matthew Lee' Su[place], NP 05/01/2022   Patient ID: Matthew Lee, male    DOB: 10-19-1955, 66 y.o.   MRN: [id number]  Chief Complaint  Patient presents with   Follow-up    HPI Patient is in today for an office visit.  Blood Sugars: He states that he has been exercising more and decreasing his sugar consumption to manage his blood sugar levels. He is interested in getting his glucose levels checked. He denies of any family history of diabetes.  Lab Results  Component Value Date   HGBA1C 5.8 04/01/2022   Weight: His weight is decreasing since he started watching his diet and exercising more.  Wt Readings from Last 3 Encounters:  05/01/22 172 lb (78 kg)  04/01/22 175 lb (79.4 kg)  09/30/21 170 lb 9.6 oz (77.4 kg)   Cologuard: He reports that he has received his Cologuard but not sent in the specimen yet.   Family History: He reports that he has 3 half brothers, one half sister and one full brother. His half sister is on dialysis and has polycystic kidney disease. His other two half-brothers are also diagnosed with polycystic kidney disease.   Health Maintenance Due  Topic Date Due   COLONOSCOPY (Pts 45-82yrs Insurance coverage will need to be confirmed)  Never done   COVID-19 Vaccine (3 - Moderna series) 02/21/2020   INFLUENZA VACCINE  04/28/2022    Past Medical History:  Diagnosis Date   Diverticulitis    Headache(784.0)    Hepatitis C 08/24/2012   TX and cured per pt   History of tobacco abuse    Hypertension    long standing since 2007, was treated for sometime, but then discontinued taking meds on his own.   Pulmonary edema, acute ([facility])    hospitalized in 09/2009 with hypertensive crisis and acute pulmonary edema- improved with lasix nad labetalol.    Past Surgical History:  Procedure Laterality Date    ESOPHAGOGASTRODUODENOSCOPY  08/21/2011   Procedure: ESOPHAGOGASTRODUODENOSCOPY (EGD);  Surgeon: Charna Elizabeth, MD;  Location: [facility];  Service: Endoscopy;  Laterality: N/A;   TONSILLECTOMY      Family History  Problem Relation Age of Onset   Polycystic kidney disease Father    Polycystic kidney disease Half-Brother    Polycystic kidney disease Half-Brother    Polycystic kidney disease Half-Sister    Colon cancer Neg Hx    Esophageal cancer Neg Hx    Stomach cancer Neg Hx     Social History   Socioeconomic History   Marital status: Married    Spouse name: Not on file   Number of children: Not on file   Years of education: Not on file   Highest education level: Not on file  Occupational History   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   Occupation: Garment/textile technologist: [employer]  Tobacco Use   Smoking status: Former    Packs/day: 1.00    Years: 40.00    Total pack years: 40.00    Types: Cigarettes, E-cigarettes    Quit date: 09/23/2009    Years since quitting: 12.6   Smokeless tobacco: Never  Vaping Use   Vaping Use: Never used  Substance and Sexual Activity   Alcohol use: Not Currently   Drug use: No   Sexual activity: Not on file  Other Topics Concern   Not on file  Social History Narrative   Lives at home with wife, daughter and her 2 children.   Caffeine use:  1 daily   Works a Aeronautical engineer (Museum/gallery conservator)   Enjoys Optician, dispensing, Civil engineer, contracting.         Social Determinants of Health   Financial Resource Strain: Low Risk  (05/29/2021)   Overall Financial Resource Strain (CARDIA)    Difficulty of Paying Living Expenses: Not hard at all  Food Insecurity: No Food Insecurity (05/29/2021)   Hunger Vital Sign    Worried About Running Out of Food in the Last Year: Never true    Ran Out of Food in the Last Year: Never true  Transportation Needs: No Transportation Needs (05/29/2021)   PRAPARE - Doctor, general practice (Medical): No    Lack of Transportation (Non-Medical): No  Physical Activity: Inactive (05/29/2021)   Exercise Vital Sign    Days of Exercise per Week: 0 days    Minutes of Exercise per Session: 0 min  Stress: No Stress Concern Present (05/29/2021)   [employer] of Occupational Health - Occupational Stress Questionnaire    Feeling of Stress : Only a little  Social Connections: Moderately Isolated (05/29/2021)   Social Connection and Isolation Panel [NHANES]    Frequency of Communication with Friends and Family: More than three times a week    Frequency of Social Gatherings with Friends and Family: More than three times a week    Attends Religious Services: Never    Database administrator or Organizations: No    Attends Banker Meetings: Never    Marital Status: Married  Catering manager Violence: Not At Risk (05/29/2021)   Humiliation, Afraid, Rape, and Kick questionnaire    Fear of Current or Ex-Partner: No    Emotionally Abused: No    Physically Abused: No    Sexually Abused: No    Outpatient Medications Prior to Visit  Medication Sig Dispense Refill   amLODipine (NORVASC) 10 MG tablet TAKE 1 TABLET DAILY 90 tablet 3   labetalol (NORMODYNE) 100 MG tablet TAKE ONE AND ONE-HALF TABLETS TWICE A DAY 270 tablet 1   losartan (COZAAR) 50 MG tablet Take 1 tablet (50 mg total) by mouth daily. 90 tablet 1   No facility-administered medications prior to visit.    No Known Allergies  ROS See HPI    Objective:    Physical Exam Constitutional:      General: He is not in acute distress.    Appearance: Normal appearance. He is not ill-appearing.  HENT:     Head: Normocephalic and atraumatic.     Right Ear: External ear normal.     Left Ear: External ear normal.  Eyes:     Extraocular Movements: Extraocular movements intact.     Pupils: Pupils are equal, round, and reactive to light.  Cardiovascular:     Rate and Rhythm: Normal rate and regular  rhythm.     Heart sounds: Normal heart sounds. No murmur heard.    No gallop.  Pulmonary:     Effort: Pulmonary effort is normal. No respiratory distress.     Breath sounds: Normal breath sounds. No wheezing or rales.  Skin:    General: Skin is warm and dry.  Neurological:     Mental Status: He is alert and oriented to person, place, and time.  Psychiatric:        Mood and  Affect: Mood normal.        Behavior: Behavior normal.        Judgment: Judgment normal.     BP 136/86   Pulse 77   Resp 18   Ht 5\' 7"  (1.702 m)   Wt 172 lb (78 kg)   SpO2 98%   BMI 26.94 kg/m  Wt Readings from Last 3 Encounters:  05/01/22 172 lb (78 kg)  04/01/22 175 lb (79.4 kg)  09/30/21 170 lb 9.6 oz (77.4 kg)       Assessment & Plan:   Problem List Items Addressed This Visit       Unprioritized   Hyperglycemia - Primary    Lab Results  Component Value Date   HGBA1C 5.8 04/01/2022   Wt Readings from Last 3 Encounters:  05/01/22 172 lb (78 kg)  04/01/22 175 lb (79.4 kg)  09/30/21 170 lb 9.6 oz (77.4 kg)  Encouraged pt to continue his dietary efforts.  He would like to check his non-fasting glucose level today. We can repeat A1C next visit.        Relevant Orders   Basic metabolic panel   Encouraged pt to complete and return cologuard.   No orders of the defined types were placed in this encounter.   I, 11/28/21, NP, personally preformed the services described in this documentation.  All medical record entries made by the scribe were at my direction and in my presence.  I have reviewed the chart and discharge instructions (if applicable) and agree that the record reflects my personal performance and is accurate and complete. 05/01/2022   I,Amber Collins,acting as a scribe for 07/01/2022, NP.,have documented all relevant documentation on the behalf of Lemont Fillers, NP,as directed by  Lemont Fillers, NP while in the presence of Lemont Fillers,  NP.    Lemont Fillers, NP

## 2022-05-25 ENCOUNTER — Encounter: Payer: Medicare Other | Admitting: Nurse Practitioner

## 2022-05-25 NOTE — Progress Notes (Signed)
E-Visit for Urinary Problems ? ?Based on what you shared with me, I feel your condition warrants further evaluation and I recommend that you be seen for a face to face office visit.  Male bladder infections are not very common.  We worry about prostate or kidney conditions.  The standard of care is to examine the abdomen and kidneys, and to do a urine and blood test to make sure that something more serious is not going on.  We recommend that you see a provider today.  If your doctor's office is closed [facility] has the following Urgent Cares: ? ?  ?NOTE: You will not be charged for this e-visit. ? ?If you are having a true medical emergency please call 911.   ? ?  ? For an urgent face to face visit, [facility] has six urgent care centers for your convenience:  ?  ? [facility] ?Get Driving Directions ?[phone] ?[address] ?[place], [place] [address] ?  ? [facility] ?Get Driving Directions ?[phone] ?[address] ?[place], [place] [address] ? ?[facility] [facility] [place] ?Get Driving Directions ?[phone] ?[address] ?[place],  [place]  [address] ? ?[facility] ?Get Driving Directions ?[phone] ?[address] ?[place], [place] [address] ?  ?[facility] [place] ?Get Driving Directions  ?[phone] ?[address] ?[address] ?[place], [place] [address] ?  ? [facility] ?Get Driving Directions ?[phone] ?[address] ?[place], [place] [address] ? ?Your MyChart E-visit questionnaire answers were reviewed by a board certified advanced clinical practitioner to complete your personal care plan based on your specific symptoms.  Thank you for using e-Visits. ?

## 2022-05-26 ENCOUNTER — Ambulatory Visit (INDEPENDENT_AMBULATORY_CARE_PROVIDER_SITE_OTHER): Payer: Medicare Other | Admitting: Medical

## 2022-05-26 ENCOUNTER — Encounter: Payer: Self-pay | Admitting: Family

## 2022-05-26 VITALS — BP 140/80 | HR 84 | Temp 98.3°F | Resp 18 | Ht 67.0 in | Wt 174.0 lb

## 2022-05-26 DIAGNOSIS — R6883 Chills (without fever): Secondary | ICD-10-CM

## 2022-05-26 DIAGNOSIS — I10 Essential (primary) hypertension: Secondary | ICD-10-CM

## 2022-05-26 DIAGNOSIS — D72829 Elevated white blood cell count, unspecified: Secondary | ICD-10-CM

## 2022-05-26 DIAGNOSIS — R300 Dysuria: Secondary | ICD-10-CM | POA: Diagnosis not present

## 2022-05-26 DIAGNOSIS — R972 Elevated prostate specific antigen [PSA]: Secondary | ICD-10-CM

## 2022-05-26 DIAGNOSIS — R350 Frequency of micturition: Secondary | ICD-10-CM | POA: Diagnosis not present

## 2022-05-26 LAB — POCT URINALYSIS DIPSTICK
Bilirubin, UA: NEGATIVE
Glucose, UA: NEGATIVE
Ketones, UA: NEGATIVE
Nitrite, UA: NEGATIVE
Protein, UA: NEGATIVE
Spec Grav, UA: <=1.005 — AB (ref 1.010–1.025)
Urobilinogen, UA: 0.2 E.U./dL
pH, UA: 5 (ref 5.0–8.0)

## 2022-05-26 MED ORDER — SULFAMETHOXAZOLE-TRIMETHOPRIM 800-160 MG PO TABS: 1.0000 | ORAL_TABLET | Freq: Two times a day (BID) | ORAL | 0 refills | Status: DC

## 2022-05-26 MED ORDER — CIPROFLOXACIN HCL 500 MG PO TABS: 500.0000 mg | ORAL_TABLET | Freq: Two times a day (BID) | ORAL | 0 refills | Status: DC

## 2022-05-26 NOTE — Patient Instructions (Addendum)
Presently urethritis, prostatitis and uti in differential.(Other possibility as well).   Favoring prostatitis and uti.  Urine cloudy and some leukocytes presently.  Pending your test results will prescribe cipro. Hydrate well.   Will update you as test results come in.  Htn- bp on recheck. 137/80. Continue current bp meds. Recommend recheck at home and if close to 140/90 or higher let us know  Follow up in 2-3 weeks or sooner if needed.

## 2022-05-26 NOTE — Telephone Encounter (Signed)
Appt scheduled w/ Edward.  ?

## 2022-05-26 NOTE — Progress Notes (Signed)
Subjective:    Patient ID: Matthew Lee, male    DOB: 03-17-1956, 66 y.o.   MRN: [id number]  HPI Pt in with painful urination, some chills and some initial hesitant urination. Symptoms started on Sunday. He states feels mild run down.   No pain in back. No perineum pain.  Pt never had never had prostate.   Review of Systems  Constitutional:  Negative for chills, fatigue and fever.  Respiratory:  Negative for cough, chest tightness, shortness of breath and wheezing.   Cardiovascular:  Negative for chest pain and palpitations.  Gastrointestinal:  Negative for abdominal pain, blood in stool, constipation, diarrhea and nausea.  Genitourinary:  Positive for frequency and urgency. Negative for hematuria, scrotal swelling and testicular pain.  Musculoskeletal:  Negative for back pain, joint swelling, myalgias and neck stiffness.  Skin:  Negative for pallor and [place].  Neurological:  Negative for dizziness and light-headedness.  Psychiatric/Behavioral:  Negative for behavioral problems, decreased concentration and hallucinations.     Past Medical History:  Diagnosis Date   Diverticulitis    Headache(784.0)    Hepatitis C 08/24/2012   TX and cured per pt   History of tobacco abuse    Hypertension    long standing since 2007, was treated for sometime, but then discontinued taking meds on his own.   Pulmonary edema, acute ([facility])    hospitalized in 09/2009 with hypertensive crisis and acute pulmonary edema- improved with lasix nad labetalol.     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   Occupation: Garment/textile technologist: [employer]  Tobacco Use   Smoking status: Former    Packs/day: 1.00    Years: 40.00    Total pack years:  40.00    Types: Cigarettes, E-cigarettes    Quit date: 09/23/2009    Years since quitting: 12.6   Smokeless tobacco: Never  Vaping Use   Vaping Use: Never used  Substance and Sexual Activity   Alcohol use: Not Currently   Drug use: No   Sexual activity: Not on file  Other Topics Concern   Not on file  Social History Narrative   Lives at home with wife, daughter and her 2 children.   Caffeine use:  1 daily   Works a Aeronautical engineer (Museum/gallery conservator)   Enjoys Optician, dispensing, Civil engineer, contracting.         Social Determinants of Health   Financial Resource Strain: Low Risk  (05/29/2021)   Overall Financial Resource Strain (CARDIA)    Difficulty of Paying Living Expenses: Not hard at all  Food Insecurity: No Food Insecurity (05/29/2021)   Hunger Vital Sign    Worried About Running Out of Food in the Last Year: Never true    Ran Out of Food in the Last Year: Never true  Transportation Needs: No Transportation Needs (05/29/2021)   PRAPARE - Administrator, Civil Service (Medical): No    Lack of Transportation (Non-Medical): No  Physical Activity: Inactive (05/29/2021)   Exercise Vital Sign    Days of Exercise per Week: 0 days    Minutes of Exercise per Session: 0 min  Stress: No Stress Concern Present (05/29/2021)   [place]  [employer] of Occupational Health - Occupational Stress Questionnaire    Feeling of Stress : Only a little  Social Connections: Moderately Isolated (05/29/2021)   Social Connection and Isolation Panel [NHANES]    Frequency of Communication with Friends and Family: More than three times a week    Frequency of Social Gatherings with Friends and Family: More than three times a week    Attends Religious Services: Never    Database administrator or Organizations: No    Attends Banker Meetings: Never    Marital Status: Married  Catering manager Violence: Not At Risk (05/29/2021)   Humiliation, Afraid, Rape, and Kick questionnaire    Fear of Current  or Ex-Partner: No    Emotionally Abused: No    Physically Abused: No    Sexually Abused: No    Past Surgical History:  Procedure Laterality Date   ESOPHAGOGASTRODUODENOSCOPY  08/21/2011   Procedure: ESOPHAGOGASTRODUODENOSCOPY (EGD);  Surgeon: Charna Elizabeth, MD;  Location: [facility];  Service: Endoscopy;  Laterality: N/A;   TONSILLECTOMY      Family History  Problem Relation Age of Onset   Polycystic kidney disease Father    Polycystic kidney disease Half-Brother    Polycystic kidney disease Half-Brother    Polycystic kidney disease Half-Sister    Colon cancer Neg Hx    Esophageal cancer Neg Hx    Stomach cancer Neg Hx     No Known Allergies  Current Outpatient Medications on File Prior to Visit  Medication Sig Dispense Refill   amLODipine (NORVASC) 10 MG tablet TAKE 1 TABLET DAILY 90 tablet 3   labetalol (NORMODYNE) 100 MG tablet TAKE ONE AND ONE-HALF TABLETS TWICE A DAY 270 tablet 1   losartan (COZAAR) 50 MG tablet Take 1 tablet (50 mg total) by mouth daily. 90 tablet 1   No current facility-administered medications on file prior to visit.    BP (!) 140/80   Pulse 84   Temp 98.3 F (36.8 C)   Resp 18   Ht 5\' 7"  (1.702 m)   Wt 174 lb (78.9 kg)   SpO2 96%   BMI 27.25 kg/m        Objective:   Physical Exam  General Mental Status- Alert. General Appearance- Not in acute distress.    Chest and Lung Exam Auscultation: Breath Sounds:-Normal.  Cardiovascular Auscultation:Rythm- Regular. Murmurs & Other Heart Sounds:Auscultation of the heart reveals- No Murmurs.  Abdomen Inspection:-Inspeection Normal. Palpation/Percussion:Note:No mass. Palpation and Percussion of the abdomen reveal- Non Tender, Non Distended + BS, no rebound or guarding.   Neurologic Cranial Nerve exam:- CN III-XII intact(No nystagmus), symmetric smile. Strength:- 5/5 equal and symmetric strength both upper and lower extremities.       Assessment & Plan:   Patient Instructions   Presently urethritis, prostatitis and uti in differential.(Other possibility as well).  Urine cloudy and some leukocytes presently.  Pending your test results will prescribe cipro. Hydrate well.   Will update you as test results come in.  Htn- bp on recheck. 137/80. Continue current bp meds. Recommend recheck at home and if close to 140/90 or higher let know  Follow up in 2-3 weeks or sooner if needed.   [place], PA-C

## 2022-05-27 ENCOUNTER — Telehealth: Payer: Self-pay | Admitting: *Deleted

## 2022-05-27 ENCOUNTER — Encounter: Payer: Self-pay | Admitting: Medical

## 2022-05-27 LAB — CBC WITH DIFFERENTIAL/PLATELET
Basophils Absolute: 0.2 10*3/uL — ABNORMAL HIGH (ref 0.0–0.1)
Basophils Relative: 0.9 % (ref 0.0–3.0)
Eosinophils Absolute: 0.1 10*3/uL (ref 0.0–0.7)
Eosinophils Relative: 0.5 % (ref 0.0–5.0)
HCT: 44 % (ref 39.0–52.0)
Hemoglobin: 14.4 g/dL (ref 13.0–17.0)
Lymphocytes Relative: 13 % (ref 12.0–46.0)
Lymphs Abs: 2.4 10*3/uL (ref 0.7–4.0)
MCHC: 32.7 g/dL (ref 30.0–36.0)
MCV: 87 fl (ref 78.0–100.0)
Monocytes Absolute: 1.9 10*3/uL — ABNORMAL HIGH (ref 0.1–1.0)
Monocytes Relative: 10.1 % (ref 3.0–12.0)
Neutro Abs: 14.1 10*3/uL — ABNORMAL HIGH (ref 1.4–7.7)
Neutrophils Relative %: 75.5 % (ref 43.0–77.0)
Platelets: 229 10*3/uL (ref 150.0–400.0)
RBC: 5.05 Mil/uL (ref 4.22–5.81)
RDW: 13.9 % (ref 11.5–15.5)
WBC: 18.6 10*3/uL (ref 4.0–10.5)

## 2022-05-27 LAB — COMPREHENSIVE METABOLIC PANEL
ALT: 13 U/L (ref 0–53)
AST: 15 U/L (ref 0–37)
Albumin: 4.1 g/dL (ref 3.5–5.2)
Alkaline Phosphatase: 74 U/L (ref 39–117)
BUN: 21 mg/dL (ref 6–23)
CO2: 23 mEq/L (ref 19–32)
Calcium: 9.2 mg/dL (ref 8.4–10.5)
Chloride: 102 mEq/L (ref 96–112)
Creatinine, Ser: 1.04 mg/dL (ref 0.40–1.50)
GFR: 74.11 mL/min (ref >60.00)
Glucose, Bld: 72 mg/dL (ref 70–99)
Potassium: 4.3 mEq/L (ref 3.5–5.1)
Sodium: 136 mEq/L (ref 135–145)
Total Bilirubin: 0.6 mg/dL (ref 0.2–1.2)
Total Protein: 7.1 g/dL (ref 6.0–8.3)

## 2022-05-27 LAB — PSA: PSA: 9.28 ng/mL — ABNORMAL HIGH (ref 0.10–4.00)

## 2022-05-27 NOTE — Addendum Note (Signed)
Addended by: Gwenevere Abbot on: 05/27/2022 12:46 PM   Modules accepted: Orders

## 2022-05-27 NOTE — Telephone Encounter (Signed)
CRITICAL VALUE STICKER  CRITICAL VALUE:  WBC -- 18.6  RECEIVER (on-site recipient of call): Mervin Kung, CMA  DATE & TIME NOTIFIED: 05/27/22 @ 11:40  MESSENGER (representative from lab): Judeth Cornfield  MD NOTIFIED: Esperanza Richters, PA-C  TIME OF NOTIFICATION: 11:40am  RESPONSE:

## 2022-05-28 ENCOUNTER — Other Ambulatory Visit (INDEPENDENT_AMBULATORY_CARE_PROVIDER_SITE_OTHER): Payer: Medicare Other

## 2022-05-28 DIAGNOSIS — D72829 Elevated white blood cell count, unspecified: Secondary | ICD-10-CM

## 2022-05-28 LAB — CBC WITH DIFFERENTIAL/PLATELET
Basophils Absolute: 0 10*3/uL (ref 0.0–0.1)
Basophils Relative: 0.4 % (ref 0.0–3.0)
Eosinophils Absolute: 0.2 10*3/uL (ref 0.0–0.7)
Eosinophils Relative: 3.5 % (ref 0.0–5.0)
HCT: 40.8 % (ref 39.0–52.0)
Hemoglobin: 13.6 g/dL (ref 13.0–17.0)
Lymphocytes Relative: 27 % (ref 12.0–46.0)
Lymphs Abs: 1.9 10*3/uL (ref 0.7–4.0)
MCHC: 33.3 g/dL (ref 30.0–36.0)
MCV: 86.2 fl (ref 78.0–100.0)
Monocytes Absolute: 0.5 10*3/uL (ref 0.1–1.0)
Monocytes Relative: 7.6 % (ref 3.0–12.0)
Neutro Abs: 4.3 10*3/uL (ref 1.4–7.7)
Neutrophils Relative %: 61.5 % (ref 43.0–77.0)
Platelets: 276 10*3/uL (ref 150.0–400.0)
RBC: 4.74 Mil/uL (ref 4.22–5.81)
RDW: 13.5 % (ref 11.5–15.5)
WBC: 7.1 10*3/uL (ref 4.0–10.5)

## 2022-05-28 MED ORDER — CIPROFLOXACIN HCL 500 MG PO TABS: 500.0000 mg | ORAL_TABLET | Freq: Two times a day (BID) | ORAL | 0 refills | Status: DC

## 2022-05-28 NOTE — Addendum Note (Signed)
Addended by: Gwenevere Abbot on: 05/28/2022 05:06 PM   Modules accepted: Orders

## 2022-05-29 LAB — URINE CULTURE
MICRO NUMBER:: 13846812
SPECIMEN QUALITY:: ADEQUATE

## 2022-06-02 ENCOUNTER — Ambulatory Visit (INDEPENDENT_AMBULATORY_CARE_PROVIDER_SITE_OTHER): Payer: Medicare Other | Admitting: *Deleted

## 2022-06-02 ENCOUNTER — Ambulatory Visit: Payer: Medicare Other

## 2022-06-02 VITALS — BP 134/77 | HR 67 | Ht 67.0 in | Wt 173.6 lb

## 2022-06-02 DIAGNOSIS — Z0000 Encounter for general adult medical examination without abnormal findings: Secondary | ICD-10-CM

## 2022-06-02 NOTE — Patient Instructions (Signed)
Matthew Lee , Thank you for taking time to come for your Medicare Wellness Visit. I appreciate your ongoing commitment to your health goals. Please review the following plan we discussed and let me know if I can assist you in the future.   These are the goals we discussed:  Goals      Patient Stated     Increase activity        This is a list of the screening recommended for you and due dates:  Health Maintenance  Topic Date Due   Colon Cancer Screening  Never done   COVID-19 Vaccine (3 - Moderna series) 02/21/2020   Flu Shot  04/28/2022   Tetanus Vaccine  03/16/2023   Pneumonia Vaccine  Completed   Hepatitis C Screening: USPSTF Recommendation to screen - Ages 18-79 yo.  Completed   Zoster (Shingles) Vaccine  Completed   HPV Vaccine  Aged Out       Next appointment: Follow up in one year for your annual wellness visit.   Preventive Care 38 Years and Older, Male Preventive care refers to lifestyle choices and visits with your health care provider that can promote health and wellness. What does preventive care include? A yearly physical exam. This is also called an annual well check. Dental exams once or twice a year. Routine eye exams. Ask your health care provider how often you should have your eyes checked. Personal lifestyle choices, including: Daily care of your teeth and gums. Regular physical activity. Eating a healthy diet. Avoiding tobacco and drug use. Limiting alcohol use. Practicing safe sex. Taking low doses of aspirin every day. Taking vitamin and mineral supplements as recommended by your health care provider. What happens during an annual well check? The services and screenings done by your health care provider during your annual well check will depend on your age, overall health, lifestyle risk factors, and family history of disease. Counseling  Your health care provider may ask you questions about your: Alcohol use. Tobacco use. Drug use. Emotional  well-being. Home and relationship well-being. Sexual activity. Eating habits. History of falls. Memory and ability to understand (cognition). Work and work Astronomer. Screening  You may have the following tests or measurements: Height, weight, and BMI. Blood pressure. Lipid and cholesterol levels. These may be checked every 5 years, or more frequently if you are over 58 years old. Skin check. Lung cancer screening. You may have this screening every year starting at age 71 if you have a 30-pack-year history of smoking and currently smoke or have quit within the past 15 years. Fecal occult blood test (FOBT) of the stool. You may have this test every year starting at age 40. Flexible sigmoidoscopy or colonoscopy. You may have a sigmoidoscopy every 5 years or a colonoscopy every 10 years starting at age 22. Prostate cancer screening. Recommendations will vary depending on your family history and other risks. Hepatitis C blood test. Hepatitis B blood test. Sexually transmitted disease (STD) testing. Diabetes screening. This is done by checking your blood sugar (glucose) after you have not eaten for a while (fasting). You may have this done every 1-3 years. Abdominal aortic aneurysm (AAA) screening. You may need this if you are a current or former smoker. Osteoporosis. You may be screened starting at age 17 if you are at high risk. Talk with your health care provider about your test results, treatment options, and if necessary, the need for more tests. Vaccines  Your health care provider may recommend certain vaccines,  such as: Influenza vaccine. This is recommended every year. Tetanus, diphtheria, and acellular pertussis (Tdap, Td) vaccine. You may need a Td booster every 10 years. Zoster vaccine. You may need this after age 84. Pneumococcal 13-valent conjugate (PCV13) vaccine. One dose is recommended after age 2. Pneumococcal polysaccharide (PPSV23) vaccine. One dose is recommended after  age 68. Talk to your health care provider about which screenings and vaccines you need and how often you need them. This information is not intended to replace advice given to you by your health care provider. Make sure you discuss any questions you have with your health care provider. Document Released: 10/11/2015 Document Revised: 06/03/2016 Document Reviewed: 07/16/2015 Elsevier Interactive Patient Education  2017 [employer].  Fall Prevention in the Home Falls can cause injuries. They can happen to people of all ages. There are many things you can do to make your home safe and to help prevent falls. What can I do on the outside of my home? Regularly fix the edges of walkways and driveways and fix any cracks. Remove anything that might make you trip as you walk through a door, such as a raised step or threshold. Trim any bushes or trees on the path to your home. Use bright outdoor lighting. Clear any walking paths of anything that might make someone trip, such as rocks or tools. Regularly check to see if handrails are loose or broken. Make sure that both sides of any steps have handrails. Any raised decks and porches should have guardrails on the edges. Have any leaves, snow, or ice cleared regularly. Use sand or salt on walking paths during winter. Clean up any spills in your garage right away. This includes oil or grease spills. What can I do in the bathroom? Use night lights. Install grab bars by the toilet and in the tub and shower. Do not use towel bars as grab bars. Use non-skid mats or decals in the tub or shower. If you need to sit down in the shower, use a plastic, non-slip stool. Keep the floor dry. Clean up any water that spills on the floor as soon as it happens. Remove soap buildup in the tub or shower regularly. Attach bath mats securely with double-sided non-slip rug tape. Do not have throw rugs and other things on the floor that can make you trip. What can I do in the  bedroom? Use night lights. Make sure that you have a light by your bed that is easy to reach. Do not use any sheets or blankets that are too big for your bed. They should not hang down onto the floor. Have a firm chair that has side arms. You can use this for support while you get dressed. Do not have throw rugs and other things on the floor that can make you trip. What can I do in the kitchen? Clean up any spills right away. Avoid walking on wet floors. Keep items that you use a lot in easy-to-reach places. If you need to reach something above you, use a strong step stool that has a grab bar. Keep electrical cords out of the way. Do not use floor polish or wax that makes floors slippery. If you must use wax, use non-skid floor wax. Do not have throw rugs and other things on the floor that can make you trip. What can I do with my stairs? Do not leave any items on the stairs. Make sure that there are handrails on both sides of the stairs and  use them. Fix handrails that are broken or loose. Make sure that handrails are as long as the stairways. Check any carpeting to make sure that it is firmly attached to the stairs. Fix any carpet that is loose or worn. Avoid having throw rugs at the top or bottom of the stairs. If you do have throw rugs, attach them to the floor with carpet tape. Make sure that you have a light switch at the top of the stairs and the bottom of the stairs. If you do not have them, ask someone to add them for you. What else can I do to help prevent falls? Wear shoes that: Do not have high heels. Have rubber bottoms. Are comfortable and fit you well. Are closed at the toe. Do not wear sandals. If you use a stepladder: Make sure that it is fully opened. Do not climb a closed stepladder. Make sure that both sides of the stepladder are locked into place. Ask someone to hold it for you, if possible. Clearly mark and make sure that you can see: Any grab bars or  handrails. First and last steps. Where the edge of each step is. Use tools that help you move around (mobility aids) if they are needed. These include: Canes. Walkers. Scooters. Crutches. Turn on the lights when you go into a dark area. Replace any light bulbs as soon as they burn out. Set up your furniture so you have a clear path. Avoid moving your furniture around. If any of your floors are uneven, fix them. If there are any pets around you, be aware of where they are. Review your medicines with your doctor. Some medicines can make you feel dizzy. This can increase your chance of falling. Ask your doctor what other things that you can do to help prevent falls. This information is not intended to replace advice given to you by your health care provider. Make sure you discuss any questions you have with your health care provider. Document Released: 07/11/2009 Document Revised: 02/20/2016 Document Reviewed: 10/19/2014 Elsevier Interactive Patient Education  2017 [employer].

## 2022-06-02 NOTE — Progress Notes (Signed)
Subjective:   Matthew Lee is a 66 y.o. male who presents for Medicare Annual/Subsequent preventive examination.   Review of Systems    Defer to PCP Cardiac Risk Factors include: advanced age (>41men, >50 women);hypertension;male gender     Objective:    Today's Vitals   06/02/22 1359 06/02/22 1410  BP: (!) 150/74 134/77  Pulse: 78 67  Weight: 173 lb 9.6 oz (78.7 kg)   Height: 5\' 7"  (1.702 m)    Body mass index is 27.19 kg/m.     06/02/2022    2:01 PM 05/29/2021    9:06 AM 08/21/2011   11:39 AM  Advanced Directives  Does Patient Have a Medical Advance Directive? No No Patient does not have advance directive  Would patient like information on creating a medical advance directive? No - Patient declined Yes (MAU/Ambulatory/Procedural Areas - Information given)   Pre-existing out of facility DNR order (yellow form or pink MOST form)   No    Current Medications (verified) Outpatient Encounter Medications as of 06/02/2022  Medication Sig   amLODipine (NORVASC) 10 MG tablet TAKE 1 TABLET DAILY   ciprofloxacin (CIPRO) 500 MG tablet Take 1 tablet (500 mg total) by mouth 2 (two) times daily.   ciprofloxacin (CIPRO) 500 MG tablet Take 1 tablet (500 mg total) by mouth 2 (two) times daily.   labetalol (NORMODYNE) 100 MG tablet TAKE ONE AND ONE-HALF TABLETS TWICE A DAY   losartan (COZAAR) 50 MG tablet Take 1 tablet (50 mg total) by mouth daily.   sulfamethoxazole-trimethoprim (BACTRIM DS) 800-160 MG tablet Take 1 tablet by mouth 2 (two) times daily.   No facility-administered encounter medications on file as of 06/02/2022.    Allergies (verified) Patient has no known allergies.   History: Past Medical History:  Diagnosis Date   Diverticulitis    Headache(784.0)    Hepatitis C 08/24/2012   TX and cured per pt   History of tobacco abuse    Hypertension    long standing since 2007, was treated for sometime, but then discontinued taking meds on his own.   Pulmonary edema, acute  (HCC)    hospitalized in 09/2009 with hypertensive crisis and acute pulmonary edema- improved with lasix nad labetalol.   Past Surgical History:  Procedure Laterality Date   ESOPHAGOGASTRODUODENOSCOPY  08/21/2011   Procedure: ESOPHAGOGASTRODUODENOSCOPY (EGD);  Surgeon: 08/23/2011, MD;  Location: [facility];  Service: Endoscopy;  Laterality: N/A;   TONSILLECTOMY     Family History  Problem Relation Age of Onset   Polycystic kidney disease Father    Polycystic kidney disease Half-Brother    Polycystic kidney disease Half-Brother    Polycystic kidney disease Half-Sister    Colon cancer Neg Hx    Esophageal cancer Neg Hx    Stomach cancer Neg Hx    Social History   Socioeconomic History   Marital status: Married    Spouse name: Not on file   Number of children: Not on file   Years of education: Not on file   Highest education level: Not on file  Occupational History   Occupation: Works in [employer]- for last 25 years.   Occupation: Transport planner: [employer]  Tobacco Use   Smoking status: Former    Packs/day: 1.00    Years: 40.00    Total pack years: 40.00    Types: Cigarettes, E-cigarettes    Quit date: 09/23/2009    Years since quitting: 12.6   Smokeless tobacco: Never  Vaping Use   Vaping Use: Never used  Substance and Sexual Activity   Alcohol use: Not Currently   Drug use: No   Sexual activity: Not on file  Other Topics Concern   Not on file  Social History Narrative   Lives at home with wife, daughter and her 2 children.   Caffeine use:  1 daily   Works a Aeronautical engineer (Museum/gallery conservator)   Enjoys Optician, dispensing, Civil engineer, contracting.         Social Determinants of Health   Financial Resource Strain: Low Risk  (05/29/2021)   Overall Financial Resource Strain (CARDIA)    Difficulty of Paying Living Expenses: Not hard at all  Food Insecurity: No Food Insecurity (05/29/2021)   Hunger Vital Sign    Worried About Running Out of Food  in the Last Year: Never true    Ran Out of Food in the Last Year: Never true  Transportation Needs: No Transportation Needs (05/29/2021)   PRAPARE - Administrator, Civil Service (Medical): No    Lack of Transportation (Non-Medical): No  Physical Activity: Inactive (05/29/2021)   Exercise Vital Sign    Days of Exercise per Week: 0 days    Minutes of Exercise per Session: 0 min  Stress: No Stress Concern Present (05/29/2021)   [employer] of Occupational Health - Occupational Stress Questionnaire    Feeling of Stress : Only a little  Social Connections: Moderately Isolated (05/29/2021)   Social Connection and Isolation Panel [NHANES]    Frequency of Communication with Friends and Family: More than three times a week    Frequency of Social Gatherings with Friends and Family: More than three times a week    Attends Religious Services: Never    Database administrator or Organizations: No    Attends Engineer, structural: Never    Marital Status: Married    Tobacco Counseling Counseling given: Not Answered   Clinical Intake:  Pre-visit preparation completed: Yes  Pain : No/denies pain     Diabetes: No  How often do you need to have someone help you when you read instructions, pamphlets, or other written materials from your doctor or pharmacy?: 1 - Never  Diabetic? No  Interpreter Needed?: No  Information entered by :: Donne Anon, CMA   Activities of Daily Living    06/02/2022    2:06 PM  In your present state of health, do you have any difficulty performing the following activities:  Hearing? 0  Vision? 0  Difficulty concentrating or making decisions? 0  Walking or climbing stairs? 0  Dressing or bathing? 0  Doing errands, shopping? 0  Preparing Food and eating ? N  Using the Toilet? N  In the past six months, have you accidently leaked urine? N  Do you have problems with loss of bowel control? N  Managing your Medications? N  Managing your  Finances? N  Housekeeping or managing your Housekeeping? N    Patient Care Team: Sandford Craze, NP as PCP - General (Internal Medicine)  Indicate any recent Medical Services you may have received from other than [facility] providers in the past year (date may be approximate).     Assessment:   This is a routine wellness examination for Jeric.  Hearing/Vision screen No results found.  Dietary issues and exercise activities discussed: Current Exercise Habits: Home exercise routine, Type of exercise: walking, Time (Minutes): 30, Frequency (Times/Week): 7, Weekly Exercise (Minutes/Week): 210, Intensity: Mild, Exercise limited by:  None identified   Goals Addressed             This Visit's Progress    Patient Stated   On track    Increase activity       Depression Screen    06/02/2022    2:02 PM 09/30/2021    7:09 AM 08/26/2021    3:40 PM 05/29/2021    9:10 AM 04/09/2021    7:42 AM 11/15/2020    7:45 AM 01/18/2017   11:59 AM  PHQ 2/9 Scores  PHQ - 2 Score 0 0 0 0 0 0 0  PHQ- 9 Score       1    Fall Risk    06/02/2022    2:02 PM 09/30/2021    7:09 AM 08/26/2021    3:40 PM 05/29/2021    9:09 AM 04/09/2021    7:42 AM  Fall Risk   Falls in the past year? 0 0 0 0 0  Number falls in past yr: 0 0 0 0   Injury with Fall? 0 0 0 0   Risk for fall due to : No Fall Risks No Fall Risks No Fall Risks    Follow up Falls evaluation completed Falls evaluation completed Falls evaluation completed Falls prevention discussed     FALL RISK PREVENTION PERTAINING TO THE HOME:  Any stairs in or around the home? Yes  If so, are there any without handrails? No  Home free of loose throw rugs in walkways, pet beds, electrical cords, etc? Yes  Adequate lighting in your home to reduce risk of falls? Yes   ASSISTIVE DEVICES UTILIZED TO PREVENT FALLS:  Life alert? No  Use of a cane, walker or w/c? No  Grab bars in the bathroom? Yes  Shower chair or bench in shower? No  Elevated toilet seat or a  handicapped toilet? No     Cognitive Function:        06/02/2022    2:08 PM  6CIT Screen  What Year? 0 points  What month? 0 points  What time? 0 points  Count back from 20 0 points  Months in reverse 0 points  Repeat phrase 2 points  Total Score 2 points    Immunizations Immunization History  Administered Date(s) Administered   Fluad Quad(high Dose 65+) 06/28/2020, 09/30/2021   Hep A / Hep B 06/05/2013, 07/05/2013, 12/04/2013   Influenza Inj Mdck Quad Pf 07/06/2018   Influenza Whole 09/16/2009   Influenza,inj,Quad PF,6+ Mos 06/05/2013, 07/12/2014, 07/27/2015   Influenza-Unspecified 06/28/2012, 07/12/2014   Moderna Sars-Covid-2 Vaccination 11/10/2019, 12/27/2019   PNEUMOCOCCAL CONJUGATE-20 04/09/2021   Pneumococcal Polysaccharide-23 12/31/2014, 01/29/2020   Pneumococcal-Unspecified 08/28/2009   Tdap 03/15/2013   Zoster Recombinat (Shingrix) 03/15/2020, 05/15/2020    TDAP status: Up to date  Flu Vaccine status: Up to date  Pneumococcal vaccine status: Up to date  Covid-19 vaccine status: Information provided on how to obtain vaccines.   Qualifies for Shingles Vaccine? Yes   Zostavax completed No   Shingrix Completed?: Yes  Screening Tests Health Maintenance  Topic Date Due   COLONOSCOPY (Pts 45-19yrs Insurance coverage will need to be confirmed)  Never done   COVID-19 Vaccine (3 - Moderna series) 02/21/2020   INFLUENZA VACCINE  04/28/2022   TETANUS/TDAP  03/16/2023   Pneumonia Vaccine 46+ Years old  Completed   Hepatitis C Screening  Completed   Zoster Vaccines- Shingrix  Completed   HPV VACCINES  Aged Out    Health Maintenance  Health  Maintenance Due  Topic Date Due   COLONOSCOPY (Pts 45-71yrs Insurance coverage will need to be confirmed)  Never done   COVID-19 Vaccine (3 - Moderna series) 02/21/2020   INFLUENZA VACCINE  04/28/2022    Colon Cancer Screening: pt has not completed yet and does not want referral placed today.  Lung Cancer Screening:  (Low Dose CT Chest recommended if Age 76-80 years, 30 pack-year currently smoking OR have quit w/in 15years.) does not qualify.   Lung Cancer Screening Referral: N/a  Additional Screening:  Hepatitis C Screening: does qualify; Completed 08/13/15  Vision Screening: Recommended annual ophthalmology exams for early detection of glaucoma and other disorders of the eye. Is the patient up to date with their annual eye exam?  Yes  Who is the provider or what is the name of the office in which the patient attends annual eye exams? [facility] If pt is not established with a provider, would they like to be referred to a provider to establish care? No .   Dental Screening: Recommended annual dental exams for proper oral hygiene  Community Resource Referral / Chronic Care Management: CRR required this visit?  No   CCM required this visit?  No      Plan:     I have personally reviewed and noted the following in the patient's chart:   Medical and social history Use of alcohol, tobacco or illicit drugs  Current medications and supplements including opioid prescriptions. Patient is not currently taking opioid prescriptions. Functional ability and status Nutritional status Physical activity Advanced directives List of other physicians Hospitalizations, surgeries, and ER visits in previous 12 months Vitals Screenings to include cognitive, depression, and falls Referrals and appointments  In addition, I have reviewed and discussed with patient certain preventive protocols, quality metrics, and best practice recommendations. A written personalized care plan for preventive services as well as general preventive health recommendations were provided to patient.     Donne Anon, [place]   06/02/2022   Nurse Notes: None

## 2022-06-17 ENCOUNTER — Other Ambulatory Visit (INDEPENDENT_AMBULATORY_CARE_PROVIDER_SITE_OTHER): Payer: Medicare Other

## 2022-06-17 DIAGNOSIS — R972 Elevated prostate specific antigen [PSA]: Secondary | ICD-10-CM | POA: Diagnosis not present

## 2022-06-17 LAB — PSA: PSA: 4.46 ng/mL — ABNORMAL HIGH (ref 0.10–4.00)

## 2022-06-17 NOTE — Addendum Note (Signed)
Addended by: Anabel Halon on: 06/17/2022 07:20 PM   Modules accepted: Orders

## 2022-06-19 ENCOUNTER — Telehealth: Payer: Medicare Other | Admitting: Physician Assistant

## 2022-06-19 DIAGNOSIS — Z20822 Contact with and (suspected) exposure to covid-19: Secondary | ICD-10-CM

## 2022-06-19 MED ORDER — NIRMATRELVIR/RITONAVIR (PAXLOVID)TABLET: 3.0000 | ORAL_TABLET | Freq: Two times a day (BID) | ORAL | 0 refills | Status: AC

## 2022-06-19 NOTE — Progress Notes (Signed)
Virtual Visit Consent   Angad Roesel Swoveland, you are scheduled for a virtual visit with a [facility] provider today. Just as with appointments in the office, your consent must be obtained to participate. Your consent will be active for this visit and any virtual visit you may have with one of our providers in the next 365 days. If you have a MyChart account, a copy of this consent can be sent to you electronically.  As this is a virtual visit, video technology does not allow for your provider to perform a traditional examination. This may limit your provider's ability to fully assess your condition. If your provider identifies any concerns that need to be evaluated in person or the need to arrange testing (such as labs, EKG, etc.), we will make arrangements to do so. Although advances in technology are sophisticated, we cannot ensure that it will always work on either your end or our end. If the connection with a video visit is poor, the visit may have to be switched to a telephone visit. With either a video or telephone visit, we are not always able to ensure that we have a secure connection.  By engaging in this virtual visit, you consent to the provision of healthcare and authorize for your insurance to be billed (if applicable) for the services provided during this visit. Depending on your insurance coverage, you may receive a charge related to this service.  I need to obtain your verbal consent now. Are you willing to proceed with your visit today? Lukus Neuhoff Deyton has provided verbal consent on 06/19/2022 for a virtual visit (video or telephone). Mar Daring, PA-C  Date: 06/19/2022 4:45 PM  Virtual Visit via Video Note   I, Mar Daring, connected with  Abbas Taboada Blatchford  ([id number], 06-02-1956) on 06/19/22 at  4:30 PM EDT by a video-enabled telemedicine application and verified that I am speaking with the correct person using two identifiers.  Location: Patient: Virtual Visit Location Patient:  Home Provider: Virtual Visit Location Provider: Home Office   I discussed the limitations of evaluation and management by telemedicine and the availability of in person appointments. The patient expressed understanding and agreed to proceed.    History of Present Illness: Matthew Lee is a 66 y.o. who identifies as a male who was assigned male at birth, and is being seen today for suspected Covid 62.  HPI: URI  This is a new problem. The current episode started today (exposed to Covid 19 from grandson (he takes him to work) and now he is having symptoms). The problem has been gradually worsening. There has been no fever. Associated symptoms include congestion, coughing (mild throat clearing), headaches, nausea and a sore throat (scratchy). Pertinent negatives include no diarrhea, ear pain, plugged ear sensation, rhinorrhea, sinus pain or vomiting. Associated symptoms comments: Flush face, myalgias. He has tried nothing for the symptoms. The treatment provided no relief.      Problems:  Patient Active Problem List   Diagnosis Date Noted   Immunization counseling 04/01/2022   Gall bladder polyp 02/18/2015   Preventative health care 12/31/2014   Hyperglycemia 11/22/2014   GERD (gastroesophageal reflux disease) 09/07/2013   Hepatitis C [id number]   Mild diastolic dysfunction 0000000   Insomnia 10/31/2009   History of tobacco abuse 09/26/2009   Essential hypertension 09/26/2009    Allergies: No Known Allergies Medications:  Current Outpatient Medications:    nirmatrelvir/ritonavir EUA (PAXLOVID) 20 x 150 MG & 10 x 100MG  TABS, Take  3 tablets by mouth 2 (two) times daily for 5 days. (Take nirmatrelvir 150 mg two tablets twice daily for 5 days and ritonavir 100 mg one tablet twice daily for 5 days) Patient GFR is 74, Disp: 30 tablet, Rfl: 0   amLODipine (NORVASC) 10 MG tablet, TAKE 1 TABLET DAILY, Disp: 90 tablet, Rfl: 3   ciprofloxacin (CIPRO) 500 MG tablet, Take 1 tablet (500 mg total) by  mouth 2 (two) times daily., Disp: 14 tablet, Rfl: 0   ciprofloxacin (CIPRO) 500 MG tablet, Take 1 tablet (500 mg total) by mouth 2 (two) times daily., Disp: 6 tablet, Rfl: 0   labetalol (NORMODYNE) 100 MG tablet, TAKE ONE AND ONE-HALF TABLETS TWICE A DAY, Disp: 270 tablet, Rfl: 1   losartan (COZAAR) 50 MG tablet, Take 1 tablet (50 mg total) by mouth daily., Disp: 90 tablet, Rfl: 1   sulfamethoxazole-trimethoprim (BACTRIM DS) 800-160 MG tablet, Take 1 tablet by mouth 2 (two) times daily., Disp: 14 tablet, Rfl: 0  Observations/Objective: Patient is well-developed, well-nourished in no acute distress.  Resting comfortably at home.  Head is normocephalic, atraumatic.  No labored breathing.  Speech is clear and coherent with logical content.  Patient is alert and oriented at baseline.    Assessment and Plan: 1. Suspected COVID-19 virus infection - nirmatrelvir/ritonavir EUA (PAXLOVID) 20 x 150 MG & 10 x 100MG  TABS; Take 3 tablets by mouth 2 (two) times daily for 5 days. (Take nirmatrelvir 150 mg two tablets twice daily for 5 days and ritonavir 100 mg one tablet twice daily for 5 days) Patient GFR is 74  Dispense: 30 tablet; Refill: 0  - Continue OTC symptomatic management of choice - Will send OTC vitamins and supplement information through AVS - Paxlovid prescribed - Patient enrolled in MyChart symptom monitoring - Push fluids - Rest as needed - Discussed return precautions and when to seek in-person evaluation, sent via AVS as well   Follow Up Instructions: I discussed the assessment and treatment plan with the patient. The patient was provided an opportunity to ask questions and all were answered. The patient agreed with the plan and demonstrated an understanding of the instructions.  A copy of instructions were sent to the patient via MyChart unless otherwise noted below.    The patient was advised to call back or seek an in-person evaluation if the symptoms worsen or if the condition  fails to improve as anticipated.  Time:  I spent 15 minutes with the patient via telehealth technology discussing the above problems/concerns.    Mar Daring, PA-C

## 2022-06-19 NOTE — Patient Instructions (Signed)
Matthew Lee, thank you for joining Mar Daring, PA-C for today's virtual visit.  While this provider is not your primary care provider (PCP), if your PCP is located in our provider database this encounter information will be shared with them immediately following your visit.  Consent: (Patient) My Madariaga Haaland provided verbal consent for this virtual visit at the beginning of the encounter.  Current Medications:  Current Outpatient Medications:    nirmatrelvir/ritonavir EUA (PAXLOVID) 20 x 150 MG & 10 x 100MG  TABS, Take 3 tablets by mouth 2 (two) times daily for 5 days. (Take nirmatrelvir 150 mg two tablets twice daily for 5 days and ritonavir 100 mg one tablet twice daily for 5 days) Patient GFR is 74, Disp: 30 tablet, Rfl: 0   amLODipine (NORVASC) 10 MG tablet, TAKE 1 TABLET DAILY, Disp: 90 tablet, Rfl: 3   ciprofloxacin (CIPRO) 500 MG tablet, Take 1 tablet (500 mg total) by mouth 2 (two) times daily., Disp: 14 tablet, Rfl: 0   ciprofloxacin (CIPRO) 500 MG tablet, Take 1 tablet (500 mg total) by mouth 2 (two) times daily., Disp: 6 tablet, Rfl: 0   labetalol (NORMODYNE) 100 MG tablet, TAKE ONE AND ONE-HALF TABLETS TWICE A DAY, Disp: 270 tablet, Rfl: 1   losartan (COZAAR) 50 MG tablet, Take 1 tablet (50 mg total) by mouth daily., Disp: 90 tablet, Rfl: 1   sulfamethoxazole-trimethoprim (BACTRIM DS) 800-160 MG tablet, Take 1 tablet by mouth 2 (two) times daily., Disp: 14 tablet, Rfl: 0   Medications ordered in this encounter:  Meds ordered this encounter  Medications   nirmatrelvir/ritonavir EUA (PAXLOVID) 20 x 150 MG & 10 x 100MG  TABS    Sig: Take 3 tablets by mouth 2 (two) times daily for 5 days. (Take nirmatrelvir 150 mg two tablets twice daily for 5 days and ritonavir 100 mg one tablet twice daily for 5 days) Patient GFR is 74    Dispense:  30 tablet    Refill:  0    Order Specific Question:   Supervising Provider    Answer:   Chase Picket [id number]     *If you need refills on  other medications prior to your next appointment, please contact your pharmacy*  Follow-Up: Call back or seek an in-person evaluation if the symptoms worsen or if the condition fails to improve as anticipated.  [facility] [phone]  Other Instructions  Quarantine and Isolation Quarantine and isolation refer to local and travel restrictions to protect the public and travelers from contagious diseases that constitute a public health threat. Contagious diseases are diseases that can spread from one person to another. Quarantine and isolation help to protect the public by preventing exposure to people who have or may have a contagious disease. Isolation separates people who are sick with a contagious disease from people who are not sick. Quarantine separates and restricts the movement of people who were exposed to a contagious disease to see if they become sick. You may be put in quarantine or isolation if you have been exposed to or diagnosed with any of the following diseases: Severe acute respiratory syndromes, such as COVID-19. Cholera. Diphtheria. Tuberculosis. Plague. Smallpox. Yellow fever. Viral hemorrhagic fevers, such as [place], Ebola, and [place]. When to quarantine or isolate Follow these rules, whether you have been vaccinated or not: Stay home and isolate from others when you are sick with a contagious disease. Isolate when you test positive for a contagious disease, even if you do not  have symptoms. Isolate if you are sick and suspect that you may have a contagious disease. If you suspect that you have a contagious disease, get tested. If your test results are negative, you can end your isolation. If your test results are positive, follow the full isolation recommendations as told by your health care provider or local health authorities. Quarantine and stay away from others when you have been in close contact with someone who has tested positive for  a contagious disease. Close contact is defined as being less than 6 ft (1.8 m) away from an infected person for a total of 15 minutes or more over a 24-hour period. Do not go to places where you are unable to wear a mask, such as restaurants and some gyms. Stay home and separate from others as much as possible. Avoid being around people who may get very sick from the contagious disease that you have. Use a separate bathroom, if possible. Do not travel. For travel guidance, visit the CDC's travel webpage at SwankBlog.no Follow these instructions at home: Medicines  Take over-the-counter and prescription medicines as told by your health care provider. Finish all antibiotic medicine even when you start to feel better. Stay up to date with all your vaccines. Get scheduled vaccines and boosters as recommended by your health care provider. Lifestyle Wear a high-quality mask if you must be around others at home and in public, if recommended. Improve air flow (ventilation) at home to help prevent the disease from spreading to other people, if possible. Do not share personal household items, like cups, towels, and utensils. Practice everyday hygiene and cleaning. General instructions Talk to your health care provider if you have a weakened body defense system (immune system). People with a weakened immune system may have a reduced immune response to vaccines. You may need to follow current prevention measures, including wearing a well-fitting mask, avoiding crowds, and avoiding poorly ventilated indoor places. Monitor symptoms and follow health care provider instructions, which may include resting, drinking fluids, and taking medicines. Follow specific isolation and quarantine recommendations if you are in places that can lead to disease outbreaks, such as correctional and detention facilities, homeless shelters, and cruise ships. Return to your normal activities as told by your health care  provider. Ask your health care provider what activities are safe for you. Keep all follow-up visits. This is important. Where to find more information CDC: [place].hu Contact a health care provider if: You have a fever. You have signs and symptoms that return or get worse after isolation. Get help right away if: You have difficulty breathing. You have chest pain. These symptoms may be an emergency. Get help right away. Call 911. Do not wait to see if the symptoms will go away. Do not drive yourself to the hospital. Summary Isolation and quarantine help protect the public by preventing exposure to people who have or may have a contagious disease. Isolate when you are sick or when you test positive, even if you do not have symptoms. Quarantine and stay away from others when you have been in close contact with someone who has tested positive for a contagious disease. This information is not intended to replace advice given to you by your health care provider. Make sure you discuss any questions you have with your health care provider. Document Revised: 09/25/2021 Document Reviewed: 09/04/2021 Elsevier Patient Education  [place].    If you have been instructed to have an in-person evaluation today at a local  Urgent Care facility, please use the link below. It will take you to a list of all of our available [facility], including address, phone number and hours of operation. Please do not delay care.  [facility]  If you or a family member do not have a primary care provider, use the link below to schedule a visit and establish care. When you choose a [facility] primary care physician or advanced practice provider, you gain a long-term partner in health. Find a Primary Care Provider  Learn more about [facility]'s in-office and virtual care options: [facility]

## 2022-06-30 ENCOUNTER — Telehealth: Payer: Self-pay | Admitting: Family

## 2022-06-30 NOTE — Telephone Encounter (Signed)
See mychart.  

## 2022-07-31 ENCOUNTER — Encounter: Payer: Self-pay | Admitting: Family

## 2022-07-31 ENCOUNTER — Ambulatory Visit (INDEPENDENT_AMBULATORY_CARE_PROVIDER_SITE_OTHER): Payer: Medicare Other | Admitting: Family

## 2022-07-31 VITALS — BP 164/90 | HR 72 | Resp 18 | Ht 67.0 in | Wt 174.0 lb

## 2022-07-31 DIAGNOSIS — Z8616 Personal history of COVID-19: Secondary | ICD-10-CM

## 2022-07-31 DIAGNOSIS — Z87891 Personal history of nicotine dependence: Secondary | ICD-10-CM | POA: Diagnosis not present

## 2022-07-31 DIAGNOSIS — R972 Elevated prostate specific antigen [PSA]: Secondary | ICD-10-CM

## 2022-07-31 DIAGNOSIS — Z23 Encounter for immunization: Secondary | ICD-10-CM | POA: Diagnosis not present

## 2022-07-31 DIAGNOSIS — Z1211 Encounter for screening for malignant neoplasm of colon: Secondary | ICD-10-CM

## 2022-07-31 DIAGNOSIS — R739 Hyperglycemia, unspecified: Secondary | ICD-10-CM | POA: Diagnosis not present

## 2022-07-31 DIAGNOSIS — I10 Essential (primary) hypertension: Secondary | ICD-10-CM | POA: Diagnosis not present

## 2022-07-31 HISTORY — DX: Personal history of COVID-19: Z86.16

## 2022-07-31 HISTORY — DX: Encounter for screening for malignant neoplasm of colon: Z12.11

## 2022-07-31 HISTORY — DX: Elevated prostate specific antigen (PSA): R97.20

## 2022-07-31 LAB — BASIC METABOLIC PANEL
BUN: 17 mg/dL (ref 6–23)
CO2: 29 mEq/L (ref 19–32)
Calcium: 9.3 mg/dL (ref 8.4–10.5)
Chloride: 102 mEq/L (ref 96–112)
Creatinine, Ser: 0.84 mg/dL (ref 0.40–1.50)
GFR: 89.89 mL/min (ref >60.00)
Glucose, Bld: 84 mg/dL (ref 70–99)
Potassium: 4.4 mEq/L (ref 3.5–5.1)
Sodium: 137 mEq/L (ref 135–145)

## 2022-07-31 LAB — PSA: PSA: 2.71 ng/mL (ref 0.10–4.00)

## 2022-07-31 LAB — HEMOGLOBIN A1C: Hgb A1c MFr Bld: 5.8 % (ref 4.6–6.5)

## 2022-07-31 NOTE — Assessment & Plan Note (Signed)
>>  ASSESSMENT AND PLAN FOR ESSENTIAL HYPERTENSION WRITTEN ON 07/31/2022  7:48 AM BY O'SULLIVAN, Reinhold Rickey, NP  BP Readings from Last 3 Encounters:  07/31/22 (!) 164/90  06/02/22 134/77  05/26/22 (!) 140/80   BP elevated today. Did not take AM meds.  Pt will return home to take meds and send me an updated bp reading later this afternoon.

## 2022-07-31 NOTE — Assessment & Plan Note (Signed)
Although he did not test for covid- it sounds like he did have it.  Advised pt that if he did have covid he should have 90 days of natural immunity and then he can get a booster at the pharmacy.

## 2022-07-31 NOTE — Assessment & Plan Note (Signed)
BP Readings from Last 3 Encounters:  07/31/22 (!) 164/90  06/02/22 134/77  05/26/22 (!) 140/80   BP elevated today. Did not take AM meds.  Pt will return home to take meds and send me an updated bp reading later this afternoon.

## 2022-07-31 NOTE — Assessment & Plan Note (Signed)
Declines to complete cologuard.  Offered to order colonoscopy.  Pt declines at this time but will let me know if he changes his mind.

## 2022-07-31 NOTE — Assessment & Plan Note (Signed)
Declines referral to Urology at this time, would like to repeat PSA. Advised pt that if PSA is still high, then Urology referral is very important to follow through with.

## 2022-07-31 NOTE — Progress Notes (Signed)
Subjective:   By signing my name below, I, Matthew Lee, attest that this documentation has been prepared under the direction and in the presence of [name], NP 07/31/2022   Patient ID: Matthew Lee, male    DOB: 1955-12-13, 66 y.o.   MRN: [id number]  Chief Complaint  Patient presents with   Follow-up    3 month    HPI Patient is in today for an office visit  Refill: He is not requesting a refill at this moment  Blood Pressure: His blood pressure during today's visit is elevated. He has not taken his medications prior to today's visit. He regularly checks his blood pressure and reports a reading of 134/81 BP Readings from Last 3 Encounters:  07/31/22 (!) 164/90  06/02/22 134/77  05/26/22 (!) 140/80   Pulse Readings from Last 3 Encounters:  07/31/22 72  06/02/22 67  05/26/22 84   Cologuard: He has not sent in a stool study yet. He states that he cannot bring himself to send one in.   Covid-19: He reports that he was exposed to family members who had Covid. He also reports that he did not have any major symptoms at the time of his exposure. He is inquiring about whether his older vaccination helped his symptoms.  Smoking: He reports that he discontinued smoking in 2010. He is interested in receiving a lung cancer screening.   PSA: He is requesting a PSA test to observe his progress.   Immunizations: He is UTD on his influenza vaccine. He has not received an updated Covid-19 vaccine.   Health Maintenance Due  Topic Date Due   COLONOSCOPY (Pts 45-55yrs Insurance coverage will need to be confirmed)  Never done   Lung Cancer Screening  Never done   COVID-19 Vaccine (3 - Moderna series) 02/21/2020   INFLUENZA VACCINE  04/28/2022    Past Medical History:  Diagnosis Date   Diverticulitis    Headache(784.0)    Hepatitis C 08/24/2012   TX and cured per pt   History of tobacco abuse    Hypertension    long standing since 2007, was treated for sometime, but  then discontinued taking meds on his own.   Pulmonary edema, acute (HCC)    hospitalized in 09/2009 with hypertensive crisis and acute pulmonary edema- improved with lasix nad labetalol.    Past Surgical History:  Procedure Laterality Date   ESOPHAGOGASTRODUODENOSCOPY  08/21/2011   Procedure: ESOPHAGOGASTRODUODENOSCOPY (EGD);  Surgeon: Charna Elizabeth, MD;  Location: [facility];  Service: Endoscopy;  Laterality: N/A;   TONSILLECTOMY      Family History  Problem Relation Age of Onset   Polycystic kidney disease Father    Polycystic kidney disease Half-Brother    Polycystic kidney disease Half-Brother    Polycystic kidney disease Half-Sister    Colon cancer Neg Hx    Esophageal cancer Neg Hx    Stomach cancer Neg Hx     Social History   Socioeconomic History   Marital status: Married    Spouse name: Not on file   Number of children: Not on file   Years of education: Not on file   Highest education level: Not on file  Occupational History   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   Occupation: Garment/textile technologist: [employer]  Tobacco Use   Smoking status: Former    Packs/day: 1.00    Years: 40.00    Total pack years: 40.00  Types: Cigarettes, E-cigarettes    Quit date: 09/23/2009    Years since quitting: 12.8   Smokeless tobacco: Never  Vaping Use   Vaping Use: Never used  Substance and Sexual Activity   Alcohol use: Not Currently   Drug use: No   Sexual activity: Not on file  Other Topics Concern   Not on file  Social History Narrative   Lives at home with wife, daughter and her 2 children.   Caffeine use:  1 daily   Works a Aeronautical engineer (Museum/gallery conservator)   Enjoys Optician, dispensing, Civil engineer, contracting.         Social Determinants of Health   Financial Resource Strain: Low Risk  (05/29/2021)   Overall Financial Resource Strain (CARDIA)    Difficulty of Paying Living Expenses: Not hard at all  Food Insecurity: No Food  Insecurity (05/29/2021)   Hunger Vital Sign    Worried About Running Out of Food in the Last Year: Never true    Ran Out of Food in the Last Year: Never true  Transportation Needs: No Transportation Needs (05/29/2021)   PRAPARE - Administrator, Civil Service (Medical): No    Lack of Transportation (Non-Medical): No  Physical Activity: Inactive (05/29/2021)   Exercise Vital Sign    Days of Exercise per Week: 0 days    Minutes of Exercise per Session: 0 min  Stress: No Stress Concern Present (05/29/2021)   [employer] of Occupational Health - Occupational Stress Questionnaire    Feeling of Stress : Only a little  Social Connections: Moderately Isolated (05/29/2021)   Social Connection and Isolation Panel [NHANES]    Frequency of Communication with Friends and Family: More than three times a week    Frequency of Social Gatherings with Friends and Family: More than three times a week    Attends Religious Services: Never    Database administrator or Organizations: No    Attends Banker Meetings: Never    Marital Status: Married  Catering manager Violence: Not At Risk (05/29/2021)   Humiliation, Afraid, Rape, and Kick questionnaire    Fear of Current or Ex-Partner: No    Emotionally Abused: No    Physically Abused: No    Sexually Abused: No    Outpatient Medications Prior to Visit  Medication Sig Dispense Refill   amLODipine (NORVASC) 10 MG tablet TAKE 1 TABLET DAILY 90 tablet 3   labetalol (NORMODYNE) 100 MG tablet TAKE ONE AND ONE-HALF TABLETS TWICE A DAY 270 tablet 1   losartan (COZAAR) 50 MG tablet Take 1 tablet (50 mg total) by mouth daily. 90 tablet 1   ciprofloxacin (CIPRO) 500 MG tablet Take 1 tablet (500 mg total) by mouth 2 (two) times daily. 14 tablet 0   ciprofloxacin (CIPRO) 500 MG tablet Take 1 tablet (500 mg total) by mouth 2 (two) times daily. 6 tablet 0   sulfamethoxazole-trimethoprim (BACTRIM DS) 800-160 MG tablet Take 1 tablet by mouth 2 (two)  times daily. 14 tablet 0   No facility-administered medications prior to visit.    No Known Allergies  ROS    See HPI Objective:    Physical Exam Constitutional:      General: He is not in acute distress.    Appearance: Normal appearance. He is not ill-appearing.  HENT:     Head: Normocephalic and atraumatic.     Right Ear: External ear normal.     Left Ear: External ear normal.  Eyes:  Extraocular Movements: Extraocular movements intact.     Pupils: Pupils are equal, round, and reactive to light.  Cardiovascular:     Rate and Rhythm: Normal rate and regular rhythm.     Heart sounds: Normal heart sounds. No murmur heard.    No gallop.  Pulmonary:     Effort: Pulmonary effort is normal. No respiratory distress.     Breath sounds: Normal breath sounds. No wheezing or rales.  Skin:    General: Skin is warm and dry.  Neurological:     Mental Status: He is alert and oriented to person, place, and time.  Psychiatric:        Mood and Affect: Mood normal.        Behavior: Behavior normal.        Judgment: Judgment normal.     BP (!) 164/90   Pulse 72   Resp 18   Ht 5\' 7"  (1.702 m)   Wt 174 lb (78.9 kg)   SpO2 100%   BMI 27.25 kg/m  Wt Readings from Last 3 Encounters:  07/31/22 174 lb (78.9 kg)  06/02/22 173 lb 9.6 oz (78.7 kg)  05/26/22 174 lb (78.9 kg)       Assessment & Plan:   Problem List Items Addressed This Visit       Unprioritized   Hyperglycemia - Primary    Wt Readings from Last 3 Encounters:  07/31/22 174 lb (78.9 kg)  06/02/22 173 lb 9.6 oz (78.7 kg)  05/26/22 174 lb (78.9 kg)  Check follow up A1C.       Relevant Orders   Hemoglobin A1c   Basic metabolic panel   History of tobacco abuse    Will obtain lung cancer screening CT.       Relevant Orders   CT CHEST LUNG CANCER SCREENING LOW DOSE WO CONTRAST   History of COVID-19    Although he did not test for covid- it sounds like he did have it.  Advised pt that if he did have covid  he should have 90 days of natural immunity and then he can get a booster at the pharmacy.       Essential hypertension    BP Readings from Last 3 Encounters:  07/31/22 (!) 164/90  06/02/22 134/77  05/26/22 (!) 140/80  BP elevated today. Did not take AM meds.  Pt will return home to take meds and send me an updated bp reading later this afternoon.        Elevated PSA    Declines referral to Urology at this time, would like to repeat PSA. Advised pt that if PSA is still high, then Urology referral is very important to follow through with.      Relevant Orders   PSA   Colon cancer screening    Declines to complete cologuard.  Offered to order colonoscopy.  Pt declines at this time but will let me know if he changes his mind.       Other Visit Diagnoses     Need for influenza vaccination       Relevant Orders   Flu Vaccine QUAD High Dose(Fluad)      No orders of the defined types were placed in this encounter.   I, 05/28/22, NP, personally preformed the services described in this documentation.  All medical record entries made by the scribe were at my direction and in my presence.  I have reviewed the chart and discharge instructions (if applicable) and  agree that the record reflects my personal performance and is accurate and complete. 07/31/2022   I,Amber Collins,acting as a scribe for Nance Pear, NP.,have documented all relevant documentation on the behalf of Nance Pear, NP,as directed by  Nance Pear, NP while in the presence of Nance Pear, NP.    Nance Pear, NP

## 2022-07-31 NOTE — Assessment & Plan Note (Signed)
Will obtain lung cancer screening CT.

## 2022-07-31 NOTE — Assessment & Plan Note (Signed)
Wt Readings from Last 3 Encounters:  07/31/22 174 lb (78.9 kg)  06/02/22 173 lb 9.6 oz (78.7 kg)  05/26/22 174 lb (78.9 kg)   Check follow up A1C.

## 2022-09-28 DIAGNOSIS — I712 Thoracic aortic aneurysm, without rupture, unspecified: Secondary | ICD-10-CM

## 2022-09-28 HISTORY — DX: Thoracic aortic aneurysm, without rupture, unspecified: I71.20

## 2022-09-30 ENCOUNTER — Telehealth: Payer: BLUE CROSS/BLUE SHIELD | Admitting: Nurse Practitioner

## 2022-09-30 DIAGNOSIS — U071 COVID-19: Secondary | ICD-10-CM | POA: Diagnosis not present

## 2022-09-30 DIAGNOSIS — M791 Myalgia, unspecified site: Secondary | ICD-10-CM | POA: Diagnosis not present

## 2022-09-30 DIAGNOSIS — R059 Cough, unspecified: Secondary | ICD-10-CM | POA: Diagnosis not present

## 2022-09-30 DIAGNOSIS — R5383 Other fatigue: Secondary | ICD-10-CM | POA: Diagnosis not present

## 2022-09-30 MED ORDER — NIRMATRELVIR/RITONAVIR (PAXLOVID)TABLET: 3.0000 | ORAL_TABLET | Freq: Two times a day (BID) | ORAL | 0 refills | Status: AC

## 2022-09-30 NOTE — Progress Notes (Signed)
Virtual Visit Consent   Matthew Lee, you are scheduled for a virtual visit with a [facility] provider today. Just as with appointments in the office, your consent must be obtained to participate. Your consent will be active for this visit and any virtual visit you may have with one of our providers in the next 365 days. If you have a MyChart account, a copy of this consent can be sent to you electronically.  As this is a virtual visit, video technology does not allow for your provider to perform a traditional examination. This may limit your provider's ability to fully assess your condition. If your provider identifies any concerns that need to be evaluated in person or the need to arrange testing (such as labs, EKG, etc.), we will make arrangements to do so. Although advances in technology are sophisticated, we cannot ensure that it will always work on either your end or our end. If the connection with a video visit is poor, the visit may have to be switched to a telephone visit. With either a video or telephone visit, we are not always able to ensure that we have a secure connection.  By engaging in this virtual visit, you consent to the provision of healthcare and authorize for your insurance to be billed (if applicable) for the services provided during this visit. Depending on your insurance coverage, you may receive a charge related to this service.  I need to obtain your verbal consent now. Are you willing to proceed with your visit today? Matthew Lee has provided verbal consent on 09/30/2022 for a virtual visit (video or telephone). Apolonio Schneiders, FNP  Date: 09/30/2022 11:32 AM  Virtual Visit via Video Note   I, Apolonio Schneiders, connected with  Matthew Lee  ([id number], Jun 17, 1956) on 09/30/22 at 11:30 AM EST by a video-enabled telemedicine application and verified that I am speaking with the correct person using two identifiers.  Location: Patient: Virtual Visit Location Patient: Home Provider:  Virtual Visit Location Provider: Home Office   I discussed the limitations of evaluation and management by telemedicine and the availability of in person appointments. The patient expressed understanding and agreed to proceed.    History of Present Illness: Matthew Lee is a 67 y.o. who identifies as a male who was assigned male at birth, and is being seen today for COVID.  He started to have symptoms 09/27/22 Had a negative test day prior- was testing due to exposure  Tested positive this morning for COVID   Symptoms today include: fatigue, muscle aches, cough, no fever  He has not had a diagnosed of COVID in the past  Has used Paxlovid in the past for suspected case without side effects He has been vaccinated x2 for COVID   History of HTN, no asthma or COPD  History of smoking quit in 2010  Problems:  Patient Active Problem List   Diagnosis Date Noted   Elevated PSA 07/31/2022   History of COVID-19 07/31/2022   Colon cancer screening 07/31/2022   Immunization counseling 04/01/2022   Gall bladder polyp 02/18/2015   Preventative health care 12/31/2014   Hyperglycemia 11/22/2014   GERD (gastroesophageal reflux disease) 09/07/2013   Hepatitis C 27/11/5007   Mild diastolic dysfunction 38/18/2993   Insomnia 10/31/2009   History of tobacco abuse 09/26/2009   Essential hypertension 09/26/2009    Allergies: No Known Allergies Medications:  Current Outpatient Medications:    amLODipine (NORVASC) 10 MG tablet, TAKE 1 TABLET DAILY, Disp: 90  tablet, Rfl: 3   labetalol (NORMODYNE) 100 MG tablet, TAKE ONE AND ONE-HALF TABLETS TWICE A DAY, Disp: 270 tablet, Rfl: 1   losartan (COZAAR) 50 MG tablet, Take 1 tablet (50 mg total) by mouth daily., Disp: 90 tablet, Rfl: 1  Observations/Objective: Patient is well-developed, well-nourished in no acute distress.  Resting comfortably  at home.  Head is normocephalic, atraumatic.  No labored breathing.  Speech is clear and coherent with  logical content.  Patient is alert and oriented at baseline.    Assessment and Plan: 1. COVID-19 Manage with over the counter Coricidin HBP OTC alternate tylenol and ibuprofen as needed   - nirmatrelvir/ritonavir (PAXLOVID) 20 x 150 MG & 10 x 100MG  TABS; Take 3 tablets by mouth 2 (two) times daily for 5 days. (Take nirmatrelvir 150 mg two tablets twice daily for 5 days and ritonavir 100 mg one tablet twice daily for 5 days) Patient GFR is 89  Dispense: 30 tablet; Refill: 0     Follow Up Instructions: I discussed the assessment and treatment plan with the patient. The patient was provided an opportunity to ask questions and all were answered. The patient agreed with the plan and demonstrated an understanding of the instructions.  A copy of instructions were sent to the patient via MyChart unless otherwise noted below.    The patient was advised to call back or seek an in-person evaluation if the symptoms worsen or if the condition fails to improve as anticipated.  Time:  I spent 10 minutes with the patient via telehealth technology discussing the above problems/concerns.    Apolonio Schneiders, FNP

## 2022-10-05 ENCOUNTER — Ambulatory Visit: Payer: Medicare Other | Admitting: Family

## 2022-10-06 ENCOUNTER — Other Ambulatory Visit: Payer: Self-pay | Admitting: Family

## 2022-10-06 ENCOUNTER — Encounter: Payer: Self-pay | Admitting: Family

## 2022-10-06 DIAGNOSIS — I10 Essential (primary) hypertension: Secondary | ICD-10-CM

## 2022-10-07 ENCOUNTER — Ambulatory Visit (HOSPITAL_BASED_OUTPATIENT_CLINIC_OR_DEPARTMENT_OTHER)
Admission: RE | Admit: 2022-10-07 | Discharge: 2022-10-07 | Disposition: A | Discharge status: Home / Self Care | Payer: Medicare Other | Source: Ambulatory Visit | Attending: Family | Admitting: Family

## 2022-10-07 DIAGNOSIS — Z87891 Personal history of nicotine dependence: Secondary | ICD-10-CM | POA: Diagnosis not present

## 2022-10-08 ENCOUNTER — Other Ambulatory Visit: Payer: Self-pay

## 2022-10-08 ENCOUNTER — Telehealth: Payer: Self-pay | Admitting: Family

## 2022-10-08 ENCOUNTER — Encounter: Payer: Self-pay | Admitting: Family

## 2022-10-08 DIAGNOSIS — I2510 Atherosclerotic heart disease of native coronary artery without angina pectoris: Secondary | ICD-10-CM

## 2022-10-08 HISTORY — DX: Atherosclerotic heart disease of native coronary artery without angina pectoris: I25.10

## 2022-10-08 MED ORDER — LABETALOL HCL 100 MG PO TABS: ORAL_TABLET | ORAL | 0 refills | Status: DC

## 2022-10-08 NOTE — Telephone Encounter (Signed)
Patient notified of results and new referral

## 2022-10-08 NOTE — Telephone Encounter (Signed)
Reviewed CT of his chest- no sign of lung cancer.  Not is made of widening of his thoracic aorta.  We will need to take another look at this in 12 months to make sure it is not enlarging.   Note was also made of some calcification of coronary arteries.  I would like to refer him to cardiology to see if they would like to do any additional testing.

## 2022-10-16 ENCOUNTER — Other Ambulatory Visit: Payer: Self-pay | Admitting: Family

## 2022-10-16 ENCOUNTER — Ambulatory Visit (INDEPENDENT_AMBULATORY_CARE_PROVIDER_SITE_OTHER): Payer: Medicare Other | Admitting: Family

## 2022-10-16 VITALS — BP 120/76 | HR 80 | Temp 98.0°F | Resp 18 | Ht 67.0 in | Wt 172.0 lb

## 2022-10-16 DIAGNOSIS — J329 Chronic sinusitis, unspecified: Secondary | ICD-10-CM | POA: Diagnosis not present

## 2022-10-16 DIAGNOSIS — G4700 Insomnia, unspecified: Secondary | ICD-10-CM | POA: Diagnosis not present

## 2022-10-16 MED ORDER — AMOXICILLIN-POT CLAVULANATE 875-125 MG PO TABS: 1.0000 | ORAL_TABLET | Freq: Two times a day (BID) | ORAL | 0 refills | Status: DC

## 2022-10-16 NOTE — Assessment & Plan Note (Addendum)
Uncontrolled. Reports good improvement with benadryl but doesn't feel well in the AM.  He has tried 25mg  of benadryl. Recommended that he decrease to 12.5mg  nightly. May add melatonin if needed.  He inquired about [place].  Given his age, I would like to try to avoid use of ambien.

## 2022-10-16 NOTE — Progress Notes (Signed)
Subjective:     Patient ID: Matthew Lee, male    DOB: 08-13-1956, 67 y.o.   MRN: [id number]  No chief complaint on file.   HPI Patient is in today to discuss possible sinus infection. Reports that he had covid around christmas.  He no longer feels bad, but he has continued sinus pressure and drainage.    Notes difficulty staying asleep at night.  Has tried melatonin, valerian.  Lays down between 8 and 9 pm.  He will wake up around midnight to 1 PM.  Falls back to sleep.  Then wakes up between 2 or 3 pm. Tosses and turns a lot after that.  No alcohol use.  He has cut back on his caffeine consumption.  No caffeine after 10 am.   He is working on a [employer].  Plans to add in more exercise.   Health Maintenance Due  Topic Date Due   COLONOSCOPY (Pts 45-48yrs Insurance coverage will need to be confirmed)  Never done   COVID-19 Vaccine (3 - 2023-24 season) 05/29/2022    Past Medical History:  Diagnosis Date   Aneurysm of thoracic aorta ([place]) 09/2022   dilated to 4 cm on CT   Diverticulitis    Headache(784.0)    Hepatitis C 08/24/2012   TX and cured per pt   History of tobacco abuse    Hypertension    long standing since 2007, was treated for sometime, but then discontinued taking meds on his own.   Pulmonary edema, acute ([place])    hospitalized in 09/2009 with hypertensive crisis and acute pulmonary edema- improved with lasix nad labetalol.    Past Surgical History:  Procedure Laterality Date   ESOPHAGOGASTRODUODENOSCOPY  08/21/2011   Procedure: ESOPHAGOGASTRODUODENOSCOPY (EGD);  Surgeon: Juanita Craver, MD;  Location: [facility];  Service: Endoscopy;  Laterality: N/A;   TONSILLECTOMY      Family History  Problem Relation Age of Onset   Polycystic kidney disease Father    Polycystic kidney disease Half-Brother    Polycystic kidney disease Half-Brother    Polycystic kidney disease Half-Sister    Colon cancer Neg Hx    Esophageal cancer Neg Hx    Stomach cancer Neg Hx      Social History   Socioeconomic History   Marital status: Married    Spouse name: Not on file   Number of children: Not on file   Years of education: Not on file   Highest education level: Not on file  Occupational History   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   Occupation: Lobbyist: [employer]  Tobacco Use   Smoking status: Former    Packs/day: 1.00    Years: 40.00    Total pack years: 40.00    Types: Cigarettes, E-cigarettes    Quit date: 09/23/2009    Years since quitting: 13.0   Smokeless tobacco: Never  Vaping Use   Vaping Use: Never used  Substance and Sexual Activity   Alcohol use: Not Currently   Drug use: No   Sexual activity: Not on file  Other Topics Concern   Not on file  Social History Narrative   Lives at home with wife, daughter and her 2 children.   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         Social Determinants of Health   Financial Resource Strain: Low Risk  (  05/29/2021)   Overall Financial Resource Strain (CARDIA)    Difficulty of Paying Living Expenses: Not hard at all  Food Insecurity: No Food Insecurity (05/29/2021)   Hunger Vital Sign    Worried About Running Out of Food in the Last Year: Never true    Ran Out of Food in the Last Year: Never true  Transportation Needs: No Transportation Needs (05/29/2021)   PRAPARE - Administrator, Civil Service (Medical): No    Lack of Transportation (Non-Medical): No  Physical Activity: Inactive (05/29/2021)   Exercise Vital Sign    Days of Exercise per Week: 0 days    Minutes of Exercise per Session: 0 min  Stress: No Stress Concern Present (05/29/2021)   [employer] of Occupational Health - Occupational Stress Questionnaire    Feeling of Stress : Only a little  Social Connections: Moderately Isolated (05/29/2021)   Social Connection and Isolation Panel [NHANES]     Frequency of Communication with Friends and Family: More than three times a week    Frequency of Social Gatherings with Friends and Family: More than three times a week    Attends Religious Services: Never    Database administrator or Organizations: No    Attends Banker Meetings: Never    Marital Status: Married  Catering manager Violence: Not At Risk (05/29/2021)   Humiliation, Afraid, Rape, and Kick questionnaire    Fear of Current or Ex-Partner: No    Emotionally Abused: No    Physically Abused: No    Sexually Abused: No    Outpatient Medications Prior to Visit  Medication Sig Dispense Refill   amLODipine (NORVASC) 10 MG tablet TAKE 1 TABLET DAILY 90 tablet 3   labetalol (NORMODYNE) 100 MG tablet TAKE ONE AND ONE-HALF TABLETS TWICE A DAY 270 tablet 1   labetalol (NORMODYNE) 100 MG tablet Take 1 and a 1/2 tablets twice a day 30 tablet 0   losartan (COZAAR) 50 MG tablet Take 1 tablet (50 mg total) by mouth daily. 90 tablet 1   No facility-administered medications prior to visit.    No Known Allergies  ROS See HPI    Objective:    Physical Exam Constitutional:      General: He is not in acute distress.    Appearance: He is well-developed.  HENT:     Head: Normocephalic and atraumatic.     Right Ear: Tympanic membrane and ear canal normal.     Left Ear: Tympanic membrane and ear canal normal.     Nose:     Right Sinus: No maxillary sinus tenderness or frontal sinus tenderness.     Left Sinus: No maxillary sinus tenderness or frontal sinus tenderness.     Mouth/Throat:     Mouth: Mucous membranes are moist.     Pharynx: No oropharyngeal exudate or posterior oropharyngeal erythema.  Cardiovascular:     Rate and Rhythm: Normal rate and regular rhythm.     Heart sounds: No murmur heard. Pulmonary:     Effort: Pulmonary effort is normal. No respiratory distress.     Breath sounds: Normal breath sounds. No wheezing or rales.  Skin:    General: Skin is warm and  dry.  Neurological:     Mental Status: He is alert and oriented to person, place, and time.  Psychiatric:        Behavior: Behavior normal.        Thought Content: Thought content normal.     BP  120/76   Pulse 80   Temp 98 F (36.7 C)   Resp 18   Ht 5\' 7"  (1.702 m)   Wt 172 lb (78 kg)   SpO2 99%   BMI 26.94 kg/m  Wt Readings from Last 3 Encounters:  10/16/22 172 lb (78 kg)  07/31/22 174 lb (78.9 kg)  06/02/22 173 lb 9.6 oz (78.7 kg)       Assessment & Plan:   Problem List Items Addressed This Visit       Unprioritized   Sinusitis - Primary    New.  Started following viral (covid) illness in the end of December.  Will rx with augmentin.       Relevant Medications   amoxicillin-clavulanate (AUGMENTIN) 875-125 MG tablet   Insomnia    Reports good improvement with benadryl but doesn't feel well in the AM.  He has tried 25mg  of benadryl. Recommended that he decrease to 12.5mg  nightly. May add melatonin if needed.  He inquired about [place].  Given his age, I would like to try to avoid use of ambien.        I am having Dorian Furnace. Karim start on amoxicillin-clavulanate. I am also having him maintain his amLODipine, losartan, labetalol, and labetalol.  Meds ordered this encounter  Medications   amoxicillin-clavulanate (AUGMENTIN) 875-125 MG tablet    Sig: Take 1 tablet by mouth 2 (two) times daily.    Dispense:  20 tablet    Refill:  0    Order Specific Question:   Supervising Provider    Answer:   Penni Homans A [[id number]]

## 2022-10-16 NOTE — Assessment & Plan Note (Addendum)
New.  Started following viral (covid) illness in the end of December.  Will rx with augmentin.

## 2022-10-17 ENCOUNTER — Encounter: Payer: Self-pay | Admitting: Family

## 2022-10-20 ENCOUNTER — Other Ambulatory Visit: Payer: Self-pay | Admitting: Family

## 2022-10-20 DIAGNOSIS — I10 Essential (primary) hypertension: Secondary | ICD-10-CM

## 2022-11-02 ENCOUNTER — Encounter (HOSPITAL_BASED_OUTPATIENT_CLINIC_OR_DEPARTMENT_OTHER): Payer: Self-pay

## 2022-11-02 ENCOUNTER — Ambulatory Visit (INDEPENDENT_AMBULATORY_CARE_PROVIDER_SITE_OTHER): Payer: Medicare Other | Admitting: Family

## 2022-11-02 ENCOUNTER — Other Ambulatory Visit (HOSPITAL_BASED_OUTPATIENT_CLINIC_OR_DEPARTMENT_OTHER): Payer: Self-pay

## 2022-11-02 VITALS — BP 150/90 | HR 65 | Temp 97.5°F | Resp 16 | Wt 174.0 lb

## 2022-11-02 DIAGNOSIS — K5792 Diverticulitis of intestine, part unspecified, without perforation or abscess without bleeding: Secondary | ICD-10-CM | POA: Insufficient documentation

## 2022-11-02 DIAGNOSIS — I2584 Coronary atherosclerosis due to calcified coronary lesion: Secondary | ICD-10-CM

## 2022-11-02 DIAGNOSIS — K8020 Calculus of gallbladder without cholecystitis without obstruction: Secondary | ICD-10-CM | POA: Insufficient documentation

## 2022-11-02 DIAGNOSIS — R972 Elevated prostate specific antigen [PSA]: Secondary | ICD-10-CM | POA: Diagnosis not present

## 2022-11-02 DIAGNOSIS — I2510 Atherosclerotic heart disease of native coronary artery without angina pectoris: Secondary | ICD-10-CM

## 2022-11-02 DIAGNOSIS — I10 Essential (primary) hypertension: Secondary | ICD-10-CM

## 2022-11-02 DIAGNOSIS — J810 Acute pulmonary edema: Secondary | ICD-10-CM | POA: Insufficient documentation

## 2022-11-02 DIAGNOSIS — I712 Thoracic aortic aneurysm, without rupture, unspecified: Secondary | ICD-10-CM

## 2022-11-02 DIAGNOSIS — Z1211 Encounter for screening for malignant neoplasm of colon: Secondary | ICD-10-CM

## 2022-11-02 DIAGNOSIS — R634 Abnormal weight loss: Secondary | ICD-10-CM | POA: Insufficient documentation

## 2022-11-02 HISTORY — DX: Calculus of gallbladder without cholecystitis without obstruction: K80.20

## 2022-11-02 MED ORDER — LOSARTAN POTASSIUM 50 MG PO TABS: 100.0000 mg | ORAL_TABLET | Freq: Every day | ORAL | 1 refills | Status: DC

## 2022-11-02 NOTE — Progress Notes (Signed)
Subjective:     Patient ID: Matthew Lee, male    DOB: [birth year], 67 y.o.   MRN: [id number]  Chief Complaint  Patient presents with   Hypertension    Here for follow up    HPI Patient is in today for follow up.   Last visit we treated him for sinusitis with augmentin.  Insomnia-  last visit we discussed trial of benadryl 12.5 mg.   HTN- Pt is maintained on amlodipine 10mg ,   labetalol 150mg  bid and losartan 50mg .    BP Readings from Last 3 Encounters:  11/02/22 (!) 145/91  10/16/22 120/76  07/31/22 (!) 164/90     Health Maintenance Due  Topic Date Due   COLONOSCOPY (Pts 45-23yrs Insurance coverage will need to be confirmed)  Never done   COVID-19 Vaccine (3 - 2023-24 season) 05/29/2022    Past Medical History:  Diagnosis Date   Aneurysm of thoracic aorta ([place]) 09/2022   dilated to 4 cm on CT   Diverticulitis    Headache(784.0)    Hepatitis C 08/24/2012   TX and cured per pt   History of tobacco abuse    Hypertension    long standing since 2007, was treated for sometime, but then discontinued taking meds on his own.   Pulmonary edema, acute ([place])    hospitalized in 09/2009 with hypertensive crisis and acute pulmonary edema- improved with lasix nad labetalol.    Past Surgical History:  Procedure Laterality Date   ESOPHAGOGASTRODUODENOSCOPY  08/21/2011   Procedure: ESOPHAGOGASTRODUODENOSCOPY (EGD);  Surgeon: Juanita Craver, MD;  Location: [facility];  Service: Endoscopy;  Laterality: N/A;   TONSILLECTOMY      Family History  Problem Relation Age of Onset   Polycystic kidney disease Father    Polycystic kidney disease Half-Brother    Polycystic kidney disease Half-Brother    Polycystic kidney disease Half-Sister    Colon cancer Neg Hx    Esophageal cancer Neg Hx    Stomach cancer Neg Hx     Social History   Socioeconomic History   Marital status: Married    Spouse name: Not on file   Number of children: Not on file   Years of education: Not on  file   Highest education level: Not on file  Occupational History   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   Occupation: Lobbyist: [employer]  Tobacco Use   Smoking status: Former    Packs/day: 1.00    Years: 40.00    Total pack years: 40.00    Types: Cigarettes, E-cigarettes    Quit date: 09/23/2009    Years since quitting: 13.1   Smokeless tobacco: Never  Vaping Use   Vaping Use: Never used  Substance and Sexual Activity   Alcohol use: Not Currently   Drug use: No   Sexual activity: Not on file  Other Topics Concern   Not on file  Social History Narrative   Lives at home with wife, daughter and her 2 children.   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         Social Determinants of Health   Financial Resource Strain: Low Risk  (05/29/2021)   Overall Financial Resource Strain (CARDIA)    Difficulty of Paying Living Expenses: Not hard at all  Food Insecurity: No Food Insecurity (05/29/2021)   Hunger Vital Sign    Worried About  Running Out of Food in the Last Year: Never true    [place] in the Last Year: Never true  Transportation Needs: No Transportation Needs (05/29/2021)   PRAPARE - Hydrologist (Medical): No    Lack of Transportation (Non-Medical): No  Physical Activity: Inactive (05/29/2021)   Exercise Vital Sign    Days of Exercise per Week: 0 days    Minutes of Exercise per Session: 0 min  Stress: No Stress Concern Present (05/29/2021)   [place]    Feeling of Stress : Only a little  Social Connections: Moderately Isolated (05/29/2021)   Social Connection and Isolation Panel [NHANES]    Frequency of Communication with Friends and Family: More than three times a week    Frequency of Social Gatherings with Friends and Family: More than three times a week     Attends Religious Services: Never    Marine scientist or Organizations: No    Attends Archivist Meetings: Never    Marital Status: Married  Human resources officer Violence: Not At Risk (05/29/2021)   Humiliation, Afraid, Rape, and Kick questionnaire    Fear of Current or Ex-Partner: No    Emotionally Abused: No    Physically Abused: No    Sexually Abused: No    Outpatient Medications Prior to Visit  Medication Sig Dispense Refill   amLODipine (NORVASC) 10 MG tablet TAKE 1 TABLET DAILY 90 tablet 3   labetalol (NORMODYNE) 100 MG tablet TAKE ONE AND ONE-HALF TABLETS TWICE A DAY 270 tablet 1   labetalol (NORMODYNE) 100 MG tablet Take 1 and a 1/2 tablets twice a day 30 tablet 0   losartan (COZAAR) 50 MG tablet TAKE 1 TABLET DAILY 90 tablet 1   amoxicillin-clavulanate (AUGMENTIN) 875-125 MG tablet Take 1 tablet by mouth 2 (two) times daily. 20 tablet 0   No facility-administered medications prior to visit.    No Known Allergies  ROS See HPI    Objective:    Physical Exam  BP (!) 145/91 (BP Location: Right Arm, Patient Position: Sitting, Cuff Size: Small)   Pulse 65   Temp (!) 97.5 F (36.4 C) (Oral)   Resp 16   Wt 174 lb (78.9 kg)   SpO2 99%   BMI 27.25 kg/m  Wt Readings from Last 3 Encounters:  11/02/22 174 lb (78.9 kg)  10/16/22 172 lb (78 kg)  07/31/22 174 lb (78.9 kg)       Assessment & Plan:   Problem List Items Addressed This Visit   None   I have discontinued Matthew Lee. Matthew Lee's amoxicillin-clavulanate. I am also having him maintain his amLODipine, labetalol, labetalol, and losartan.  No orders of the defined types were placed in this encounter.

## 2022-11-02 NOTE — Assessment & Plan Note (Signed)
Patient has appointment scheduled with cardiology

## 2022-11-02 NOTE — Assessment & Plan Note (Signed)
Declines colon cancer screening at this time 

## 2022-11-02 NOTE — Assessment & Plan Note (Signed)
Uncontrolled. Will increase losartan from 50mg  to 100mg .

## 2022-11-02 NOTE — Assessment & Plan Note (Signed)
Lab Results  Component Value Date   PSA 2.71 07/31/2022   PSA 4.46 (H) 06/17/2022   PSA 9.28 (H) 05/26/2022    Was normal last visit. Monitor.

## 2022-11-02 NOTE — Progress Notes (Signed)
Subjective:   By signing my name below, I, Shehryar Baig, attest that this documentation has been prepared under the direction and in the presence of Debbrah Alar, NP. 11/02/2022   Patient ID: Matthew Lee, male    DOB: 11/25/1955, 67 y.o.   MRN: [id number]  Chief Complaint  Patient presents with   Hypertension    Here for follow up    Hypertension   Patient is in today for a follow up visit.   Last visit we treated him for sinusitis with augmentin.   Insomnia:  last visit we discussed trial of benadryl 12.5 mg.    HTN: Pt is maintained on amlodipine 10mg ,   labetalol 150mg  bid and losartan 50mg .  BP Readings from Last 3 Encounters:  11/02/22 (!) 150/90  10/16/22 120/76  07/31/22 (!) 164/90   Pulse Readings from Last 3 Encounters:  11/02/22 65  10/16/22 80  07/31/22 72   Colonoscopy: He is due for colonoscopy and is planning on scheduling an appointment.   Lung cancer screening: He improved his diet since finding widening in his aorta during his last lung cancer screening. He is eating less meat and overall eat healthier.   Past Medical History:  Diagnosis Date   Aneurysm of thoracic aorta ([place]) 09/2022   dilated to 4 cm on CT   Diverticulitis    Headache(784.0)    Hepatitis C 08/24/2012   TX and cured per pt   History of tobacco abuse    Hypertension    long standing since 2007, was treated for sometime, but then discontinued taking meds on his own.   Pulmonary edema, acute ([place])    hospitalized in 09/2009 with hypertensive crisis and acute pulmonary edema- improved with lasix nad labetalol.    Past Surgical History:  Procedure Laterality Date   ESOPHAGOGASTRODUODENOSCOPY  08/21/2011   Procedure: ESOPHAGOGASTRODUODENOSCOPY (EGD);  Surgeon: Juanita Craver, MD;  [facility];  Service: Endoscopy;  Laterality: N/A;   TONSILLECTOMY      Family History  Problem Relation Age of Onset   Polycystic kidney disease Father    Polycystic kidney disease  Half-Brother    Polycystic kidney disease Half-Brother    Polycystic kidney disease Half-Sister    Colon cancer Neg Hx    Esophageal cancer Neg Hx    Stomach cancer Neg Hx     Social History   Socioeconomic History   Marital status: Married    Spouse name: Not on file   Number of children: Not on file   Years of education: Not on file   Highest education level: Not on file  Occupational History   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   Occupation: Lobbyist: [employer]  Tobacco Use   Smoking status: Former    Packs/day: 1.00    Years: 40.00    Total pack years: 40.00    Types: Cigarettes, E-cigarettes    Quit date: 09/23/2009    Years since quitting: 13.1   Smokeless tobacco: Never  Vaping Use   Vaping Use: Never used  Substance and Sexual Activity   Alcohol use: Not Currently   Drug use: No   Sexual activity: Not on file  Other Topics Concern   Not on file  Social History Narrative   Lives at home with wife, daughter and her 2 children.   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  Social Determinants of Health   Financial Resource Strain: Low Risk  (05/29/2021)   Overall Financial Resource Strain (CARDIA)    Difficulty of Paying Living Expenses: Not hard at all  Food Insecurity: No Food Insecurity (05/29/2021)   Hunger Vital Sign    Worried About Running Out of Food in the Last Year: Never true    Ran Out of Food in the Last Year: Never true  Transportation Needs: No Transportation Needs (05/29/2021)   PRAPARE - Hydrologist (Medical): No    Lack of Transportation (Non-Medical): No  Physical Activity: Inactive (05/29/2021)   Exercise Vital Sign    Days of Exercise per Week: 0 days    Minutes of Exercise per Session: 0 min  Stress: No Stress Concern Present (05/29/2021)   [place]    Feeling of Stress : Only a little  Social Connections: Moderately Isolated (05/29/2021)   Social Connection and Isolation Panel [NHANES]    Frequency of Communication with Friends and Family: More than three times a week    Frequency of Social Gatherings with Friends and Family: More than three times a week    Attends Religious Services: Never    Marine scientist or Organizations: No    Attends Archivist Meetings: Never    Marital Status: Married  Human resources officer Violence: Not At Risk (05/29/2021)   Humiliation, Afraid, Rape, and Kick questionnaire    Fear of Current or Ex-Partner: No    Emotionally Abused: No    Physically Abused: No    Sexually Abused: No    Outpatient Medications Prior to Visit  Medication Sig Dispense Refill   amLODipine (NORVASC) 10 MG tablet TAKE 1 TABLET DAILY 90 tablet 3   labetalol (NORMODYNE) 100 MG tablet TAKE ONE AND ONE-HALF TABLETS TWICE A DAY 270 tablet 1   labetalol (NORMODYNE) 100 MG tablet Take 1 and a 1/2 tablets twice a day 30 tablet 0   losartan (COZAAR) 50 MG tablet TAKE 1 TABLET DAILY 90 tablet 1   amoxicillin-clavulanate (AUGMENTIN) 875-125 MG tablet Take 1 tablet by mouth 2 (two) times daily. 20 tablet 0   No facility-administered medications prior to visit.    No Known Allergies  ROS    See HPI Objective:    Physical Exam Constitutional:      General: He is not in acute distress.    Appearance: Normal appearance. He is not ill-appearing.  HENT:     Head: Normocephalic and atraumatic.     Right Ear: External ear normal.     Left Ear: External ear normal.  Eyes:     Extraocular Movements: Extraocular movements intact.     Pupils: Pupils are equal, round, and reactive to light.  Cardiovascular:     Rate and Rhythm: Normal rate and regular rhythm.     Heart sounds: Normal heart sounds. No murmur heard.    No gallop.     Comments: Blood pressure measured 150/90 during manual  recheck Pulmonary:     Effort: Pulmonary effort is normal. No respiratory distress.     Breath sounds: Normal breath sounds. No wheezing or rales.  Skin:    General: Skin is warm and dry.  Neurological:     Mental Status: He is alert and oriented to person, place, and time.  Psychiatric:        Judgment: Judgment normal.     BP (!) 150/90  Pulse 65   Temp (!) 97.5 F (36.4 C) (Oral)   Resp 16   Wt 174 lb (78.9 kg)   SpO2 99%   BMI 27.25 kg/m  Wt Readings from Last 3 Encounters:  11/02/22 174 lb (78.9 kg)  10/16/22 172 lb (78 kg)  07/31/22 174 lb (78.9 kg)       Assessment & Plan:  Coronary artery calcification Assessment & Plan: Patient has appointment scheduled with cardiology.    Essential hypertension Assessment & Plan: Uncontrolled. Will increase losartan from 50mg  to 100mg .   Orders: -     Losartan Potassium; Take 2 tablets (100 mg total) by mouth daily.  Dispense: 90 tablet; Refill: 1  Elevated PSA Assessment & Plan: Lab Results  Component Value Date   PSA 2.71 07/31/2022   PSA 4.46 (H) 06/17/2022   PSA 9.28 (H) 05/26/2022    Was normal last visit. Monitor.    Thoracic aortic aneurysm without rupture, unspecified part [facility]) Assessment & Plan: Will need 12 month follow up imaging.  If enlarging will need referral to thoracic surgery.    Colon cancer screening Assessment & Plan: Declines colon cancer screening at this time.      I, Nance Pear, NP, personally preformed the services described in this documentation.  All medical record entries made by the scribe were at my direction and in my presence.  I have reviewed the chart and discharge instructions (if applicable) and agree that the record reflects my personal performance and is accurate and complete. 11/02/2022   I,Shehryar Baig,acting as a scribe for Nance Pear, NP.,have documented all relevant documentation on the behalf of Nance Pear, NP,as directed by   Nance Pear, NP while in the presence of Nance Pear, NP.   Nance Pear, NP

## 2022-11-02 NOTE — Assessment & Plan Note (Signed)
Will need 12 month follow up imaging.  If enlarging will need referral to thoracic surgery.

## 2022-11-02 NOTE — Assessment & Plan Note (Signed)
>>  ASSESSMENT AND PLAN FOR ESSENTIAL HYPERTENSION WRITTEN ON 11/02/2022  8:10 AM BY O'SULLIVAN, Elisheba Mcdonnell, NP  Uncontrolled. Will increase losartan  from 50mg  to 100mg .

## 2022-11-11 ENCOUNTER — Ambulatory Visit: Payer: Medicare Other | Attending: Cardiology | Admitting: Cardiology

## 2022-11-11 ENCOUNTER — Encounter: Payer: Self-pay | Admitting: Cardiology

## 2022-11-11 VITALS — BP 152/78 | HR 75 | Ht 67.0 in | Wt 171.1 lb

## 2022-11-11 DIAGNOSIS — R0609 Other forms of dyspnea: Secondary | ICD-10-CM | POA: Insufficient documentation

## 2022-11-11 DIAGNOSIS — I2584 Coronary atherosclerosis due to calcified coronary lesion: Secondary | ICD-10-CM

## 2022-11-11 DIAGNOSIS — I2510 Atherosclerotic heart disease of native coronary artery without angina pectoris: Secondary | ICD-10-CM

## 2022-11-11 DIAGNOSIS — E782 Mixed hyperlipidemia: Secondary | ICD-10-CM

## 2022-11-11 DIAGNOSIS — I712 Thoracic aortic aneurysm, without rupture, unspecified: Secondary | ICD-10-CM | POA: Diagnosis not present

## 2022-11-11 DIAGNOSIS — Z8619 Personal history of other infectious and parasitic diseases: Secondary | ICD-10-CM

## 2022-11-11 DIAGNOSIS — Z87891 Personal history of nicotine dependence: Secondary | ICD-10-CM

## 2022-11-11 DIAGNOSIS — I10 Essential (primary) hypertension: Secondary | ICD-10-CM | POA: Diagnosis not present

## 2022-11-11 DIAGNOSIS — R0989 Other specified symptoms and signs involving the circulatory and respiratory systems: Secondary | ICD-10-CM

## 2022-11-11 HISTORY — DX: Other specified symptoms and signs involving the circulatory and respiratory systems: R09.89

## 2022-11-11 HISTORY — DX: Personal history of other infectious and parasitic diseases: Z86.19

## 2022-11-11 HISTORY — DX: Other forms of dyspnea: R06.09

## 2022-11-11 HISTORY — DX: Mixed hyperlipidemia: E78.2

## 2022-11-11 HISTORY — DX: Personal history of nicotine dependence: Z87.891

## 2022-11-11 MED ORDER — HYDROCHLOROTHIAZIDE 25 MG PO TABS: 25.0000 mg | ORAL_TABLET | Freq: Every day | ORAL | 0 refills | Status: DC

## 2022-11-11 MED ORDER — HYDROCHLOROTHIAZIDE 25 MG PO TABS: 25.0000 mg | ORAL_TABLET | Freq: Every day | ORAL | 3 refills | Status: DC

## 2022-11-11 NOTE — Progress Notes (Signed)
Cardiology Office Note:    Date:  11/11/2022   ID:  Matthew Lee, DOB July 18, 1956, MRN [id number]  PCP:  Debbrah Alar, NP  Cardiologist:  Jenean Lindau, MD   Referring MD: Debbrah Alar, NP    ASSESSMENT:    1. Essential hypertension   2. Coronary artery calcification   3. Thoracic aortic aneurysm without rupture, unspecified part ([place])   4. Ex-smoker   5. History of hepatitis C   6. Mixed dyslipidemia   7. Dyspnea on exertion   8. Prominent abdominal aortic pulsation    PLAN:    In order of problems listed above:  Dyspnea on exertion: Coronary atherosclerosis: Secondary prevention stressed with the patient.  Importance of compliance with diet medication stressed and vocalized understanding.  To rule out any ischemic substrate we will do an exercise stress Cardiolite and he is agreeable. Essential hypertension: I have added hydrochlorothiazide 25 mg to his regimen.  He mentions to me that his blood pressure is high at home.  Salt intake issues and diet emphasized.  And he promises to do better. Mixed dyslipidemia we will review lipids and start him on statin therapy.  I discussed this with him at length benefits risks explained. Aneurysm of the artery: We will check this and monitor it on a yearly basis. History of hepatitis C and former smoking: Patient has abnormal pulsations which are appearing prominent to me so we will do an ultrasound. Cardiac murmur: Echocardiogram will be done to assess murmur heard on auscultation. Patient will bring his pulse blood pressure log when he comes for the stress test. Patient will be seen in follow-up appointment in 6 months or earlier if the patient has any concerns.  Visit was accompanied and chaperoned by Alfredia Client    Medication Adjustments/Labs and Tests Ordered: Current medicines are reviewed at length with the patient today.  Concerns regarding medicines are outlined above.  No orders of the defined types were placed  in this encounter.  No orders of the defined types were placed in this encounter.    History of Present Illness:    Matthew Lee is a 67 y.o. male who is being seen today for the evaluation of coronary artery disease at the request of Debbrah Alar, NP.  Patient is a pleasant 67 year old male.  He has past medical history of aortic ascending dilatation, ex-smoker, essential hypertension, mixed dyslipidemia and history of hepatitis C.  Patient tells me that he is treated here for this.  He denies any chest pain orthopnea or PND.  Calcium was found extensively coronary arteries and aorta and for this reason he was sent here for evaluation.  At the time of my evaluation is alert awake oriented and in no distress.  He tells me that he works actively and with work he has to walk significantly and has no chest pain.  He has some dyspnea on exertion.  He does have palpitations at times.  No dizziness or syncope.  At the time of my evaluation, the patient is alert awake oriented and in no distress.  Past Medical History:  Diagnosis Date   Abnormal weight loss 11/02/2022   Aneurysm of thoracic aorta ([place]) 09/2022   dilated to 4 cm on CT   Cholelithiasis 11/02/2022   Colon cancer screening 07/31/2022   Coronary artery calcification 10/08/2022   Diverticulitis    Elevated PSA 07/31/2022   Essential hypertension 09/26/2009   Qualifier: Diagnosis of   By: Amalia Hailey MD, Legrand Como  Gall bladder polyp 02/18/2015   Overview:   - 6 mm (as described from [place] abd in 2015). Repeat US abd lim ordered.   GERD (gastroesophageal reflux disease) 09/07/2013   Hepatitis C 08/24/2012   TX and cured per pt   History of COVID-19 07/31/2022   History of tobacco abuse    Hyperglycemia 11/22/2014   Hypertension    long standing since 2007, was treated for sometime, but then discontinued taking meds on his own.   Immunization counseling 04/01/2022   Insomnia 10/31/2009   Qualifier: Diagnosis of   By: Ina Homes MD,  Amanjot       Mild diastolic dysfunction 0000000   Preventative health care 12/31/2014   Pulmonary edema, acute ([place])    hospitalized in 09/2009 with hypertensive crisis and acute pulmonary edema- improved with lasix nad labetalol.    Past Surgical History:  Procedure Laterality Date   ESOPHAGOGASTRODUODENOSCOPY  08/21/2011   Procedure: ESOPHAGOGASTRODUODENOSCOPY (EGD);  Surgeon: Juanita Craver, MD;  Location: [facility];  Service: Endoscopy;  Laterality: N/A;   TONSILLECTOMY      Current Medications: Current Meds  Medication Sig   amLODipine (NORVASC) 10 MG tablet TAKE 1 TABLET DAILY   labetalol (NORMODYNE) 100 MG tablet Take 1 and a 1/2 tablets twice a day   losartan (COZAAR) 50 MG tablet Take 2 tablets (100 mg total) by mouth daily.   [DISCONTINUED] labetalol (NORMODYNE) 100 MG tablet TAKE ONE AND ONE-HALF TABLETS TWICE A DAY     Allergies:   Patient has no known allergies.   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   Occupation: Lobbyist: [employer]  Tobacco Use   Smoking status: Former    Packs/day: 1.00    Years: 40.00    Total pack years: 40.00    Types: Cigarettes, E-cigarettes    Quit date: 09/23/2009    Years since quitting: 13.1   Smokeless tobacco: Never  Vaping Use   Vaping Use: Never used  Substance and Sexual Activity   Alcohol use: Not Currently   Drug use: No   Sexual activity: Not on file  Other Topics Concern   Not on file  Social History Narrative   Lives at home with wife, daughter and her 2 children.   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         Social Determinants of Health   Financial  Resource Strain: Low Risk  (05/29/2021)   Overall Financial Resource Strain (CARDIA)    Difficulty of Paying Living Expenses: Not hard at all  Food Insecurity: No Food Insecurity (05/29/2021)   Hunger Vital Sign    Worried About Running Out of Food in the Last Year: Never true    Ran Out of Food in the Last Year: Never true  Transportation Needs: No Transportation Needs (05/29/2021)   PRAPARE - Hydrologist (Medical): No    Lack of Transportation (Non-Medical): No  Physical Activity: Inactive (05/29/2021)   Exercise Vital Sign    Days of Exercise per Week: 0 days    Minutes of Exercise per Session: 0 min  Stress: No Stress Concern Present (05/29/2021)   [place]    Feeling of Stress : Only a little  Social Connections: Moderately Isolated (05/29/2021)   Social Connection and Isolation Panel [NHANES]    Frequency of Communication with Friends and Family: More than three times a week    Frequency of Social Gatherings with Friends and Family: More than three times a week    Attends Religious Services: Never    Marine scientist or Organizations: No    Attends Music therapist: Never    Marital Status: Married     Family History: The patient's family history includes Polycystic kidney disease in his father, half-brother, half-brother, and half-sister. There is no history of Colon cancer, Esophageal cancer, or Stomach cancer.  ROS:   Please see the history of present illness.    All other systems reviewed and are negative.  EKGs/Labs/Other Studies Reviewed:    The following studies were reviewed today: EKG reveals sinus rhythm with PVCs.   Recent Labs: 05/26/2022: ALT 13 05/28/2022: Hemoglobin 13.6; Platelets 276.0 07/31/2022: BUN 17; Creatinine, Ser 0.84; Potassium 4.4; Sodium 137  Recent Lipid Panel    Component Value Date/Time   CHOL 173 04/09/2021 0808   TRIG 58.0  04/09/2021 0808   HDL 39.60 04/09/2021 0808   CHOLHDL 4 04/09/2021 0808   VLDL 11.6 04/09/2021 0808   LDLCALC 122 (H) 04/09/2021 0808    Physical Exam:    VS:  BP (!) 152/78   Pulse 75   Ht 5' 7"$  (1.702 m)   Wt 171 lb 1.3 oz (77.6 kg)   SpO2 97%   BMI 26.79 kg/m     Wt Readings from Last 3 Encounters:  11/11/22 171 lb 1.3 oz (77.6 kg)  11/02/22 174 lb (78.9 kg)  10/16/22 172 lb (78 kg)     GEN: Patient is in no acute distress HEENT: Normal NECK: No JVD; No carotid bruits LYMPHATICS: No lymphadenopathy CARDIAC: S1 S2 regular, 2/6 systolic murmur at the apex. RESPIRATORY:  Clear to auscultation without rales, wheezing or rhonchi  ABDOMEN: Soft, non-tender, non-distended MUSCULOSKELETAL:  No edema; No deformity  SKIN: Warm and dry NEUROLOGIC:  Alert and oriented x 3 PSYCHIATRIC:  Normal affect    Signed, Jenean Lindau, MD  11/11/2022 9:07 AM    [place]

## 2022-11-11 NOTE — Patient Instructions (Addendum)
Medication Instructions:  Your physician has recommended you make the following change in your medication:   Start Hydrochlorothiazide 25 mg daily.  *If you need a refill on your cardiac medications before your next appointment, please call your pharmacy*   Lab Work: Your physician recommends that you return for lab work in: the morning of your test. You need to have labs done when you are fasting.  You can come Monday through Friday 8:30 am to 12:00 pm and 1:15 to 4:30. You do not need to make an appointment as the order has already been placed. The labs you are going to have done are CMET, CBC, TSH and Lipids.  If you have labs (blood work) drawn today and your tests are completely normal, you will receive your results only by: [place] (if you have MyChart) OR A paper copy in the mail If you have any lab test that is abnormal or we need to change your treatment, we will call you to review the results.   Testing/Procedures: You are scheduled for a Myocardial Perfusion Imaging Study.  Please arrive 15 minutes prior to your appointment time for registration and insurance purposes.  The test will take approximately 3 to 4 hours to complete; you may bring reading material.  If someone comes with you to your appointment, they will need to remain in the main lobby due to limited space in the testing area.   How to prepare for your Myocardial Perfusion Test: Do not eat or drink 3 hours prior to your test, except you may have water. Do not consume products containing caffeine (regular or decaffeinated) 12 hours prior to your test. (ex: coffee, chocolate, sodas, tea). Do bring a list of your current medications with you.  If not listed below, you may take your medications as normal. Do not take labetolol (Trandate) for 24 hours prior to the test.  Bring the medication to your appointment as you may be required to take it once the test is complete. Do wear comfortable clothes (no dresses  or overalls) and walking shoes, tennis shoes preferred (No heels or open toe shoes are allowed). Do NOT wear cologne, perfume, aftershave, or lotions (deodorant is allowed). If these instructions are not followed, your test will have to be rescheduled.  If you cannot keep your appointment, please provide 24 hours notification to the Nuclear Lab, to avoid a possible $50 charge to your account.   Your physician has requested that you have an echocardiogram. Echocardiography is a painless test that uses sound waves to create images of your heart. It provides your doctor with information about the size and shape of your heart and how well your heart's chambers and valves are working. This procedure takes approximately one hour. There are no restrictions for this procedure. Please do NOT wear cologne, perfume, aftershave, or lotions (deodorant is allowed). Please arrive 15 minutes prior to your appointment time.  Your physician has requested that you have an abdominal aorta duplex. During this test, an ultrasound is used to evaluate the aorta. Allow 30 minutes for this exam. Do not eat after midnight the day before and avoid carbonated beverages   Follow-Up: At [facility], you and your health needs are our priority.  As part of our continuing mission to provide you with exceptional heart care, we have created designated Provider Care Teams.  These Care Teams include your primary Cardiologist (physician) and Advanced Practice Providers (APPs -  Physician Assistants and Nurse Practitioners) who all work  together to provide you with the care you need, when you need it.  We recommend signing up for the patient portal called "MyChart".  Sign up information is provided on this After Visit Summary.  MyChart is used to connect with patients for Virtual Visits (Telemedicine).  Patients are able to view lab/test results, encounter notes, upcoming appointments, etc.  Non-urgent messages can be sent to  your provider as well.   To learn more about what you can do with MyChart, go to [email or web address].    Your next appointment:   6 month(s)  Provider:   Jyl Heinz, MD   Other Instructions This visit was accompanied by Jerl Santos, [place].  Cardiac Nuclear Scan A cardiac nuclear scan is a test that is done to check the flow of blood to your heart. It is done when you are resting and when you are exercising. The test looks for problems such as: Not enough blood reaching a portion of the heart. The heart muscle not working as it should. You may need this test if you have: Heart disease. Lab results that are not normal. Had heart surgery or a balloon procedure to open up blocked arteries (angioplasty) or a small mesh tube (stent). Chest pain. Shortness of breath. Had a heart attack. In this test, a special dye (tracer) is put into your bloodstream. The tracer will travel to your heart. A camera will then take pictures of your heart to see how the tracer moves through your heart. This test is usually done at a hospital and takes 2-4 hours. Tell a doctor about: Any allergies you have. All medicines you are taking, including vitamins, herbs, eye drops, creams, and over-the-counter medicines. Any bleeding problems you have. Any surgeries you have had. Any medical conditions you have. Whether you are pregnant or may be pregnant. Any history of asthma or long-term (chronic) lung disease. Any history of heart rhythm disorders or heart valve conditions. What are the risks? Your doctor will talk with you about risks. These may include: Serious chest pain and heart attack. This is only a risk if the stress portion of the test is done. Fast or uneven heartbeats (palpitations). A feeling of warmth in your chest. This feeling usually does not last long. Allergic reaction to the tracer. Shortness of breath or trouble breathing. What happens before the test? Ask your doctor about  changing or stopping your normal medicines. Follow instructions from your doctor about what you cannot eat or drink. Remove your jewelry on the day of the test. Ask your doctor if you need to avoid nicotine or caffeine. What happens during the test? An IV tube will be inserted into one of your veins. Your doctor will give you a small amount of tracer through the IV tube. You will wait for 20-40 minutes while the tracer moves through your bloodstream. Your heart will be monitored with an electrocardiogram (ECG). You will lie down on an exam table. Pictures of your heart will be taken for about 15-20 minutes. You may also have a stress test. For this test, one of these things may be done: You will be asked to exercise on a treadmill or a stationary bike. You will be given medicines that will make your heart work harder. This is done if you are unable to exercise. When blood flow to your heart has peaked, a tracer will again be given through the IV tube. After 20-40 minutes, you will get back on the exam table. More pictures will be  taken of your heart. Depending on the tracer that is used, more pictures may need to be taken 3-4 hours later. Your IV tube will be removed when the test is over. The test may vary among doctors and hospitals. What happens after the test? Ask your doctor: Whether you can return to your normal schedule, including diet, activities, travel, and medicines. Whether you should drink more fluids. This will help to remove the tracer from your body. Ask your doctor, or the department that is doing the test: When will my results be ready? How will I get my results? What are my treatment options? What other tests do I need? What are my next steps? This information is not intended to replace advice given to you by your health care provider. Make sure you discuss any questions you have with your health care provider. Document Revised: 02/10/2022 Document Reviewed:  02/10/2022 Elsevier Patient Education  [place].  Echocardiogram An echocardiogram is a test that uses sound waves (ultrasound) to produce images of the heart. Images from an echocardiogram can provide important information about: Heart size and shape. The size and thickness and movement of your heart's walls. Heart muscle function and strength. Heart valve function or if you have stenosis. Stenosis is when the heart valves are too narrow. If blood is flowing backward through the heart valves (regurgitation). A tumor or infectious growth around the heart valves. Areas of heart muscle that are not working well because of poor blood flow or injury from a heart attack. Aneurysm detection. An aneurysm is a weak or damaged part of an artery wall. The wall bulges out from the normal force of blood pumping through the body. Tell a health care provider about: Any allergies you have. All medicines you are taking, including vitamins, herbs, eye drops, creams, and over-the-counter medicines. Any blood disorders you have. Any surgeries you have had. Any medical conditions you have. Whether you are pregnant or may be pregnant. What are the risks? Generally, this is a safe test. However, problems may occur, including an allergic reaction to dye (contrast) that may be used during the test. What happens before the test? No specific preparation is needed. You may eat and drink normally. What happens during the test?  You will take off your clothes from the waist up and put on a hospital gown. Electrodes or electrocardiogram (ECG)patches may be placed on your chest. The electrodes or patches are then connected to a device that monitors your heart rate and rhythm. You will lie down on a table for an ultrasound exam. A gel will be applied to your chest to help sound waves pass through your skin. A handheld device, called a transducer, will be pressed against your chest and moved over your heart.  The transducer produces sound waves that travel to your heart and bounce back (or "echo" back) to the transducer. These sound waves will be captured in real-time and changed into images of your heart that can be viewed on a video monitor. The images will be recorded on a computer and reviewed by your health care provider. You may be asked to change positions or hold your breath for a short time. This makes it easier to get different views or better views of your heart. In some cases, you may receive contrast through an IV in one of your veins. This can improve the quality of the pictures from your heart. The procedure may vary among health care providers and hospitals. What can I  expect after the test? You may return to your normal, everyday life, including diet, activities, and medicines, unless your health care provider tells you not to do that. Follow these instructions at home: It is up to you to get the results of your test. Ask your health care provider, or the department that is doing the test, when your results will be ready. Keep all follow-up visits. This is important. Summary An echocardiogram is a test that uses sound waves (ultrasound) to produce images of the heart. Images from an echocardiogram can provide important information about the size and shape of your heart, heart muscle function, heart valve function, and other possible heart problems. You do not need to do anything to prepare before this test. You may eat and drink normally. After the echocardiogram is completed, you may return to your normal, everyday life, unless your health care provider tells you not to do that. This information is not intended to replace advice given to you by your health care provider. Make sure you discuss any questions you have with your health care provider. Document Revised: 05/28/2021 Document Reviewed: 05/07/2020 Elsevier Patient Education  [place] personal EKG: The  world's most clinically validated personal EKG, FDA-cleared to detect Atrial Fibrillation, Bradycardia, and Tachycardia. Evalee Mutton is the most reliable way to check in on your heart from home. Take your EKG from anywhere: Capture a medical-grade EKG in 30 seconds and get an instant analysis right on your smartphone. Evalee Mutton is small enough to fit in your pocket, so you can take it with you anywhere. Easy to use: Simply place your fingers on the sensors--no wires, patches, or gels. Recommended by doctors: A trusted resource, Evalee Mutton is the #1 doctor-recommended personal EKG with more than 100 million EKGs recorded. Save or share your EKGs: With the press of a button, email your EKGs to your doctor or save them on your phone. Works with smartphones: Compatible with Tour manager and tablets. Check our compatibility chart. FSA/HSA eligible: Purchase using an FSA or HSA account (please confirm coverage with your insurance provider). Phone clip included with purchase, a $15 value. Conveniently take your device with you wherever you go.  https://store.BasicBling.tn   Step One- Record your EKG strip on [facility] app.   Step two- On [facility] EKG click "Download"   Step three- It will prompt you to make a password for this EKG. Please make the password "Revankar" so that we can view it.   Step four- Click on the little "upload" button (small box with an arrow in the middle) in the bottom left-hand corner of the screen.   Step five- Click "Save to Files"  Step six- Click on "On my iphone" and then "Pages" then press save in the top right-hand corner.   NOW GO TO MYCHART   Once on MyChart click "Messages"  Step one- Click "Send a message"  Step two- Click "Ask a medical question"   Step three- Click "Non urgent medical question"   Step four- Click on Rajan Revankar's name.  Step five- Click on the small paperclip at the bottom of the  screen  Step six- Click "Choose file"  Step seven- Pick the most recent EKG strip listed.   Once uploaded send the message!

## 2022-11-11 NOTE — Addendum Note (Signed)
Addended by: Truddie Hidden on: 11/11/2022 09:24 AM   Modules accepted: Orders

## 2022-11-11 NOTE — Progress Notes (Signed)
This visit was accompanied by Jerl Santos.

## 2022-11-12 ENCOUNTER — Encounter (HOSPITAL_COMMUNITY): Payer: Self-pay | Admitting: *Deleted

## 2022-11-12 ENCOUNTER — Telehealth (HOSPITAL_COMMUNITY): Payer: Self-pay | Admitting: *Deleted

## 2022-11-12 NOTE — Telephone Encounter (Signed)
My Chart letter sent outlining instructions for upcoming stress test on 11/18/22 at 8:00.

## 2022-11-23 ENCOUNTER — Ambulatory Visit (INDEPENDENT_AMBULATORY_CARE_PROVIDER_SITE_OTHER): Payer: Medicare Other | Admitting: Family

## 2022-11-23 VITALS — BP 124/82 | HR 69 | Temp 98.4°F | Resp 16 | Wt 170.0 lb

## 2022-11-23 DIAGNOSIS — I10 Essential (primary) hypertension: Secondary | ICD-10-CM

## 2022-11-23 LAB — BASIC METABOLIC PANEL
BUN: 28 mg/dL — ABNORMAL HIGH (ref 6–23)
CO2: 28 mEq/L (ref 19–32)
Calcium: 9.8 mg/dL (ref 8.4–10.5)
Chloride: 101 mEq/L (ref 96–112)
Creatinine, Ser: 1.16 mg/dL (ref 0.40–1.50)
GFR: 64.78 mL/min (ref >60.00)
Glucose, Bld: 107 mg/dL — ABNORMAL HIGH (ref 70–99)
Potassium: 4.4 mEq/L (ref 3.5–5.1)
Sodium: 138 mEq/L (ref 135–145)

## 2022-11-23 NOTE — Progress Notes (Signed)
Subjective:   By signing my name below, I, Jamey Reas, attest that this documentation has been prepared under the direction and in the presence of Debbrah Alar, NP. 11/23/2022   Patient ID: Matthew Lee, male    DOB: Jul 18, 1956, 67 y.o.   MRN: [id number]  Chief Complaint  Patient presents with   Hypertension    Here for follow up    HPI Patient is in today for a follow-up appointment.   Blood pressure: His blood pressure is elevated during this visit. He is taking '50mg'$  losartan 2x daily PO. He is also taking hctz '25mg'$  once daily.   BP Readings from Last 3 Encounters:  11/23/22 124/82  11/11/22 (!) 152/78  11/02/22 (!) 150/90     Past Medical History:  Diagnosis Date   Abnormal weight loss 11/02/2022   Aneurysm of thoracic aorta ([place]) 09/2022   dilated to 4 cm on CT   Cholelithiasis 11/02/2022   Colon cancer screening 07/31/2022   Coronary artery calcification 10/08/2022   Diverticulitis    Elevated PSA 07/31/2022   Essential hypertension 09/26/2009   Qualifier: Diagnosis of   By: Amalia Hailey MD, [place] Ku bladder polyp 02/18/2015   Overview:   - 6 mm (as described from [place] abd in 2015). Repeat US abd lim ordered.   GERD (gastroesophageal reflux disease) 09/07/2013   Hepatitis C 08/24/2012   TX and cured per pt   History of COVID-19 07/31/2022   History of tobacco abuse    Hyperglycemia 11/22/2014   Hypertension    long standing since 2007, was treated for sometime, but then discontinued taking meds on his own.   Immunization counseling 04/01/2022   Insomnia 10/31/2009   Qualifier: Diagnosis of   By: Ina Homes MD, Aman[place]ot       Mild diastolic dysfunction 0000000   Preventative health care 12/31/2014   Pulmonary edema, acute ([place])    hospitalized in 09/2009 with hypertensive crisis and acute pulmonary edema- improved with lasix nad labetalol.    Past Surgical History:  Procedure Laterality Date   ESOPHAGOGASTRODUODENOSCOPY  08/21/2011   Procedure:  ESOPHAGOGASTRODUODENOSCOPY (EGD);  Surgeon: Juanita Craver, MD;  Location: [facility];  Service: Endoscopy;  Laterality: N/A;   TONSILLECTOMY      Family History  Problem Relation Age of Onset   Polycystic kidney disease Father    Polycystic kidney disease Half-Brother    Polycystic kidney disease Half-Brother    Polycystic kidney disease Half-Sister    Colon cancer Neg Hx    Esophageal cancer Neg Hx    Stomach cancer Neg Hx     Social History   Socioeconomic History   Marital status: Married    Spouse name: Not on file   Number of children: Not on file   Years of education: Not on file   Highest education level: Not on file  Occupational History   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   Occupation: Lobbyist: [employer]  Tobacco Use   Smoking status: Former    Packs/day: 1.00    Years: 40.00    Total pack years: 40.00    Types: Cigarettes, E-cigarettes    Quit date: 09/23/2009    Years since quitting: 13.1   Smokeless tobacco: Never  Vaping Use   Vaping Use: Never used  Substance and Sexual Activity   Alcohol use: Not Currently   Drug use: No   Sexual activity: Not on file  Other Topics Concern   Not on file  Social History Narrative   Lives at home with wife, daughter and her 2 children.   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         Social Determinants of Health   Financial Resource Strain: Low Risk  (05/29/2021)   Overall Financial Resource Strain (CARDIA)    Difficulty of Paying Living Expenses: Not hard at all  Food Insecurity: No Food Insecurity (05/29/2021)   Hunger Vital Sign    Worried About Running Out of Food in the Last Year: Never true    Ran Out of Food in the Last Year: Never true  Transportation Needs: No Transportation Needs (05/29/2021)   PRAPARE - Hydrologist (Medical): No    Lack of  Transportation (Non-Medical): No  Physical Activity: Inactive (05/29/2021)   Exercise Vital Sign    Days of Exercise per Week: 0 days    Minutes of Exercise per Session: 0 min  Stress: No Stress Concern Present (05/29/2021)   [place]    Feeling of Stress : Only a little  Social Connections: Moderately Isolated (05/29/2021)   Social Connection and Isolation Panel [NHANES]    Frequency of Communication with Friends and Family: More than three times a week    Frequency of Social Gatherings with Friends and Family: More than three times a week    Attends Religious Services: Never    Marine scientist or Organizations: No    Attends Archivist Meetings: Never    Marital Status: Married  Human resources officer Violence: Not At Risk (05/29/2021)   Humiliation, Afraid, Rape, and Kick questionnaire    Fear of Current or Ex-Partner: No    Emotionally Abused: No    Physically Abused: No    Sexually Abused: No    Outpatient Medications Prior to Visit  Medication Sig Dispense Refill   amLODipine (NORVASC) 10 MG tablet TAKE 1 TABLET DAILY 90 tablet 3   hydrochlorothiazide (HYDRODIURIL) 25 MG tablet Take 1 tablet (25 mg total) by mouth daily. 30 tablet 0   labetalol (NORMODYNE) 100 MG tablet Take 1 and a 1/2 tablets twice a day 30 tablet 0   losartan (COZAAR) 50 MG tablet Take 2 tablets (100 mg total) by mouth daily. 90 tablet 1   hydrochlorothiazide (HYDRODIURIL) 25 MG tablet Take 1 tablet (25 mg total) by mouth daily. 90 tablet 3   No facility-administered medications prior to visit.    No Known Allergies  ROS     Objective:    Physical Exam Constitutional:      General: He is not in acute distress.    Appearance: Normal appearance.  HENT:     Head: Normocephalic and atraumatic.     Right Ear: External ear normal.     Left Ear: External ear normal.  Eyes:     Extraocular Movements: Extraocular movements  intact.     Pupils: Pupils are equal, round, and reactive to light.  Cardiovascular:     Rate and Rhythm: Normal rate and regular rhythm.     Heart sounds: Normal heart sounds. No murmur heard.    No gallop.     Comments: Manual blood pressure recheck 124/82 Pulmonary:     Effort: Pulmonary effort is normal. No respiratory distress.     Breath sounds: No wheezing or rales.  Skin:    General: Skin is warm.  Neurological:     Mental Status: He is alert.  Psychiatric:        Judgment: Judgment normal.     BP 124/82   Pulse 69   Temp 98.4 F (36.9 C) (Oral)   Resp 16   Wt 170 lb (77.1 kg)   SpO2 98%   BMI 26.63 kg/m  Wt Readings from Last 3 Encounters:  11/23/22 170 lb (77.1 kg)  11/11/22 171 lb 1.3 oz (77.6 kg)  11/02/22 174 lb (78.9 kg)       Assessment & Plan:  Primary hypertension Assessment & Plan: BP Readings from Last 3 Encounters:  11/23/22 124/82  11/11/22 (!) 152/78  11/02/22 (!) 150/90   BP is stable/improved.  Continue increased dose of losartan, hctz and labetalol. Obtain follow up bmet.   Orders: -     Basic metabolic panel    I, Nance Pear, NP, personally preformed the services described in this documentation.  All medical record entries made by the scribe were at my direction and in my presence.  I have reviewed the chart and discharge instructions (if applicable) and agree that the record reflects my personal performance and is accurate and complete. 11/23/2022  Lacretia Leigh as a scribe for Nance Pear, NP.,have documented all relevant documentation on the behalf of Nance Pear, NP,as directed by  Nance Pear, NP while in the presence of Nance Pear, NP.   Nance Pear, NP

## 2022-11-23 NOTE — Assessment & Plan Note (Signed)
BP Readings from Last 3 Encounters:  11/23/22 124/82  11/11/22 (!) 152/78  11/02/22 (!) 150/90   BP is stable/improved.  Continue increased dose of losartan, hctz and labetalol. Obtain follow up bmet.

## 2022-11-24 ENCOUNTER — Ambulatory Visit: Payer: Medicare Other | Attending: Cardiology

## 2022-11-24 ENCOUNTER — Ambulatory Visit: Payer: Medicare Other

## 2022-11-29 ENCOUNTER — Encounter: Payer: Self-pay | Admitting: Family

## 2023-01-07 ENCOUNTER — Telehealth: Payer: Self-pay | Admitting: *Deleted

## 2023-02-10 ENCOUNTER — Other Ambulatory Visit: Payer: Self-pay | Admitting: Family

## 2023-02-10 DIAGNOSIS — I10 Essential (primary) hypertension: Secondary | ICD-10-CM

## 2023-02-24 ENCOUNTER — Ambulatory Visit (INDEPENDENT_AMBULATORY_CARE_PROVIDER_SITE_OTHER): Payer: Medicare Other | Admitting: Family

## 2023-02-24 VITALS — BP 138/74 | HR 64 | Temp 98.0°F | Resp 18 | Ht 67.0 in | Wt 162.2 lb

## 2023-02-24 DIAGNOSIS — I712 Thoracic aortic aneurysm, without rupture, unspecified: Secondary | ICD-10-CM | POA: Diagnosis not present

## 2023-02-24 DIAGNOSIS — I2584 Coronary atherosclerosis due to calcified coronary lesion: Secondary | ICD-10-CM

## 2023-02-24 DIAGNOSIS — Z1211 Encounter for screening for malignant neoplasm of colon: Secondary | ICD-10-CM

## 2023-02-24 DIAGNOSIS — I2510 Atherosclerotic heart disease of native coronary artery without angina pectoris: Secondary | ICD-10-CM

## 2023-02-24 DIAGNOSIS — I10 Essential (primary) hypertension: Secondary | ICD-10-CM

## 2023-02-24 DIAGNOSIS — E782 Mixed hyperlipidemia: Secondary | ICD-10-CM | POA: Diagnosis not present

## 2023-02-24 LAB — LIPID PANEL
Cholesterol: 174 mg/dL (ref 0–200)
HDL: 44.5 mg/dL (ref >39.00)
LDL Cholesterol: 117 mg/dL — ABNORMAL HIGH (ref 0–99)
NonHDL: 129.46
Total CHOL/HDL Ratio: 4
Triglycerides: 60 mg/dL (ref 0.0–149.0)
VLDL: 12 mg/dL (ref 0.0–40.0)

## 2023-02-24 LAB — COMPREHENSIVE METABOLIC PANEL
ALT: 13 U/L (ref 0–53)
AST: 13 U/L (ref 0–37)
Albumin: 4.1 g/dL (ref 3.5–5.2)
Alkaline Phosphatase: 77 U/L (ref 39–117)
BUN: 22 mg/dL (ref 6–23)
CO2: 29 mEq/L (ref 19–32)
Calcium: 9.6 mg/dL (ref 8.4–10.5)
Chloride: 101 mEq/L (ref 96–112)
Creatinine, Ser: 1.03 mg/dL (ref 0.40–1.50)
GFR: 74.58 mL/min (ref >60.00)
Glucose, Bld: 97 mg/dL (ref 70–99)
Potassium: 4.6 mEq/L (ref 3.5–5.1)
Sodium: 137 mEq/L (ref 135–145)
Total Bilirubin: 0.8 mg/dL (ref 0.2–1.2)
Total Protein: 7.1 g/dL (ref 6.0–8.3)

## 2023-02-24 MED ORDER — LOSARTAN POTASSIUM 50 MG PO TABS: 100.0000 mg | ORAL_TABLET | Freq: Every day | ORAL | 1 refills | Status: DC

## 2023-02-24 MED ORDER — HYDROCHLOROTHIAZIDE 25 MG PO TABS: 25.0000 mg | ORAL_TABLET | Freq: Every day | ORAL | 1 refills | Status: DC

## 2023-02-24 MED ORDER — LABETALOL HCL 100 MG PO TABS: ORAL_TABLET | ORAL | 1 refills | Status: DC

## 2023-02-24 NOTE — Assessment & Plan Note (Signed)
Stress test was recommended by cardiology. He has not followed through on this.  I have advised him to follow back up with cardiology to have this arranged.

## 2023-02-24 NOTE — Assessment & Plan Note (Signed)
BP OK today, but was better when he was on losartan.  Will restart losartan.

## 2023-02-24 NOTE — Assessment & Plan Note (Signed)
>>  ASSESSMENT AND PLAN FOR ESSENTIAL HYPERTENSION WRITTEN ON 02/24/2023  7:57 AM BY O'SULLIVAN, Annetta Deiss, NP  BP OK today, but was better when he was on losartan .  Will restart losartan .

## 2023-02-24 NOTE — Progress Notes (Signed)
Subjective:     Patient ID: Matthew Lee, male    DOB: 1956/02/09, 67 y.o.   MRN: [id number]  Chief Complaint  Patient presents with   Follow-up    HPI Patient is in today for follow up.  HTN- maintained on losartan, amlodipine, labetalol and HCTZ. He ran out of losartan.   BP Readings from Last 3 Encounters:  02/24/23 138/74  11/23/22 124/82  11/11/22 (!) 152/78   CAD- incidental finding on CT. He is working on improving his diet.   Dilated ascending Aorta 4 cm.  Noted incidentally on CT chest 1/24.   Health Maintenance Due  Topic Date Due   Colonoscopy  Never done   COVID-19 Vaccine (3 - 2023-24 season) 05/29/2022    Past Medical History:  Diagnosis Date   Abnormal weight loss 11/02/2022   Aneurysm of thoracic aorta (HCC) 09/2022   dilated to 4 cm on CT   Cholelithiasis 11/02/2022   Colon cancer screening 07/31/2022   Coronary artery calcification 10/08/2022   Diverticulitis    Elevated PSA 07/31/2022   Essential hypertension 09/26/2009   Qualifier: Diagnosis of   By: Logan Bores MD, Governor Rooks bladder polyp 02/18/2015   Overview:   - 6 mm (as described from [place] abd in 2015). Repeat US abd lim ordered.   GERD (gastroesophageal reflux disease) 09/07/2013   Hepatitis C 08/24/2012   TX and cured per pt   History of COVID-19 07/31/2022   History of tobacco abuse    Hyperglycemia 11/22/2014   Hypertension    long standing since 2007, was treated for sometime, but then discontinued taking meds on his own.   Immunization counseling 04/01/2022   Insomnia 10/31/2009   Qualifier: Diagnosis of   By: Baltazar Apo MD, Amanjot       Mild diastolic dysfunction 08/22/2012   Preventative health care 12/31/2014   Pulmonary edema, acute (HCC)    hospitalized in 09/2009 with hypertensive crisis and acute pulmonary edema- improved with lasix nad labetalol.    Past Surgical History:  Procedure Laterality Date   ESOPHAGOGASTRODUODENOSCOPY  08/21/2011   Procedure:  ESOPHAGOGASTRODUODENOSCOPY (EGD);  Surgeon: Charna Elizabeth, MD;  Location: [facility];  Service: Endoscopy;  Laterality: N/A;   TONSILLECTOMY      Family History  Problem Relation Age of Onset   Polycystic kidney disease Father    Polycystic kidney disease Half-Brother    Polycystic kidney disease Half-Brother    Polycystic kidney disease Half-Sister    Colon cancer Neg Hx    Esophageal cancer Neg Hx    Stomach cancer Neg Hx     Social History   Socioeconomic History   Marital status: Married    Spouse name: Not on file   Number of children: Not on file   Years of education: Not on file   Highest education level: Associate degree: occupational, Scientist, product/process development, or vocational program  Occupational History   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   Occupation: Garment/textile technologist: [employer]  Tobacco Use   Smoking status: Former    Packs/day: 1.00    Years: 40.00    Additional pack years: 0.00    Total pack years: 40.00    Types: Cigarettes, E-cigarettes    Quit date: 09/23/2009    Years since quitting: 13.4   Smokeless tobacco: Never  Vaping Use   Vaping Use: Never used  Substance and Sexual Activity   Alcohol use: Not Currently  Drug use: No   Sexual activity: Not on file  Other Topics Concern   Not on file  Social History Narrative   Lives at home with wife, daughter and her 2 children.   Caffeine use:  1 daily   Works a Aeronautical engineer (Museum/gallery conservator)   Enjoys Optician, dispensing, Civil engineer, contracting.         Social Determinants of Health   Financial Resource Strain: Patient Declined (02/22/2023)   Overall Financial Resource Strain (CARDIA)    Difficulty of Paying Living Expenses: Patient declined  Food Insecurity: Patient Declined (02/22/2023)   Hunger Vital Sign    Worried About Running Out of Food in the Last Year: Patient declined    Ran Out of Food in the Last Year: Patient declined  Transportation Needs: No Transportation  Needs (02/22/2023)   PRAPARE - Administrator, Civil Service (Medical): No    Lack of Transportation (Non-Medical): No  Physical Activity: Sufficiently Active (02/22/2023)   Exercise Vital Sign    Days of Exercise per Week: 2 days    Minutes of Exercise per Session: 90 min  Stress: No Stress Concern Present (02/22/2023)   [employer] of Occupational Health - Occupational Stress Questionnaire    Feeling of Stress : Not at all  Social Connections: Unknown (02/22/2023)   Social Connection and Isolation Panel [NHANES]    Frequency of Communication with Friends and Family: Patient declined    Frequency of Social Gatherings with Friends and Family: Patient declined    Attends Religious Services: Patient declined    Database administrator or Organizations: No    Attends Engineer, structural: Not on file    Marital Status: Married  Catering manager Violence: Not At Risk (05/29/2021)   Humiliation, Afraid, Rape, and Kick questionnaire    Fear of Current or Ex-Partner: No    Emotionally Abused: No    Physically Abused: No    Sexually Abused: No    Outpatient Medications Prior to Visit  Medication Sig Dispense Refill   amLODipine (NORVASC) 10 MG tablet TAKE 1 TABLET DAILY 90 tablet 3   hydrochlorothiazide (HYDRODIURIL) 25 MG tablet Take 1 tablet (25 mg total) by mouth daily. 30 tablet 0   labetalol (NORMODYNE) 100 MG tablet Take 1 and a 1/2 tablets twice a day 30 tablet 0   losartan (COZAAR) 50 MG tablet Take 2 tablets (100 mg total) by mouth daily. 90 tablet 1   No facility-administered medications prior to visit.    No Known Allergies  ROS See HPI    Objective:    Physical Exam Constitutional:      General: He is not in acute distress.    Appearance: He is well-developed.  HENT:     Head: Normocephalic and atraumatic.  Cardiovascular:     Rate and Rhythm: Normal rate and regular rhythm.     Heart sounds: No murmur heard. Pulmonary:     Effort:  Pulmonary effort is normal. No respiratory distress.     Breath sounds: Normal breath sounds. No wheezing or rales.  Skin:    General: Skin is warm and dry.  Neurological:     Mental Status: He is alert and oriented to person, place, and time.  Psychiatric:        Behavior: Behavior normal.        Thought Content: Thought content normal.     BP 138/74   Pulse 64   Temp 98 F (36.7 C)  Resp 18   Ht 5\' 7"  (1.702 m)   Wt 162 lb 3.2 oz (73.6 kg)   SpO2 100%   BMI 25.40 kg/m  Wt Readings from Last 3 Encounters:  02/24/23 162 lb 3.2 oz (73.6 kg)  11/23/22 170 lb (77.1 kg)  11/11/22 171 lb 1.3 oz (77.6 kg)       Assessment & Plan:   Problem List Items Addressed This Visit       Unprioritized   Mixed dyslipidemia   Relevant Orders   Lipid panel   Essential hypertension    BP OK today, but was better when he was on losartan.  Will restart losartan.      Relevant Medications   losartan (COZAAR) 50 MG tablet   labetalol (NORMODYNE) 100 MG tablet   hydrochlorothiazide (HYDRODIURIL) 25 MG tablet   Other Relevant Orders   Comp Met (CMET)   Coronary artery calcification    Stress test was recommended by cardiology. He has not followed through on this.  I have advised him to follow back up with cardiology to have this arranged.       Relevant Medications   losartan (COZAAR) 50 MG tablet   labetalol (NORMODYNE) 100 MG tablet   hydrochlorothiazide (HYDRODIURIL) 25 MG tablet   Colon cancer screening    Patient is hesitant but ultimately agrees to completing colonoscopy.       Aneurysm of thoracic aorta (HCC)    Will need annual assessment.        Relevant Medications   losartan (COZAAR) 50 MG tablet   labetalol (NORMODYNE) 100 MG tablet   hydrochlorothiazide (HYDRODIURIL) 25 MG tablet   Other Visit Diagnoses     Screening for colon cancer    -  Primary   Relevant Orders   Ambulatory referral to Gastroenterology       I am having Claire Shown. Hitchcock maintain his  amLODipine, losartan, labetalol, and hydrochlorothiazide.  Meds ordered this encounter  Medications   losartan (COZAAR) 50 MG tablet    Sig: Take 2 tablets (100 mg total) by mouth daily.    Dispense:  90 tablet    Refill:  1    Order Specific Question:   Supervising Provider    Answer:   Danise Edge A [[id number]]   labetalol (NORMODYNE) 100 MG tablet    Sig: Take 1 and a 1/2 tablets twice a day    Dispense:  135 tablet    Refill:  1    S    Order Specific Question:   Supervising Provider    Answer:   Danise Edge A [[id number]]   hydrochlorothiazide (HYDRODIURIL) 25 MG tablet    Sig: Take 1 tablet (25 mg total) by mouth daily.    Dispense:  90 tablet    Refill:  1    Order Specific Question:   Supervising Provider    Answer:   Danise Edge A [[id number]]

## 2023-02-24 NOTE — Assessment & Plan Note (Signed)
Will need annual assessment.

## 2023-02-24 NOTE — Assessment & Plan Note (Signed)
Patient is hesitant but ultimately agrees to completing colonoscopy.

## 2023-07-19 ENCOUNTER — Other Ambulatory Visit: Payer: Self-pay | Admitting: Family

## 2023-07-21 ENCOUNTER — Encounter: Payer: Self-pay | Admitting: Family

## 2023-07-22 MED ORDER — LABETALOL HCL 100 MG PO TABS: 150.0000 mg | ORAL_TABLET | Freq: Two times a day (BID) | ORAL | 1 refills | Status: DC

## 2023-07-25 MED ORDER — LABETALOL HCL 100 MG PO TABS: 150.0000 mg | ORAL_TABLET | Freq: Two times a day (BID) | ORAL | 0 refills | Status: DC

## 2023-07-27 MED ORDER — LABETALOL HCL 100 MG PO TABS: 150.0000 mg | ORAL_TABLET | Freq: Two times a day (BID) | ORAL | 0 refills | Status: DC

## 2023-07-27 NOTE — Addendum Note (Signed)
Addended by: Sandford Craze on: 07/27/2023 09:58 AM   Modules accepted: Orders

## 2023-08-04 ENCOUNTER — Ambulatory Visit: Payer: Medicare Other | Admitting: Family

## 2023-10-03 ENCOUNTER — Telehealth: Payer: Self-pay | Admitting: Family

## 2023-10-03 DIAGNOSIS — I2510 Atherosclerotic heart disease of native coronary artery without angina pectoris: Secondary | ICD-10-CM

## 2023-10-03 NOTE — Telephone Encounter (Signed)
 See mychart.

## 2023-11-09 ENCOUNTER — Other Ambulatory Visit: Payer: Self-pay

## 2023-11-10 ENCOUNTER — Ambulatory Visit: Payer: Medicare Other | Admitting: Cardiology

## 2023-12-15 ENCOUNTER — Telehealth: Payer: Self-pay | Admitting: Family

## 2023-12-15 ENCOUNTER — Ambulatory Visit (INDEPENDENT_AMBULATORY_CARE_PROVIDER_SITE_OTHER): Admitting: Family

## 2023-12-15 VITALS — BP 122/66 | HR 81 | Temp 99.1°F | Resp 16 | Ht 67.0 in | Wt 156.0 lb

## 2023-12-15 DIAGNOSIS — E782 Mixed hyperlipidemia: Secondary | ICD-10-CM

## 2023-12-15 DIAGNOSIS — I10 Essential (primary) hypertension: Secondary | ICD-10-CM | POA: Diagnosis not present

## 2023-12-15 DIAGNOSIS — Z87891 Personal history of nicotine dependence: Secondary | ICD-10-CM

## 2023-12-15 DIAGNOSIS — Z1211 Encounter for screening for malignant neoplasm of colon: Secondary | ICD-10-CM

## 2023-12-15 DIAGNOSIS — R195 Other fecal abnormalities: Secondary | ICD-10-CM | POA: Diagnosis not present

## 2023-12-15 DIAGNOSIS — I712 Thoracic aortic aneurysm, without rupture, unspecified: Secondary | ICD-10-CM

## 2023-12-15 DIAGNOSIS — D649 Anemia, unspecified: Secondary | ICD-10-CM

## 2023-12-15 DIAGNOSIS — G4700 Insomnia, unspecified: Secondary | ICD-10-CM

## 2023-12-15 LAB — CBC WITH DIFFERENTIAL/PLATELET
Basophils Absolute: 0 10*3/uL (ref 0.0–0.1)
Basophils Relative: 0.4 % (ref 0.0–3.0)
Eosinophils Absolute: 0.1 10*3/uL (ref 0.0–0.7)
Eosinophils Relative: 0.7 % (ref 0.0–5.0)
HCT: 31.2 % — ABNORMAL LOW (ref 39.0–52.0)
Hemoglobin: 10.3 g/dL — ABNORMAL LOW (ref 13.0–17.0)
Lymphocytes Relative: 14.1 % (ref 12.0–46.0)
Lymphs Abs: 1.6 10*3/uL (ref 0.7–4.0)
MCHC: 32.9 g/dL (ref 30.0–36.0)
MCV: 85.9 fl (ref 78.0–100.0)
Monocytes Absolute: 1.1 10*3/uL — ABNORMAL HIGH (ref 0.1–1.0)
Monocytes Relative: 9.9 % (ref 3.0–12.0)
Neutro Abs: 8.3 10*3/uL — ABNORMAL HIGH (ref 1.4–7.7)
Neutrophils Relative %: 74.9 % (ref 43.0–77.0)
Platelets: 302 10*3/uL (ref 150.0–400.0)
RBC: 3.63 Mil/uL — ABNORMAL LOW (ref 4.22–5.81)
RDW: 14 % (ref 11.5–15.5)
WBC: 11 10*3/uL — ABNORMAL HIGH (ref 4.0–10.5)

## 2023-12-15 LAB — LIPID PANEL
Cholesterol: 172 mg/dL (ref 0–200)
HDL: 44 mg/dL (ref >39.00)
LDL Cholesterol: 112 mg/dL — ABNORMAL HIGH (ref 0–99)
NonHDL: 127.84
Total CHOL/HDL Ratio: 4
Triglycerides: 79 mg/dL (ref 0.0–149.0)
VLDL: 15.8 mg/dL (ref 0.0–40.0)

## 2023-12-15 MED ORDER — LOSARTAN POTASSIUM 50 MG PO TABS: 100.0000 mg | ORAL_TABLET | Freq: Every day | ORAL | 1 refills | Status: DC

## 2023-12-15 MED ORDER — HYDROCHLOROTHIAZIDE 25 MG PO TABS: 25.0000 mg | ORAL_TABLET | Freq: Every day | ORAL | 3 refills | Status: DC

## 2023-12-15 MED ORDER — LOSARTAN POTASSIUM 50 MG PO TABS
50.0000 mg | ORAL_TABLET | Freq: Two times a day (BID) | ORAL | 0 refills | Status: DC
Start: 2023-12-15 — End: 2024-01-04

## 2023-12-15 MED ORDER — LABETALOL HCL 100 MG PO TABS: 150.0000 mg | ORAL_TABLET | Freq: Two times a day (BID) | ORAL | 0 refills | Status: DC

## 2023-12-15 MED ORDER — ZOLPIDEM TARTRATE 5 MG PO TABS: 5.0000 mg | ORAL_TABLET | Freq: Every evening | ORAL | 0 refills | Status: DC | PRN

## 2023-12-15 MED ORDER — AMLODIPINE BESYLATE 10 MG PO TABS: ORAL_TABLET | ORAL | 3 refills | Status: AC

## 2023-12-15 NOTE — Progress Notes (Signed)
 Subjective:     Patient ID: Matthew Lee, male    DOB: [birth year], 68 y.o.   MRN: [id number]  Chief Complaint  Patient presents with   Hypertension    Here for follow up    HPI  Discussed the use of AI scribe software for clinical note transcription with the patient, who gave verbal consent to proceed.  History of Present Illness  Matthew Lee is a 68 year old male who presents with recent episodes of diarrhea and fainting.  He experienced a significant episode of diarrhea last Thursday night, accompanied by a sensation of fainting. The diarrhea was massive, and he actually fainted during the episode. The diarrhea was very dark in color for a couple of days prior to the episode but has since returned to normal. He is concerned about the possibility of an internal issue due to the dark stools. He has been feeling better since the episode, but not as quickly as he would like.  He has been out of losartan for two weeks, but his blood pressure remains stable. He is monitoring his blood pressure at home and has a machine to do so. He is surprised by the readings, as he expected them to be higher.  He has difficulty sleeping and has not found anything effective for sleep. He previously used Ambien, which he found helpful, and wants to use it again for a short period. He has also tried CBD for sleep without success. He slept poorly last night and had to take a half day of PTO to work from home.  He has a history of a 4 cm ascending thoracic aorta noted in a CT scan from January 2024, which is being monitored.  He is tobacco and alcohol-free and has been trying to improve his diet by reducing processed foods and incorporating more vegetables and olive oil. He acknowledges that he had been consuming more fatty and processed foods towards the end of last year, which he believes may have impacted his health. No swelling in his ankles.     Health Maintenance Due  Topic Date Due   Colonoscopy   Never done   DTaP/Tdap/Td (2 - Td or Tdap) 03/16/2023   INFLUENZA VACCINE  04/29/2023   COVID-19 Vaccine (3 - 2024-25 season) 05/30/2023   Medicare Annual Wellness (AWV)  06/03/2023   Lung Cancer Screening  10/08/2023    Past Medical History:  Diagnosis Date   Abnormal weight loss 11/02/2022   Aneurysm of thoracic aorta (HCC) 09/2022   dilated to 4 cm on CT   Cholelithiasis 11/02/2022   Colon cancer screening 07/31/2022   Coronary artery calcification 10/08/2022   Diverticulitis    Dyspnea on exertion 11/11/2022   Elevated PSA 07/31/2022   Essential hypertension 09/26/2009   Qualifier: Diagnosis of   By: Logan Bores MD, Gwen Her 11/11/2022   Gall bladder polyp 02/18/2015   Overview:   - 6 mm (as described from [place] abd in 2015). Repeat US abd lim ordered.   GERD (gastroesophageal reflux disease) 09/07/2013   Hepatitis C 08/24/2012   TX and cured per pt   History of COVID-19 07/31/2022   History of hepatitis C 11/11/2022   History of tobacco abuse    Hyperglycemia 11/22/2014   Hypertension    long standing since 2007, was treated for sometime, but then discontinued taking meds on his own.   Immunization counseling 04/01/2022   Insomnia 10/31/2009   Qualifier: Diagnosis of  ByBaltazar Apo MD, Amanjot       Mild diastolic dysfunction 08/22/2012   Mixed dyslipidemia 11/11/2022   Preventative health care 12/31/2014   Prominent abdominal aortic pulsation 11/11/2022   Pulmonary edema, acute (HCC)    hospitalized in 09/2009 with hypertensive crisis and acute pulmonary edema- improved with lasix nad labetalol.    Past Surgical History:  Procedure Laterality Date   ESOPHAGOGASTRODUODENOSCOPY  08/21/2011   Procedure: ESOPHAGOGASTRODUODENOSCOPY (EGD);  Surgeon: Charna Elizabeth, MD;  Location: [facility];  Service: Endoscopy;  Laterality: N/A;   TONSILLECTOMY      Family History  Problem Relation Age of Onset   Polycystic kidney disease Father    Polycystic kidney disease  Half-Brother    Polycystic kidney disease Half-Brother    Polycystic kidney disease Half-Sister    Colon cancer Neg Hx    Esophageal cancer Neg Hx    Stomach cancer Neg Hx     Social History   Socioeconomic History   Marital status: Married    Spouse name: Not on file   Number of children: Not on file   Years of education: Not on file   Highest education level: Some college, no degree  Occupational History   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   Occupation: Garment/textile technologist: [employer]  Tobacco Use   Smoking status: Former    Current packs/day: 0.00    Average packs/day: 1 pack/day for 40.0 years (40.0 ttl pk-yrs)    Types: Cigarettes, E-cigarettes    Start date: 09/23/1969    Quit date: 09/23/2009    Years since quitting: 14.2   Smokeless tobacco: Never  Vaping Use   Vaping status: Never Used  Substance and Sexual Activity   Alcohol use: Not Currently   Drug use: No   Sexual activity: Not on file  Other Topics Concern   Not on file  Social History Narrative   Lives at home with wife, daughter and her 2 children.   Caffeine use:  1 daily   Works a Aeronautical engineer (Museum/gallery conservator)   Enjoys Optician, dispensing, Civil engineer, contracting.         Social Drivers of Corporate investment banker Strain: Low Risk  (12/15/2023)   Overall Financial Resource Strain (CARDIA)    Difficulty of Paying Living Expenses: Not hard at all  Food Insecurity: Unknown (12/15/2023)   Hunger Vital Sign    Worried About Running Out of Food in the Last Year: Patient declined    Ran Out of Food in the Last Year: Never true  Transportation Needs: No Transportation Needs (12/15/2023)   PRAPARE - Administrator, Civil Service (Medical): No    Lack of Transportation (Non-Medical): No  Physical Activity: Sufficiently Active (12/15/2023)   Exercise Vital Sign    Days of Exercise per Week: 5 days    Minutes of Exercise per Session: 40 min  Stress: Stress  Concern Present (12/15/2023)   [employer] of Occupational Health - Occupational Stress Questionnaire    Feeling of Stress : To some extent  Social Connections: Socially Isolated (12/15/2023)   Social Connection and Isolation Panel [NHANES]    Frequency of Communication with Friends and Family: Once a week    Frequency of Social Gatherings with Friends and Family: Never    Attends Religious Services: Never    Database administrator or Organizations: No    Attends Banker Meetings: Not on file    Marital  Status: Married  Catering manager Violence: Not At Risk (05/29/2021)   Humiliation, Afraid, Rape, and Kick questionnaire    Fear of Current or Ex-Partner: No    Emotionally Abused: No    Physically Abused: No    Sexually Abused: No    Outpatient Medications Prior to Visit  Medication Sig Dispense Refill   amLODipine (NORVASC) 10 MG tablet TAKE 1 TABLET DAILY 90 tablet 3   hydrochlorothiazide (HYDRODIURIL) 25 MG tablet Take 25 mg by mouth daily.     labetalol (NORMODYNE) 100 MG tablet Take 1.5 tablets (150 mg total) by mouth in the morning and at bedtime. 21 tablet 0   losartan (COZAAR) 50 MG tablet Take 2 tablets (100 mg total) by mouth daily. 90 tablet 1   No facility-administered medications prior to visit.    No Known Allergies  ROS See HPI    Objective:    Physical Exam Constitutional:      General: He is not in acute distress.    Appearance: He is well-developed.  HENT:     Head: Normocephalic and atraumatic.  Cardiovascular:     Rate and Rhythm: Normal rate and regular rhythm.     Heart sounds: No murmur heard. Pulmonary:     Effort: Pulmonary effort is normal. No respiratory distress.     Breath sounds: Normal breath sounds. No wheezing or rales.  Musculoskeletal:     Right lower leg: 2+ Edema present.     Left lower leg: 2+ Edema present.  Skin:    General: Skin is warm and dry.  Neurological:     Mental Status: He is alert and oriented to  person, place, and time.  Psychiatric:        Behavior: Behavior normal.        Thought Content: Thought content normal.      BP 122/66 (BP Location: Right Arm, Patient Position: Sitting, Cuff Size: Small)   Pulse 81   Temp 99.1 F (37.3 C) (Oral)   Resp 16   Ht 5\' 7"  (1.702 m)   Wt 156 lb (70.8 kg)   SpO2 100%   BMI 24.43 kg/m  Wt Readings from Last 3 Encounters:  12/15/23 156 lb (70.8 kg)  02/24/23 162 lb 3.2 oz (73.6 kg)  11/23/22 170 lb (77.1 kg)       Assessment & Plan:   Problem List Items Addressed This Visit       Unprioritized   Mixed dyslipidemia   Lab Results  Component Value Date   CHOL 174 02/24/2023   HDL 44.50 02/24/2023   LDLCALC 117 (H) 02/24/2023   TRIG 60.0 02/24/2023   CHOLHDL 4 02/24/2023   Will update lipid panel. Not on statin.       Relevant Orders   Lipid panel   Insomnia   Chronic insomnia, previously managed with Ambien. Desires short-term use to re-establish sleep pattern. - Prescribe Ambien for 7-day course.      Relevant Medications   zolpidem (AMBIEN) 5 MG tablet   History of tobacco abuse   Relevant Orders   CT CHEST LUNG CA SCREEN LOW DOSE W/O CM   Essential hypertension   Hypertension, off losartan for two weeks. Blood pressure well-managed today. Concern for hypotension if losartan restarted. Advised home blood pressure monitoring and restart losartan if systolic exceeds 409 mmHg. - Monitor blood pressure at home. Restart losartan if systolic exceeds 811 mmHg.      Relevant Medications   amLODipine (NORVASC) 10 MG tablet  hydrochlorothiazide (HYDRODIURIL) 25 MG tablet   labetalol (NORMODYNE) 100 MG tablet   losartan (COZAAR) 50 MG tablet   losartan (COZAAR) 50 MG tablet   Aneurysm of thoracic aorta (HCC)   Will plan to re-evaluate on screening CT chest.      Relevant Medications   amLODipine (NORVASC) 10 MG tablet   hydrochlorothiazide (HYDRODIURIL) 25 MG tablet   labetalol (NORMODYNE) 100 MG tablet    losartan (COZAAR) 50 MG tablet   losartan (COZAAR) 50 MG tablet   Other Visit Diagnoses       Dark stools    -  Primary   Relevant Orders   CBC w/Diff   Fecal occult blood, imunochemical([facility])     Screening for colon cancer       Relevant Orders   Ambulatory referral to Gastroenterology       I have changed Claire Shown. Sabel's hydrochlorothiazide. I am also having him start on losartan and zolpidem. Additionally, I am having him maintain his amLODipine, labetalol, and losartan.  Meds ordered this encounter  Medications   amLODipine (NORVASC) 10 MG tablet    Sig: TAKE 1 TABLET DAILY    Dispense:  90 tablet    Refill:  3   hydrochlorothiazide (HYDRODIURIL) 25 MG tablet    Sig: Take 1 tablet (25 mg total) by mouth daily.    Dispense:  90 tablet    Refill:  3   labetalol (NORMODYNE) 100 MG tablet    Sig: Take 1.5 tablets (150 mg total) by mouth in the morning and at bedtime.    Dispense:  21 tablet    Refill:  0   losartan (COZAAR) 50 MG tablet    Sig: Take 2 tablets (100 mg total) by mouth daily.    Dispense:  90 tablet    Refill:  1   losartan (COZAAR) 50 MG tablet    Sig: Take 1 tablet (50 mg total) by mouth in the morning and at bedtime for 14 days.    Dispense:  28 tablet    Refill:  0   zolpidem (AMBIEN) 5 MG tablet    Sig: Take 1 tablet (5 mg total) by mouth at bedtime as needed for sleep.    Dispense:  7 tablet    Refill:  0    Supervising Provider:   Danise Edge A [[id number]]

## 2023-12-15 NOTE — Patient Instructions (Signed)
 VISIT SUMMARY:  Today, we discussed your recent episodes of diarrhea and fainting, your blood pressure management, sleep difficulties, and general health maintenance. We also reviewed your thoracic aortic aneurysm and the need for colorectal cancer screening.  YOUR PLAN:  -DIARRHEA WITH SYNCOPE: You experienced a significant episode of diarrhea and one episode of fainting, likely due to dehydration. The dark stools you noticed could indicate gastrointestinal bleeding. We are sending you home with a stool blood test kit to rule out any microscopic bleeding. Please let us know if you have any further issues with fainting.   -THORACIC AORTIC ANEURYSM: You have a 4 cm ascending thoracic aortic aneurysm, which is an enlargement of the aorta that needs to be monitored. We will order a lung cancer screening CT to evaluate your aorta.  -HYPERTENSION: Your blood pressure has been stable even though you have been off losartan for two weeks. Hypertension is high blood pressure, and we want you to monitor your blood pressure at home. Restart losartan if your systolic blood pressure exceeds 140 mmHg.  -INSOMNIA: You have chronic insomnia and have found Ambien helpful in the past. Insomnia is difficulty falling or staying asleep. We will prescribe Ambien for a 7-day course to help re-establish your sleep pattern.  -GENERAL HEALTH MAINTENANCE: We discussed improving your diet by reducing processed foods and animal fats, and increasing vegetables and olive oil. Limiting fast food to once a month was also advised.  -COLORECTAL CANCER SCREENING: You are due for a colonoscopy, which is a screening test for colorectal cancer. We will place a referral to [facility] for this procedure, and we have assured you that insurance will cover it.  INSTRUCTIONS:  Please visit the lab for blood work on your way out. Schedule a follow-up appointment in six months.

## 2023-12-15 NOTE — Telephone Encounter (Signed)
 Please advise pt that her lab work is showing anemia which likely related to the dark stools that he was experiencing.  I would definitely like for him to see GI, but I would like him to see them for an office visit before they schedule his colonoscopy.  If he develops recurrent dark stools or sees bright red blood in the stool or if he develops shortness of breath, he should go to the ER.  Let's repeat lab work on Friday.

## 2023-12-15 NOTE — Assessment & Plan Note (Signed)
 Chronic insomnia, previously managed with Ambien. Desires short-term use to re-establish sleep pattern. - Prescribe Ambien for 7-day course.

## 2023-12-15 NOTE — Telephone Encounter (Signed)
 Copied from CRM [id number]. Topic: Clinical - Request for Lab/Test Order >> Dec 15, 2023  4:07 PM Orinda Kenner C wrote: Reason for CRM: Casimiro Needle from [place] [phone] nurse reviewer line use the insurance number or policy number.  CT Chest scan, the facility is out of network, needs an in network. Please call back.

## 2023-12-15 NOTE — Assessment & Plan Note (Signed)
 Lab Results  Component Value Date   CHOL 174 02/24/2023   HDL 44.50 02/24/2023   LDLCALC 117 (H) 02/24/2023   TRIG 60.0 02/24/2023   CHOLHDL 4 02/24/2023   Will update lipid panel. Not on statin.

## 2023-12-15 NOTE — Assessment & Plan Note (Signed)
 Hypertension, off losartan for two weeks. Blood pressure well-managed today. Concern for hypotension if losartan restarted. Advised home blood pressure monitoring and restart losartan if systolic exceeds 409 mmHg. - Monitor blood pressure at home. Restart losartan if systolic exceeds 811 mmHg.

## 2023-12-15 NOTE — Assessment & Plan Note (Signed)
 Will plan to re-evaluate on screening CT chest.

## 2023-12-15 NOTE — Assessment & Plan Note (Deleted)
 BP Readings from Last 3 Encounters:  12/15/23 122/66  02/24/23 138/74  11/23/22 124/82   BP soft, advised pt to hold off on restarting losartan for now.

## 2023-12-15 NOTE — Assessment & Plan Note (Signed)
>>  ASSESSMENT AND PLAN FOR ESSENTIAL HYPERTENSION WRITTEN ON 12/15/2023  2:05 PM BY O'SULLIVAN, Pricsilla Lindvall, NP  Hypertension, off losartan  for two weeks. Blood pressure well-managed today. Concern for hypotension if losartan  restarted. Advised home blood pressure monitoring and restart losartan  if systolic exceeds 147 mmHg. - Monitor blood pressure at home. Restart losartan  if systolic exceeds 829 mmHg.

## 2023-12-16 NOTE — Telephone Encounter (Signed)
 Called back [facility] in network per insurance agent, insurance agent updated information and test approved,rorder # [id number] valid from 12/15/23 to 01/13/2024.   Not sure if anything else needs to be done.

## 2023-12-16 NOTE — Telephone Encounter (Signed)
Patient notified of results and provider's comments. He was scheduled to come in for labs tomorrow

## 2023-12-16 NOTE — Addendum Note (Signed)
 Addended by: Wilford Corner on: 12/16/2023 09:43 AM   Modules accepted: Orders

## 2023-12-17 ENCOUNTER — Encounter: Payer: Self-pay | Admitting: Family

## 2023-12-17 ENCOUNTER — Other Ambulatory Visit (INDEPENDENT_AMBULATORY_CARE_PROVIDER_SITE_OTHER)

## 2023-12-17 ENCOUNTER — Ambulatory Visit (HOSPITAL_BASED_OUTPATIENT_CLINIC_OR_DEPARTMENT_OTHER)
Admission: RE | Admit: 2023-12-17 | Discharge: 2023-12-17 | Disposition: A | Discharge status: Home / Self Care | Source: Ambulatory Visit | Attending: Family | Admitting: Family

## 2023-12-17 DIAGNOSIS — R140 Abdominal distension (gaseous): Secondary | ICD-10-CM

## 2023-12-17 DIAGNOSIS — K5900 Constipation, unspecified: Secondary | ICD-10-CM | POA: Diagnosis not present

## 2023-12-17 DIAGNOSIS — D649 Anemia, unspecified: Secondary | ICD-10-CM | POA: Diagnosis not present

## 2023-12-17 DIAGNOSIS — R1110 Vomiting, unspecified: Secondary | ICD-10-CM | POA: Diagnosis not present

## 2023-12-17 MED ORDER — ONDANSETRON HCL 4 MG PO TABS: 4.0000 mg | ORAL_TABLET | Freq: Three times a day (TID) | ORAL | 0 refills | Status: DC | PRN

## 2023-12-17 NOTE — Telephone Encounter (Signed)
 Patient coming now for labs - FYI.

## 2023-12-17 NOTE — Addendum Note (Signed)
 Addended by: Thelma Barge D on: 12/17/2023 10:03 AM   Modules accepted: Orders

## 2023-12-17 NOTE — Telephone Encounter (Signed)
 Spoke to patient.  Notes that he tried to eat once today and vomited, feels like there is no room in his stomach.  Last BM 2 days ago, notes abdominal distension.  Advised pt [facility] KUB now.  Rx sent for zofran.

## 2023-12-17 NOTE — Addendum Note (Signed)
 Addended by: Sandford Craze on: 12/17/2023 03:50 PM   Modules accepted: Orders

## 2023-12-17 NOTE — Telephone Encounter (Signed)
 noted

## 2023-12-18 ENCOUNTER — Telehealth: Payer: Self-pay | Admitting: Family

## 2023-12-18 DIAGNOSIS — D509 Iron deficiency anemia, unspecified: Secondary | ICD-10-CM | POA: Insufficient documentation

## 2023-12-18 LAB — IRON,TIBC AND FERRITIN PANEL
%SAT: 9 % — ABNORMAL LOW (ref 20–48)
Ferritin: 14 ng/mL — ABNORMAL LOW (ref 24–380)
Iron: 38 ug/dL — ABNORMAL LOW (ref 50–180)
TIBC: 410 ug/dL (ref 250–425)

## 2023-12-18 MED ORDER — FERROUS SULFATE 325 (65 FE) MG PO TABS: 325.0000 mg | ORAL_TABLET | ORAL | Status: DC

## 2023-12-18 NOTE — Telephone Encounter (Signed)
 Please advise pt that his iron level is low.  Add iron 325mg  otc one tablet by mouth every other day.

## 2023-12-18 NOTE — Telephone Encounter (Signed)
 Spoke to patient.  He reports that he has had several good BM's and is feeling much better. Stool is a normal brown color and he is eating and drinking.  I advised him to call GI tomorrow if he doesn't hear from them first to schedule his consult.

## 2023-12-20 ENCOUNTER — Encounter: Payer: Self-pay | Admitting: Family

## 2023-12-20 NOTE — Telephone Encounter (Signed)
Patient notified of results and recomendations

## 2023-12-21 ENCOUNTER — Other Ambulatory Visit: Payer: Self-pay | Admitting: Family

## 2023-12-21 DIAGNOSIS — D509 Iron deficiency anemia, unspecified: Secondary | ICD-10-CM

## 2023-12-23 ENCOUNTER — Ambulatory Visit: Payer: Medicare Other | Attending: Cardiology | Admitting: Cardiology

## 2023-12-23 ENCOUNTER — Encounter: Payer: Self-pay | Admitting: Cardiology

## 2023-12-23 VITALS — BP 110/60 | HR 72 | Ht 67.0 in | Wt 157.0 lb

## 2023-12-23 DIAGNOSIS — I2510 Atherosclerotic heart disease of native coronary artery without angina pectoris: Secondary | ICD-10-CM | POA: Diagnosis not present

## 2023-12-23 DIAGNOSIS — I10 Essential (primary) hypertension: Secondary | ICD-10-CM

## 2023-12-23 DIAGNOSIS — E782 Mixed hyperlipidemia: Secondary | ICD-10-CM | POA: Diagnosis not present

## 2023-12-23 DIAGNOSIS — I712 Thoracic aortic aneurysm, without rupture, unspecified: Secondary | ICD-10-CM | POA: Diagnosis not present

## 2023-12-23 MED ORDER — ATORVASTATIN CALCIUM 10 MG PO TABS: 10.0000 mg | ORAL_TABLET | Freq: Every day | ORAL | 3 refills | Status: AC

## 2023-12-23 NOTE — Progress Notes (Signed)
 Cardiology Office Note:    Date:  12/23/2023   ID:  Matthew Lee, DOB October 16, 1955, MRN [id number]  PCP:  Sandford Craze, NP  Cardiologist:  Garwin Brothers, MD   Referring MD: Sandford Craze, NP    ASSESSMENT:    1. Mixed dyslipidemia   2. Thoracic aortic aneurysm without rupture, unspecified part (HCC)   3. Coronary artery calcification   4. Essential hypertension    PLAN:    In order of problems listed above:  Coronary artery calcification: Secondary prevention stressed to the patient.  Importance of compliance with diet medication stressed and he vocalized understanding.  He was advised to walk at least half an hour a day on a daily basis and he agrees Mixed dyslipidemia: LDL goal less than 60.  I told him about lipid-lowering medications.  His LFTs have been normal in the past.  He is willing to try low-dose statin we will initiate him on atorvastatin 10 mg daily and liver lipid check in 6 weeks.  Benefits risks of medication explained and he vocalized understanding and questions were answered to satisfaction I reviewed blood work including LFTs normal Ascending aortic aneurysm: Stable and we will continue to monitor annually.  Symptoms were discussed with currently asymptomatic. Hypertension: Stable discussed blood pressure and diet with the patient.  Patient will be seen in follow-up appointment in 6 months or earlier if the patient has any concerns.   Medication Adjustments/Labs and Tests Ordered: Current medicines are reviewed at length with the patient today.  Concerns regarding medicines are outlined above.  Orders Placed This Encounter  Procedures   EKG 12-Lead   No orders of the defined types were placed in this encounter.    No chief complaint on file.    History of Present Illness:    Matthew Lee is a 68 y.o. male with past medical history of coronary artery calcification, and, essential hypertension, ascending aortic aneurysm and mixed  dyslipidemia.  He denies any problems at this time and takes care of activities of daily living.  No chest pain orthopnea or PND.  At the time of my evaluation, the patient is alert awake oriented and in no distress.  Past Medical History:  Diagnosis Date   Abnormal weight loss 11/02/2022   Aneurysm of thoracic aorta (HCC) 09/2022   dilated to 4 cm on CT   Cholelithiasis 11/02/2022   Colon cancer screening 07/31/2022   Coronary artery calcification 10/08/2022   Diverticulitis    Dyspnea on exertion 11/11/2022   Elevated PSA 07/31/2022   Essential hypertension 09/26/2009   Qualifier: Diagnosis of   By: Logan Bores MD, Gwen Her 11/11/2022   Gall bladder polyp 02/18/2015   Overview:   - 6 mm (as described from [place] abd in 2015). Repeat US abd lim ordered.   GERD (gastroesophageal reflux disease) 09/07/2013   Hepatitis C 08/24/2012   TX and cured per pt   History of COVID-19 07/31/2022   History of hepatitis C 11/11/2022   History of tobacco abuse    Hyperglycemia 11/22/2014   Hypertension    long standing since 2007, was treated for sometime, but then discontinued taking meds on his own.   Immunization counseling 04/01/2022   Insomnia 10/31/2009   Qualifier: Diagnosis of   By: Baltazar Apo MD, Amanjot       Mild diastolic dysfunction 08/22/2012   Mixed dyslipidemia 11/11/2022   Preventative health care 12/31/2014   Prominent abdominal aortic pulsation 11/11/2022  Pulmonary edema, acute (HCC)    hospitalized in 09/2009 with hypertensive crisis and acute pulmonary edema- improved with lasix nad labetalol.    Past Surgical History:  Procedure Laterality Date   ESOPHAGOGASTRODUODENOSCOPY  08/21/2011   Procedure: ESOPHAGOGASTRODUODENOSCOPY (EGD);  Surgeon: Charna Elizabeth, MD;  Location: [facility];  Service: Endoscopy;  Laterality: N/A;   TONSILLECTOMY      Current Medications: Current Meds  Medication Sig   amLODipine (NORVASC) 10 MG tablet TAKE 1 TABLET DAILY   ferrous  sulfate 325 (65 FE) MG tablet Take 1 tablet (325 mg total) by mouth every other day.   hydrochlorothiazide (HYDRODIURIL) 25 MG tablet Take 1 tablet (25 mg total) by mouth daily.   labetalol (NORMODYNE) 100 MG tablet Take 1.5 tablets (150 mg total) by mouth in the morning and at bedtime.   losartan (COZAAR) 50 MG tablet Take 2 tablets (100 mg total) by mouth daily.   losartan (COZAAR) 50 MG tablet Take 1 tablet (50 mg total) by mouth in the morning and at bedtime for 14 days.   ondansetron (ZOFRAN) 4 MG tablet Take 1 tablet (4 mg total) by mouth every 8 (eight) hours as needed for nausea or vomiting.   zolpidem (AMBIEN) 5 MG tablet Take 1 tablet (5 mg total) by mouth at bedtime as needed for sleep.     Allergies:   Patient has no known allergies.   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   Occupation: Garment/textile technologist: [employer]  Tobacco Use   Smoking status: Former    Current packs/day: 0.00    Average packs/day: 1 pack/day for 40.0 years (40.0 ttl pk-yrs)    Types: Cigarettes, E-cigarettes    Start date: 09/23/1969    Quit date: 09/23/2009    Years since quitting: 14.2   Smokeless tobacco: Never  Vaping Use   Vaping status: Never Used  Substance and Sexual Activity   Alcohol use: Not Currently   Drug use: No   Sexual activity: Not on file  Other Topics Concern   Not on file  Social History Narrative   Lives at home with wife, daughter and her 2 children.   Caffeine use:  1 daily   Works a Aeronautical engineer (Museum/gallery conservator)   Enjoys Optician, dispensing, Civil engineer, contracting.         Social Drivers of Corporate investment banker Strain: Low Risk  (12/15/2023)   Overall Financial Resource Strain (CARDIA)     Difficulty of Paying Living Expenses: Not hard at all  Food Insecurity: Unknown (12/15/2023)   Hunger Vital Sign    Worried About Running Out of Food in the Last Year: Patient declined    Ran Out of Food in the Last Year: Never true  Transportation Needs: No Transportation Needs (12/15/2023)   PRAPARE - Administrator, Civil Service (Medical): No    Lack of Transportation (Non-Medical): No  Physical Activity: Sufficiently Active (12/15/2023)   Exercise Vital Sign    Days of Exercise per Week: 5 days    Minutes of Exercise per Session: 40 min  Stress: Stress Concern Present (12/15/2023)   [employer] of Occupational Health - Occupational Stress Questionnaire  Feeling of Stress : To some extent  Social Connections: Socially Isolated (12/15/2023)   Social Connection and Isolation Panel [NHANES]    Frequency of Communication with Friends and Family: Once a week    Frequency of Social Gatherings with Friends and Family: Never    Attends Religious Services: Never    Database administrator or Organizations: No    Attends Engineer, structural: Not on file    Marital Status: Married     Family History: The patient's family history includes Polycystic kidney disease in his father, half-brother, half-brother, and half-sister. There is no history of Colon cancer, Esophageal cancer, or Stomach cancer.  ROS:   Please see the history of present illness.    All other systems reviewed and are negative.  EKGs/Labs/Other Studies Reviewed:    The following studies were reviewed today: I discussed my findings with the patient at length   Recent Labs: 02/24/2023: ALT 13; BUN 22; Creatinine, Ser 1.03; Potassium 4.6; Sodium 137 12/15/2023: Hemoglobin 10.3; Platelets 302.0  Recent Lipid Panel    Component Value Date/Time   CHOL 172 12/15/2023 1201   TRIG 79.0 12/15/2023 1201   HDL 44.00 12/15/2023 1201   CHOLHDL 4 12/15/2023 1201   VLDL 15.8 12/15/2023 1201   LDLCALC 112  (H) 12/15/2023 1201    Physical Exam:    VS:  BP 110/60   Pulse 72   Ht 5\' 7"  (1.702 m)   Wt 157 lb 0.6 oz (71.2 kg)   SpO2 95%   BMI 24.60 kg/m     Wt Readings from Last 3 Encounters:  12/23/23 157 lb 0.6 oz (71.2 kg)  12/15/23 156 lb (70.8 kg)  02/24/23 162 lb 3.2 oz (73.6 kg)     GEN: Patient is in no acute distress HEENT: Normal NECK: No JVD; No carotid bruits LYMPHATICS: No lymphadenopathy CARDIAC: Hear sounds regular, 2/6 systolic murmur at the apex. RESPIRATORY:  Clear to auscultation without rales, wheezing or rhonchi  ABDOMEN: Soft, non-tender, non-distended MUSCULOSKELETAL:  No edema; No deformity  SKIN: Warm and dry NEUROLOGIC:  Alert and oriented x 3 PSYCHIATRIC:  Normal affect   Signed, Garwin Brothers, MD  12/23/2023 2:22 PM     [facility]

## 2023-12-23 NOTE — Patient Instructions (Signed)
 Medication Instructions:  Your physician has recommended you make the following change in your medication:   Start Atorvastatin 10 mg daily  *If you need a refill on your cardiac medications before your next appointment, please call your pharmacy*   Lab Work: Your physician recommends that you return for lab work in: CMP and lipids in 6 weeks You need to have labs done when you are fasting.  You can come Monday through Friday 8:30 am to 12:00 pm and 1:15 to 4:30. You do not need to make an appointment as the order has already been placed.  If you have labs (blood work) drawn today and your tests are completely normal, you will receive your results only by: MyChart Message (if you have MyChart) OR A paper copy in the mail If you have any lab test that is abnormal or we need to change your treatment, we will call you to review the results.   Testing/Procedures: None ordered   Follow-Up: At [facility], you and your health needs are our priority.  As part of our continuing mission to provide you with exceptional heart care, we have created designated Provider Care Teams.  These Care Teams include your primary Cardiologist (physician) and Advanced Practice Providers (APPs -  Physician Assistants and Nurse Practitioners) who all work together to provide you with the care you need, when you need it.  We recommend signing up for the patient portal called "MyChart".  Sign up information is provided on this After Visit Summary.  MyChart is used to connect with patients for Virtual Visits (Telemedicine).  Patients are able to view lab/test results, encounter notes, upcoming appointments, etc.  Non-urgent messages can be sent to your provider as well.   To learn more about what you can do with MyChart, go to [email or web address].    Your next appointment:   12 month(s)  The format for your next appointment:   In Person  Provider:   Belva Crome, MD    Other  Instructions none  Important Information About Sugar

## 2023-12-27 DIAGNOSIS — Z87891 Personal history of nicotine dependence: Secondary | ICD-10-CM | POA: Diagnosis not present

## 2023-12-28 DIAGNOSIS — C169 Malignant neoplasm of stomach, unspecified: Secondary | ICD-10-CM

## 2023-12-28 HISTORY — DX: Malignant neoplasm of stomach, unspecified: C16.9

## 2023-12-31 ENCOUNTER — Ambulatory Visit: Admitting: Family

## 2024-01-03 NOTE — Progress Notes (Unsigned)
 Chief Complaint: Primary GI MD: Gentry Fitz  HPI: 68 year old male history of coronary artery calcification, hypertension, AAA, mixed dyslipidemia, chronic HCV infection genotype Ia s/p treatment, gallbladder polyp, and others as listed below presents for evaluation of  Previous patient of [facility] last seen May 2016.  Patient had extensive workup August 2015 for history of chronic HCV infection treated with Delmar Landau plus Ribavarin 800 Mg/day with workup showing moderate liver fibrosis with score of F2.  Abdominal ultrasound 04/2014: No focal lesions.  Gallbladder polyp measuring 6 mm.  No ascites.  Normal liver contour and size  Abdominal ultrasound 02/2015 showed stable gallbladder polyp (measuring 5.5 mm).  Thought to be benign polyp due to stability.  Mild hepatomegaly (measuring 18.8 cm)  CT abdomen pelvis with contrast 12/2017 done for diverticulitis found on CT scan 2 weeks prior: Recurrent diverticulitis with positive response to treatment with no residual inflammation, abscess, perforation.  Gallbladder not mentioned in the CT scan.  Previous CT scan mentioned normal gallbladder without wall thickening or biliary dilation   Discussed the use of AI scribe software for clinical note transcription with the patient, who gave verbal consent to proceed.  History of Present Illness      PREVIOUS GI WORKUP     Past Medical History:  Diagnosis Date   Abnormal weight loss 11/02/2022   Aneurysm of thoracic aorta (HCC) 09/2022   dilated to 4 cm on CT   Cholelithiasis 11/02/2022   Colon cancer screening 07/31/2022   Coronary artery calcification 10/08/2022   Diverticulitis    Dyspnea on exertion 11/11/2022   Elevated PSA 07/31/2022   Essential hypertension 09/26/2009   Qualifier: Diagnosis of   By: Logan Bores MD, Gwen Her 11/11/2022   Gall bladder polyp 02/18/2015   Overview:   - 6 mm (as described from [place] abd in 2015). Repeat US abd lim  ordered.   GERD (gastroesophageal reflux disease) 09/07/2013   Hepatitis C 08/24/2012   TX and cured per pt   History of COVID-19 07/31/2022   History of hepatitis C 11/11/2022   History of tobacco abuse    Hyperglycemia 11/22/2014   Hypertension    long standing since 2007, was treated for sometime, but then discontinued taking meds on his own.   Immunization counseling 04/01/2022   Insomnia 10/31/2009   Qualifier: Diagnosis of   By: Baltazar Apo MD, Amanjot       Mild diastolic dysfunction 08/22/2012   Mixed dyslipidemia 11/11/2022   Preventative health care 12/31/2014   Prominent abdominal aortic pulsation 11/11/2022   Pulmonary edema, acute (HCC)    hospitalized in 09/2009 with hypertensive crisis and acute pulmonary edema- improved with lasix nad labetalol.    Past Surgical History:  Procedure Laterality Date   ESOPHAGOGASTRODUODENOSCOPY  08/21/2011   Procedure: ESOPHAGOGASTRODUODENOSCOPY (EGD);  Surgeon: Charna Elizabeth, MD;  Location: [facility];  Service: Endoscopy;  Laterality: N/A;   TONSILLECTOMY      Current Outpatient Medications  Medication Sig Dispense Refill   amLODipine (NORVASC) 10 MG tablet TAKE 1 TABLET DAILY 90 tablet 3   atorvastatin (LIPITOR) 10 MG tablet Take 1 tablet (10 mg total) by mouth daily. 90 tablet 3   ferrous sulfate 325 (65 FE) MG tablet Take 1 tablet (325 mg total) by mouth every other day.     hydrochlorothiazide (HYDRODIURIL) 25 MG tablet Take 1 tablet (25 mg total) by mouth daily. 90 tablet 3   labetalol (NORMODYNE) 100 MG tablet Take  1.5 tablets (150 mg total) by mouth in the morning and at bedtime. 21 tablet 0   losartan (COZAAR) 50 MG tablet Take 2 tablets (100 mg total) by mouth daily. 90 tablet 1   losartan (COZAAR) 50 MG tablet Take 1 tablet (50 mg total) by mouth in the morning and at bedtime for 14 days. 28 tablet 0   ondansetron (ZOFRAN) 4 MG tablet Take 1 tablet (4 mg total) by mouth every 8 (eight) hours as needed for nausea or vomiting.  20 tablet 0   zolpidem (AMBIEN) 5 MG tablet Take 1 tablet (5 mg total) by mouth at bedtime as needed for sleep. 7 tablet 0   No current facility-administered medications for this visit.    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   Occupation: Garment/textile technologist: [employer]  Tobacco Use   Smoking status: Former    Current packs/day: 0.00    Average packs/day: 1 pack/day for 40.0 years (40.0 ttl pk-yrs)    Types: Cigarettes, E-cigarettes    Start date: 09/23/1969    Quit date: 09/23/2009    Years since quitting: 14.2   Smokeless tobacco: Never  Vaping Use   Vaping status: Never Used  Substance and Sexual Activity   Alcohol use: Not Currently   Drug use: No   Sexual activity: Not on file  Other Topics Concern   Not on file  Social History Narrative   Lives at home with wife, daughter and her 2 children.   Caffeine use:  1 daily   Works a Aeronautical engineer (Museum/gallery conservator)   Enjoys Optician, dispensing, Civil engineer, contracting.         Social Drivers of Corporate investment banker Strain: Low Risk  (12/15/2023)   Overall Financial Resource Strain (CARDIA)    Difficulty of Paying Living Expenses: Not hard at all  Food Insecurity: Unknown (12/15/2023)   Hunger Vital Sign    Worried About Running Out of Food in the Last Year: Patient declined    Ran Out of Food in the Last  Year: Never true  Transportation Needs: No Transportation Needs (12/15/2023)   PRAPARE - Administrator, Civil Service (Medical): No    Lack of Transportation (Non-Medical): No  Physical Activity: Sufficiently Active (12/15/2023)   Exercise Vital Sign    Days of Exercise per Week: 5 days    Minutes of Exercise per Session: 40 min  Stress: Stress Concern Present (12/15/2023)   [employer] of Occupational Health - Occupational Stress Questionnaire    Feeling of Stress : To some extent  Social Connections: Socially Isolated (12/15/2023)   Social Connection and Isolation Panel [NHANES]    Frequency of Communication with Friends and Family: Once a week    Frequency of  Social Gatherings with Friends and Family: Never    Attends Religious Services: Never    Database administrator or Organizations: No    Attends Engineer, structural: Not on file    Marital Status: Married  Catering manager Violence: Not At Risk (05/29/2021)   Humiliation, Afraid, Rape, and Kick questionnaire    Fear of Current or Ex-Partner: No    Emotionally Abused: No    Physically Abused: No    Sexually Abused: No    Review of Systems:    Constitutional: No weight loss, fever, chills, weakness or fatigue HEENT: Eyes: No change in vision               Ears, Nose, Throat:  No change in hearing or congestion Skin: No Sullivant or itching Cardiovascular: No chest pain, chest pressure or palpitations   Respiratory: No SOB or cough Gastrointestinal: See HPI and otherwise negative Genitourinary: No dysuria or change in urinary frequency Neurological: No headache, dizziness or syncope Musculoskeletal: No new muscle or joint pain Hematologic: No bleeding or bruising Psychiatric: No history of depression or anxiety    Physical Exam:  Vital signs: There were no vitals taken for this visit.  Constitutional: NAD, Well developed, Well nourished, alert and cooperative Head:  Normocephalic and  atraumatic. Eyes:   PEERL, EOMI. No icterus. Conjunctiva pink. Respiratory: Respirations even and unlabored. Lungs clear to auscultation bilaterally.   No wheezes, crackles, or rhonchi.  Cardiovascular:  Regular rate and rhythm. No peripheral edema, cyanosis or pallor.  Gastrointestinal:  Soft, nondistended, nontender. No rebound or guarding. Normal bowel sounds. No appreciable masses or hepatomegaly. Rectal:  Not performed.  Msk:  Symmetrical without gross deformities. Without edema, no deformity or joint abnormality.  Neurologic:  Alert and  oriented x4;  grossly normal neurologically.  Skin:   Dry and intact without significant lesions or rashes. Psychiatric: Oriented to person, place and time. Demonstrates good judgement and reason without abnormal affect or behaviors.  Physical Exam    RELEVANT LABS AND IMAGING: CBC    Component Value Date/Time   WBC 11.0 (H) 12/15/2023 1201   RBC 3.63 (L) 12/15/2023 1201   HGB 10.3 (L) 12/15/2023 1201   HCT 31.2 (L) 12/15/2023 1201   PLT 302.0 12/15/2023 1201   MCV 85.9 12/15/2023 1201   MCH 27.6 01/21/2018 1500   MCHC 32.9 12/15/2023 1201   RDW 14.0 12/15/2023 1201   LYMPHSABS 1.6 12/15/2023 1201   MONOABS 1.1 (H) 12/15/2023 1201   EOSABS 0.1 12/15/2023 1201   BASOSABS 0.0 12/15/2023 1201    CMP     Component Value Date/Time   NA 137 02/24/2023 0754   K 4.6 02/24/2023 0754   CL 101 02/24/2023 0754   CO2 29 02/24/2023 0754   GLUCOSE 97 02/24/2023 0754   BUN 22 02/24/2023 0754   CREATININE 1.03 02/24/2023 0754   CREATININE 0.85 01/21/2018 1500   CALCIUM 9.6 02/24/2023 0754   PROT 7.1 02/24/2023 0754   ALBUMIN 4.1 02/24/2023 0754   AST 13 02/24/2023 0754   ALT 13 02/24/2023 0754   ALKPHOS 77 02/24/2023 0754   BILITOT 0.8 02/24/2023 0754   GFRNONAA 88 01/24/2014 0852   GFRAA >89 01/24/2014 0852     Assessment/Plan:   Assessment and Plan Assessment & Plan        Lara Mulch [facility]  Gastroenterology 01/03/2024, 8:35 PM  Cc: Sandford Craze, NP

## 2024-01-04 ENCOUNTER — Other Ambulatory Visit (INDEPENDENT_AMBULATORY_CARE_PROVIDER_SITE_OTHER)

## 2024-01-04 ENCOUNTER — Ambulatory Visit: Admitting: Gastroenterology

## 2024-01-04 ENCOUNTER — Encounter: Payer: Self-pay | Admitting: Gastroenterology

## 2024-01-04 VITALS — BP 134/80 | HR 76 | Ht 67.0 in | Wt 155.0 lb

## 2024-01-04 DIAGNOSIS — D649 Anemia, unspecified: Secondary | ICD-10-CM

## 2024-01-04 DIAGNOSIS — K921 Melena: Secondary | ICD-10-CM

## 2024-01-04 DIAGNOSIS — K5900 Constipation, unspecified: Secondary | ICD-10-CM

## 2024-01-04 DIAGNOSIS — R140 Abdominal distension (gaseous): Secondary | ICD-10-CM

## 2024-01-04 DIAGNOSIS — Z8619 Personal history of other infectious and parasitic diseases: Secondary | ICD-10-CM

## 2024-01-04 DIAGNOSIS — Z8719 Personal history of other diseases of the digestive system: Secondary | ICD-10-CM

## 2024-01-04 LAB — COMPREHENSIVE METABOLIC PANEL WITH GFR
ALT: 16 U/L (ref 0–53)
AST: 20 U/L (ref 0–37)
Albumin: 4.2 g/dL (ref 3.5–5.2)
Alkaline Phosphatase: 94 U/L (ref 39–117)
BUN: 17 mg/dL (ref 6–23)
CO2: 25 meq/L (ref 19–32)
Calcium: 9.2 mg/dL (ref 8.4–10.5)
Chloride: 102 meq/L (ref 96–112)
Creatinine, Ser: 0.89 mg/dL (ref 0.40–1.50)
GFR: 87.45 mL/min (ref >60.00)
Glucose, Bld: 105 mg/dL — ABNORMAL HIGH (ref 70–99)
Potassium: 4.1 meq/L (ref 3.5–5.1)
Sodium: 137 meq/L (ref 135–145)
Total Bilirubin: 0.6 mg/dL (ref 0.2–1.2)
Total Protein: 6.9 g/dL (ref 6.0–8.3)

## 2024-01-04 LAB — IBC + FERRITIN
Ferritin: 8.6 ng/mL — ABNORMAL LOW (ref 22.0–322.0)
Iron: 393 ug/dL — ABNORMAL HIGH (ref 42–165)
Saturation Ratios: 86.4 % — ABNORMAL HIGH (ref 20.0–50.0)
TIBC: 455 ug/dL — ABNORMAL HIGH (ref 250.0–450.0)
Transferrin: 325 mg/dL (ref 212.0–360.0)

## 2024-01-04 LAB — CBC WITH DIFFERENTIAL/PLATELET
Basophils Absolute: 0 10*3/uL (ref 0.0–0.1)
Basophils Relative: 0.5 % (ref 0.0–3.0)
Eosinophils Absolute: 0.1 10*3/uL (ref 0.0–0.7)
Eosinophils Relative: 1.9 % (ref 0.0–5.0)
HCT: 34.9 % — ABNORMAL LOW (ref 39.0–52.0)
Hemoglobin: 11.4 g/dL — ABNORMAL LOW (ref 13.0–17.0)
Lymphocytes Relative: 16.2 % (ref 12.0–46.0)
Lymphs Abs: 1.2 10*3/uL (ref 0.7–4.0)
MCHC: 32.7 g/dL (ref 30.0–36.0)
MCV: 81.8 fl (ref 78.0–100.0)
Monocytes Absolute: 0.8 10*3/uL (ref 0.1–1.0)
Monocytes Relative: 10.9 % (ref 3.0–12.0)
Neutro Abs: 5.4 10*3/uL (ref 1.4–7.7)
Neutrophils Relative %: 70.5 % (ref 43.0–77.0)
Platelets: 281 10*3/uL (ref 150.0–400.0)
RBC: 4.27 Mil/uL (ref 4.22–5.81)
RDW: 14.4 % (ref 11.5–15.5)
WBC: 7.6 10*3/uL (ref 4.0–10.5)

## 2024-01-04 LAB — B12 AND FOLATE PANEL
Folate: >25.2 ng/mL (ref >5.9)
Vitamin B-12: 465 pg/mL (ref 211–911)

## 2024-01-04 MED ORDER — PANTOPRAZOLE SODIUM 40 MG PO TBEC: 40.0000 mg | DELAYED_RELEASE_TABLET | Freq: Two times a day (BID) | ORAL | 2 refills | Status: DC

## 2024-01-04 MED ORDER — NA SULFATE-K SULFATE-MG SULF 17.5-3.13-1.6 GM/177ML PO SOLN
1.0000 | Freq: Once | ORAL | 0 refills | Status: AC
Start: 2024-01-04 — End: 2024-01-04

## 2024-01-04 NOTE — Patient Instructions (Signed)
 Your provider has requested that you go to the basement level for lab work before leaving today. Press "B" on the elevator. The lab is located at the first door on the left as you exit the elevator.  You have been scheduled for an endoscopy and colonoscopy. Please follow the written instructions given to you at your visit today.  We have sent the following medications to your pharmacy for you to pick up at your convenience: Pantoprazole 40mg  twice daily.   If you use inhalers (even only as needed), please bring them with you on the day of your procedure.  DO NOT TAKE 7 DAYS PRIOR TO TEST- Trulicity (dulaglutide) Ozempic, Wegovy (semaglutide) Mounjaro (tirzepatide) Bydureon Bcise (exanatide extended release)  DO NOT TAKE 1 DAY PRIOR TO YOUR TEST Rybelsus (semaglutide) Adlyxin (lixisenatide) Victoza (liraglutide) Byetta (exanatide) ___________________________________________________________________________  Due to recent changes in healthcare laws, you may see the results of your imaging and laboratory studies on MyChart before your provider has had a chance to review them.  We understand that in some cases there may be results that are confusing or concerning to you. Not all laboratory results come back in the same time frame and the provider may be waiting for multiple results in order to interpret others.  Please give [place] 48 hours in order for your provider to thoroughly review all the results before contacting the office for clarification of your results.   _______________________________________________________  If your blood pressure at your visit was 140/90 or greater, please contact your primary care physician to follow up on this.  _______________________________________________________  If you are age 37 or older, your body mass index should be between 23-30. Your Body mass index is 24.28 kg/m. If this is out of the aforementioned range listed, please consider follow up with your  Primary Care Provider.  If you are age 52 or younger, your body mass index should be between 19-25. Your Body mass index is 24.28 kg/m. If this is out of the aformentioned range listed, please consider follow up with your Primary Care Provider.   ________________________________________________________  The [facility] providers would like to encourage you to use [facility] to communicate with providers for non-urgent requests or questions.  Due to long hold times on the telephone, sending your provider a message by [facility] may be a faster and more efficient way to get a response.  Please allow 48 business hours for a response.  Please remember that this is for non-urgent requests.  _______________________________________________________  Thank you for trusting me with your gastrointestinal care!   Boone Master, PA

## 2024-01-20 ENCOUNTER — Other Ambulatory Visit: Payer: Self-pay | Admitting: *Deleted

## 2024-01-20 ENCOUNTER — Encounter: Payer: Self-pay | Admitting: Internal Medicine

## 2024-01-20 ENCOUNTER — Ambulatory Visit: Admitting: Internal Medicine

## 2024-01-20 ENCOUNTER — Telehealth: Payer: Self-pay | Admitting: *Deleted

## 2024-01-20 VITALS — BP 125/89 | HR 75 | Temp 98.4°F | Resp 12 | Ht 67.0 in | Wt 155.0 lb

## 2024-01-20 DIAGNOSIS — D120 Benign neoplasm of cecum: Secondary | ICD-10-CM

## 2024-01-20 DIAGNOSIS — K5730 Diverticulosis of large intestine without perforation or abscess without bleeding: Secondary | ICD-10-CM

## 2024-01-20 DIAGNOSIS — D490 Neoplasm of unspecified behavior of digestive system: Secondary | ICD-10-CM

## 2024-01-20 DIAGNOSIS — K2289 Other specified disease of esophagus: Secondary | ICD-10-CM

## 2024-01-20 DIAGNOSIS — C160 Malignant neoplasm of cardia: Secondary | ICD-10-CM | POA: Diagnosis not present

## 2024-01-20 DIAGNOSIS — K3189 Other diseases of stomach and duodenum: Secondary | ICD-10-CM

## 2024-01-20 DIAGNOSIS — D509 Iron deficiency anemia, unspecified: Secondary | ICD-10-CM

## 2024-01-20 DIAGNOSIS — K921 Melena: Secondary | ICD-10-CM

## 2024-01-20 MED ORDER — SODIUM CHLORIDE 0.9 % IV SOLN: 500.0000 mL | Freq: Once | INTRAVENOUS | Status: DC

## 2024-01-20 NOTE — Patient Instructions (Addendum)
 Upper-Resume previous diet and medications. Awaiting pathology results. CT Chest, Abdomen and Pelvis. Referral to Dr. Deloise Ferries with CT Surgery and Medical Oncology.  Lower-Awaiting pathology results. Repeat Colonoscopy date to be determined based on pathology results. Handouts provided on Colon polyps and Diverticulosis  YOU HAD AN ENDOSCOPIC PROCEDURE TODAY AT THE [facility]:   Refer to the procedure report that was given to you for any specific questions about what was found during the examination.  If the procedure report does not answer your questions, please call your gastroenterologist to clarify.  If you requested that your care partner not be given the details of your procedure findings, then the procedure report has been included in a sealed envelope for you to review at your convenience later.  YOU SHOULD EXPECT: Some feelings of bloating in the abdomen. Passage of more gas than usual.  Walking can help get rid of the air that was put into your GI tract during the procedure and reduce the bloating. If you had a lower endoscopy (such as a colonoscopy or flexible sigmoidoscopy) you may notice spotting of blood in your stool or on the toilet paper. If you underwent a bowel prep for your procedure, you may not have a normal bowel movement for a few days.  Please Note:  You might notice some irritation and congestion in your nose or some drainage.  This is from the oxygen used during your procedure.  There is no need for concern and it should clear up in a day or so.  SYMPTOMS TO REPORT IMMEDIATELY:  Following lower endoscopy (colonoscopy or flexible sigmoidoscopy):  Excessive amounts of blood in the stool  Significant tenderness or worsening of abdominal pains  Swelling of the abdomen that is new, acute  Fever of 100F or higher  Following upper endoscopy (EGD)  Vomiting of blood or coffee ground material  New chest pain or pain under the shoulder blades  Painful or  persistently difficult swallowing  New shortness of breath  Fever of 100F or higher  Black, tarry-looking stools  For urgent or emergent issues, a gastroenterologist can be reached at any hour by calling [phone]. Do not use MyChart messaging for urgent concerns.    DIET:  We do recommend a small meal at first, but then you may proceed to your regular diet.  Drink plenty of fluids but you should avoid alcoholic beverages for 24 hours.  ACTIVITY:  You should plan to take it easy for the rest of today and you should NOT DRIVE or use heavy machinery until tomorrow (because of the sedation medicines used during the test).    FOLLOW UP: Our staff will call the number listed on your records the next business day following your procedure.  We will call around 7:15- 8:00 am to check on you and address any questions or concerns that you may have regarding the information given to you following your procedure. If we do not reach you, we will leave a message.     If any biopsies were taken you will be contacted by phone or by letter within the next 1-3 weeks.  Please call us  at [phone] if you have not heard about the biopsies in 3 weeks.    SIGNATURES/CONFIDENTIALITY: You and/or your care partner have signed paperwork which will be entered into your electronic medical record.  These signatures attest to the fact that that the information above on your After Visit Summary has been reviewed and is understood.  Full  responsibility of the confidentiality of this discharge information lies with you and/or your care-partner.

## 2024-01-20 NOTE — Telephone Encounter (Signed)
 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 Non obstruction but causes narrowing at GE junction.

## 2024-01-20 NOTE — Telephone Encounter (Signed)
 CT auth# [id number]   Valid 4/24-5/23

## 2024-01-20 NOTE — Progress Notes (Signed)
 Addendum: Reviewed and agree with assessment and management plan. IDA as well, appropriate for EGD/colonoscopy.  Kaiden Pech, Amber Bail, MD

## 2024-01-20 NOTE — Progress Notes (Signed)
 Pt's states no medical or surgical changes since previsit or office visit.

## 2024-01-20 NOTE — Progress Notes (Signed)
 Called to room to assist during endoscopic procedure.  Patient ID and intended procedure confirmed with present staff. Received instructions for my participation in the procedure from the performing physician.

## 2024-01-20 NOTE — Op Note (Signed)
 [facility] Patient Name: Matthew Lee Procedure Date: 01/20/2024 3:39 PM MRN: [id number] Endoscopist: Nannette Babe , MD, [id number] Age: 68 Referring MD:  Date of Birth: [birth year] Gender: Male Account #: [id number] Procedure:                Colonoscopy Indications:              Melena, Iron deficiency anemia Medicines:                Monitored Anesthesia Care Procedure:                Pre-Anesthesia Assessment:                           - Prior to the procedure, a History and Physical                            was performed, and patient medications and                            allergies were reviewed. The patient's tolerance of                            previous anesthesia was also reviewed. The risks                            and benefits of the procedure and the sedation                            options and risks were discussed with the patient.                            All questions were answered, and informed consent                            was obtained. Prior Anticoagulants: The patient has                            taken no anticoagulant or antiplatelet agents. ASA                            Grade Assessment: III - A patient with severe                            systemic disease. After reviewing the risks and                            benefits, the patient was deemed in satisfactory                            condition to undergo the procedure.                           After obtaining informed consent, the colonoscope  was passed under direct vision. Throughout the                            procedure, the patient's blood pressure, pulse, and                            oxygen saturations were monitored continuously. The                            Olympus Scope [id number] was introduced through the                            anus and advanced to the cecum, identified by                            appendiceal orifice and  ileocecal valve. The                            colonoscopy was performed without difficulty. The                            patient tolerated the procedure well. The quality                            of the bowel preparation was good. The ileocecal                            valve, appendiceal orifice, and rectum were                            photographed. Scope In: 4:01:41 PM Scope Out: 4:20:05 PM Scope Withdrawal Time: 0 hours 13 minutes 36 seconds  Total Procedure Duration: 0 hours 18 minutes 24 seconds  Findings:                 The digital rectal exam was normal.                           A 5 mm polyp was found in the cecum. The polyp was                            sessile. The polyp was removed with a cold snare.                            Resection and retrieval were complete.                           Multiple large-mouthed, medium-mouthed and                            small-mouthed diverticula were found in the sigmoid                            colon, descending colon and transverse colon.  The retroflexed view of the distal rectum and anal                            verge was normal and showed no anal or rectal                            abnormalities. Complications:            No immediate complications. Estimated Blood Loss:     Estimated blood loss: none. Impression:               - One 5 mm polyp in the cecum, removed with a cold                            snare. Resected and retrieved.                           - Severe diverticulosis in the sigmoid colon, in                            the descending colon and in the transverse colon.                           - The distal rectum and anal verge are normal on                            retroflexion view. Recommendation:           - Patient has a contact number available for                            emergencies. The signs and symptoms of potential                            delayed  complications were discussed with the                            patient. Return to normal activities tomorrow.                            Written discharge instructions were provided to the                            patient.                           - Resume previous diet.                           - Continue present medications.                           - Await pathology results.                           - See EGD report.                           -  Repeat colonoscopy is recommended for                            surveillance. The colonoscopy date will be                            determined after pathology results from today's                            exam become available for review. Nannette Babe, MD 01/20/2024 4:36:42 PM This report has been signed electronically.

## 2024-01-20 NOTE — Progress Notes (Signed)
 See office note dated for 825 for details and current H&P  Patient presenting for upper and lower endoscopy to evaluate iron deficiency anemia and recent melenic stool  He remains appropriate for upper and lower endoscopy in the [facility] today

## 2024-01-20 NOTE — Progress Notes (Signed)
 Vss nad trans to pacu

## 2024-01-20 NOTE — Telephone Encounter (Signed)
 Patient has been scheduled for CT chest/abdomen/pelvis at [facility] tomorrow, 01/21/24 at 12:30 pm, 12:15 pm arrival.  Patient has been advised of this appointment time/date/location by [facility] RN staff.

## 2024-01-20 NOTE — Op Note (Signed)
 [facility] Patient Name: Matthew Lee Procedure Date: 01/20/2024 3:43 PM MRN: [id number] Endoscopist: Nannette Babe , MD, [id number] Age: 68 Referring MD:  Date of Birth: 1956-02-23 Gender: Male Account #: [id number] Procedure:                Upper GI endoscopy Indications:              Iron deficiency anemia, Melena Medicines:                Monitored Anesthesia Care Procedure:                Pre-Anesthesia Assessment:                           - Prior to the procedure, a History and Physical                            was performed, and patient medications and                            allergies were reviewed. The patient's tolerance of                            previous anesthesia was also reviewed. The risks                            and benefits of the procedure and the sedation                            options and risks were discussed with the patient.                            All questions were answered, and informed consent                            was obtained. Prior Anticoagulants: The patient has                            taken no anticoagulant or antiplatelet agents. ASA                            Grade Assessment: III - A patient with severe                            systemic disease. After reviewing the risks and                            benefits, the patient was deemed in satisfactory                            condition to undergo the procedure.                           After obtaining informed consent, the endoscope was  passed under direct vision. Throughout the                            procedure, the patient's blood pressure, pulse, and                            oxygen saturations were monitored continuously. The                            GIF [id number] #[id number] was introduced through the                            mouth, and advanced to the second part of duodenum.                            The upper GI endoscopy was  accomplished without                            difficulty. The patient tolerated the procedure                            well. Scope In: Scope Out: Findings:                 Diffuse moderate mucosal changes characterized by                            congestion, longitudinal markings and                            circumferential folds were found in the entire                            esophagus. Consistent with reflux change.                           A large, fungating and ulcerated, three-quarter                            circumferential mass with oozing bleeding was found                            at the gastroesophageal junction and in the cardia.                            Biopsies were taken with a cold forceps for                            histology.                           The examined duodenum was normal. Complications:            No immediate complications. Estimated Blood Loss:     Estimated blood loss was minimal. Impression:               -  Congested, longitudinally marked,                            circumferentially folded mucosa in the esophagus                            consistent with acid reflux.                           - Malignant gastric tumor at the gastroesophageal                            junction and in the cardia. Not obstructing today,                            but causes narrowing at GE junction. Biopsied.                           - Normal examined duodenum. Recommendation:           - Patient has a contact number available for                            emergencies. The signs and symptoms of potential                            delayed complications were discussed with the                            patient. Return to normal activities tomorrow.                            Written discharge instructions were provided to the                            patient.                           - Resume previous diet.                           -  Continue present medications.                           - Await pathology results.                           - CT chest, abdomen and pelvis.                           - Referral to Dr. Deloise Ferries with CT surgery and                            medical oncology. Nannette Babe, MD 01/20/2024 4:33:26 PM This report has been signed electronically.

## 2024-01-21 ENCOUNTER — Encounter: Payer: Self-pay | Admitting: Internal Medicine

## 2024-01-21 ENCOUNTER — Ambulatory Visit (HOSPITAL_BASED_OUTPATIENT_CLINIC_OR_DEPARTMENT_OTHER)
Admission: RE | Admit: 2024-01-21 | Discharge: 2024-01-21 | Disposition: A | Discharge status: Home / Self Care | Source: Ambulatory Visit | Attending: Internal Medicine | Admitting: Internal Medicine

## 2024-01-21 ENCOUNTER — Encounter: Payer: Self-pay | Admitting: Family

## 2024-01-21 ENCOUNTER — Telehealth: Payer: Self-pay | Admitting: *Deleted

## 2024-01-21 DIAGNOSIS — K5730 Diverticulosis of large intestine without perforation or abscess without bleeding: Secondary | ICD-10-CM | POA: Diagnosis not present

## 2024-01-21 DIAGNOSIS — D490 Neoplasm of unspecified behavior of digestive system: Secondary | ICD-10-CM | POA: Diagnosis not present

## 2024-01-21 DIAGNOSIS — N281 Cyst of kidney, acquired: Secondary | ICD-10-CM | POA: Diagnosis not present

## 2024-01-21 DIAGNOSIS — R911 Solitary pulmonary nodule: Secondary | ICD-10-CM | POA: Diagnosis not present

## 2024-01-21 MED ORDER — IOHEXOL 300 MG/ML  SOLN
100.0000 mL | Freq: Once | INTRAMUSCULAR | Status: AC | PRN
  Administered 2024-01-21: 100 mL via INTRAVENOUS

## 2024-01-21 MED ORDER — ZOLPIDEM TARTRATE 5 MG PO TABS: 5.0000 mg | ORAL_TABLET | Freq: Every evening | ORAL | 0 refills | Status: DC | PRN

## 2024-01-21 NOTE — Telephone Encounter (Signed)
  Follow up Call-     01/20/2024    3:05 PM  Call back number  Post procedure Call Back phone  # [phone]  Permission to leave phone message Yes     Patient questions:  Do you have a fever, pain , or abdominal swelling? No. Pain Score  0 *  Have you tolerated food without any problems? Yes.    Have you been able to return to your normal activities? Yes.    Do you have any questions about your discharge instructions: Diet   No. Medications  No. Follow up visit  No.  Do you have questions or concerns about your Care? No.  Actions: * If pain score is 4 or above: No action needed, pain <4.

## 2024-01-24 NOTE — Telephone Encounter (Signed)
 Please determine what pain he is having and from where? This can help me determine what to use JMP

## 2024-01-25 ENCOUNTER — Encounter: Payer: Self-pay | Admitting: Internal Medicine

## 2024-01-25 ENCOUNTER — Emergency Department (HOSPITAL_COMMUNITY): Admitting: Anesthesiology

## 2024-01-25 ENCOUNTER — Inpatient Hospital Stay (HOSPITAL_BASED_OUTPATIENT_CLINIC_OR_DEPARTMENT_OTHER)
Admission: EM | Admit: 2024-01-25 | Discharge: 2024-01-29 | DRG: 025 | Disposition: A | Discharge status: Home / Self Care | Attending: Internal Medicine | Admitting: Internal Medicine

## 2024-01-25 ENCOUNTER — Other Ambulatory Visit: Payer: Self-pay

## 2024-01-25 ENCOUNTER — Encounter (HOSPITAL_BASED_OUTPATIENT_CLINIC_OR_DEPARTMENT_OTHER): Payer: Self-pay

## 2024-01-25 ENCOUNTER — Emergency Department (HOSPITAL_BASED_OUTPATIENT_CLINIC_OR_DEPARTMENT_OTHER)

## 2024-01-25 ENCOUNTER — Encounter (HOSPITAL_COMMUNITY)
Admission: EM | Disposition: A | Discharge status: Home / Self Care | Payer: Self-pay | Source: Home / Self Care | Attending: Internal Medicine

## 2024-01-25 DIAGNOSIS — E876 Hypokalemia: Secondary | ICD-10-CM | POA: Diagnosis not present

## 2024-01-25 DIAGNOSIS — G479 Sleep disorder, unspecified: Secondary | ICD-10-CM | POA: Diagnosis present

## 2024-01-25 DIAGNOSIS — R402362 Coma scale, best motor response, obeys commands, at arrival to emergency department: Secondary | ICD-10-CM | POA: Diagnosis not present

## 2024-01-25 DIAGNOSIS — E785 Hyperlipidemia, unspecified: Secondary | ICD-10-CM | POA: Diagnosis not present

## 2024-01-25 DIAGNOSIS — G935 Compression of brain: Secondary | ICD-10-CM | POA: Diagnosis not present

## 2024-01-25 DIAGNOSIS — I2510 Atherosclerotic heart disease of native coronary artery without angina pectoris: Secondary | ICD-10-CM | POA: Diagnosis present

## 2024-01-25 DIAGNOSIS — E782 Mixed hyperlipidemia: Secondary | ICD-10-CM

## 2024-01-25 DIAGNOSIS — S065X0A Traumatic subdural hemorrhage without loss of consciousness, initial encounter: Secondary | ICD-10-CM | POA: Diagnosis not present

## 2024-01-25 DIAGNOSIS — W19XXXA Unspecified fall, initial encounter: Secondary | ICD-10-CM | POA: Diagnosis not present

## 2024-01-25 DIAGNOSIS — R402142 Coma scale, eyes open, spontaneous, at arrival to emergency department: Secondary | ICD-10-CM | POA: Diagnosis present

## 2024-01-25 DIAGNOSIS — R590 Localized enlarged lymph nodes: Secondary | ICD-10-CM | POA: Diagnosis not present

## 2024-01-25 DIAGNOSIS — R42 Dizziness and giddiness: Secondary | ICD-10-CM | POA: Diagnosis not present

## 2024-01-25 DIAGNOSIS — Z87891 Personal history of nicotine dependence: Secondary | ICD-10-CM | POA: Diagnosis not present

## 2024-01-25 DIAGNOSIS — I110 Hypertensive heart disease with heart failure: Secondary | ICD-10-CM | POA: Diagnosis not present

## 2024-01-25 DIAGNOSIS — C160 Malignant neoplasm of cardia: Secondary | ICD-10-CM | POA: Diagnosis not present

## 2024-01-25 DIAGNOSIS — R599 Enlarged lymph nodes, unspecified: Secondary | ICD-10-CM | POA: Diagnosis not present

## 2024-01-25 DIAGNOSIS — I6200 Nontraumatic subdural hemorrhage, unspecified: Secondary | ICD-10-CM

## 2024-01-25 DIAGNOSIS — R402252 Coma scale, best verbal response, oriented, at arrival to emergency department: Secondary | ICD-10-CM | POA: Diagnosis present

## 2024-01-25 DIAGNOSIS — C169 Malignant neoplasm of stomach, unspecified: Secondary | ICD-10-CM | POA: Diagnosis not present

## 2024-01-25 DIAGNOSIS — E871 Hypo-osmolality and hyponatremia: Secondary | ICD-10-CM | POA: Diagnosis not present

## 2024-01-25 DIAGNOSIS — Z79899 Other long term (current) drug therapy: Secondary | ICD-10-CM | POA: Diagnosis not present

## 2024-01-25 DIAGNOSIS — K219 Gastro-esophageal reflux disease without esophagitis: Secondary | ICD-10-CM | POA: Diagnosis present

## 2024-01-25 DIAGNOSIS — S065XAA Traumatic subdural hemorrhage with loss of consciousness status unknown, initial encounter: Principal | ICD-10-CM | POA: Diagnosis present

## 2024-01-25 DIAGNOSIS — I5032 Chronic diastolic (congestive) heart failure: Secondary | ICD-10-CM | POA: Diagnosis not present

## 2024-01-25 DIAGNOSIS — R930 Abnormal findings on diagnostic imaging of skull and head, not elsewhere classified: Secondary | ICD-10-CM | POA: Diagnosis not present

## 2024-01-25 DIAGNOSIS — Z841 Family history of disorders of kidney and ureter: Secondary | ICD-10-CM

## 2024-01-25 DIAGNOSIS — I6202 Nontraumatic subacute subdural hemorrhage: Secondary | ICD-10-CM | POA: Diagnosis not present

## 2024-01-25 DIAGNOSIS — Z8619 Personal history of other infectious and parasitic diseases: Secondary | ICD-10-CM

## 2024-01-25 DIAGNOSIS — R4182 Altered mental status, unspecified: Secondary | ICD-10-CM | POA: Diagnosis not present

## 2024-01-25 DIAGNOSIS — R55 Syncope and collapse: Secondary | ICD-10-CM | POA: Diagnosis not present

## 2024-01-25 DIAGNOSIS — Z8616 Personal history of COVID-19: Secondary | ICD-10-CM | POA: Diagnosis not present

## 2024-01-25 DIAGNOSIS — G319 Degenerative disease of nervous system, unspecified: Secondary | ICD-10-CM | POA: Diagnosis not present

## 2024-01-25 DIAGNOSIS — T502X5A Adverse effect of carbonic-anhydrase inhibitors, benzothiadiazides and other diuretics, initial encounter: Secondary | ICD-10-CM | POA: Diagnosis not present

## 2024-01-25 DIAGNOSIS — I5030 Unspecified diastolic (congestive) heart failure: Secondary | ICD-10-CM

## 2024-01-25 DIAGNOSIS — D509 Iron deficiency anemia, unspecified: Secondary | ICD-10-CM | POA: Diagnosis present

## 2024-01-25 DIAGNOSIS — I6203 Nontraumatic chronic subdural hemorrhage: Secondary | ICD-10-CM | POA: Diagnosis not present

## 2024-01-25 DIAGNOSIS — I10 Essential (primary) hypertension: Secondary | ICD-10-CM

## 2024-01-25 DIAGNOSIS — I6782 Cerebral ischemia: Secondary | ICD-10-CM | POA: Diagnosis not present

## 2024-01-25 DIAGNOSIS — G9389 Other specified disorders of brain: Secondary | ICD-10-CM | POA: Diagnosis not present

## 2024-01-25 HISTORY — PX: CRANIOTOMY: SHX93

## 2024-01-25 LAB — COMPREHENSIVE METABOLIC PANEL WITH GFR
ALT: 16 U/L (ref 0–44)
AST: 23 U/L (ref 15–41)
Albumin: 4.3 g/dL (ref 3.5–5.0)
Alkaline Phosphatase: 117 U/L (ref 38–126)
Anion gap: 14 (ref 5–15)
BUN: 17 mg/dL (ref 8–23)
CO2: 21 mmol/L — ABNORMAL LOW (ref 22–32)
Calcium: 9.4 mg/dL (ref 8.9–10.3)
Chloride: 100 mmol/L (ref 98–111)
Creatinine, Ser: 0.97 mg/dL (ref 0.61–1.24)
GFR, Estimated: >60 mL/min (ref >60)
Glucose, Bld: 111 mg/dL — ABNORMAL HIGH (ref 70–99)
Potassium: 4 mmol/L (ref 3.5–5.1)
Sodium: 135 mmol/L (ref 135–145)
Total Bilirubin: 0.2 mg/dL (ref 0.0–1.2)
Total Protein: 7.2 g/dL (ref 6.5–8.1)

## 2024-01-25 LAB — DIFFERENTIAL
Abs Immature Granulocytes: 0.02 10*3/uL (ref 0.00–0.07)
Basophils Absolute: 0.1 10*3/uL (ref 0.0–0.1)
Basophils Relative: 1 %
Eosinophils Absolute: 0.2 10*3/uL (ref 0.0–0.5)
Eosinophils Relative: 2 %
Immature Granulocytes: 0 %
Lymphocytes Relative: 17 %
Lymphs Abs: 1.5 10*3/uL (ref 0.7–4.0)
Monocytes Absolute: 1 10*3/uL (ref 0.1–1.0)
Monocytes Relative: 11 %
Neutro Abs: 6 10*3/uL (ref 1.7–7.7)
Neutrophils Relative %: 69 %

## 2024-01-25 LAB — MRSA NEXT GEN BY PCR, NASAL: MRSA by PCR Next Gen: NOT DETECTED

## 2024-01-25 LAB — CBC
HCT: 35.3 % — ABNORMAL LOW (ref 39.0–52.0)
Hemoglobin: 11.2 g/dL — ABNORMAL LOW (ref 13.0–17.0)
MCH: 25.3 pg — ABNORMAL LOW (ref 26.0–34.0)
MCHC: 31.7 g/dL (ref 30.0–36.0)
MCV: 79.9 fL — ABNORMAL LOW (ref 80.0–100.0)
Platelets: 354 10*3/uL (ref 150–400)
RBC: 4.42 MIL/uL (ref 4.22–5.81)
RDW: 13.9 % (ref 11.5–15.5)
WBC: 8.7 10*3/uL (ref 4.0–10.5)
nRBC: 0 % (ref 0.0–0.2)

## 2024-01-25 LAB — APTT: aPTT: 28 s (ref 24–36)

## 2024-01-25 LAB — TYPE AND SCREEN
ABO/RH(D): O POS
Antibody Screen: NEGATIVE

## 2024-01-25 LAB — ETHANOL: Alcohol, Ethyl (B): <15 mg/dL (ref <15)

## 2024-01-25 LAB — CBG MONITORING, ED: Glucose-Capillary: 135 mg/dL — ABNORMAL HIGH (ref 70–99)

## 2024-01-25 LAB — ABO/RH: ABO/RH(D): O POS

## 2024-01-25 LAB — GLUCOSE, CAPILLARY
Glucose-Capillary: 130 mg/dL — ABNORMAL HIGH (ref 70–99)
Glucose-Capillary: 193 mg/dL — ABNORMAL HIGH (ref 70–99)

## 2024-01-25 LAB — PROTIME-INR
INR: 1 (ref 0.8–1.2)
Prothrombin Time: 13.4 s (ref 11.4–15.2)

## 2024-01-25 LAB — SURGICAL PATHOLOGY

## 2024-01-25 SURGERY — CRANIOTOMY HEMATOMA EVACUATION SUBDURAL
Anesthesia: General | Site: Head | Laterality: Right

## 2024-01-25 MED ORDER — LOSARTAN POTASSIUM 50 MG PO TABS
100.0000 mg | ORAL_TABLET | Freq: Every day | ORAL | Status: DC
  Administered 2024-01-26 – 2024-01-29 (×4): 100 mg via ORAL
  Filled 2024-01-25 (×4): qty 2

## 2024-01-25 MED ORDER — CLEVIDIPINE BUTYRATE 0.5 MG/ML IV EMUL
INTRAVENOUS | Status: AC
  Filled 2024-01-25: qty 50

## 2024-01-25 MED ORDER — ACETAMINOPHEN 10 MG/ML IV SOLN: 1000.0000 mg | Freq: Once | INTRAVENOUS | Status: DC | PRN

## 2024-01-25 MED ORDER — PHENYLEPHRINE HCL-NACL 20-0.9 MG/250ML-% IV SOLN
INTRAVENOUS | Status: DC | PRN
  Administered 2024-01-25: 10 ug/min via INTRAVENOUS

## 2024-01-25 MED ORDER — ORAL CARE MOUTH RINSE: 15.0000 mL | OROMUCOSAL | Status: DC | PRN

## 2024-01-25 MED ORDER — LABETALOL HCL 300 MG PO TABS
150.0000 mg | ORAL_TABLET | Freq: Two times a day (BID) | ORAL | Status: DC
  Administered 2024-01-25 – 2024-01-29 (×8): 150 mg via ORAL
  Filled 2024-01-25 (×11): qty 0.5

## 2024-01-25 MED ORDER — LIDOCAINE-EPINEPHRINE 1 %-1:100000 IJ SOLN
INTRAMUSCULAR | Status: DC | PRN
  Administered 2024-01-25: 7 mL via INTRADERMAL

## 2024-01-25 MED ORDER — CHLORHEXIDINE GLUCONATE 0.12 % MT SOLN
OROMUCOSAL | Status: AC
  Filled 2024-01-25: qty 15

## 2024-01-25 MED ORDER — THROMBIN 5000 UNITS EX KIT
PACK | CUTANEOUS | Status: AC
  Filled 2024-01-25: qty 1

## 2024-01-25 MED ORDER — CEFAZOLIN SODIUM-DEXTROSE 1-4 GM/50ML-% IV SOLN
1.0000 g | Freq: Three times a day (TID) | INTRAVENOUS | Status: AC
  Administered 2024-01-25 – 2024-01-26 (×2): 1 g via INTRAVENOUS
  Filled 2024-01-25 (×2): qty 50

## 2024-01-25 MED ORDER — OXYCODONE-ACETAMINOPHEN 5-325 MG PO TABS
1.0000 | ORAL_TABLET | ORAL | Status: DC | PRN
  Administered 2024-01-26 – 2024-01-28 (×8): 1 via ORAL
  Filled 2024-01-25 (×9): qty 1

## 2024-01-25 MED ORDER — LIDOCAINE-EPINEPHRINE 1 %-1:100000 IJ SOLN
INTRAMUSCULAR | Status: AC
  Filled 2024-01-25: qty 1

## 2024-01-25 MED ORDER — LEVETIRACETAM IN NACL 500 MG/100ML IV SOLN
500.0000 mg | Freq: Two times a day (BID) | INTRAVENOUS | Status: AC
Start: 2024-01-25 — End: 2024-01-25
  Administered 2024-01-25: 500 mg via INTRAVENOUS
  Filled 2024-01-25: qty 100

## 2024-01-25 MED ORDER — FLEET ENEMA RE ENEM: 1.0000 | ENEMA | Freq: Once | RECTAL | Status: DC | PRN

## 2024-01-25 MED ORDER — SUGAMMADEX SODIUM 200 MG/2ML IV SOLN
INTRAVENOUS | Status: DC | PRN
Start: 2024-01-25 — End: 2024-01-25
  Administered 2024-01-25: 140.6 mg via INTRAVENOUS

## 2024-01-25 MED ORDER — 0.9 % SODIUM CHLORIDE (POUR BTL) OPTIME
TOPICAL | Status: DC | PRN
  Administered 2024-01-25 (×4): 1000 mL

## 2024-01-25 MED ORDER — CHLORHEXIDINE GLUCONATE CLOTH 2 % EX PADS
6.0000 | MEDICATED_PAD | Freq: Every day | CUTANEOUS | Status: DC
  Administered 2024-01-25 – 2024-01-29 (×3): 6 via TOPICAL

## 2024-01-25 MED ORDER — CEFAZOLIN SODIUM-DEXTROSE 2-4 GM/100ML-% IV SOLN
INTRAVENOUS | Status: AC
  Filled 2024-01-25: qty 100

## 2024-01-25 MED ORDER — PANTOPRAZOLE SODIUM 40 MG PO TBEC
40.0000 mg | DELAYED_RELEASE_TABLET | Freq: Two times a day (BID) | ORAL | Status: DC
  Administered 2024-01-25 – 2024-01-29 (×8): 40 mg via ORAL
  Filled 2024-01-25 (×9): qty 1

## 2024-01-25 MED ORDER — HYDROCHLOROTHIAZIDE 25 MG PO TABS
25.0000 mg | ORAL_TABLET | Freq: Every day | ORAL | Status: DC
  Administered 2024-01-26 – 2024-01-29 (×4): 25 mg via ORAL
  Filled 2024-01-25 (×4): qty 1

## 2024-01-25 MED ORDER — DROPERIDOL 2.5 MG/ML IJ SOLN: 0.6250 mg | Freq: Once | INTRAMUSCULAR | Status: DC | PRN

## 2024-01-25 MED ORDER — OXYCODONE HCL 5 MG/5ML PO SOLN: 5.0000 mg | Freq: Once | ORAL | Status: DC | PRN

## 2024-01-25 MED ORDER — SODIUM CHLORIDE 0.9 % IV SOLN: INTRAVENOUS | Status: AC

## 2024-01-25 MED ORDER — PROMETHAZINE HCL 12.5 MG PO TABS: 12.5000 mg | ORAL_TABLET | ORAL | Status: DC | PRN

## 2024-01-25 MED ORDER — ACETAMINOPHEN 325 MG PO TABS
650.0000 mg | ORAL_TABLET | ORAL | Status: DC | PRN
  Administered 2024-01-26 – 2024-01-29 (×6): 650 mg via ORAL
  Filled 2024-01-25 (×6): qty 2

## 2024-01-25 MED ORDER — FENTANYL CITRATE (PF) 250 MCG/5ML IJ SOLN
INTRAMUSCULAR | Status: DC | PRN
  Administered 2024-01-25: 100 ug via INTRAVENOUS

## 2024-01-25 MED ORDER — DEXAMETHASONE SODIUM PHOSPHATE 10 MG/ML IJ SOLN
INTRAMUSCULAR | Status: DC | PRN
  Administered 2024-01-25: 10 mg via INTRAVENOUS

## 2024-01-25 MED ORDER — OXIDIZED CELLULOSE EX PADS
MEDICATED_PAD | CUTANEOUS | Status: DC | PRN
  Administered 2024-01-25: 1 via TOPICAL

## 2024-01-25 MED ORDER — THROMBIN 20000 UNITS EX SOLR: CUTANEOUS | Status: DC | PRN

## 2024-01-25 MED ORDER — BACITRACIN ZINC 500 UNIT/GM EX OINT
TOPICAL_OINTMENT | CUTANEOUS | Status: AC
  Filled 2024-01-25: qty 28.35

## 2024-01-25 MED ORDER — ZOLPIDEM TARTRATE 5 MG PO TABS
5.0000 mg | ORAL_TABLET | Freq: Every evening | ORAL | Status: DC | PRN
  Administered 2024-01-26 – 2024-01-28 (×3): 5 mg via ORAL
  Filled 2024-01-25 (×3): qty 1

## 2024-01-25 MED ORDER — THROMBIN 20000 UNITS EX SOLR
CUTANEOUS | Status: AC
  Filled 2024-01-25: qty 20000

## 2024-01-25 MED ORDER — AMLODIPINE BESYLATE 5 MG PO TABS
10.0000 mg | ORAL_TABLET | Freq: Every day | ORAL | Status: DC
  Administered 2024-01-25 – 2024-01-29 (×5): 10 mg via ORAL
  Filled 2024-01-25: qty 2
  Filled 2024-01-25: qty 1
  Filled 2024-01-25: qty 2
  Filled 2024-01-25 (×2): qty 1

## 2024-01-25 MED ORDER — SODIUM CHLORIDE 0.9% FLUSH
3.0000 mL | Freq: Once | INTRAVENOUS | Status: DC
  Filled 2024-01-25: qty 3

## 2024-01-25 MED ORDER — LABETALOL HCL 5 MG/ML IV SOLN: 10.0000 mg | INTRAVENOUS | Status: DC | PRN

## 2024-01-25 MED ORDER — PROPOFOL 10 MG/ML IV BOLUS
INTRAVENOUS | Status: DC | PRN
  Administered 2024-01-25: 130 mg via INTRAVENOUS

## 2024-01-25 MED ORDER — FERROUS SULFATE 325 (65 FE) MG PO TABS
325.0000 mg | ORAL_TABLET | ORAL | Status: DC
  Administered 2024-01-27 – 2024-01-29 (×2): 325 mg via ORAL
  Filled 2024-01-25 (×2): qty 1

## 2024-01-25 MED ORDER — ONDANSETRON HCL 4 MG/2ML IJ SOLN
INTRAMUSCULAR | Status: DC | PRN
  Administered 2024-01-25: 4 mg via INTRAVENOUS

## 2024-01-25 MED ORDER — ROCURONIUM BROMIDE 10 MG/ML (PF) SYRINGE
PREFILLED_SYRINGE | INTRAVENOUS | Status: DC | PRN
  Administered 2024-01-25: 50 mg via INTRAVENOUS

## 2024-01-25 MED ORDER — ACETAMINOPHEN 325 MG PO TABS
650.0000 mg | ORAL_TABLET | Freq: Once | ORAL | Status: AC
  Administered 2024-01-25: 650 mg via ORAL
  Filled 2024-01-25: qty 2

## 2024-01-25 MED ORDER — LEVETIRACETAM IN NACL 500 MG/100ML IV SOLN
500.0000 mg | Freq: Two times a day (BID) | INTRAVENOUS | Status: DC
  Administered 2024-01-25 – 2024-01-26 (×2): 500 mg via INTRAVENOUS
  Filled 2024-01-25 (×2): qty 100

## 2024-01-25 MED ORDER — DOCUSATE SODIUM 100 MG PO CAPS
100.0000 mg | ORAL_CAPSULE | Freq: Two times a day (BID) | ORAL | Status: DC
  Administered 2024-01-25 – 2024-01-29 (×7): 100 mg via ORAL
  Filled 2024-01-25 (×7): qty 1

## 2024-01-25 MED ORDER — THROMBIN 5000 UNITS EX SOLR: OROMUCOSAL | Status: DC | PRN

## 2024-01-25 MED ORDER — ONDANSETRON HCL 4 MG PO TABS: 4.0000 mg | ORAL_TABLET | ORAL | Status: DC | PRN

## 2024-01-25 MED ORDER — POLYETHYLENE GLYCOL 3350 17 G PO PACK: 17.0000 g | PACK | Freq: Every day | ORAL | Status: DC | PRN

## 2024-01-25 MED ORDER — SODIUM CHLORIDE 0.9 % IV SOLN: INTRAVENOUS | Status: DC

## 2024-01-25 MED ORDER — CHLORHEXIDINE GLUCONATE 0.12 % MT SOLN
15.0000 mL | Freq: Once | OROMUCOSAL | Status: AC
  Administered 2024-01-25: 15 mL via OROMUCOSAL

## 2024-01-25 MED ORDER — ONDANSETRON HCL 4 MG/2ML IJ SOLN: 4.0000 mg | INTRAMUSCULAR | Status: DC | PRN

## 2024-01-25 MED ORDER — ATORVASTATIN CALCIUM 10 MG PO TABS
10.0000 mg | ORAL_TABLET | Freq: Every day | ORAL | Status: DC
  Administered 2024-01-26 – 2024-01-29 (×4): 10 mg via ORAL
  Filled 2024-01-25 (×4): qty 1

## 2024-01-25 MED ORDER — ORAL CARE MOUTH RINSE: 15.0000 mL | Freq: Once | OROMUCOSAL | Status: AC

## 2024-01-25 MED ORDER — OXYCODONE HCL 5 MG PO TABS: 5.0000 mg | ORAL_TABLET | Freq: Once | ORAL | Status: DC | PRN

## 2024-01-25 MED ORDER — LIDOCAINE 2% (20 MG/ML) 5 ML SYRINGE
INTRAMUSCULAR | Status: DC | PRN
  Administered 2024-01-25: 60 mg via INTRAVENOUS

## 2024-01-25 MED ORDER — FENTANYL CITRATE (PF) 250 MCG/5ML IJ SOLN
INTRAMUSCULAR | Status: AC
  Filled 2024-01-25: qty 5

## 2024-01-25 MED ORDER — SODIUM CHLORIDE 0.9 % IV SOLN
20.0000 ug | Freq: Once | INTRAVENOUS | Status: AC
  Administered 2024-01-25: 20 ug via INTRAVENOUS
  Filled 2024-01-25: qty 5

## 2024-01-25 MED ORDER — HYDROCODONE-ACETAMINOPHEN 5-325 MG PO TABS
1.0000 | ORAL_TABLET | ORAL | Status: DC | PRN
  Administered 2024-01-25 – 2024-01-27 (×5): 1 via ORAL
  Filled 2024-01-25 (×5): qty 1

## 2024-01-25 MED ORDER — SODIUM CHLORIDE 0.9 % IV SOLN: INTRAVENOUS | Status: DC | PRN

## 2024-01-25 MED ORDER — FENTANYL CITRATE (PF) 100 MCG/2ML IJ SOLN: 25.0000 ug | INTRAMUSCULAR | Status: DC | PRN

## 2024-01-25 MED ORDER — CLEVIDIPINE BUTYRATE 0.5 MG/ML IV EMUL
INTRAVENOUS | Status: DC | PRN
  Administered 2024-01-25: 9 mg/h via INTRAVENOUS

## 2024-01-25 MED ORDER — ACETAMINOPHEN 650 MG RE SUPP: 650.0000 mg | RECTAL | Status: DC | PRN

## 2024-01-25 SURGICAL SUPPLY — 74 items
BAG COUNTER SPONGE SURGICOUNT (BAG) ×1 IMPLANT
BENZOIN TINCTURE PRP APPL 2/3 (GAUZE/BANDAGES/DRESSINGS) ×1 IMPLANT
BLADE CLIPPER SURG (BLADE) ×1 IMPLANT
BNDG GAUZE DERMACEA FLUFF 4 (GAUZE/BANDAGES/DRESSINGS) IMPLANT
BUR ACORN 9.0 PRECISION (BURR) ×1 IMPLANT
BUR SPIRAL ROUTER 2.3 (BUR) ×1 IMPLANT
CANISTER SUCT 3000ML PPV (MISCELLANEOUS) ×1 IMPLANT
CATH VENTRIC 35X38 W/TROCAR LG (CATHETERS) IMPLANT
CLIP TI MEDIUM 6 (CLIP) IMPLANT
COVER BURR HOLE UNIV 10 (Orthopedic Implant) IMPLANT
DERMABOND ADVANCED .7 DNX12 (GAUZE/BANDAGES/DRESSINGS) ×1 IMPLANT
DRAPE MICROSCOPE SLANT 54X150 (MISCELLANEOUS) IMPLANT
DRAPE NEUROLOGICAL W/INCISE (DRAPES) ×1 IMPLANT
DRAPE SHEET LG 3/4 BI-LAMINATE (DRAPES) ×1 IMPLANT
DRAPE WARM FLUID 44X44 (DRAPES) ×1 IMPLANT
DRESSING MEPILEX FLEX 4X4 (GAUZE/BANDAGES/DRESSINGS) IMPLANT
DRSG AQUACEL AG ADV 3.5X 6 (GAUZE/BANDAGES/DRESSINGS) ×1 IMPLANT
DRSG AQUACEL AG ADV 3.5X10 (GAUZE/BANDAGES/DRESSINGS) IMPLANT
DRSG XEROFORM 1X8 (GAUZE/BANDAGES/DRESSINGS) IMPLANT
DURAPREP 26ML APPLICATOR (WOUND CARE) ×1 IMPLANT
ELECT COATED BLADE 2.86 ST (ELECTRODE) ×1 IMPLANT
ELECTRODE REM PT RTRN 9FT ADLT (ELECTROSURGICAL) ×1 IMPLANT
EVACUATOR 1/8 PVC DRAIN (DRAIN) IMPLANT
EVACUATOR SILICONE 100CC (DRAIN) IMPLANT
FORCEPS BIPOLAR SPETZLER 8 1.0 (NEUROSURGERY SUPPLIES) ×1 IMPLANT
GAUZE 4X4 16PLY ~~LOC~~+RFID DBL (SPONGE) IMPLANT
GAUZE SPONGE 4X4 12PLY STRL (GAUZE/BANDAGES/DRESSINGS) ×1 IMPLANT
GLOVE BIO SURGEON STRL SZ7 (GLOVE) ×2 IMPLANT
GLOVE BIOGEL PI IND STRL 7.5 (GLOVE) ×3 IMPLANT
GLOVE ECLIPSE 7.5 STRL STRAW (GLOVE) ×1 IMPLANT
GOWN STRL REUS W/ TWL LRG LVL3 (GOWN DISPOSABLE) ×2 IMPLANT
GOWN STRL REUS W/ TWL XL LVL3 (GOWN DISPOSABLE) ×1 IMPLANT
GOWN STRL REUS W/TWL 2XL LVL3 (GOWN DISPOSABLE) IMPLANT
GRAFT DURAGEN MATRIX 3X3 SNGL (Graft) IMPLANT
HEMOSTAT POWDER KIT SURGIFOAM (HEMOSTASIS) ×1 IMPLANT
HEMOSTAT SURGICEL 2X14 (HEMOSTASIS) ×1 IMPLANT
HOOK RETRACTION 12 ELAST STAY (MISCELLANEOUS) IMPLANT
KIT BASIN OR (CUSTOM PROCEDURE TRAY) ×1 IMPLANT
KIT TURNOVER KIT B (KITS) ×1 IMPLANT
NDL HYPO 22X1.5 SAFETY MO (MISCELLANEOUS) ×1 IMPLANT
NEEDLE HYPO 22X1.5 SAFETY MO (MISCELLANEOUS) ×1 IMPLANT
NS IRRIG 1000ML POUR BTL (IV SOLUTION) ×3 IMPLANT
PACK BATTERY CMF DISP FOR DVR (ORTHOPEDIC DISPOSABLE SUPPLIES) IMPLANT
PACK CRANIOTOMY CUSTOM (CUSTOM PROCEDURE TRAY) ×1 IMPLANT
PATTIES SURGICAL .5 X.5 (GAUZE/BANDAGES/DRESSINGS) ×1 IMPLANT
PATTIES SURGICAL .5 X3 (DISPOSABLE) IMPLANT
PATTIES SURGICAL 1X1 (DISPOSABLE) ×1 IMPLANT
PERFORATOR LRG 14-11MM (BIT) IMPLANT
PLATE BONE 12 2H TARGET XL (Plate) IMPLANT
SCREW UNIII AXS SD 1.5X4 (Screw) IMPLANT
SEPRAFILM MEMBRANE 5X6 (MISCELLANEOUS) IMPLANT
SPONGE NEURO XRAY DETECT 1X3 (DISPOSABLE) IMPLANT
SPONGE SURGIFOAM ABS GEL 100 (HEMOSTASIS) ×1 IMPLANT
SPONGE T-LAP 18X18 ~~LOC~~+RFID (SPONGE) ×1 IMPLANT
STAPLER VISISTAT (STAPLE) ×2 IMPLANT
STOCKINETTE 6 STRL (DRAPES) ×1 IMPLANT
STRIP CLOSURE SKIN 1/2X4 (GAUZE/BANDAGES/DRESSINGS) ×1 IMPLANT
SUT 3-0 BLK 1X30 PSL (SUTURE) IMPLANT
SUT ETHILON 3 0 PS 1 (SUTURE) IMPLANT
SUT MON AB 3-0 SH27 (SUTURE) IMPLANT
SUT NURALON 4 0 TR CR/8 (SUTURE) IMPLANT
SUT VIC AB 2-0 CP2 18 (SUTURE) ×2 IMPLANT
SUT VIC AB 3-0 SH 8-18 (SUTURE) IMPLANT
SUT VICRYL RAPIDE 3 0 (SUTURE) IMPLANT
SYR 30ML SLIP (SYRINGE) ×1 IMPLANT
SYSTEM CSF EXTERNAL DRAINAGE (MISCELLANEOUS) IMPLANT
TAPE PAPER 1X10 WHT MICROPORE (GAUZE/BANDAGES/DRESSINGS) ×1 IMPLANT
TAPE PAPER 2X10 WHT MICROPORE (GAUZE/BANDAGES/DRESSINGS) ×1 IMPLANT
TOWEL GREEN STERILE (TOWEL DISPOSABLE) ×1 IMPLANT
TOWEL GREEN STERILE FF (TOWEL DISPOSABLE) ×1 IMPLANT
TRAY FOLEY MTR SLVR 16FR STAT (SET/KITS/TRAYS/PACK) ×1 IMPLANT
TUBE CONNECTING 20X1/4 (TUBING) ×1 IMPLANT
TUBING FEATHERFLOW (TUBING) IMPLANT
WATER STERILE IRR 1000ML POUR (IV SOLUTION) ×1 IMPLANT

## 2024-01-25 NOTE — ED Triage Notes (Addendum)
 Pt reports colonoscopy and endoscopy on Thursday last week. Stumbling around thursday night once home from procedure. On Friday 0430 woke up dizzy, poor balance, dragging left foot  and noted difficulty typing due to left hand.. Denies dizziness or HA at this time. Mild weakness to left arm and leg

## 2024-01-25 NOTE — Progress Notes (Signed)
 I spoke to patient directly by phone today regarding GE junction/gastric cardia adenocarcinoma and CT scan findings  Unfortunately he has had a fall, he does not recall when, but has developed a subdural hematoma with shift.  He is at [facility] currently and will be transferred for neurosurgical care  He needs to be referred to Dr. Leighton Punches and medical oncology to be seen   Small adenoma of the colon, please place recall colonoscopy for 7 years  He also needs an EUS and I will message Dr. Brice Campi directly but this will need to be after his subdural hematoma is completely managed  Phone call in lieu of pathology letter

## 2024-01-25 NOTE — Transfer of Care (Signed)
 Immediate Anesthesia Transfer of Care Note  Patient: Matthew Lee  Procedure(s) Performed: CRANIOTOMY HEMATOMA EVACUATION SUBDURAL (Right: Head)  Patient Location: PACU  Anesthesia Type:General  Level of Consciousness: awake, alert , and pateint uncooperative  Airway & Oxygen Therapy: Patient Spontanous Breathing  Post-op Assessment: Report given to RN and Post -op Vital signs reviewed and stable  Post vital signs: Reviewed and stable  Last Vitals:  Vitals Value Taken Time  BP 121/70 01/25/24 2006  Temp 98   Pulse 99 01/25/24 2009  Resp 18 01/25/24 2009  SpO2 96 % 01/25/24 2009  Vitals shown include unfiled device data.  Last Pain:  Vitals:   01/25/24 1737  TempSrc: Oral  PainSc: 0-No pain         Complications: No notable events documented.

## 2024-01-25 NOTE — Anesthesia Procedure Notes (Signed)
 Procedure Name: Intubation Date/Time: 01/25/2024 6:37 PM  Performed by: Merna Aase, CRNAPre-anesthesia Checklist: Patient identified, Patient being monitored, Timeout performed, Emergency Drugs available and Suction available Patient Re-evaluated:Patient Re-evaluated prior to induction Oxygen Delivery Method: Circle system utilized Preoxygenation: Pre-oxygenation with 100% oxygen Induction Type: IV induction Ventilation: Mask ventilation without difficulty Laryngoscope Size: Mac and 4 Grade View: Grade I Tube type: Oral Tube size: 7.5 mm Number of attempts: 1 Airway Equipment and Method: Stylet Placement Confirmation: ETT inserted through vocal cords under direct vision, positive ETCO2 and breath sounds checked- equal and bilateral Secured at: 23 cm Tube secured with: Tape Dental Injury: Teeth and Oropharynx as per pre-operative assessment

## 2024-01-25 NOTE — Hospital Course (Signed)
 Matthew Lee is a 68 year old male with history of hepatitis C, iron deficiency anemia, HTN, HLD, presented to ED for chief complaining of feeling off balance when walking not be able to pick up his left foot while ambulating.  Difficulty typing for past few days.  Weakness especially in the left side, with no speech difficulties. Denies of having any numbness. Symptoms started post EGD/colonoscopy 5 days ago for GE junction carcinoma   ED:Per EDP evaluation: Blood pressure 117/66, pulse 83, temperature 98.6 F (37 C), resp. rate 18, weight 70.3 kg, SpO2 99% on RA.  Per EDP patient is coherent awake alert oriented, left-sided weakness unsteady gait was noted.  CBC CMP reviewed CO2 21, glucose 111, hemoglobin 11.2,    CT head IMPRESSION: 1. Large and mostly isodense Right Subdural Hematoma, estimated up to 23 mm thickness, and probably septated. 2. Intracranial mass effect with leftward midline shift of 9 mm. 3. No skull fracture or other acute intracranial abnormalities    EDP: has called Neurosurgery; Matthew Golden PA-C,  discussed lab and imaging findings as well as pertinent plan - they recommend: start on Keppra 500 mg BID. Get MRI brain without contrast and admit to medicine.    Discussed the case with Dr. Gordon Latus -who is seeing the patient in concert with his PA-C Matthew Lee.   Based on this presentation we recommend patient to be admitted to neuro ICU under intensive care observation. Close follow-up with neurosurgery for possible intervention

## 2024-01-25 NOTE — H&P (Signed)
 NAME:  Matthew Lee, MRN:  [id number], DOB:  05/05/1956, LOS: 0 ADMISSION DATE:  01/25/2024, CONSULTATION DATE:  01/25/24 REFERRING MD:  Dr. Gordon Latus, CHIEF COMPLAINT:  dizziness    History of Present Illness:   10 yoM with PMH as below significant for CAD, HTN, HFpEF, thoracic aortic aneurysm, HLD, chronic HCV s/p treatment, IDA  Presented to [facility] ER with complaints of feeling off balance when walking, mild left arm and leg weakness unable to lift left foot, and difficulty typing since waking up 4/25.  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 No known falls however pt does not remember much after procedure.  Not on any anticoagulates.  Denies changes in vision, speech, confusion, or headaches.     In ER, afebrile, hemodynamically stable.  CMET unremarkable except bicarb 21, glucose 111, normal coags, CBC at baseline, H/H 11.2/ 35.3.  CT head showed a large and mostly isodense right SDH, up to 23mm thickness and suspected septated with leftward midline shift of 9mm.  NSGY was consulted and will see on admit with recommendations for transfer to Neuro ICU, MRI brain, and empiric keppra. PCCM to admit for medical management while in ICU.   Pertinent  Medical History   Past Medical History:  Diagnosis Date   Abnormal weight loss 11/02/2022   Aneurysm of thoracic aorta (HCC) 09/2022   dilated to 4 cm on CT   Cholelithiasis 11/02/2022   Colon cancer screening 07/31/2022   Coronary artery calcification 10/08/2022   Diverticulitis    Dyspnea on exertion 11/11/2022   Elevated PSA 07/31/2022   Essential hypertension 09/26/2009   Qualifier: Diagnosis of   By: Luster Salters MD, Sheilda Deputy 11/11/2022   Gall bladder polyp 02/18/2015   Overview:   - 6 mm (as described from US  abd in 2015). Repeat Us  abd lim ordered.   GERD (gastroesophageal reflux disease) 09/07/2013   Hepatitis C 08/24/2012   TX and cured  per pt   History of COVID-19 07/31/2022   History of hepatitis C 11/11/2022   History of tobacco abuse    Hyperglycemia 11/22/2014   Hypertension    long standing since 2007, was treated for sometime, but then discontinued taking meds on his own.   Immunization counseling 04/01/2022   Insomnia 10/31/2009   Qualifier: Diagnosis of   By: Evlyn Hoffmann MD, Amanjot       Mild diastolic dysfunction 08/22/2012   Mixed dyslipidemia 11/11/2022   Preventative health care 12/31/2014   Prominent abdominal aortic pulsation 11/11/2022   Pulmonary edema, acute (HCC)    hospitalized in 09/2009 with hypertensive crisis and acute pulmonary edema- improved with lasix  nad labetalol .   Significant Hospital Events: Including procedures, antibiotic start and stop dates in addition to other pertinent events   4/29 admitted to [facility] w/ SDH  Interim History / Subjective:  See above  Objective   Blood pressure 117/66, pulse 83, temperature 98.6 F (37 C), resp. rate 18, weight 70.3 kg, SpO2 99%.        Intake/Output Summary (Last 24 hours) at 01/25/2024 1445 Last data filed at 01/25/2024 1415 Gross per 24 hour  Intake 100 ml  Output --  Net 100 ml   Filed Weights   01/25/24 1148  Weight: 70.3 kg    Examination: General:  NAD HEENT: MM pink/moist; right sided dressing on head Neuro: Aox3; MAE CV: s1s2, RRR, no m/r/g PULM:  dim clear BS bilaterally; [place]  GI: soft, bsx4 active  Extremities: warm/dry, no edema  Skin: no rashes or lesions    Resolved Hospital Problem list    Assessment & Plan:   Right Subdural Hematoma with leftward MLS 9 mm s/p crani 4/29 - isodense on CTH, suspected septated, no other fx or acute intracranial abnormalities - denies recent injury/ falls but doesn't recall much after his procedure 4/24 and symptoms started 4/25.  Given recent findings of malignancy, will need to be ruled out P:  - admit for Neuro ICU monitoring, serial neuro exams - NSG following; appreciate  recs - SBP goal per nsg - MRI brain without contrast or CTH head for any significant neuro changes  - empiric Keppra 500mg  BID/ seizure precautions - SCD - PT/OT/SLP   HTN HFpEF HLD CAD P: - prn labetalol  - resume losartan , labetalol , hydrochlorothiazide , amlodipine   - resume statin - hold asa  GERD P: - PPI   Iron deficiency anemia P: - H/H at baseline, cont to monitor  - resume home iron  Malignant gastric tumor  P: - per EGD 4/24> at GE junction and in cardia, non obstructing but narrowing at GE junction.   - referred to GI to Dr. Deloise Ferries (appt 02/04/24) and medical oncology, s/p CT chest/ abd/ pelvis 4/25  Best Practice (right click and "Reselect all SmartList Selections" daily)   Diet/type: NPO DVT prophylaxis SCD Pressure ulcer(s): N/A GI prophylaxis: N/A Lines: N/A Foley:  N/A Code Status:  full code Last date of multidisciplinary goals of care discussion [4/30 patient updated at bedside; called wife Nellie Banas and updated]  Labs   CBC: Recent Labs  Lab 01/25/24 1202  WBC 8.7  NEUTROABS 6.0  HGB 11.2*  HCT 35.3*  MCV 79.9*  PLT 354    Basic Metabolic Panel: Recent Labs  Lab 01/25/24 1202  NA 135  K 4.0  CL 100  CO2 21*  GLUCOSE 111*  BUN 17  CREATININE 0.97  CALCIUM  9.4   GFR: Estimated Creatinine Clearance: 67.2 mL/min (by C-G formula based on SCr of 0.97 mg/dL). Recent Labs  Lab 01/25/24 1202  WBC 8.7    Liver Function Tests: Recent Labs  Lab 01/25/24 1202  AST 23  ALT 16  ALKPHOS 117  BILITOT 0.2  PROT 7.2  ALBUMIN 4.3   No results for input(s): "LIPASE", "AMYLASE" in the last 168 hours. No results for input(s): "AMMONIA" in the last 168 hours.  ABG    Component Value Date/Time   PHART 7.388 09/24/2009 0754   PCO2ART 38.0 09/24/2009 0754   PO2ART 80.0 09/24/2009 0754   HCO3 22.9 09/24/2009 0754   TCO2 24 09/24/2009 0754   ACIDBASEDEF 2.0 09/24/2009 0754   O2SAT 96.0 09/24/2009 0754     Coagulation  Profile: Recent Labs  Lab 01/25/24 1202  INR 1.0    Cardiac Enzymes: No results for input(s): "CKTOTAL", "CKMB", "CKMBINDEX", "TROPONINI" in the last 168 hours.  HbA1C: Hgb A1c MFr Bld  Date/Time Value Ref Range Status  07/31/2022 07:48 AM 5.8 4.6 - 6.5 % Final    Comment:    Glycemic Control Guidelines for People with Diabetes:Non Diabetic:  <6%Goal of Therapy: <7%Additional Action Suggested:  >8%   04/01/2022 07:08 AM 5.8 4.6 - 6.5 % Final    Comment:    Glycemic Control Guidelines for People with Diabetes:Non Diabetic:  <6%Goal of Therapy: <7%Additional Action Suggested:  >8%     CBG: Recent Labs  Lab 01/25/24 1149  GLUCAP 135*  Review of Systems:   Review of Systems  Constitutional:  Negative for fever.  Respiratory:  Negative for shortness of breath.   Cardiovascular:  Negative for chest pain.  Neurological:  Positive for headaches. Negative for weakness.     Past Medical History:  He,  has a past medical history of Abnormal weight loss (11/02/2022), Aneurysm of thoracic aorta (HCC) (09/2022), Cholelithiasis (11/02/2022), Colon cancer screening (07/31/2022), Coronary artery calcification (10/08/2022), Diverticulitis, Dyspnea on exertion (11/11/2022), Elevated PSA (07/31/2022), Essential hypertension (09/26/2009), Ex-smoker (11/11/2022), Gall bladder polyp (02/18/2015), GERD (gastroesophageal reflux disease) (09/07/2013), Hepatitis C (08/24/2012), History of COVID-19 (07/31/2022), History of hepatitis C (11/11/2022), History of tobacco abuse, Hyperglycemia (11/22/2014), Hypertension, Immunization counseling (04/01/2022), Insomnia (10/31/2009), Mild diastolic dysfunction (08/22/2012), Mixed dyslipidemia (11/11/2022), Preventative health care (12/31/2014), Prominent abdominal aortic pulsation (11/11/2022), and Pulmonary edema, acute (HCC).   Surgical History:   Past Surgical History:  Procedure Laterality Date   ESOPHAGOGASTRODUODENOSCOPY  08/21/2011   Procedure:  ESOPHAGOGASTRODUODENOSCOPY (EGD);  Surgeon: Tami Falcon, MD;  Location: [facility];  Service: Endoscopy;  Laterality: N/A;   TONSILLECTOMY       Social History:   reports that he quit smoking about 14 years ago. His smoking use included cigarettes. He started smoking about 54 years ago. He has a 40 pack-year smoking history. He has never used smokeless tobacco. He reports that he does not currently use alcohol. He reports that he does not use drugs.   Family History:  His family history includes Polycystic kidney disease in his father, half-brother, half-brother, and half-sister. There is no history of Colon cancer, Esophageal cancer, Stomach cancer, or Pancreatic cancer.   Allergies No Known Allergies   Home Medications  Prior to Admission medications   Medication Sig Start Date End Date Taking? Authorizing Provider  amLODipine  (NORVASC ) 10 MG tablet TAKE 1 TABLET DAILY 12/15/23   Dorrene Gaucher, NP  atorvastatin  (LIPITOR) 10 MG tablet Take 1 tablet (10 mg total) by mouth daily. 12/23/23 03/22/24  Revankar, Micael Adas, MD  ferrous sulfate  325 (65 FE) MG tablet Take 1 tablet (325 mg total) by mouth every other day. 12/18/23   O'Sullivan, Melissa, NP  hydrochlorothiazide  (HYDRODIURIL ) 25 MG tablet Take 1 tablet (25 mg total) by mouth daily. 12/15/23   O'Sullivan, Melissa, NP  labetalol  (NORMODYNE ) 100 MG tablet Take 1.5 tablets (150 mg total) by mouth in the morning and at bedtime. 12/15/23   O'Sullivan, Melissa, NP  losartan  (COZAAR ) 50 MG tablet Take 2 tablets (100 mg total) by mouth daily. 12/15/23   O'Sullivan, Melissa, NP  ondansetron  (ZOFRAN ) 4 MG tablet Take 1 tablet (4 mg total) by mouth every 8 (eight) hours as needed for nausea or vomiting. 12/17/23   O'Sullivan, Melissa, NP  pantoprazole  (PROTONIX ) 40 MG tablet Take 1 tablet (40 mg total) by mouth 2 (two) times daily. 01/04/24   McMichael, Elba Greathouse, PA-C  zolpidem  (AMBIEN ) 5 MG tablet Take 1 tablet (5 mg total) by mouth at bedtime as  needed for sleep. 01/21/24   Dorrene Gaucher, NP     Critical care time: 45 minutes      JD Carliss Chess [facility] 01/26/2024, 12:50 AM  Please see [email or web address] for pager details.  From 7A-7P if no response, please call [phone]. After hours, please call [facility] [phone].

## 2024-01-25 NOTE — ED Notes (Signed)
 Pt states last PO intake was approx 0600 this morning

## 2024-01-25 NOTE — Anesthesia Procedure Notes (Signed)
 Arterial Line Insertion Start/End4/29/2025 7:05 PM, 01/25/2024 7:06 PM Performed by: Robert Chimes, CRNA, CRNA  Patient location: Pre-op. Preanesthetic checklist: patient identified, IV checked, site marked, risks and benefits discussed, surgical consent, monitors and equipment checked, pre-op evaluation, timeout performed and anesthesia consent Lidocaine  1% used for infiltration Left, radial was placed Catheter size: 20 G Hand hygiene performed  and maximum sterile barriers used   Attempts: 1 Procedure performed without using ultrasound guided technique. Following insertion, dressing applied and Biopatch. Post procedure assessment: normal and unchanged  Patient tolerated the procedure well with no immediate complications.

## 2024-01-25 NOTE — ED Notes (Signed)
 [facility] called for transport to [facility].  Spoke with OR Nurse @16 :10

## 2024-01-25 NOTE — ED Provider Notes (Signed)
 [place] [facility] Provider Note   CSN: [id number] Arrival date & time: 01/25/24  1129     History  Chief Complaint  Patient presents with   Dizziness   Gait Problem    Matthew Lee is a 68 y.o. male with a history of hepatitis C, iron deficiency anemia, and GERD who presents to the ED today for disequilibrium.  Patient reports feeling unbalanced with walking, not being able to pick up his left foot completely with ambulating, and difficulty with typing for the past 5 days.  Symptoms have not improved since onset.  States that he had a colonoscopy and endoscopy the day prior for GE junction carcinoma.  Denies recent known falls - does not remember much from Thursday night after going home after the procedure second to anesthesia.No vision changes, slurred speech, confusion, or headaches.     Home Medications Prior to Admission medications   Medication Sig Start Date End Date Taking? Authorizing Provider  amLODipine  (NORVASC ) 10 MG tablet TAKE 1 TABLET DAILY 12/15/23   O'Sullivan, Melissa, NP  atorvastatin  (LIPITOR) 10 MG tablet Take 1 tablet (10 mg total) by mouth daily. 12/23/23 03/22/24  Revankar, Micael Adas, MD  ferrous sulfate  325 (65 FE) MG tablet Take 1 tablet (325 mg total) by mouth every other day. 12/18/23   O'Sullivan, Melissa, NP  hydrochlorothiazide  (HYDRODIURIL ) 25 MG tablet Take 1 tablet (25 mg total) by mouth daily. 12/15/23   O'Sullivan, Melissa, NP  labetalol  (NORMODYNE ) 100 MG tablet Take 1.5 tablets (150 mg total) by mouth in the morning and at bedtime. 12/15/23   O'Sullivan, Melissa, NP  losartan  (COZAAR ) 50 MG tablet Take 2 tablets (100 mg total) by mouth daily. 12/15/23   O'Sullivan, Melissa, NP  ondansetron  (ZOFRAN ) 4 MG tablet Take 1 tablet (4 mg total) by mouth every 8 (eight) hours as needed for nausea or vomiting. 12/17/23   Dorrene Gaucher, NP  pantoprazole  (PROTONIX ) 40 MG tablet Take 1 tablet (40 mg total) by mouth 2 (two)  times daily. 01/04/24   McMichael, Elba Greathouse, PA-C  zolpidem  (AMBIEN ) 5 MG tablet Take 1 tablet (5 mg total) by mouth at bedtime as needed for sleep. 01/21/24   O'Sullivan, Melissa, NP      Allergies    Patient has no known allergies.    Review of Systems   Review of Systems  Neurological:  Positive for dizziness.  All other systems reviewed and are negative.   Physical Exam Updated Vital Signs BP (!) 131/105   Pulse 84   Temp 98.6 F (37 C)   Resp 16   Wt 70.3 kg   SpO2 97%   BMI 24.28 kg/m  Physical Exam Vitals and nursing note reviewed.  Constitutional:      General: He is not in acute distress.    Appearance: Normal appearance.  HENT:     Head: Normocephalic and atraumatic.     Mouth/Throat:     Mouth: Mucous membranes are moist.  Eyes:     Conjunctiva/sclera: Conjunctivae normal.     Pupils: Pupils are equal, round, and reactive to light.  Cardiovascular:     Rate and Rhythm: Normal rate and regular rhythm.     Pulses: Normal pulses.     Heart sounds: Normal heart sounds.  Pulmonary:     Effort: Pulmonary effort is normal.     Breath sounds: Normal breath sounds.  Abdominal:     Palpations: Abdomen is soft.  Tenderness: There is no abdominal tenderness.  Musculoskeletal:        General: Normal range of motion.     Cervical back: Normal range of motion.  Skin:    General: Skin is warm and dry.     Findings: No Oquin.  Neurological:     Mental Status: He is alert.     Cranial Nerves: No cranial nerve deficit.     Sensory: No sensory deficit.     Comments: CN V and VII intact bilaterally. Grip strength R > L. Left foot drop present with ambulation.   Psychiatric:        Mood and Affect: Mood normal.        Behavior: Behavior normal.    ED Results / Procedures / Treatments   Labs (all labs ordered are listed, but only abnormal results are displayed) Labs Reviewed  CBC - Abnormal; Notable for the following components:      Result Value   Hemoglobin  11.2 (*)    HCT 35.3 (*)    MCV 79.9 (*)    MCH 25.3 (*)    All other components within normal limits  COMPREHENSIVE METABOLIC PANEL WITH GFR - Abnormal; Notable for the following components:   CO2 21 (*)    Glucose, Bld 111 (*)    All other components within normal limits  CBG MONITORING, ED - Abnormal; Notable for the following components:   Glucose-Capillary 135 (*)    All other components within normal limits  PROTIME-INR  APTT  DIFFERENTIAL  ETHANOL  CBG MONITORING, ED    EKG EKG Interpretation Date/Time:  Tuesday January 25 2024 12:06:15 EDT Ventricular Rate:  79 PR Interval:  140 QRS Duration:  98 QT Interval:  386 QTC Calculation: 443 R Axis:   87  Text Interpretation: Sinus rhythm Borderline right axis deviation Confirmed by Jerald Molly [phone]) on 01/25/2024 12:39:12 PM  Radiology CT HEAD WO CONTRAST Addendum Date: 01/25/2024 ADDENDUM REPORT: 01/25/2024 13:14 ADDENDUM: Study discussed by telephone with Dr. Jerald Molly on 01/25/2024 at 1302 hours. Electronically Signed   By: Marlise Simpers M.D.   On: 01/25/2024 13:14   Result Date: 01/25/2024 CLINICAL DATA:  68 year old male with altered mental status, dizziness, decreased balance, neurologic deficit, syncope. EXAM: CT HEAD WITHOUT CONTRAST TECHNIQUE: Contiguous axial images were obtained from the base of the skull through the vertex without intravenous contrast. RADIATION DOSE REDUCTION: This exam was performed according to the departmental dose-optimization program which includes automated exposure control, adjustment of the mA and/or kV according to patient size and/or use of iterative reconstruction technique. COMPARISON:  None Available. FINDINGS: Brain: Large and mostly isodense extra-axial hemorrhage in the right hemisphere with thickness estimated up to 23 mm on coronal images (series 5, image 43). Probable internal septations (series 2, image 23. Relatively small volume of hyperdense blood products within the  collection (series 2, image 15). Intracranial mass effect with leftward midline shift of 9 mm. Partially effaced suprasellar cistern. Other basilar cisterns maintained. Compressed right lateral ventricle. No ventriculomegaly. No discrete cytotoxic or cerebral edema is identified. Vascular: Calcified atherosclerosis at the skull base. No suspicious intracranial vascular hyperdensity. Skull: Intact, no fracture identified. Sinuses/Orbits: Visualized paranasal sinuses and mastoids are clear. Other: No acute orbit or scalp soft tissue finding. IMPRESSION: 1. Large and mostly isodense Right Subdural Hematoma, estimated up to 23 mm thickness, and probably septated. 2. Intracranial mass effect with leftward midline shift of 9 mm. 3. No skull fracture or other acute intracranial abnormality identified.  Electronically Signed: By: Marlise Simpers M.D. On: 01/25/2024 12:55    Procedures .Critical Care  Performed by: Sonnie Dusky, PA-C Authorized by: Sonnie Dusky, PA-C   Critical care provider statement:    Critical care time (minutes):  40   Critical care was necessary to treat or prevent imminent or life-threatening deterioration of the following conditions:  CNS failure or compromise   Critical care was time spent personally by me on the following activities:  Blood draw for specimens, development of treatment plan with patient or surrogate, discussions with consultants, evaluation of patient's response to treatment, examination of patient, obtaining history from patient or surrogate, review of old charts, re-evaluation of patient's condition, pulse oximetry, ordering and review of radiographic studies, ordering and review of laboratory studies and ordering and performing treatments and interventions   Care discussed with: admitting provider       Medications Ordered in ED Medications  sodium chloride  flush (NS) 0.9 % injection 3 mL (3 mLs Intravenous Not Given 01/25/24 1229)  levETIRAcetam (KEPPRA) IVPB 500  mg/100 mL premix (0 mg Intravenous Stopped 01/25/24 1415)  acetaminophen (TYLENOL) tablet 650 mg (650 mg Oral Given 01/25/24 1526)    ED Course/ Medical Decision Making/ A&P Clinical Course as of 01/25/24 1615  Tue Jan 25, 2024  1315 This is a 67 year old male presented to ED with complaint of balance difficulty, difficulty with his fingers and fine motor skills, and weakness in his left hand, ongoing for about 5 days.  He says this began roughly around the time that he had anesthesia for a colonoscopy on Thursday.  He says he was really groggy from anesthesia and "does not remember much of the night".  He wonders whether he may have fallen although does not remember this specifically.  He has not had any headaches.  Here in the ED his vital signs are normal, blood test largely unremarkable.  He had a CT imaging concerning for a right-sided subdural bleed which may be partially subacute, with some midline herniation.  On exam his speech is clear, he has no evident cranial nerve deficits or visual deficits.  He does have weakness to left grip strength and left hip flexion and lower extremity flexion 4 out of 5 strength, compared to the right extremity.  No sensory deficits.  He does have a slow and shuffling and unsteady gait.  Unclear etiology of likely subdural bleed, possibly trauma from a head injury or a fall 5 days.  He was diagnosed also with recent potential gastric malignancy, I do not see evidence brain met pattern but he may need MRI on admission to hospital.  PA provider paging and will discuss with neurosurgeon [MT]    Clinical Course User Index [MT] Trifan, Janalyn Me, MD                                 Medical Decision Making Amount and/or Complexity of Data Reviewed Labs: ordered. Radiology: ordered.  Risk Prescription drug management. Decision regarding hospitalization.   This patient presents to the ED for concern of disequilibrium, this involves an extensive number of  treatment options, and is a complaint that carries with it a high risk of complications and morbidity.   Differential diagnosis includes: SAH, SDH, EDH, concussion, atypical migraine, etc.   Comorbidities  See HPI above   Additional History  Additional history obtained from prior records   Cardiac Monitoring / EKG  The patient  was maintained on a cardiac monitor.  I personally viewed and interpreted the cardiac monitored which showed: sinus rhythm with a heart rate of 78 bpm.   Lab Tests  I ordered and personally interpreted labs.  The pertinent results include:   CBG of 135 Hemoglobin of 11.2, otherwise CBC and CMP are reassuring Ethanol <10   Imaging Studies  CT head ordered in triage I independently visualized and interpreted imaging which showed:  Large, mostly isodense right subdural hematoma - estimated up to 23 mm thickness, probably septated. Intracranial mass effect with leftward shift of 9 mm. No skull fracture or other acute intracranial abnormalities identified. I agree with the radiologist interpretation   Consultations  I requested consultation with Easter Golden PA-C with neurosurgery,  and discussed lab and imaging findings as well as pertinent plan - they recommend: start on Keppra 500 mg BID. Get MRI brain without contrast and admit to medicine. I requested consultation with Dr. Marene Shape with CCU,  and discussed lab and imaging findings as well as pertinent plan - they recommend: admission.   Problem List / ED Course / Critical Interventions / Medication Management  Patient endorses disequilibrium, difficulty typing on a keyboard and inability to pick up his left foot completely with ambulation for the last 4 days. Had an endoscopy and colonoscopy the day prior for gastric carcinoma and thought it was a side effect of the anesthesia at first. Does not remember getting home Thursday afternoon, unsure if he had any falls but does not believe so. Takes  baby aspirin but hasn't taken any today. No vision changes, confusion, or slurred speech. Patient staffed with my attending, Dr. Gordon Latus, who also independently evaluated patient. Patient last ate at 10:30 AM this morning. I ordered medications including: Keppra  I have reviewed the patients home medicines and have made adjustments as needed   Social Determinants of Health  Social connection   Test / Admission - Considered  Discussed findings with patient. He is agreeable with plan for admission.       Final Clinical Impression(s) / ED Diagnoses Final diagnoses:  Subdural hematoma [facility]    Rx / DC Orders ED Discharge Orders     None         Sonnie Dusky, PA-C 01/25/24 1615    Arvilla Birmingham, MD 01/26/24 [phone]

## 2024-01-25 NOTE — Progress Notes (Signed)
 Matthew Lee is a 68 year old male with history of hepatitis C, iron deficiency anemia, HTN, HLD, presented to ED for chief complaining of feeling off balance when walking not be able to pick up his left foot while ambulating.  Difficulty typing for past few days.  Weakness especially in the left side, with no speech difficulties. Denies of having any numbness. 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 Patient reporting no improvement to the current symptoms   ED:Per EDP evaluation: Blood pressure 117/66, pulse 83, temperature 98.6 F (37 C), resp. rate 18, weight 70.3 kg, SpO2 99% on RA.  Per EDP patient is coherent awake alert oriented, left-sided weakness unsteady gait was noted.  CBC CMP reviewed CO2 21, glucose 111, hemoglobin 11.2,    CT head IMPRESSION: 1. Large and mostly isodense Right Subdural Hematoma, estimated up to 23 mm thickness, and probably septated. 2. Intracranial mass effect with leftward midline shift of 9 mm. 3. No skull fracture or other acute intracranial abnormalities    EDP: has called Neurosurgery; Easter Golden PA-C,  discussed lab and imaging findings as well as pertinent plan - they recommend: start on Keppra 500 mg BID. Get MRI brain without contrast.   Discussed the case with EDP Dr. Gordon Latus -who is seeing the patient in concert with his PA-C Sonnie Dusky.   Based on this presentation we recommend patient to be admitted to neuro ICU under intensive care observation. Close follow-up with neurosurgery for repeat imaging and possible intervention.    SIGNED: Bobbetta Burnet, MD, FHM. FAAFP [facility],  Pager (please use [email or web address] to page/text)  Please use Epic Secure Chat for non-urgent communication (7AM-7PM) If 7PM-7AM, please contact night-coverage [email or web address],  01/25/2024, 2:31 PM

## 2024-01-25 NOTE — Op Note (Signed)
 Procedure(s): CRANIOTOMY HEMATOMA EVACUATION SUBDURAL Procedure Note  Matthew Lee male 68 y.o. 01/25/2024  Procedure(s) and Anesthesia Type:    * CRANIOTOMY HEMATOMA EVACUATION SUBDURAL - General  Surgeon(s) and Role:    Andy Bannister, Cleatrice Curl, MD - Primary      Indications: This is a 68 y.o. male with hx of hep C underwent endoscopic biopsy of gastoesophageal mass 1 week ago and developed increasing, headache, weakness, and ambulatory difficulty.  He was found to have a large right subdural hematoma.  Risks, benefits, alternatives, and expected convalescence were discussed with the patient and family.  Risks discussed included but were not limited to bleeding, pain, infection, seizure, stroke, scar, subdural recurrence, neurologic deficit, coma, and death.  Informed consent was obtained.  Surgeon: Van Gelinas   Assistants: Easter Golden, PA.  She  provided critical assistance throughout the case.  No qualified trainees were available to assist with the procedure.  Anesthesia: General endotracheal anesthesia   Procedure in detail:  The patient was brought to the operating room.  A timeout was performed.  General anesthesia was induced and patient was intubated by the anesthesia service.  After appropriate lines and monitors were placed, patient's head was turned to the right and scalp was shaved, preprepped with alcohol and cleansing solution and prepped and draped in sterile fashion.  1% lidocaine  with epinephrine injected into the planned incision.  A linear incision was made with a 10 blade and monopolar electrocautery was used to open the galea .  The periosteum was dissected from the skull.  Bur holes were placed and were used to dissect the dura from the inner table of the skull.  Craniotome was used to perform a craniotomy.  Meticulous epidural hemostasis obtained.  The middle meningeal artery was identified and coagulated to help reduce risk of recurrence.  The dura was then  opened in C shape manner and flapped toward the midline.  Subdural membrane was encountered which was resected and sent to pathology given his recent diagnosis of malignancy, though it appeared like typical subdural membrane.  Significant volume of chronic and subacute blood was evacuated.  Septations under the bone edge were coagulated and cut.  There was thick membrane noted over the vein of trolard toward the midline which was potentially the initial site of venous blood leakage.    Subdural space was irrigated thoroughly to ensure no active bleeding and the irrigation returned clear.  Subdural drain was placed and tunneled out the skin and secured with a stitch.  The dura was closed with 4-0 Nurolon with a DuraGen onlay placed on top of that.  The bone flap was replaced with Stryker plating system.  The galea was closed with 2-0 Vicryl sutures in buried fashion.  Skin was closed with staples.  Sterile dressing was then placed on the incision.  Patient was then extubated by the anesthesia service moving all extremities.  All counts were correct at the end of surgery.  No complications were noted.  Findings: Large subacute/chronic subdural hematoma  Estimated Blood Loss:  50 ml         Drains: subdural drain         Specimens: subdural membrane         Implants: Strykerplating        Complications:  * No complications entered in OR log *         Disposition: PACU - hemodynamically stable.         Condition: stable

## 2024-01-25 NOTE — Consult Note (Signed)
 CC: weakness, wobbliness  HPI:     Patient is a 68 y.o. male with hx of hep C underwent endoscopic biopsy of gastoesophageal mass 1 week ago.  Since then, he reports some headache, increased weakness in his left side, wobbliness with walking.  He believes he fell and possibly hit his head in the recovery area after the biopsy but cannot clearly remember this.  He has taken baby aspirin for his headache, including this morning.  He has had some weight loss for the past several months.    Patient Active Problem List   Diagnosis Date Noted   Subdural hematoma (HCC) 01/25/2024   Iron deficiency anemia 12/18/2023   Ex-smoker 11/11/2022   History of hepatitis C 11/11/2022   Mixed dyslipidemia 11/11/2022   Dyspnea on exertion 11/11/2022   Prominent abdominal aortic pulsation 11/11/2022   Diverticulitis 11/02/2022   Hypertension 11/02/2022   Pulmonary edema, acute (HCC) 11/02/2022   Abnormal weight loss 11/02/2022   Cholelithiasis 11/02/2022   Coronary artery calcification 10/08/2022   Aneurysm of thoracic aorta (HCC) 09/2022   Elevated PSA 07/31/2022   History of COVID-19 07/31/2022   Colon cancer screening 07/31/2022   Immunization counseling 04/01/2022   Gall bladder polyp 02/18/2015   Preventative health care 12/31/2014   Hyperglycemia 11/22/2014   GERD (gastroesophageal reflux disease) 09/07/2013   Hepatitis C 08/24/2012   Mild diastolic dysfunction 08/22/2012   Insomnia 10/31/2009   History of tobacco abuse 09/26/2009   Essential hypertension 09/26/2009   Past Medical History:  Diagnosis Date   Abnormal weight loss 11/02/2022   Aneurysm of thoracic aorta (HCC) 09/2022   dilated to 4 cm on CT   Cholelithiasis 11/02/2022   Colon cancer screening 07/31/2022   Coronary artery calcification 10/08/2022   Diverticulitis    Dyspnea on exertion 11/11/2022   Elevated PSA 07/31/2022   Essential hypertension 09/26/2009   Qualifier: Diagnosis of   By: Luster Salters MD, Sheilda Deputy 11/11/2022   Gall bladder polyp 02/18/2015   Overview:   - 6 mm (as described from US  abd in 2015). Repeat Us  abd lim ordered.   GERD (gastroesophageal reflux disease) 09/07/2013   Hepatitis C 08/24/2012   TX and cured per pt   History of COVID-19 07/31/2022   History of hepatitis C 11/11/2022   History of tobacco abuse    Hyperglycemia 11/22/2014   Hypertension    long standing since 2007, was treated for sometime, but then discontinued taking meds on his own.   Immunization counseling 04/01/2022   Insomnia 10/31/2009   Qualifier: Diagnosis of   By: Evlyn Hoffmann MD, Amanjot       Mild diastolic dysfunction 08/22/2012   Mixed dyslipidemia 11/11/2022   Preventative health care 12/31/2014   Prominent abdominal aortic pulsation 11/11/2022   Pulmonary edema, acute (HCC)    hospitalized in 09/2009 with hypertensive crisis and acute pulmonary edema- improved with lasix  nad labetalol .    Past Surgical History:  Procedure Laterality Date   ESOPHAGOGASTRODUODENOSCOPY  08/21/2011   Procedure: ESOPHAGOGASTRODUODENOSCOPY (EGD);  Surgeon: Tami Falcon, MD;  Location: [facility];  Service: Endoscopy;  Laterality: N/A;   TONSILLECTOMY      Medications Prior to Admission  Medication Sig Dispense Refill Last Dose/Taking   amLODipine  (NORVASC ) 10 MG tablet TAKE 1 TABLET DAILY 90 tablet 3 01/24/2024   atorvastatin  (LIPITOR) 10 MG tablet Take 1 tablet (10 mg total) by mouth daily. 90 tablet 3 01/25/2024 at  6:00 AM   ferrous  sulfate 325 (65 FE) MG tablet Take 1 tablet (325 mg total) by mouth every other day.   01/25/2024 at  6:00 AM   hydrochlorothiazide  (HYDRODIURIL ) 25 MG tablet Take 1 tablet (25 mg total) by mouth daily. 90 tablet 3 01/25/2024 at  6:00 AM   labetalol  (NORMODYNE ) 100 MG tablet Take 1.5 tablets (150 mg total) by mouth in the morning and at bedtime. 21 tablet 0 01/25/2024 at  6:00 AM   losartan  (COZAAR ) 50 MG tablet Take 2 tablets (100 mg total) by mouth daily. 90 tablet 1 01/25/2024 at   6:00 AM   pantoprazole  (PROTONIX ) 40 MG tablet Take 1 tablet (40 mg total) by mouth 2 (two) times daily. 60 tablet 2 01/25/2024 at  6:00 AM   zolpidem  (AMBIEN ) 5 MG tablet Take 1 tablet (5 mg total) by mouth at bedtime as needed for sleep. 7 tablet 0 01/24/2024   ondansetron  (ZOFRAN ) 4 MG tablet Take 1 tablet (4 mg total) by mouth every 8 (eight) hours as needed for nausea or vomiting. 20 tablet 0    No Known Allergies  Social History   Tobacco Use   Smoking status: Former    Current packs/day: 0.00    Average packs/day: 1 pack/day for 40.0 years (40.0 ttl pk-yrs)    Types: Cigarettes    Start date: 09/23/1969    Quit date: 09/23/2009    Years since quitting: 14.3   Smokeless tobacco: Never  Substance Use Topics   Alcohol use: Not Currently    Family History  Problem Relation Age of Onset   Polycystic kidney disease Father    Polycystic kidney disease Half-Brother    Polycystic kidney disease Half-Brother    Polycystic kidney disease Half-Sister    Colon cancer Neg Hx    Esophageal cancer Neg Hx    Stomach cancer Neg Hx    Pancreatic cancer Neg Hx      Review of Systems Pertinent items are noted in HPI.  Objective:   Patient Vitals for the past 8 hrs:  BP Temp Temp src Pulse Resp SpO2 Weight  01/25/24 1737 (!) 149/91 98.7 F (37.1 C) Oral 81 17 97 % --  01/25/24 1615 133/72 -- -- 85 16 99 % --  01/25/24 1530 (!) 131/105 -- -- 84 16 97 % --  01/25/24 1430 (!) 111/49 -- -- 79 20 94 % --  01/25/24 1400 -- 98.6 F (37 C) -- 83 18 -- --  01/25/24 1345 117/66 -- -- 73 20 99 % --  01/25/24 1315 119/77 -- -- 73 17 100 % --  01/25/24 1148 -- -- -- -- -- -- 70.3 kg  01/25/24 1144 138/78 98.6 F (37 C) -- 83 16 99 % --   No intake/output data recorded. Total I/O In: 100 [IV Piggyback:100] Out: -       General : Alert, cooperative, no distress, appears stated age   Head:  Normocephalic/atraumatic    Eyes: PERRL, conjunctiva/corneas clear, EOM's intact. Fundi could not  be visualized Neck: Supple Chest:  Respirations unlabored Chest wall: no tenderness or deformity Heart: Regular rate and rhythm Abdomen: Soft, nontender and nondistended Extremities: warm and well-perfused Skin: normal turgor, color and texture Neurologic:  Alert, oriented x 3.  Eyes open spontaneously. PERRL, EOMI, VFC, no facial droop. V1-3 intact.  No dysarthria, tongue protrusion symmetric.  CNII-XII intact. Left pronator drift, subtle left leg weakness.  No facial asymmetry.       Data ReviewCBC:  Lab Results  Component  Value Date   WBC 8.7 01/25/2024   RBC 4.42 01/25/2024   BMP:  Lab Results  Component Value Date   GLUCOSE 111 (H) 01/25/2024   CO2 21 (L) 01/25/2024   BUN 17 01/25/2024   CREATININE 0.97 01/25/2024   CREATININE 0.85 01/21/2018   CALCIUM  9.4 01/25/2024   Radiology review:  CT head without contrast personally reviewed.  Extraaxial isodense lesion/material/fluid noted over right convexity with 7 mm MLS.  Some septations noted.  Assessment:   Principal Problem:   Subdural hematoma (HCC)   Plan:   -  I had a long discussion with the patient and family.  Given the mass effect and neurologic symptoms, I am recommending evacuation of the subdural hematoma via small craniotomy given presence of septations.  No intracranial mass is seen, but with his recent possible cancer diagnosis, there is small possibility of this representing extraaxial hemorrhagic neoplasm, in which case this surgery would involve biopsy.  Risks, benefits, alternatives, and expected convalescence were discussed with patient and family.  Risks discussed included but were not limited to, bleeding, pain, infection, scar, spinal fluid leak, pseudomeningocele, neurologic deficit, nausea, vomiting, and death.  Informed consent was obtained.

## 2024-01-25 NOTE — Anesthesia Preprocedure Evaluation (Signed)
 Anesthesia Evaluation  Patient identified by MRN, date of birth, ID band Patient awake    Reviewed: Allergy & Precautions, H&P , NPO status , Patient's Chart, lab work & pertinent test results  Airway Mallampati: II  TM Distance: >3 FB Neck ROM: Full    Dental  (+) Upper Dentures, Lower Dentures   Pulmonary neg pulmonary ROS, former smoker   Pulmonary exam normal breath sounds clear to auscultation       Cardiovascular hypertension, (-) angina + CAD  (-) Past MI Normal cardiovascular exam Rhythm:Regular Rate:Normal     Neuro/Psych neg Seizures Subdural hematoma following a fall 1 week ago. Symptoms since then include loss of balance and weakness in the left arm.   negative psych ROS   GI/Hepatic ,GERD  ,,(+) Hepatitis -, CNewly diagnosed stomach mass on EGD 1 week ago   Endo/Other  negative endocrine ROS    Renal/GU negative Renal ROS  negative genitourinary   Musculoskeletal negative musculoskeletal ROS (+)    Abdominal   Peds negative pediatric ROS (+)  Hematology  (+) Blood dyscrasia, anemia   Anesthesia Other Findings   Reproductive/Obstetrics negative OB ROS                             Anesthesia Physical Anesthesia Plan  ASA: 3  Anesthesia Plan: General   Post-op Pain Management:    Induction: Intravenous  PONV Risk Score and Plan: 2 and Ondansetron , Dexamethasone and Treatment may vary due to age or medical condition  Airway Management Planned: Oral ETT  Additional Equipment: None  Intra-op Plan:   Post-operative Plan: Extubation in OR  Informed Consent: I have reviewed the patients History and Physical, chart, labs and discussed the procedure including the risks, benefits and alternatives for the proposed anesthesia with the patient or authorized representative who has indicated his/her understanding and acceptance.     Dental advisory given  Plan Discussed  with: CRNA  Anesthesia Plan Comments:        Anesthesia Quick Evaluation

## 2024-01-26 ENCOUNTER — Inpatient Hospital Stay (HOSPITAL_COMMUNITY)

## 2024-01-26 ENCOUNTER — Other Ambulatory Visit: Payer: Self-pay

## 2024-01-26 ENCOUNTER — Encounter: Payer: Self-pay | Admitting: *Deleted

## 2024-01-26 ENCOUNTER — Encounter (HOSPITAL_COMMUNITY): Payer: Self-pay | Admitting: Neurosurgery

## 2024-01-26 DIAGNOSIS — R599 Enlarged lymph nodes, unspecified: Secondary | ICD-10-CM

## 2024-01-26 DIAGNOSIS — I10 Essential (primary) hypertension: Secondary | ICD-10-CM

## 2024-01-26 DIAGNOSIS — S065XAA Traumatic subdural hemorrhage with loss of consciousness status unknown, initial encounter: Secondary | ICD-10-CM

## 2024-01-26 DIAGNOSIS — D509 Iron deficiency anemia, unspecified: Secondary | ICD-10-CM

## 2024-01-26 DIAGNOSIS — I5032 Chronic diastolic (congestive) heart failure: Secondary | ICD-10-CM

## 2024-01-26 DIAGNOSIS — K219 Gastro-esophageal reflux disease without esophagitis: Secondary | ICD-10-CM

## 2024-01-26 DIAGNOSIS — C169 Malignant neoplasm of stomach, unspecified: Secondary | ICD-10-CM

## 2024-01-26 LAB — BASIC METABOLIC PANEL WITH GFR
Anion gap: 8 (ref 5–15)
BUN: 10 mg/dL (ref 8–23)
CO2: 21 mmol/L — ABNORMAL LOW (ref 22–32)
Calcium: 8.3 mg/dL — ABNORMAL LOW (ref 8.9–10.3)
Chloride: 101 mmol/L (ref 98–111)
Creatinine, Ser: 0.79 mg/dL (ref 0.61–1.24)
GFR, Estimated: >60 mL/min (ref >60)
Glucose, Bld: 126 mg/dL — ABNORMAL HIGH (ref 70–99)
Potassium: 3.8 mmol/L (ref 3.5–5.1)
Sodium: 130 mmol/L — ABNORMAL LOW (ref 135–145)

## 2024-01-26 LAB — PREPARE PLATELET PHERESIS: Unit division: 0

## 2024-01-26 LAB — CBC
HCT: 30.5 % — ABNORMAL LOW (ref 39.0–52.0)
Hemoglobin: 9.7 g/dL — ABNORMAL LOW (ref 13.0–17.0)
MCH: 25 pg — ABNORMAL LOW (ref 26.0–34.0)
MCHC: 31.8 g/dL (ref 30.0–36.0)
MCV: 78.6 fL — ABNORMAL LOW (ref 80.0–100.0)
Platelets: 295 10*3/uL (ref 150–400)
RBC: 3.88 MIL/uL — ABNORMAL LOW (ref 4.22–5.81)
RDW: 13.9 % (ref 11.5–15.5)
WBC: 11.6 10*3/uL — ABNORMAL HIGH (ref 4.0–10.5)
nRBC: 0 % (ref 0.0–0.2)

## 2024-01-26 LAB — GLUCOSE, CAPILLARY
Glucose-Capillary: 124 mg/dL — ABNORMAL HIGH (ref 70–99)
Glucose-Capillary: 125 mg/dL — ABNORMAL HIGH (ref 70–99)
Glucose-Capillary: 127 mg/dL — ABNORMAL HIGH (ref 70–99)
Glucose-Capillary: 131 mg/dL — ABNORMAL HIGH (ref 70–99)
Glucose-Capillary: 131 mg/dL — ABNORMAL HIGH (ref 70–99)
Glucose-Capillary: 139 mg/dL — ABNORMAL HIGH (ref 70–99)

## 2024-01-26 LAB — BPAM PLATELET PHERESIS
Blood Product Expiration Date: 202505022359
ISSUE DATE / TIME: 202504291905
Unit Type and Rh: 6200

## 2024-01-26 LAB — HIV ANTIBODY (ROUTINE TESTING W REFLEX): HIV Screen 4th Generation wRfx: NONREACTIVE

## 2024-01-26 MED ORDER — LEVETIRACETAM 500 MG PO TABS
500.0000 mg | ORAL_TABLET | Freq: Two times a day (BID) | ORAL | Status: DC
  Administered 2024-01-26 – 2024-01-29 (×6): 500 mg via ORAL
  Filled 2024-01-26 (×6): qty 1

## 2024-01-26 NOTE — Progress Notes (Signed)
 Neurosurgery  No headaches, feels left strength is better  Sanguinous drainage from wound.  Minimal left pronator drift Dressing c/d A+Ox3  S/p R crani for SDH - ct head this morning - cont drain

## 2024-01-26 NOTE — Evaluation (Signed)
 Clinical/Bedside Swallow Evaluation Patient Details  Name: Matthew Lee MRN: [id number] Date of Birth: [birth year]  Today's Date: 01/26/2024 Time: SLP Start Time (ACUTE ONLY): 4098 SLP Stop Time (ACUTE ONLY): 0947 SLP Time Calculation (min) (ACUTE ONLY): 23 min  Past Medical History:  Past Medical History:  Diagnosis Date   Abnormal weight loss 11/02/2022   Aneurysm of thoracic aorta (HCC) 09/2022   dilated to 4 cm on CT   Cholelithiasis 11/02/2022   Colon cancer screening 07/31/2022   Coronary artery calcification 10/08/2022   Diverticulitis    Dyspnea on exertion 11/11/2022   Elevated PSA 07/31/2022   Essential hypertension 09/26/2009   Qualifier: Diagnosis of   By: Luster Salters MD, Sheilda Deputy 11/11/2022   Gall bladder polyp 02/18/2015   Overview:   - 6 mm (as described from US  abd in 2015). Repeat Us  abd lim ordered.   GERD (gastroesophageal reflux disease) 09/07/2013   Hepatitis C 08/24/2012   TX and cured per pt   History of COVID-19 07/31/2022   History of hepatitis C 11/11/2022   History of tobacco abuse    Hyperglycemia 11/22/2014   Hypertension    long standing since 2007, was treated for sometime, but then discontinued taking meds on his own.   Immunization counseling 04/01/2022   Insomnia 10/31/2009   Qualifier: Diagnosis of   By: Evlyn Hoffmann MD, Amanjot       Mild diastolic dysfunction 08/22/2012   Mixed dyslipidemia 11/11/2022   Preventative health care 12/31/2014   Prominent abdominal aortic pulsation 11/11/2022   Pulmonary edema, acute (HCC)    hospitalized in 09/2009 with hypertensive crisis and acute pulmonary edema- improved with lasix  nad labetalol .   Past Surgical History:  Past Surgical History:  Procedure Laterality Date   ESOPHAGOGASTRODUODENOSCOPY  08/21/2011   Procedure: ESOPHAGOGASTRODUODENOSCOPY (EGD);  Surgeon: Tami Falcon, MD;  Location: [facility];  Service: Endoscopy;  Laterality: N/A;   TONSILLECTOMY     HPI:  68 yo male presenting  4/29 with L weakness and reduced balance. CT Head showed a large R SDH with 9mm leftward MLS s/p crani 4/29. Pt had recent EGD 4/24 that revealed reflux related changes to esophagus as well as a large, fungating and ulcerated, three-quarter circumferential mass with oozing bleeding at the GE junction and in the cardia, which was biopsied. PMH also includes: esophageal impaction from meat requiring EGD for retrieval (2-3x per pt estimate), CAD, HTN, HFpEF, thoracic aortic aneurysm, HLD, chronic HCV s/p treatment, IDA    Assessment / Plan / Recommendation  Clinical Impression  Pt has some chronic esophageal issues, including GERD and h/o several food impactions that he attributes to having false teeth and not always chewing meat thoroughly. He reports increased signs of esophageal dysphagia in light of newly found mass at his GE junction. He describes several precautions that he uses, including slow rate, small bites, and careful mastication, and that as long as he uses these precautions he swallows well. He denies any trouble getting pills down. If he does not use his precautions, he starts to feel like he is "backing up" in his esophagus and he will make himself regurgitate. SLP provided education about also staying upright for a while after meals and not eating too closely to bedtime (he often eats right before bed). He seems to be managing his symptoms well overall and does not show any overt signs of an oropharyngeal dysphagia so SLP to s/o for swallowing.   Note that cognitive-linguistic eval  has not been ordered, but did screen for need in light of SDH. Pt did not notice acute changes with cognition or communication, but acknowledges that he is feeling much clearer post-op and likely wasn't aware of changes he was experiencing. Simple screening seems functional and pt was discussed with PT/OT to watch for cognition within functional contexts/mobility. MD can consider ordering SLP cognitive-linguistic eval  if needed.   SLP Visit Diagnosis: Dysphagia, unspecified (R13.10)    Aspiration Risk       Diet Recommendation Regular;Thin liquid    Liquid Administration via: Cup;Straw Medication Administration: Whole meds with liquid Supervision: Patient able to self feed Compensations: Slow rate;Small sips/bites;Follow solids with liquid;Minimize environmental distractions Postural Changes: Seated upright at 90 degrees;Remain upright for at least 30 minutes after po intake    Other  Recommendations Oral Care Recommendations: Oral care BID    Recommendations for follow up therapy are one component of a multi-disciplinary discharge planning process, led by the attending physician.  Recommendations may be updated based on patient status, additional functional criteria and insurance authorization.  Follow up Recommendations No SLP follow up      Assistance Recommended at Discharge    Functional Status Assessment Patient has not had a recent decline in their functional status  Frequency and Duration            Prognosis        Swallow Study   General HPI: 68 yo male presenting 4/29 with L weakness and reduced balance. CT Head showed a large R SDH with 9mm leftward MLS s/p crani 4/29. Pt had recent EGD 4/24 that revealed reflux related changes to esophagus as well as a large, fungating and ulcerated, three-quarter circumferential mass with oozing bleeding at the GE junction and in the cardia, which was biopsied. PMH also includes: esophageal impaction from meat requiring EGD for retrieval (2-3x per pt estimate), CAD, HTN, HFpEF, thoracic aortic aneurysm, HLD, chronic HCV s/p treatment, IDA Type of Study: Bedside Swallow Evaluation Previous Swallow Assessment: none in chart Diet Prior to this Study: Regular;Thin liquids (Level 0) Temperature Spikes Noted: No Respiratory Status: Room air History of Recent Intubation: Yes (for procedure) Behavior/Cognition: Alert;Cooperative;Pleasant mood Oral  Cavity Assessment: Within Functional Limits Oral Care Completed by SLP: No Oral Cavity - Dentition: Dentures, top;Dentures, bottom Vision: Functional for self-feeding Self-Feeding Abilities: Able to feed self Patient Positioning: Upright in bed Baseline Vocal Quality: Low vocal intensity (mild) Volitional Cough: Strong Volitional Swallow: Able to elicit    Oral/Motor/Sensory Function Overall Oral Motor/Sensory Function: Within functional limits   Ice Chips Ice chips: Not tested   Thin Liquid Thin Liquid: Within functional limits Presentation: Cup;Self Fed;Straw    Nectar Thick Nectar Thick Liquid: Not tested   Honey Thick Honey Thick Liquid: Not tested   Puree Puree: Within functional limits Presentation: Self Fed;Spoon   Solid     Solid: Within functional limits Presentation: Self Fed      Beth Brooke., M.A. CCC-SLP Acute Rehabilitation Services Office: [phone]  Secure chat preferred  01/26/2024,9:57 AM

## 2024-01-26 NOTE — Progress Notes (Signed)
 NAME:  Matthew Lee, MRN:  [id number], DOB:  [birth year], LOS: 1 ADMISSION DATE:  01/25/2024, CONSULTATION DATE:  01/25/2024 REFERRING MD:  Gordon Latus - EDP, CHIEF COMPLAINT: Dizziness    History of Present Illness:  68 year old man who presented to [facility] 4/29 as a transfer from [facility] for unilateral L-sided weakness and gait disturbance. PMHx significant for HTN, HLD, CAD, HFpEF, thoracic aortic aneurysm, chronic HCV s/p treatment, IDA.  Presented to [facility] ER with complaints of feeling off balance when walking, mild left arm and leg weakness unable to lift left foot, and difficulty typing since waking up 4/25.  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 No known falls, however patient does not remember much after procedure.  Not on any anticoagulants. Denies changes in vision, speech, confusion, or headaches.     In ER, afebrile, hemodynamically stable.  CMET unremarkable except bicarb 21, glucose 111, normal coags, CBC at baseline, H/H 11.2/ 35.3.  CT Head showed a large and mostly isodense right SDH (up to 23mm thickness) likely septated with leftward midline shift of 9mm.  NSGY was consulted and will see on admit with recommendations for transfer to Neuro ICU, MRI brain, and empiric keppra. PCCM to admit for medical management while in ICU.   Pertinent Medical History:   Past Medical History:  Diagnosis Date   Abnormal weight loss 11/02/2022   Aneurysm of thoracic aorta (HCC) 09/2022   dilated to 4 cm on CT   Cholelithiasis 11/02/2022   Colon cancer screening 07/31/2022   Coronary artery calcification 10/08/2022   Diverticulitis    Dyspnea on exertion 11/11/2022   Elevated PSA 07/31/2022   Essential hypertension 09/26/2009   Qualifier: Diagnosis of   By: Luster Salters MD, Sheilda Deputy 11/11/2022   Gall bladder polyp 02/18/2015   Overview:   - 6 mm (as described from US  abd in 2015). Repeat Us  abd lim  ordered.   GERD (gastroesophageal reflux disease) 09/07/2013   Hepatitis C 08/24/2012   TX and cured per pt   History of COVID-19 07/31/2022   History of hepatitis C 11/11/2022   History of tobacco abuse    Hyperglycemia 11/22/2014   Hypertension    long standing since 2007, was treated for sometime, but then discontinued taking meds on his own.   Immunization counseling 04/01/2022   Insomnia 10/31/2009   Qualifier: Diagnosis of   By: Evlyn Hoffmann MD, Amanjot       Mild diastolic dysfunction 08/22/2012   Mixed dyslipidemia 11/11/2022   Preventative health care 12/31/2014   Prominent abdominal aortic pulsation 11/11/2022   Pulmonary edema, acute (HCC)    hospitalized in 09/2009 with hypertensive crisis and acute pulmonary edema- improved with lasix  nad labetalol .   Significant Hospital Events: Including procedures, antibiotic start and stop dates in addition to other pertinent events   4/29 Admitted to [facility] w/ SDH  Interim History / Subjective:  Admitted overnight for SDH POD#0 from R crani Feeling well overall, strength feels about back to baseline Mild HA (resolved with APAP) CT Head with interval R frontal crani for evac of large R-sided SDH, drain in place, reduction of subdural blood with less mass effect, R to L shift 5mm (8mm) Working with PT/OT  Objective:  Blood pressure 128/77, pulse 73, temperature 98 F (36.7 C), temperature source Oral, resp. rate 12, weight 68.9 kg, SpO2 95%.    FiO2 (%):  [28 %]  28 %   Intake/Output Summary (Last 24 hours) at 01/26/2024 0750 Last data filed at 01/26/2024 0700 Gross per 24 hour  Intake 1963.52 ml  Output 1475 ml  Net 488.52 ml   Filed Weights   01/25/24 1148 01/26/24 0500  Weight: 70.3 kg 68.9 kg   Physical Examination: General: Acutely ill-appearing older man in NAD. HEENT: [place], anicteric sclera, PERRL, moist mucous membranes. R crani incision clean/dry/intact with dressing in place. Neuro: Awake, oriented x 4. Responds to  verbal stimuli. Following commands consistently. Moves all 4 extremities spontaneously. Strength slightly decreased on left but nearing baseline.  CV: RRR, no m/g/r. PULM: Breathing even and unlabored on RA. Lung fields CTAB. GI: Soft, nontender, nondistended. Normoactive bowel sounds. Extremities: No significant LE edema noted. Skin: Warm/dry, no rashes.  Resolved Hospital Problem List:     Assessment & Plan:   Right Subdural Hematoma with leftward MLS 9 mm S/p crani 4/29 Isodense on CTH, suspected septated, no other fx or acute intracranial abnormalities; denies recent injury/falls but doesn't recall much after EGD procedure 4/24 and symptoms started 4/25.  Given recent findings of malignancy, will need to be ruled out. CT Head with interval R frontal crani for evac of large R-sided SDH, drain in place, reduction of subdural blood with less mass effect, R to L shift 5mm (8mm). - NSGY following, appreciate recommendations - POD#1 from crani - SBP goal < 160 - Keppra for seizure ppx - Multimodal pain management - Frequent neuro exams - Seizure precautions - PT/OT - Outpatient speech referral placed per OT recommendations  HTN HFpEF HLD CAD - Resume home losartan , labetalol , hydrochlorothiazide , amlodipine  - Labetalol  PRN - Resume ASA/statin  GERD - PPI  Iron deficiency anemia - Trend H&H - Monitor for signs of active bleeding - Transfuse for Hgb < 7.0 or hemodynamically significant bleeding - Resume home Fe supplement  Malignant gastric tumor  EGD 4/24 demonstrated tumor at GE junction and in cardia, nonobstructing but narrowing at GE junction.   - GI engaged, considering EUS when medically appropriate - R axillary node present, may be best first target for percutaneous biopsy/FNA to r/o metastasis - Recommend outpatient GI f/u  Best Practice (right click and "Reselect all SmartList Selections" daily)   Diet/type: NPO DVT prophylaxis SCD Pressure ulcer(s): N/A GI  prophylaxis: N/A Lines: N/A Foley:  N/A Code Status:  full code Last date of multidisciplinary goals of care discussion [4/30 patient/wife updated at bedside]  Critical care time: N/A   Genoveva Kidney [facility] 01/26/24 7:51 AM  Please see [email or web address] for pager details.  From 7A-7P if no response, please call [phone] After hours, please call ELink [phone]

## 2024-01-26 NOTE — TOC Initial Note (Signed)
 Transition of Care [facility]) - Initial/Assessment Note    Patient Details  Name: Matthew Lee MRN: [id number] Date of Birth: [birth year]  Transition of Care [facility]) CM/SW Contact:    Omie Bickers, RN Phone Number: 01/26/2024, 3:18 PM  Clinical Narrative:                  Patient admitted from home with wife. Referral placed to [facility] for SLP services. Added to AVS.  No DME needs identified for DC. Expected Discharge Plan: Home/Self Care Barriers to Discharge: Continued Medical Work up   Patient Goals and CMS Choice            Expected Discharge Plan and Services   Discharge Planning Services: CM Consult   Living arrangements for the past 2 months: Single Family Home                 DME Arranged: N/A                    Prior Living Arrangements/Services Living arrangements for the past 2 months: Single Family Home Lives with:: Spouse                   Activities of Daily Living      Permission Sought/Granted                  Emotional Assessment              Admission diagnosis:  Subdural hematoma (HCC) [S06.5XAA] Patient Active Problem List   Diagnosis Date Noted   Subdural hematoma (HCC) 01/25/2024   Iron deficiency anemia 12/18/2023   Ex-smoker 11/11/2022   History of hepatitis C 11/11/2022   Mixed dyslipidemia 11/11/2022   Dyspnea on exertion 11/11/2022   Prominent abdominal aortic pulsation 11/11/2022   Diverticulitis 11/02/2022   Hypertension 11/02/2022   Pulmonary edema, acute (HCC) 11/02/2022   Abnormal weight loss 11/02/2022   Cholelithiasis 11/02/2022   Coronary artery calcification 10/08/2022   Aneurysm of thoracic aorta (HCC) 09/2022   Elevated PSA 07/31/2022   History of COVID-19 07/31/2022   Colon cancer screening 07/31/2022   Immunization counseling 04/01/2022   Gall bladder polyp 02/18/2015   Preventative health care 12/31/2014   Hyperglycemia 11/22/2014   GERD (gastroesophageal reflux disease)  09/07/2013   Hepatitis C 08/24/2012   Mild diastolic dysfunction 08/22/2012   Insomnia 10/31/2009   History of tobacco abuse 09/26/2009   Essential hypertension 09/26/2009   PCP:  Dorrene Gaucher, NP Pharmacy:   [facility] DELIVERY - [place], [place] - [address] [place] [address] Phone: [phone] Fax: [phone]  [id number] - [place], [place] - [address] AT [facility] [address] [place] [address] Phone: [phone] Fax: [phone]     Social Drivers of Health (SDOH) Social History: SDOH Screenings   Food Insecurity: No Food Insecurity (01/25/2024)  Housing: Low Risk  (01/25/2024)  Transportation Needs: No Transportation Needs (01/25/2024)  Utilities: Not At Risk (01/25/2024)  Alcohol Screen: Low Risk  (05/29/2021)  Depression (PHQ2-9): Low Risk  (12/15/2023)  Financial Resource Strain: Low Risk  (12/15/2023)  Physical Activity: Sufficiently Active (12/15/2023)  Social Connections: Socially Isolated (01/25/2024)  Stress: Stress Concern Present (12/15/2023)  Tobacco Use: Medium Risk (01/25/2024)   SDOH Interventions:     Readmission Risk Interventions     No data to display

## 2024-01-26 NOTE — Progress Notes (Signed)
 Chart reviewed, patient not seen  Matthew Lee is my patient from recent upper endoscopy and colonoscopy We have identified a proximal gastric/GE junction adenocarcinoma Imaging concerning for regional lymph node involvement; we are considering EUS when medically appropriate post neurosurgery.  He also has a right axillary node which may be the best first target for percutaneous biopsy/FNA to see if there is any evidence of metastatic disease.  Again this will need to be when medically appropriate but may be possible before he leaves the hospital.  Please reach out to the GI consult service prior to discharge Thank you

## 2024-01-26 NOTE — Evaluation (Signed)
 Occupational Therapy Evaluation Patient Details Name: Matthew Lee MRN: [id number] DOB: 1956/01/23 Today's Date: 01/26/2024   History of Present Illness   68 yo male s/p 4/29 Craniotomy hematoma evacuation SDH. Pt reports difficulty walking, LUE/ LE weakness since 4/25 CT (+) R SDH 23 mm and L midline shift 9mm PMh hep C underwent endoscopic biopsy of gastoesophageal mass 01/20/24, CAD, HTN, Thoracic aortic aneurysm, HLD, chronic HCV s/p treatment, IDA,     Clinical Impressions Patient evaluated by Occupational Therapy with no further acute OT needs identified. All education has been completed and the patient has no further questions. See below for any follow-up Occupational Therapy or equipment needs. OT to sign off. Thank you for referral.   Call me Matthew Lee     If plan is discharge home, recommend the following:   Direct supervision/assist for financial management;Direct supervision/assist for medications management     Functional Status Assessment   Patient has had a recent decline in their functional status and demonstrates the ability to make significant improvements in function in a reasonable and predictable amount of time.     Equipment Recommendations   None recommended by OT     Recommendations for Other Services   Speech consult (outpatient needs only)     Precautions/Restrictions   Precautions Precautions: Fall Restrictions Weight Bearing Restrictions Per Provider Order: No     Mobility Bed Mobility Overal bed mobility: Independent                  Transfers Overall transfer level: Independent                        Balance Overall balance assessment: Modified Independent                                         ADL either performed or assessed with clinical judgement   ADL Overall ADL's : Needs assistance/impaired Eating/Feeding: Modified independent                                      General ADL Comments: pt demonstrates static standing at computer using the keyboard to type with some spelling errors self correcting. pt then decided he wanted to write "code" in writing code he required returning to the task and adding details. This is a code that he reports is a "basic" code. pt demonstrates decrease cognition in high levels of executive functioning. p t is agreeabel to outpatient speech for this need. Need communicated to Attending MD and CM team. Pt s wife present and educated to help with bills and medication management     Vision Baseline Vision/History: 1 Wears glasses Ability to See in Adequate Light: 0 Adequate Vision Assessment?: No apparent visual deficits     Perception         Praxis         Pertinent Vitals/Pain Pain Assessment Pain Assessment: No/denies pain     Extremity/Trunk Assessment Upper Extremity Assessment Upper Extremity Assessment: Overall WFL for tasks assessed   Lower Extremity Assessment Lower Extremity Assessment: Overall WFL for tasks assessed   Cervical / Trunk Assessment Cervical / Trunk Assessment: Normal   Communication Communication Communication: No apparent difficulties   Cognition Arousal: Alert Behavior During Therapy: WFL for tasks assessed/performed Cognition: Cognition impaired  Awareness: Intellectual awareness intact, Online awareness impaired     Executive functioning impairment (select all impairments): Problem solving, Organization OT - Cognition Comments: pt presented with dual task during movement and demonstrates change in gait speed and retrieval of information speed. pt was challenged with naming animals for each animal of the alphabet. pt was stuck on A is for.... Pt needed OT to help start the task and then became stuck at "K" Pt asking wife to name an animal with "k" without any context to the question.                 Following commands: Intact       Cueing  General Comments    Cueing Techniques: Verbal cues  RA   Exercises     Shoulder Instructions      Home Living Family/patient expects to be discharged to:: Private residence Living Arrangements: Spouse/significant other (Matthew Lee) Available Help at Discharge: Family Type of Home: House Home Access: Stairs to enter [employer] of Steps: 2 Entrance Stairs-Rails: None Home Layout: Two level;Bed/bath upstairs;Full bath on main level (walk in shower on main) Alternate Level Stairs-Number of Steps: 12 Alternate Level Stairs-Rails: Left Bathroom Shower/Tub: Chief Strategy Officer: Standard     Home Equipment: Agricultural consultant (2 wheels);Cane - single point;Wheelchair - Training and development officer CommentsLawyer, cat named Matthew Lee      Prior Functioning/Environment Prior Level of Function : Independent/Modified Independent;Working/employed;Driving                    OT Problem List:     OT Treatment/Interventions:        OT Goals(Current goals can be found in the care plan section)   Acute Rehab OT Goals Patient Stated Goal: to return to work next week. pt educated on deficits and still reports he needs to work next week   OT Frequency:       Co-evaluation PT/OT/SLP Co-Evaluation/Treatment: Yes Reason for Co-Treatment: For patient/therapist safety;To address functional/ADL transfers;Necessary to address cognition/behavior during functional activity   OT goals addressed during session: ADL's and self-care;Strengthening/ROM      AM-PAC OT "6 Clicks" Daily Activity     Outcome Measure Help from another person eating meals?: None Help from another person taking care of personal grooming?: None Help from another person toileting, which includes using toliet, bedpan, or urinal?: None Help from another person bathing (including washing, rinsing, drying)?: None Help from another person to put on and taking off regular upper body  clothing?: None Help from another person to put on and taking off regular lower body clothing?: None 6 Click Score: 24   End of Session Nurse Communication: Mobility status;Precautions  Activity Tolerance: Patient tolerated treatment well Patient left: in chair;with call bell/phone within reach;with chair alarm set;with family/visitor present  OT Visit Diagnosis: Unsteadiness on feet (R26.81)                Time: 8119-1478 OT Time Calculation (min): 40 min Charges:  OT General Charges $OT Visit: 1 Visit OT Evaluation $OT Eval Moderate Complexity: 1 Mod   Brynn, OTR/L  Acute Rehabilitation Services Office: [phone] .   Neomia Banner 01/26/2024, 12:20 PM

## 2024-01-26 NOTE — Evaluation (Signed)
 Physical Therapy Evaluation & Discharge Patient Details  Name: Matthew Lee MRN: [id number] DOB: 1956/07/21 Today's Date: 01/26/2024  History of Present Illness  68 yo male who presented 4/29 with difficulty walking, LUE/ LE weakness since he underwent endoscopic biopsy of gastoesophageal mass 1 week ago. CT (+) R SDH 23 mm and L midline shift 9 mm. S/p craniotomy hematoma evacuation SDH 4/29. PMh hep C, underwent endoscopic biopsy of gastoesophageal mass 01/20/24, CAD, HTN, Thoracic aortic aneurysm, HLD, chronic HCV s/p treatment, IDA   Clinical Impression  Pt presents with condition above. PTA, he was independent without DME, working, and living with his wife in a 2-level house with 2 STE. Currently, the pt is demonstrating WFL, intact, and seemingly symmetrical bil lower extremity strength, sensation, and coordination. He is able to perform all functional mobility without LOB, assistance, or DME. He appears to be at his functional baseline. However, he demonstrates deficits in higher level cognition, having difficulty dual tasking at times. He may benefit from a SLP cognitive assessment. All education completed and questions answered. PT will sign off.        If plan is discharge home, recommend the following: Supervision due to cognitive status;Assist for transportation   Can travel by private vehicle        Equipment Recommendations None recommended by PT  Recommendations for Other Services  Speech consult (OP SLP for cognition)    Functional Status Assessment Patient has had a recent decline in their functional status and demonstrates the ability to make significant improvements in function in a reasonable and predictable amount of time.     Precautions / Restrictions Precautions Precautions: Other (comment) Precaution/Restrictions Comments: EVD (clamp when mobilizing) Restrictions Weight Bearing Restrictions Per Provider Order: No      Mobility  Bed Mobility Overal bed  mobility: Modified Independent             General bed mobility comments: HOB elevated, no assistance needed to transition supine to sit EOB    Transfers Overall transfer level: Independent Equipment used: None               General transfer comment: No LOB standing from EOB    Ambulation/Gait Ambulation/Gait assistance: Supervision, Independent Gait Distance (Feet): 470 Feet Assistive device: None Gait Pattern/deviations: WFL(Within Functional Limits) Gait velocity: WFL     General Gait Details: Pt ambulates without LOB or drastic gait deviations. He does slow his gait some when performing dual cognitive tasks while trying to perform gait challenges though. Able to change head positions, directions, and speeds without LOB. Supervision for safety but capable of being independent  Stairs Stairs: Yes Stairs assistance: Contact guard assist Stair Management: No rails, Alternating pattern, Forwards Number of Stairs: 4 General stair comments: Ascends and descends stairs without LOB or assistance, CGA for safety  Wheelchair Mobility     Tilt Bed    Modified Rankin (Stroke Patients Only) Modified Rankin (Stroke Patients Only) Pre-Morbid Rankin Score: No symptoms Modified Rankin: Slight disability (cognition only)     Balance Overall balance assessment: Modified Independent                                           Pertinent Vitals/Pain Pain Assessment Pain Assessment: No/denies pain    Home Living Family/patient expects to be discharged to:: Private residence Living Arrangements: Spouse/significant other (Mrs [name]) Available Help at Discharge: Family  Type of Home: House Home Access: Stairs to enter Entrance Stairs-Rails: None Entrance Stairs-Number of Steps: 2 Alternate Level Stairs-Number of Steps: 12 Home Layout: Two level;Bed/bath upstairs;Full bath on main level (walk in shower on main) Home Equipment: Rolling Walker (2  wheels);Cane - single point;Wheelchair - Metallurgist CommentsLawyer, cat named Daisey    Prior Function Prior Level of Function : Independent/Modified Independent;Working/employed;Driving             Mobility Comments: No AD 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 Extremity Assessment Upper Extremity Assessment: Defer to OT evaluation    Lower Extremity Assessment Lower Extremity Assessment: Overall WFL for tasks assessed (MMT scores of 5 grossly bil; sensation and coordination intact bil)    Cervical / Trunk Assessment Cervical / Trunk Assessment: Normal  Communication   Communication Communication: No apparent difficulties    Cognition Arousal: Alert Behavior During Therapy: WFL for tasks assessed/performed   PT - Cognitive impairments: Attention, Memory, Awareness                       PT - Cognition Comments: Pt had difficulty initiating recalling names of animals that started with each letter of the alphabet, needing assistance to start and then intermittently; able to recall first x2 commands of x3 command sequence; slows gait or slows cog task when dual tasking Following commands: Intact       Cueing Cueing Techniques: Verbal cues     General Comments General comments (skin integrity, edema, etc.): VSS on RA; educated pt and wife on his cog deficits and need to go slow, get help at work, and monitor for errors at work as needed    Exercises     Assessment/Plan    PT Assessment Patient does not need any further PT services  PT Problem List         PT Treatment Interventions      PT Goals (Current goals can be found in the Care Plan section)  Acute Rehab PT Goals Patient Stated Goal: to go back to work PT Goal Formulation: All assessment and education complete, DC therapy Time For Goal Achievement: 01/27/24 Potential to Achieve Goals: Good    Frequency        Co-evaluation PT/OT/SLP Co-Evaluation/Treatment: Yes Reason for Co-Treatment: For patient/therapist safety;To address functional/ADL transfers;Necessary to address cognition/behavior during functional activity PT goals addressed during session: Balance;Mobility/safety with mobility OT goals addressed during session: ADL's and self-care;Strengthening/ROM       AM-PAC PT "6 Clicks" Mobility  Outcome Measure Help needed turning from your back to your side while in a flat bed without using bedrails?: None Help needed moving from lying on your back to sitting on the side of a flat bed without using bedrails?: None Help needed moving to and from a bed to a chair (including a wheelchair)?: None Help needed standing up from a chair using your arms (e.g., wheelchair or bedside chair)?: None Help needed to walk in hospital room?: None Help needed climbing 3-5 steps with a railing? : A Little 6 Click Score: 23    End of Session Equipment Utilized During Treatment: Gait belt Activity Tolerance: Patient tolerated treatment well Patient left: in chair;with call bell/phone within reach;with chair alarm set;with family/visitor present Nurse Communication: Mobility status PT Visit Diagnosis: Other symptoms and signs involving the nervous system (R29.898)    Time: 1610-9604 PT Time Calculation (min) (ACUTE ONLY): 34 min  Charges:   PT Evaluation $PT Eval Moderate Complexity: 1 Mod   PT General Charges $$ ACUTE PT VISIT: 1 Visit         Vernida Goodie, PT, DPT Acute Rehabilitation Services  Office: [phone]   Ellyn Hack 01/26/2024, 1:34 PM

## 2024-01-26 NOTE — Progress Notes (Signed)
 Received referral for this patient to be seen for new diagnosis of GE Junction cancer. He is currently admitted for subdural hematoma that occurred after a possible fall. He has required a craniotomy with hematoma evacuation yesterday. Provided Dr Maria Shiner inpatient details in case he is able to see patient before his discharge.  Patient continues to be admitted. New patient appointment scheduled, and information sent to patient via MyChart. Will call patient to confirm appointment details once he is discharged.   Oncology Nurse Navigator Documentation     01/26/2024   10:30 AM  Oncology Nurse Navigator Flowsheets  Abnormal Finding Date 01/04/2024  Confirmed Diagnosis Date 01/20/2024  Navigator Follow Up Date: 02/02/2024  Navigator Follow Up Reason: New Patient Appointment  Navigator Location [facility]  Referral Date to RadOnc/MedOnc 01/25/2024  Navigator Encounter Type MyChart  Patient Visit Type MedOnc  Treatment Phase Pre-Tx/Tx Discussion  Barriers/Navigation Needs Coordination of Care;Education  Education Other  Interventions Coordination of Care;Education  Acuity Level 2-Minimal Needs (1-2 Barriers Identified)  Coordination of Care Appts  Education Method Written  Time Spent with Patient 30

## 2024-01-27 ENCOUNTER — Inpatient Hospital Stay (HOSPITAL_COMMUNITY)

## 2024-01-27 DIAGNOSIS — E785 Hyperlipidemia, unspecified: Secondary | ICD-10-CM | POA: Diagnosis not present

## 2024-01-27 DIAGNOSIS — S065XAA Traumatic subdural hemorrhage with loss of consciousness status unknown, initial encounter: Secondary | ICD-10-CM | POA: Diagnosis not present

## 2024-01-27 DIAGNOSIS — I10 Essential (primary) hypertension: Secondary | ICD-10-CM | POA: Diagnosis not present

## 2024-01-27 DIAGNOSIS — D509 Iron deficiency anemia, unspecified: Secondary | ICD-10-CM | POA: Diagnosis not present

## 2024-01-27 LAB — SURGICAL PATHOLOGY

## 2024-01-27 LAB — GLUCOSE, CAPILLARY
Glucose-Capillary: 106 mg/dL — ABNORMAL HIGH (ref 70–99)
Glucose-Capillary: 112 mg/dL — ABNORMAL HIGH (ref 70–99)
Glucose-Capillary: 118 mg/dL — ABNORMAL HIGH (ref 70–99)
Glucose-Capillary: 126 mg/dL — ABNORMAL HIGH (ref 70–99)
Glucose-Capillary: 127 mg/dL — ABNORMAL HIGH (ref 70–99)
Glucose-Capillary: 134 mg/dL — ABNORMAL HIGH (ref 70–99)

## 2024-01-27 MED ORDER — ENOXAPARIN SODIUM 40 MG/0.4ML IJ SOSY
40.0000 mg | PREFILLED_SYRINGE | INTRAMUSCULAR | Status: DC
  Administered 2024-01-27 – 2024-01-28 (×2): 40 mg via SUBCUTANEOUS
  Filled 2024-01-27 (×2): qty 0.4

## 2024-01-27 MED ORDER — GADOBUTROL 1 MMOL/ML IV SOLN
7.0000 mL | Freq: Once | INTRAVENOUS | Status: AC | PRN
  Administered 2024-01-27: 7 mL via INTRAVENOUS

## 2024-01-27 MED ORDER — HALOPERIDOL LACTATE 5 MG/ML IJ SOLN
2.0000 mg | Freq: Four times a day (QID) | INTRAMUSCULAR | Status: DC | PRN
  Administered 2024-01-28: 2 mg via INTRAVENOUS
  Filled 2024-01-27 (×2): qty 1

## 2024-01-27 NOTE — Progress Notes (Signed)
    Providing Compassionate, Quality Care - Together   NEUROSURGERY PROGRESS NOTE     S: No issues overnight.    O: EXAM:  BP 133/66   Pulse 71   Temp 98.3 F (36.8 C) (Oral)   Resp 16   Wt 69.1 kg   SpO2 96%   BMI 23.86 kg/m     Awake, alert, oriented  Speech fluent, appropriate  CNs grossly intact  MAEs, strength grossly intact SILTx4 Dressing c/d/I  Cranial drain removed at bedside w/o acute complication.    ASSESSMENT:  68 y.o. s/p R crani for SDH    PLAN: -Ok for stepdown -MRI brain w/ and w/o given new dx of gastric malignancy -Supportive care -Ok for Lovenox  dvt ppx today, continue to hold ASA.  -Call w/ questions/concerns.   Matthew Lee, [facility]

## 2024-01-27 NOTE — Anesthesia Postprocedure Evaluation (Signed)
 Anesthesia Post Note  Patient: Matthew Lee  Procedure(s) Performed: CRANIOTOMY HEMATOMA EVACUATION SUBDURAL (Right: Head)     Patient location during evaluation: PACU Anesthesia Type: General Level of consciousness: awake and alert Pain management: pain level controlled Vital Signs Assessment: post-procedure vital signs reviewed and stable Respiratory status: spontaneous breathing, nonlabored ventilation, respiratory function stable and patient connected to nasal cannula oxygen Cardiovascular status: blood pressure returned to baseline and stable Postop Assessment: no apparent nausea or vomiting Anesthetic complications: no   No notable events documented.  Last Vitals:  Vitals:   01/27/24 0800 01/27/24 0900  BP: 136/68 133/66  Pulse: 77 71  Resp: 14 16  Temp:    SpO2: 94% 96%    Last Pain:  Vitals:   01/27/24 0838  TempSrc:   PainSc: 2                  Nivaan Dicenzo S

## 2024-01-27 NOTE — Progress Notes (Signed)
 NAME:  Matthew Lee, MRN:  [id number], DOB:  03-20-1956, LOS: 2 ADMISSION DATE:  01/25/2024, CONSULTATION DATE:  01/25/2024 REFERRING MD:  Gordon Latus - EDP, CHIEF COMPLAINT: Dizziness    History of Present Illness:  68 year old man who presented to [facility] 4/29 as a transfer from [facility] for unilateral L-sided weakness and gait disturbance. PMHx significant for HTN, HLD, CAD, HFpEF, thoracic aortic aneurysm, chronic HCV s/p treatment, IDA.  Presented to [facility] ER with complaints of feeling off balance when walking, mild left arm and leg weakness unable to lift left foot, and difficulty typing since waking up 4/25.  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 No known falls, however patient does not remember much after procedure.  Not on any anticoagulants. Denies changes in vision, speech, confusion, or headaches.     In ER, afebrile, hemodynamically stable.  CMET unremarkable except bicarb 21, glucose 111, normal coags, CBC at baseline, H/H 11.2/ 35.3.  CT Head showed a large and mostly isodense right SDH (up to 23mm thickness) likely septated with leftward midline shift of 9mm.  NSGY was consulted and will see on admit with recommendations for transfer to Neuro ICU, MRI brain, and empiric keppra . PCCM to admit for medical management while in ICU.   Pertinent Medical History:   Past Medical History:  Diagnosis Date   Abnormal weight loss 11/02/2022   Aneurysm of thoracic aorta (HCC) 09/2022   dilated to 4 cm on CT   Cholelithiasis 11/02/2022   Colon cancer screening 07/31/2022   Coronary artery calcification 10/08/2022   Diverticulitis    Dyspnea on exertion 11/11/2022   Elevated PSA 07/31/2022   Essential hypertension 09/26/2009   Qualifier: Diagnosis of   By: Luster Salters MD, Sheilda Deputy 11/11/2022   Gall bladder polyp 02/18/2015   Overview:   - 6 mm (as described from US  abd in 2015). Repeat Us  abd lim  ordered.   GERD (gastroesophageal reflux disease) 09/07/2013   Hepatitis C 08/24/2012   TX and cured per pt   History of COVID-19 07/31/2022   History of hepatitis C 11/11/2022   History of tobacco abuse    Hyperglycemia 11/22/2014   Hypertension    long standing since 2007, was treated for sometime, but then discontinued taking meds on his own.   Immunization counseling 04/01/2022   Insomnia 10/31/2009   Qualifier: Diagnosis of   By: Evlyn Hoffmann MD, Amanjot       Mild diastolic dysfunction 08/22/2012   Mixed dyslipidemia 11/11/2022   Preventative health care 12/31/2014   Prominent abdominal aortic pulsation 11/11/2022   Pulmonary edema, acute (HCC)    hospitalized in 09/2009 with hypertensive crisis and acute pulmonary edema- improved with lasix  nad labetalol .   Significant Hospital Events: Including procedures, antibiotic start and stop dates in addition to other pertinent events   4/29 Admitted to [facility] w/ SDH 4/30 dx gastric/GE junction adenocarcinoma  5/1   Interim History / Subjective:  Decr drain output overnight   Objective:  Blood pressure 133/66, pulse 71, temperature 98.3 F (36.8 C), temperature source Oral, resp. rate 16, weight 69.1 kg, SpO2 96%.    FiO2 (%):  [28 %] 28 %   Intake/Output Summary (Last 24 hours) at 01/27/2024 0959 Last data filed at 01/27/2024 0800 Gross per 24 hour  Intake 120.66 ml  Output 1175 ml  Net -1054.34 ml   Filed Weights   01/25/24  1148 01/26/24 0500 01/27/24 0500  Weight: 70.3 kg 68.9 kg 69.1 kg   Physical Examination: General: Acutely ill older adult M NAD  HEENT: [place]. R crani site is dressed. Drain with bloody output  Neuro: AAOx4. Flat affect CV: rr s1s2 cap refill brisk  PULM: even unlabored on RA  GI: soft  Extremities: no acute joint deformity  Skin: c/d/w   Resolved Hospital Problem List:     Assessment & Plan:    R SDH w brain compression, s/p crani hematoma evac 4/29  -Given recent findings of malignancy, will need  to be ruled out -post op CT 4/30 w improving R-L midline shift (5.39mm from 8.48mm)  P -NSGY following, drain duration/mgmnt per NSGY  -- anticipate after drain is dc pt can likely txf out of ICU  - will ask NSGY re timing of resuming home ASA and starting chemical vte ppx  -PT/OT, PRN analgesia  -BID Keppra , sz precautions   - SBP goal < 160  HTN HFpEF CAD HLD P -losartan , labetalol ,hydrochlorothiazide , amplodipine + PRN labetalol   -Statin  -holding ASA for now   Fe Def anemia, stable  P -cont Fe  -can check CBC PRN or every other day, but doesn't need daily  Gastric adenocarcinoma at proximal gastric/GE junction  -concern for regional lymph node involvement  as well as R axillary lymph node adenopathy  P - GI has requested that they be reached out to again prior to dc. Follows w Dr. Bridgett Camps.  Possible EUS when appropriate medically   -consider R axillary node perc bx while inpt  -has outpt onc referral, appt 02/02/24  GERD P -PPI  Sleep disturbance -PRN ambien    Best Practice (right click and "Reselect all SmartList Selections" daily)   Diet/type: Regular consistency (see orders) DVT prophylaxis SCD Pressure ulcer(s): N/A GI prophylaxis: PPI Lines: N/A Foley:  N/A Code Status:  full code Last date of multidisciplinary goals of care discussion [pt wife updated 5/1 at bedside  Critical care time: N/A   Mod MDM   Eston Hence MSN, AGACNP-BC [facility] Pulmonary/Critical Care Medicine Amion for pager  01/27/2024, 9:59 AM

## 2024-01-27 NOTE — Progress Notes (Signed)
 eLink Physician-Brief Progress Note Patient Name: Matthew Lee DOB: 1956-03-14 MRN: [id number]   Date of Service  01/27/2024  HPI/Events of Note  Right SDH with brain compression and craniotomy for hematoma evacuation 4/29   called to report pt attempting to get OOB, seems confused, unclear whether he's sundowning  eICU Interventions  Attempt low dose haldol       Intervention Category Minor Interventions: Agitation / anxiety - evaluation and management  Daylyn Christine 01/27/2024, 11:50 PM

## 2024-01-28 ENCOUNTER — Inpatient Hospital Stay (HOSPITAL_COMMUNITY)

## 2024-01-28 ENCOUNTER — Encounter: Payer: Self-pay | Admitting: *Deleted

## 2024-01-28 DIAGNOSIS — E876 Hypokalemia: Secondary | ICD-10-CM | POA: Diagnosis not present

## 2024-01-28 DIAGNOSIS — I10 Essential (primary) hypertension: Secondary | ICD-10-CM | POA: Diagnosis not present

## 2024-01-28 DIAGNOSIS — C169 Malignant neoplasm of stomach, unspecified: Secondary | ICD-10-CM | POA: Diagnosis not present

## 2024-01-28 DIAGNOSIS — E871 Hypo-osmolality and hyponatremia: Secondary | ICD-10-CM

## 2024-01-28 DIAGNOSIS — S065XAA Traumatic subdural hemorrhage with loss of consciousness status unknown, initial encounter: Secondary | ICD-10-CM | POA: Diagnosis not present

## 2024-01-28 HISTORY — PX: IR US LIVER BIOPSY: IMG936

## 2024-01-28 LAB — CBC
HCT: 33.3 % — ABNORMAL LOW (ref 39.0–52.0)
Hemoglobin: 10.9 g/dL — ABNORMAL LOW (ref 13.0–17.0)
MCH: 25.3 pg — ABNORMAL LOW (ref 26.0–34.0)
MCHC: 32.7 g/dL (ref 30.0–36.0)
MCV: 77.4 fL — ABNORMAL LOW (ref 80.0–100.0)
Platelets: 328 10*3/uL (ref 150–400)
RBC: 4.3 MIL/uL (ref 4.22–5.81)
RDW: 14.1 % (ref 11.5–15.5)
WBC: 10.1 10*3/uL (ref 4.0–10.5)
nRBC: 0 % (ref 0.0–0.2)

## 2024-01-28 LAB — BASIC METABOLIC PANEL WITH GFR
Anion gap: 10 (ref 5–15)
BUN: 11 mg/dL (ref 8–23)
CO2: 25 mmol/L (ref 22–32)
Calcium: 8.9 mg/dL (ref 8.9–10.3)
Chloride: 98 mmol/L (ref 98–111)
Creatinine, Ser: 0.81 mg/dL (ref 0.61–1.24)
GFR, Estimated: >60 mL/min (ref >60)
Glucose, Bld: 97 mg/dL (ref 70–99)
Potassium: 3.3 mmol/L — ABNORMAL LOW (ref 3.5–5.1)
Sodium: 133 mmol/L — ABNORMAL LOW (ref 135–145)

## 2024-01-28 LAB — GLUCOSE, CAPILLARY
Glucose-Capillary: 114 mg/dL — ABNORMAL HIGH (ref 70–99)
Glucose-Capillary: 150 mg/dL — ABNORMAL HIGH (ref 70–99)
Glucose-Capillary: 100 mg/dL — ABNORMAL HIGH (ref 70–99)
Glucose-Capillary: 110 mg/dL — ABNORMAL HIGH (ref 70–99)

## 2024-01-28 MED ORDER — LIDOCAINE HCL 1 % IJ SOLN
INTRAMUSCULAR | Status: AC
  Filled 2024-01-28: qty 20

## 2024-01-28 MED ORDER — POTASSIUM CHLORIDE CRYS ER 20 MEQ PO TBCR
40.0000 meq | EXTENDED_RELEASE_TABLET | Freq: Once | ORAL | Status: AC
  Administered 2024-01-28: 40 meq via ORAL
  Filled 2024-01-28: qty 2

## 2024-01-28 MED ORDER — LIDOCAINE HCL 1 % IJ SOLN
20.0000 mL | Freq: Once | INTRAMUSCULAR | Status: DC
  Filled 2024-01-28: qty 20

## 2024-01-28 NOTE — Plan of Care (Signed)
  Problem: Activity: Goal: Risk for activity intolerance will decrease Outcome: Progressing   Problem: Pain Managment: Goal: General experience of comfort will improve and/or be controlled Outcome: Progressing   Problem: Skin Integrity: Goal: Risk for impaired skin integrity will decrease Outcome: Progressing

## 2024-01-28 NOTE — TOC Progression Note (Signed)
 Transition of Care [facility]) - Progression Note    Patient Details  Name: Matthew Lee MRN: [id number] Date of Birth: Apr 26, 1956  [facility] of [facility] CM/SW Contact  Jonathan Neighbor, RN Phone Number: 01/28/2024, 3:02 PM  Clinical Narrative:     Plan: GI wants him to get axillary LN biopsy by IR   No follow up per therapies.  TOC following.  Expected Discharge Plan: Home/Self Care Barriers to Discharge: Continued Medical Work up  Expected Discharge Plan and Services   Discharge Planning Services: CM Consult   Living arrangements for the past 2 months: Single Family Home                 DME Arranged: N/A                     Social Determinants of Health (SDOH) Interventions SDOH Screenings   Food Insecurity: No Food Insecurity (01/25/2024)  Housing: Low Risk  (01/25/2024)  Transportation Needs: No Transportation Needs (01/25/2024)  Utilities: Not At Risk (01/25/2024)  Alcohol Screen: Low Risk  (05/29/2021)  Depression (PHQ2-9): Low Risk  (12/15/2023)  Financial Resource Strain: Low Risk  (12/15/2023)  Physical Activity: Sufficiently Active (12/15/2023)  Social Connections: Socially Isolated (01/25/2024)  Stress: Stress Concern Present (12/15/2023)  Tobacco Use: Medium Risk (01/25/2024)    Readmission Risk Interventions     No data to display

## 2024-01-28 NOTE — Progress Notes (Signed)
 Received call from PCCU MD relayed observation and orders given.

## 2024-01-28 NOTE — Progress Notes (Signed)
 [facility]                                                                               Matthew Lee, is a 68 y.o. male, DOB - 02/23/1956, WUJ:[id number] Admit date - 01/25/2024    Outpatient Primary MD for the patient is Dorrene Gaucher, NP  LOS - 3  days    Brief summary   Matthew Lee is a 68 year old male with history of hepatitis C, iron deficiency anemia, HTN, HLD, presented to ED for chief complaining of feeling off balance when walking not be able to pick up his left foot while ambulating.  Difficulty typing for past few days.  Weakness especially in the left side, with no speech difficulties. Denies of having any numbness. Symptoms started post EGD/colonoscopy 5 days ago for GE junction carcinoma   ED:Per EDP evaluation: Blood pressure 117/66, pulse 83, temperature 98.6 F (37 C), resp. rate 18, weight 70.3 kg, SpO2 99% on RA.  Per EDP patient is coherent awake alert oriented, left-sided weakness unsteady gait was noted.  CBC CMP reviewed CO2 21, glucose 111, hemoglobin 11.2,    CT head IMPRESSION: 1. Large and mostly isodense Right Subdural Hematoma, estimated up to 23 mm thickness, and probably septated. 2. Intracranial mass effect with leftward midline shift of 9 mm. 3. No skull fracture or other acute intracranial abnormalities    EDP: has called Neurosurgery; Easter Golden PA-C,  discussed lab and imaging findings as well as pertinent plan - they recommend: start on Keppra  500 mg BID. Get MRI brain without contrast and admit to medicine.    Discussed the case with Dr. Gordon Latus -who is seeing the patient in concert with his PA-C Sonnie Dusky.   Based on this presentation we recommend patient to be admitted to neuro ICU under intensive care observation. Close follow-up with neurosurgery for possible intervention   Assessment & Plan    Assessment and Plan: No notes have been filed under this hospital service. Service: Hospitalist         RN Pressure Injury Documentation:    Malnutrition Type:      Malnutrition Characteristics:      Nutrition Interventions:     Estimated body mass index is 23.86 kg/m as calculated from the following:   Height as of 01/20/24: 5\' 7"  (1.702 m).   Weight as of this encounter: 69.1 kg.  Code Status: *** DVT Prophylaxis:  enoxaparin  (LOVENOX ) injection 40 mg Start: 01/27/24 2000 SCDs Start: 01/26/24 0045   Level of Care: Level of care: Progressive Family Communication: Updated patient's ***  Disposition Plan:     Remains inpatient appropriate:  ***   Procedures:  ***  Consultants:   ***  Antimicrobials:   Anti-infectives (From admission, onward)    Start     Dose/Rate Route Frequency Ordered Stop   01/25/24 2100  ceFAZolin  (ANCEF ) IVPB 1 g/50 mL premix        1 g 100 mL/hr over 30 Minutes Intravenous Every 8 hours 01/25/24 2058 01/26/24 0443   01/25/24 1818  ceFAZolin  (ANCEF ) 2-4 GM/100ML-% IVPB       Note to Pharmacy: Merna Aase: cabinet override  01/25/24 1818 01/25/24 2108        Medications  Scheduled Meds:  amLODipine   10 mg Oral Daily   atorvastatin   10 mg Oral Daily   Chlorhexidine  Gluconate Cloth  6 each Topical Daily   docusate sodium   100 mg Oral BID   enoxaparin  (LOVENOX ) injection  40 mg Subcutaneous Q24H   ferrous sulfate   325 mg Oral QODAY   hydrochlorothiazide   25 mg Oral Daily   labetalol   150 mg Oral BID   levETIRAcetam   500 mg Oral BID   losartan   100 mg Oral Daily   pantoprazole   40 mg Oral BID   sodium chloride  flush  3 mL Intravenous Once   Continuous Infusions: PRN Meds:.acetaminophen  **OR** acetaminophen , haloperidol  lactate, HYDROcodone -acetaminophen , labetalol , ondansetron  **OR** ondansetron  (ZOFRAN ) IV, mouth rinse, oxyCODONE -acetaminophen , polyethylene glycol, promethazine , sodium phosphate , zolpidem     Subjective:   Matthew Lee was seen and examined today.  *** Patient denies dizziness, chest pain, shortness of  breath, abdominal pain, N/V/D/C, new weakness, numbess, tingling. No acute events overnight.    Objective:   Vitals:   01/28/24 0354 01/28/24 0357 01/28/24 0750 01/28/24 1119  BP: 113/78 (!) 130/106 127/75 94/62  Pulse:  75 74 79  Resp: 16 18 18 20   Temp: 98 F (36.7 C) 98.7 F (37.1 C) 98.4 F (36.9 C) 99.3 F (37.4 C)  TempSrc: Oral Oral Oral Oral  SpO2: 100% 94% 99% 96%  Weight:       No intake or output data in the 24 hours ending 01/28/24 1223 Filed Weights   01/25/24 1148 01/26/24 0500 01/27/24 0500  Weight: 70.3 kg 68.9 kg 69.1 kg     Exam General: Alert and oriented x 3, NAD Cardiovascular: S1 S2 auscultated, no murmurs, RRR Respiratory: Clear to auscultation bilaterally, no wheezing, rales or rhonchi Gastrointestinal: Soft, nontender, nondistended, + bowel sounds Ext: no pedal edema bilaterally Neuro: AAOx3, Cr N's II- XII. Strength 5/5 upper and lower extremities bilaterally Skin: No rashes Psych: Normal affect and demeanor, alert and oriented x3    Data Reviewed:  I have personally reviewed following labs and imaging studies   CBC Lab Results  Component Value Date   WBC 10.1 01/28/2024   RBC 4.30 01/28/2024   HGB 10.9 (L) 01/28/2024   HCT 33.3 (L) 01/28/2024   MCV 77.4 (L) 01/28/2024   MCH 25.3 (L) 01/28/2024   PLT 328 01/28/2024   MCHC 32.7 01/28/2024   RDW 14.1 01/28/2024   LYMPHSABS 1.5 01/25/2024   MONOABS 1.0 01/25/2024   EOSABS 0.2 01/25/2024   BASOSABS 0.1 01/25/2024     Last metabolic panel Lab Results  Component Value Date   NA 133 (L) 01/28/2024   K 3.3 (L) 01/28/2024   CL 98 01/28/2024   CO2 25 01/28/2024   BUN 11 01/28/2024   CREATININE 0.81 01/28/2024   GLUCOSE 97 01/28/2024   GFRNONAA >60 01/28/2024   GFRAA >89 01/24/2014   CALCIUM  8.9 01/28/2024   PROT 7.2 01/25/2024   ALBUMIN 4.3 01/25/2024   BILITOT 0.2 01/25/2024   ALKPHOS 117 01/25/2024   AST 23 01/25/2024   ALT 16 01/25/2024   ANIONGAP 10 01/28/2024    CBG  (last 3)  Recent Labs    01/27/24 2355 01/28/24 0401 01/28/24 1130  GLUCAP 106* 114* 150*      Coagulation Profile: Recent Labs  Lab 01/25/24 1202  INR 1.0     Radiology Studies: MR BRAIN W WO CONTRAST Result Date: 01/27/2024 CLINICAL DATA:  Initial  evaluation for metastatic disease evaluation, newly diagnosed gastroesophageal mass, status post subdural evacuation. EXAM: MRI HEAD WITHOUT AND WITH CONTRAST TECHNIQUE: Multiplanar, multiecho pulse sequences of the brain and surrounding structures were obtained without and with intravenous contrast. CONTRAST:  7mL GADAVIST  GADOBUTROL  1 MMOL/ML IV SOLN COMPARISON:  CT from 01/26/2024 FINDINGS: Brain: Examination mildly degraded by motion artifact. Postoperative changes from recent right-sided craniotomy for subdural evacuation again seen. Postoperative pneumocephalus overlies the right cerebral convexity. Residual right-sided subdural collection measures up to 2.4 cm at the right frontal convexity. Persistent mass effect on the subjacent right cerebral hemisphere with 7 mm of right-to-left shift, relatively stable when measured in a similar fashion on prior CT. No hydrocephalus or trapping. Basilar cisterns remain patent. An additional smaller left-sided subdural hematoma overlying the anterior left frontal convexity measures up to 4 mm without significant mass effect (series 7, image 23). Underlying age-related atrophy. Patchy T2/FLAIR hyperintensity involving the periventricular deep white matter both cerebral hemispheres, consistent with chronic small vessel ischemic disease, mild to moderate in nature. No evidence for acute or subacute infarct. No areas of chronic cortical infarction. No acute intracranial hemorrhage. Single punctate chronic microhemorrhage noted within the left pons (series 10, image 11). No mass lesion, mass effect, or midline shift. Normal expected smooth postoperative dural enhancement noted overlying the right cerebral  convexity. No evidence for intracranial metastatic disease. Pituitary gland and suprasellar region within normal limits. Vascular: Major intracranial vascular flow voids are maintained. Skull and upper cervical spine: Craniocervical junction within normal limits. Bone marrow signal intensity grossly normal allowing for motion artifact. Post craniotomy changes at the right calvarium and scalp without adverse features. Sinuses/Orbits: Globes orbital soft tissues within normal limits. Paranasal sinuses are largely clear. No significant mastoid effusion. Other: Than prior IMPRESSION: 1. Postoperative changes from recent right-sided craniotomy for subdural evacuation. Residual right-sided subdural collection measures up to 2.4 cm at the right frontal convexity, with persistent mass effect on the subjacent right cerebral hemisphere and 7 mm of right-to-left shift, relatively stable from recent postoperative head CT from 01/26/2024. No hydrocephalus or trapping. 2. Additional smaller 4 mm left-sided subdural hematoma overlying the anterior left frontal convexity without significant mass effect. 3. No evidence for intracranial metastatic disease. 4. Underlying age-related atrophy with mild to moderate chronic microvascular ischemic disease. Electronically Signed   By: Virgia Griffins M.D.   On: 01/27/2024 20:41       Feliciana Horn M.D. [facility] 01/28/2024, 12:23 PM  Available via Epic secure chat 7am-7pm After 7 pm, please refer to night coverage provider listed on amion.

## 2024-01-28 NOTE — Progress Notes (Signed)
 Patient appears to be compulsive and confused. Called Elink to refer to MD.

## 2024-01-28 NOTE — Care Management Important Message (Signed)
 Important Message  Patient Details  Name: Matthew Lee MRN: [id number] Date of Birth: 1956/05/10   Important Message Given:  Yes - Medicare IM     Felix Host 01/28/2024, 12:12 PM

## 2024-01-28 NOTE — Consult Note (Signed)
 Chief Complaint: Gastric Cancer  Referring Provider(s): Edson Graces  Supervising Physician: Art Largo  Patient Status: [facility] - In-pt  History of Present Illness: Matthew Lee is a 68 y.o. male with new diagnosis of proximal gastric/GE junction adenocarcinoma.  Imaging concerning for regional lymph node involvement.  He has a right axillary node is amenable for percutaneous biopsy/FNA to evidence of metastatic disease.    Patient is Full Code  Past Medical History:  Diagnosis Date   Abnormal weight loss 11/02/2022   Aneurysm of thoracic aorta (HCC) 09/2022   dilated to 4 cm on CT   Cholelithiasis 11/02/2022   Colon cancer screening 07/31/2022   Coronary artery calcification 10/08/2022   Diverticulitis    Dyspnea on exertion 11/11/2022   Elevated PSA 07/31/2022   Essential hypertension 09/26/2009   Qualifier: Diagnosis of   By: Luster Salters MD, Sheilda Deputy 11/11/2022   Gall bladder polyp 02/18/2015   Overview:   - 6 mm (as described from US  abd in 2015). Repeat Us  abd lim ordered.   GERD (gastroesophageal reflux disease) 09/07/2013   Hepatitis C 08/24/2012   TX and cured per pt   History of COVID-19 07/31/2022   History of hepatitis C 11/11/2022   History of tobacco abuse    Hyperglycemia 11/22/2014   Hypertension    long standing since 2007, was treated for sometime, but then discontinued taking meds on his own.   Immunization counseling 04/01/2022   Insomnia 10/31/2009   Qualifier: Diagnosis of   By: Evlyn Hoffmann MD, Amanjot       Mild diastolic dysfunction 08/22/2012   Mixed dyslipidemia 11/11/2022   Preventative health care 12/31/2014   Prominent abdominal aortic pulsation 11/11/2022   Pulmonary edema, acute (HCC)    hospitalized in 09/2009 with hypertensive crisis and acute pulmonary edema- improved with lasix  nad labetalol .    Past Surgical History:  Procedure Laterality Date   CRANIOTOMY Right 01/25/2024   Procedure: CRANIOTOMY HEMATOMA EVACUATION  SUBDURAL;  Surgeon: Van Gelinas, MD;  Location: [facility] OR;  Service: Neurosurgery;  Laterality: Right;   ESOPHAGOGASTRODUODENOSCOPY  08/21/2011   Procedure: ESOPHAGOGASTRODUODENOSCOPY (EGD);  Surgeon: Tami Falcon, MD;  Location: [facility] ENDOSCOPY;  Service: Endoscopy;  Laterality: N/A;   TONSILLECTOMY      Allergies: Patient has no known allergies.  Medications: Prior to Admission medications   Medication Sig Start Date End Date Taking? Authorizing Provider  amLODipine  (NORVASC ) 10 MG tablet TAKE 1 TABLET DAILY 12/15/23  Yes O'Sullivan, Melissa, NP  aspirin EC 325 MG tablet Take 325 mg by mouth daily.   Yes [provider]  atorvastatin  (LIPITOR) 10 MG tablet Take 1 tablet (10 mg total) by mouth daily. 12/23/23 03/22/24 Yes Revankar, Micael Adas, MD  ferrous sulfate  325 (65 FE) MG tablet Take 1 tablet (325 mg total) by mouth every other day. 12/18/23  Yes O'Sullivan, Melissa, NP  hydrochlorothiazide  (HYDRODIURIL ) 25 MG tablet Take 1 tablet (25 mg total) by mouth daily. 12/15/23  Yes Dorrene Gaucher, NP  labetalol  (NORMODYNE ) 100 MG tablet Take 1.5 tablets (150 mg total) by mouth in the morning and at bedtime. 12/15/23  Yes O'Sullivan, Melissa, NP  losartan  (COZAAR ) 50 MG tablet Take 2 tablets (100 mg total) by mouth daily. 12/15/23  Yes O'Sullivan, Melissa, NP  pantoprazole  (PROTONIX ) 40 MG tablet Take 1 tablet (40 mg total) by mouth 2 (two) times daily. 01/04/24  Yes McMichael, Bayley M, PA-C  zolpidem  (AMBIEN ) 5 MG tablet Take 1  tablet (5 mg total) by mouth at bedtime as needed for sleep. 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   Occupation: Garment/textile technologist: [employer]  Tobacco Use   Smoking status: Former    Current packs/day: 0.00    Average packs/day: 1 pack/day for 40.0 years (40.0 ttl pk-yrs)    Types: Cigarettes    Start date: 09/23/1969    Quit date: 09/23/2009    Years since quitting: 14.3   Smokeless tobacco: Never  Vaping Use   Vaping status: Never Used  Substance and Sexual Activity   Alcohol use: Not Currently   Drug use: No   Sexual activity: Not on file  Other Topics Concern   Not on file  Social History Narrative   Lives at home with wife, daughter and her 2 children.   Caffeine use:  1 daily   Works a Aeronautical engineer (Museum/gallery conservator)   Enjoys Optician, dispensing, Civil engineer, contracting.         Social Drivers of Corporate investment banker Strain: Low Risk  (12/15/2023)   Overall Financial Resource Strain (CARDIA)    Difficulty of Paying Living Expenses: Not hard at all  Food Insecurity: No Food Insecurity (01/25/2024)   Hunger Vital Sign    Worried About Running Out of Food in the Last Year: Never true    Ran Out of Food in the Last Year: Never true  Transportation Needs: No Transportation Needs (01/25/2024)   PRAPARE - Administrator, Civil Service (Medical): No    Lack of Transportation (Non-Medical): No  Physical Activity: Sufficiently Active (12/15/2023)   Exercise Vital Sign    Days of Exercise per Week: 5 days    Minutes of Exercise per Session: 40 min  Stress: Stress Concern Present (12/15/2023)   [employer] of Occupational Health - Occupational Stress Questionnaire    Feeling of Stress : To some extent  Social Connections: Socially Isolated (01/25/2024)   Social Connection and Isolation  Panel [NHANES]    Frequency of Communication with Friends and Family: Twice a week    Frequency of Social Gatherings with Friends and Family: Never    Attends Religious Services: Never    Database administrator or Organizations: No    Attends Engineer, structural: Never    Marital Status: Married     Review of Systems: A 12 point ROS discussed and pertinent positives are indicated in the HPI above.  All other systems are negative.   Vital Signs: BP 94/62 (BP Location: Left Arm)   Pulse 79   Temp 99.3 F (37.4 C) (Oral)   Resp 20   Wt 152 lb 5.4 oz (69.1 kg)   SpO2 96%   BMI  23.86 kg/m   Advance Care Plan: The advanced care place/surrogate decision maker was discussed at the time of visit and the patient did not wish to discuss or was not able to name a surrogate decision maker or provide an advance care plan.  Physical Exam Constitutional:      Appearance: Normal appearance.  HENT:     Head: Normocephalic and atraumatic.  Cardiovascular:     Rate and Rhythm: Normal rate.  Pulmonary:     Effort: Pulmonary effort is normal. No respiratory distress.  Abdominal:     Palpations: Abdomen is soft.     Tenderness: There is no abdominal tenderness.  Skin:    General: Skin is warm and dry.  Neurological:     General: No focal deficit present.     Mental Status: He is alert and oriented to person, place, and time.  Psychiatric:        Mood and Affect: Mood normal.        Behavior: Behavior normal.        Thought Content: Thought content normal.        Judgment: Judgment normal.     Imaging: MR BRAIN W WO CONTRAST Result Date: 01/27/2024 CLINICAL DATA:  Initial evaluation for metastatic disease evaluation, newly diagnosed gastroesophageal mass, status post subdural evacuation. EXAM: MRI HEAD WITHOUT AND WITH CONTRAST TECHNIQUE: Multiplanar, multiecho pulse sequences of the brain and surrounding structures were obtained without and with intravenous contrast. CONTRAST:   7mL GADAVIST  GADOBUTROL  1 MMOL/ML IV SOLN COMPARISON:  CT from 01/26/2024 FINDINGS: Brain: Examination mildly degraded by motion artifact. Postoperative changes from recent right-sided craniotomy for subdural evacuation again seen. Postoperative pneumocephalus overlies the right cerebral convexity. Residual right-sided subdural collection measures up to 2.4 cm at the right frontal convexity. Persistent mass effect on the subjacent right cerebral hemisphere with 7 mm of right-to-left shift, relatively stable when measured in a similar fashion on prior CT. No hydrocephalus or trapping. Basilar cisterns remain patent. An additional smaller left-sided subdural hematoma overlying the anterior left frontal convexity measures up to 4 mm without significant mass effect (series 7, image 23). Underlying age-related atrophy. Patchy T2/FLAIR hyperintensity involving the periventricular deep white matter both cerebral hemispheres, consistent with chronic small vessel ischemic disease, mild to moderate in nature. No evidence for acute or subacute infarct. No areas of chronic cortical infarction. No acute intracranial hemorrhage. Single punctate chronic microhemorrhage noted within the left pons (series 10, image 11). No mass lesion, mass effect, or midline shift. Normal expected smooth postoperative dural enhancement noted overlying the right cerebral convexity. No evidence for intracranial metastatic disease. Pituitary gland and suprasellar region within normal limits. Vascular: Major intracranial vascular flow voids are maintained. Skull and upper cervical spine: Craniocervical junction within normal limits. Bone marrow signal intensity grossly normal allowing for motion artifact. Post craniotomy changes at the right calvarium and scalp without adverse features. Sinuses/Orbits: Globes orbital soft tissues within normal limits. Paranasal sinuses are largely clear. No significant mastoid effusion. Other: Than prior IMPRESSION: 1.  Postoperative changes from recent right-sided craniotomy for subdural evacuation. Residual right-sided subdural collection measures up to 2.4 cm at the right frontal convexity, with persistent mass effect on the subjacent right cerebral hemisphere and 7 mm of right-to-left shift, relatively stable from recent postoperative head CT from 01/26/2024. No hydrocephalus or trapping. 2. Additional smaller 4 mm left-sided subdural hematoma overlying the anterior left frontal convexity without significant mass effect. 3. No evidence for intracranial metastatic disease. 4. Underlying age-related atrophy with  mild to moderate chronic microvascular ischemic disease. Electronically Signed   By: Virgia Griffins M.D.   On: 01/27/2024 20:41   CT HEAD WO CONTRAST Result Date: 01/26/2024 CLINICAL DATA:  Follow-up craniotomy EXAM: CT HEAD WITHOUT CONTRAST TECHNIQUE: Contiguous axial images were obtained from the base of the skull through the vertex without intravenous contrast. RADIATION DOSE REDUCTION: This exam was performed according to the departmental dose-optimization program which includes automated exposure control, adjustment of the mA and/or kV according to patient size and/or use of iterative reconstruction technique. COMPARISON:  01/25/2024 FINDINGS: Brain: Interval right frontal craniotomy for evacuation of large right-sided subdural hematoma. Subdural drain remains in place, directed inferiorly. There is reduction in the amount of pre-existing subdural blood material on the right with less mass effect, right-to-left shift of 5.4 mm presently compared with 8.8 mm on the prior study. Subdural air is present particularly in the right frontal region. Small amount of new hyperdense subdural blood related to the surgical evacuation. No evidence of parenchymal bleeding. No ischemic infarction. No subarachnoid blood. No hydrocephalus. Vascular: There is atherosclerotic calcification of the major vessels at the base of  the brain. Skull: Otherwise negative Sinuses/Orbits: Clear/normal Other: None IMPRESSION: Interval right frontal craniotomy for evacuation of large right-sided subdural hematoma. Subdural drain remains in place. Reduction in the amount of pre-existing subdural blood material on the right with less mass effect, right-to-left shift of 5.4 mm presently compared with 8.8 mm on the prior study. Small amount of new hyperdense subdural blood related to the surgical evacuation. Electronically Signed   By: Bettylou Brunner M.D.   On: 01/26/2024 08:31   CT HEAD WO CONTRAST Addendum Date: 01/25/2024 ADDENDUM REPORT: 01/25/2024 13:14 ADDENDUM: Study discussed by telephone with Dr. Jerald Molly on 01/25/2024 at 1302 hours. Electronically Signed   By: Marlise Simpers M.D.   On: 01/25/2024 13:14   Result Date: 01/25/2024 CLINICAL DATA:  68 year old male with altered mental status, dizziness, decreased balance, neurologic deficit, syncope. EXAM: CT HEAD WITHOUT CONTRAST TECHNIQUE: Contiguous axial images were obtained from the base of the skull through the vertex without intravenous contrast. RADIATION DOSE REDUCTION: This exam was performed according to the departmental dose-optimization program which includes automated exposure control, adjustment of the mA and/or kV according to patient size and/or use of iterative reconstruction technique. COMPARISON:  None Available. FINDINGS: Brain: Large and mostly isodense extra-axial hemorrhage in the right hemisphere with thickness estimated up to 23 mm on coronal images (series 5, image 43). Probable internal septations (series 2, image 23. Relatively small volume of hyperdense blood products within the collection (series 2, image 15). Intracranial mass effect with leftward midline shift of 9 mm. Partially effaced suprasellar cistern. Other basilar cisterns maintained. Compressed right lateral ventricle. No ventriculomegaly. No discrete cytotoxic or cerebral edema is identified. Vascular:  Calcified atherosclerosis at the skull base. No suspicious intracranial vascular hyperdensity. Skull: Intact, no fracture identified. Sinuses/Orbits: Visualized paranasal sinuses and mastoids are clear. Other: No acute orbit or scalp soft tissue finding. IMPRESSION: 1. Large and mostly isodense Right Subdural Hematoma, estimated up to 23 mm thickness, and probably septated. 2. Intracranial mass effect with leftward midline shift of 9 mm. 3. No skull fracture or other acute intracranial abnormality identified. Electronically Signed: By: Marlise Simpers M.D. On: 01/25/2024 12:55   CT CHEST ABDOMEN PELVIS W CONTRAST Result Date: 01/21/2024 CLINICAL DATA:  Gastric tumor at the GE junction. Abnormal EGD. * Tracking Code: BO * EXAM: CT CHEST, ABDOMEN, AND PELVIS WITH CONTRAST TECHNIQUE: Multidetector  CT imaging of the chest, abdomen and pelvis was performed following the standard protocol during bolus administration of intravenous contrast. RADIATION DOSE REDUCTION: This exam was performed according to the departmental dose-optimization program which includes automated exposure control, adjustment of the mA and/or kV according to patient size and/or use of iterative reconstruction technique. CONTRAST:  OMNIPAQUE  IOHEXOL  300 MG/ML  SOLN COMPARISON:  Lung cancer screening CT 10/07/2022. Abdomen pelvis CT 01/21/2018 and older. FINDINGS: CT CHEST FINDINGS Cardiovascular: Heart is nonenlarged. No pericardial effusion. Coronary artery calcifications are seen. The thoracic aorta is normal course and caliber with partially calcified scattered atherosclerotic plaque. Plaque also seen along the great vessels. There is some enlargement of the pulmonary arteries. Please correlate for pulmonary artery hypertension. Mediastinum/Nodes: Preserved thyroid  gland. There is an isolated enlarged right axillary node measuring 19 x 11 mm. Going back to study of 2024 this node measured 15 by 9 mm, slightly larger today. No left axillary node  enlargement. No hilar or mediastinal nodal enlargement at this time. Calcified left hilar nodes identified consistent with old granulomatous disease. Lungs/Pleura: There is some linear opacity lung bases likely scar or atelectasis. Breathing motion. No consolidation, pneumothorax or effusion. There is a punctate calcified nodule in the lingula on series 302, image 63 consistent with old granulomatous disease and is stable. Larger calcified left lower lobe nodule image 58. There is a stable noncalcified nodule left lower lobe series 302, image 84 measuring 3 mm. No new dominant lung nodule. Musculoskeletal: Mild curvature of the spine with some degenerative changes. CT ABDOMEN PELVIS FINDINGS Hepatobiliary: No focal liver abnormality is seen. No gallstones, gallbladder wall thickening, or biliary dilatation. Patent portal vein. Pancreas: Unremarkable. No pancreatic ductal dilatation or surrounding inflammatory changes. Spleen: Normal in size without focal abnormality.  Small splenule Adrenals/Urinary Tract: Diffuse thickening of the adrenal glands is again seen. Right-sided stable. Left appears larger. Hounsfield units of this area on portal venous phase age of 71 and delay 59 consistent with relative washout of an adenoma. Bosniak 1 upper pole right-sided small renal cyst. Few other tiny bilateral sub 5 mm foci are identified, too small to completely characterize although statistically likely benign cystic lesions and no specific imaging follow-up. No enhancing renal mass or collecting system dilatation. Ureters have normal course and caliber extending down to the urinary bladder. Bladder is underdistended. Stomach/Bowel: Large bowel has a normal course and caliber. Scattered colonic stool. Left-sided colonic diverticulosis diffusely. Normal appendix in the right lower quadrant extending medial to the cecum and posterior. Of note there is transverse colon extending between the liver margin in the anterior abdominal  wall. Small bowel is nondilated. There is nodular wall thickening along the stomach involving the fundus, cardia and midbody with irregular margins. There is some stranding and vascular engorgement. Please correlate for the location of the patient's known gastric neoplasm. Extension up to the level of the diaphragm and GE junction. Vascular/Lymphatic: Extensive vascular calcifications along the aorta and branch vessels. Normal caliber IVC. There are several small but prominent lymph nodes identified. These include gastric Paddock ligament on series 301, image 52 measuring 18 by 10 mm. Node on image 56 measuring 13 by 7 mm. Numerous other nodes extending in this location in to the celiac margin. Reproductive: Prostate is unremarkable. Other: No free air or free fluid. Musculoskeletal: Curvature of the spine with some degenerative changes. Trace anterolisthesis of L4 on L5. Multilevel disc bulging with some encroachment. IMPRESSION: Nodular wall thickening over a long segment of the upper  stomach extending from the GE junction to the midbody. Margins are somewhat irregular. Adjacent prominent lymph nodes identified in the gastrohepatic ligament, lesser curve towards the celiac margin. Regional spread of disease is possible. No specific abnormal nodes along the lower thorax. There is 1 prominent right axillary node, slightly larger than the study of 2024 of uncertain etiology and significance. Please correlate for any known history. Further workup with ultrasound or attention on follow-up for the patient's neoplasm in 3 months. Evidence of old granulomatous disease. Tiny new stable 3 mm noncalcified left lower lobe lung nodule. This has been stable for 1 year. Enlargement of the pulmonary arteries. Please correlate for pulmonary artery hypertension. Fatty liver infiltration.  Colonic diverticulosis. Electronically Signed   By: Adrianna Horde M.D.   On: 01/21/2024 14:38    Labs:  CBC: Recent Labs    01/04/24 1018  01/25/24 1202 01/26/24 0536 01/28/24 0712  WBC 7.6 8.7 11.6* 10.1  HGB 11.4* 11.2* 9.7* 10.9*  HCT 34.9* 35.3* 30.5* 33.3*  PLT 281.0 354 295 328    COAGS: Recent Labs    01/25/24 1202  INR 1.0  APTT 28    BMP: Recent Labs    01/04/24 1018 01/25/24 1202 01/26/24 0536 01/28/24 0712  NA 137 135 130* 133*  K 4.1 4.0 3.8 3.3*  CL 102 100 101 98  CO2 25 21* 21* 25  GLUCOSE 105* 111* 126* 97  BUN 17 17 10 11   CALCIUM  9.2 9.4 8.3* 8.9  CREATININE 0.89 0.97 0.79 0.81  GFRNONAA  --  >60 >60 >60    LIVER FUNCTION TESTS: Recent Labs    02/24/23 0754 01/04/24 1018 01/25/24 1202  BILITOT 0.8 0.6 0.2  AST 13 20 23   ALT 13 16 16   ALKPHOS 77 94 117  PROT 7.1 6.9 7.2  ALBUMIN 4.1 4.2 4.3    TUMOR MARKERS: No results for input(s): "AFPTM", "CEA", "CA199", "CHROMGRNA" in the last 8760 hours.  Assessment and Plan:  Gastric cancer with concern for metastatic disease.  Will proceed with image guided biopsy of the right axillary node today by Dr. Darylene Epley.  Risks and benefits of axillary biopsy was discussed with the patient and/or patient's family including, but not limited to bleeding, infection, damage to adjacent structures or low yield requiring additional tests.  All of the questions were answered and there is agreement to proceed.  Consent signed and in chart.    Electronically Signed: Connor Deiters, PA-C   01/28/2024, 1:08 PM      I spent a total of 20 Minutes  in face to face in clinical consultation, greater than 50% of which was counseling/coordinating care for axillary lymph node biopsy.

## 2024-01-28 NOTE — Progress Notes (Signed)
 Patient to be discharged today. Called and spoke to patient about upcoming appointment. I explained appointment date, time and location and also notified him that all the information was also sent to him via MyChart message. He had no additional questions. Encouraged him to call before Wednesday if he had any questions or concerns.   Oncology Nurse Navigator Documentation     01/28/2024   12:15 PM  Oncology Nurse Navigator Flowsheets  Navigator Follow Up Date: 02/02/2024  Navigator Follow Up Reason: New Patient Appointment  Navigator Location [facility]  Navigator Encounter Type Introductory Phone Call  Patient Visit Type MedOnc  Treatment Phase Pre-Tx/Tx Discussion  Barriers/Navigation Needs Coordination of Care;Education  Education Other  Interventions Education  Acuity Level 2-Minimal Needs (1-2 Barriers Identified)  Education Method Verbal  Time Spent with Patient 15

## 2024-01-28 NOTE — Progress Notes (Signed)
    Providing Compassionate, Quality Care - Together   NEUROSURGERY PROGRESS NOTE     S: No issues overnight.    O: EXAM:  BP 127/75 (BP Location: Left Arm)   Pulse 74   Temp 98.4 F (36.9 C) (Oral)   Resp 18   Wt 69.1 kg   SpO2 99%   BMI 23.86 kg/m     Awake, alert, oriented  Speech fluent, appropriate  CNs grossly intact  MAEs Dressing c/d/i   ASSESSMENT:  68 y.o. s/p R crani for SDH     PLAN: -Appropriate for dc from NSGY standpoint.  -F/u outpt in 2 weeks w/ repeat CTH.  -Continue to hold ASA.  -Call w/ questions/concerns.   Easter Golden, [facility]

## 2024-01-28 NOTE — Procedures (Signed)
 Vascular and Interventional Radiology Procedure Note  Patient: Matthew Lee DOB: 03/14/1956 Medical Record Number: [id number] Note Date/Time: 01/28/24 3:45 PM   Performing Physician: Art Largo, MD Assistant(s): None  Diagnosis: R axilary adenopathy. Hx UGI CA   Procedure: RIGHT AXILLARY LYMPH NODE BIOPSY  Anesthesia: Local Anesthetic Complications: None Estimated Blood Loss: Minimal Specimens: Sent for Pathology  Findings:  Successful Ultrasound-guided biopsy of R axilary adenopathy. A total of 3 samples were obtained. Hemostasis of the tract was achieved using Manual Pressure.  Plan: Bed rest for 0 hours.  See detailed procedure note with images in PACS. The patient tolerated the procedure well without incident or complication and was returned to Recovery in stable condition.    Art Largo, MD [facility] Inter[facility] Radiology [facility] Radiology   Pager. [phone] Clinic. [phone]

## 2024-01-29 DIAGNOSIS — D509 Iron deficiency anemia, unspecified: Secondary | ICD-10-CM | POA: Diagnosis not present

## 2024-01-29 DIAGNOSIS — Z87891 Personal history of nicotine dependence: Secondary | ICD-10-CM | POA: Diagnosis not present

## 2024-01-29 DIAGNOSIS — I10 Essential (primary) hypertension: Secondary | ICD-10-CM | POA: Diagnosis not present

## 2024-01-29 DIAGNOSIS — S065XAA Traumatic subdural hemorrhage with loss of consciousness status unknown, initial encounter: Secondary | ICD-10-CM | POA: Diagnosis not present

## 2024-01-29 LAB — GLUCOSE, CAPILLARY
Glucose-Capillary: 102 mg/dL — ABNORMAL HIGH (ref 70–99)
Glucose-Capillary: 112 mg/dL — ABNORMAL HIGH (ref 70–99)
Glucose-Capillary: 112 mg/dL — ABNORMAL HIGH (ref 70–99)
Glucose-Capillary: 98 mg/dL (ref 70–99)

## 2024-01-29 LAB — BASIC METABOLIC PANEL WITH GFR
Anion gap: 9 (ref 5–15)
BUN: 14 mg/dL (ref 8–23)
CO2: 23 mmol/L (ref 22–32)
Calcium: 9.1 mg/dL (ref 8.9–10.3)
Chloride: 99 mmol/L (ref 98–111)
Creatinine, Ser: 0.91 mg/dL (ref 0.61–1.24)
GFR, Estimated: >60 mL/min (ref >60)
Glucose, Bld: 135 mg/dL — ABNORMAL HIGH (ref 70–99)
Potassium: 3.8 mmol/L (ref 3.5–5.1)
Sodium: 131 mmol/L — ABNORMAL LOW (ref 135–145)

## 2024-01-29 MED ORDER — LEVETIRACETAM 500 MG PO TABS: 500.0000 mg | ORAL_TABLET | Freq: Two times a day (BID) | ORAL | 2 refills | Status: DC

## 2024-01-29 MED ORDER — POLYETHYLENE GLYCOL 3350 17 G PO PACK: 17.0000 g | PACK | Freq: Every day | ORAL | 0 refills | Status: DC | PRN

## 2024-01-29 NOTE — Discharge Summary (Signed)
 Physician Discharge Summary   Patient: Matthew Lee MRN: [id number] DOB: 04-Aug-1956  Admit date:     01/25/2024  Discharge date: 01/29/24  Discharge Physician: Matthew Lee   PCP: Matthew Gaucher, NP   Recommendations at discharge:  Please follow up with PCP In one week.  Please follow up with gastroenterology for the axillary node biopsy results.  Please follow up with neurosurgery in 2 weeks and get a repeat CT head without contrast.  Please follow up with cbc and bmp in one week.   Discharge Diagnoses: Principal Problem:   Subdural hematoma (HCC)  Resolved Problems:   * No resolved hospital problems. *  Hospital Course:   68 year old man who presented to [facility] 4/29 as a transfer from [facility] for unilateral L-sided weakness and gait disturbance. PMHx significant for HTN, HLD, CAD, HFpEF, thoracic aortic aneurysm, chronic HCV s/p treatment, IDA.   Presented to [facility] ER with complaints of feeling off balance when walking, mild left arm and leg weakness unable to lift left foot, and difficulty typing since waking up 4/25.  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   CT Head showed a large and mostly isodense right SDH (up to 23mm thickness) likely septated with leftward midline shift of 9mm. NSGY was consulted and will see on admit with recommendations for transfer to Neuro ICU, MRI brain, and empiric keppra . PCCM to admit for medical management while in ICU.   Assessment and Plan:    Right subdural hematoma with the brain compression s/p craniotomy and hematoma evacuation on 4/29 Neurosurgery on board Patient was started on Keppra  for seizure precautions Systolic blood pressure goal less than 160. Recommend outpatient followup with neurosurgery.       Hypertension Patient on losartan , labetalol  and , amlodipine  Continue the same and keep blood pressure less than 160.       Iron  deficiency anemia Stable         Recently diagnosed gastric adenocarcinoma at proximal gastric/GE junction Concern for regional lymph node involvement and right axillary lower lymph node Gastroenterology on board IR consulted for right axillary node biopsy while inpatient. LN biopsy done. Recommend outpatient follow up with GI for the biopsy results. Patient is scheduled for outpatient oncology appointment on 02/02/2024       GERD Stable on PPI     Hypokalemia Replaced     Mild hyponatremia  Secondary to hydrochlorothiazide .  Please hold the hydrochlorothiazide  on discharge as BP parameters are borderline.  The lowest BP has been around 94/62 mmhg.        Consultants: neurosurgery Gastroenterology.  Procedures performed: LN b iopsy. Craniotomy.  Disposition: Home Diet recommendation:  Discharge Diet Orders (From admission, onward)     Start     Ordered   01/29/24 0000  Diet - low sodium heart healthy        01/29/24 1004           Regular diet DISCHARGE MEDICATION: Allergies as of 01/29/2024   No Known Allergies      Medication List     STOP taking these medications    aspirin EC 325 MG tablet   hydrochlorothiazide  25 MG tablet Commonly known as: HYDRODIURIL        TAKE these medications    amLODipine  10 MG tablet Commonly known as: NORVASC  TAKE 1 TABLET DAILY   atorvastatin  10 MG tablet Commonly known as: LIPITOR Take 1 tablet (10  mg total) by mouth daily.   ferrous sulfate  325 (65 FE) MG tablet Take 1 tablet (325 mg total) by mouth every other day.   labetalol  100 MG tablet Commonly known as: NORMODYNE  Take 1.5 tablets (150 mg total) by mouth in the morning and at bedtime.   levETIRAcetam  500 MG tablet Commonly known as: KEPPRA  Take 1 tablet (500 mg total) by mouth 2 (two) times daily.   losartan  50 MG tablet Commonly known as: COZAAR  Take 2 tablets (100 mg total) by mouth daily.   pantoprazole  40 MG tablet Commonly known as:  PROTONIX  Take 1 tablet (40 mg total) by mouth 2 (two) times daily.   polyethylene glycol 17 g packet Commonly known as: MIRALAX  / GLYCOLAX  Take 17 g by mouth daily as needed for mild constipation.   zolpidem  5 MG tablet Commonly known as: AMBIEN  Take 1 tablet (5 mg total) by mouth at bedtime as needed for sleep.        Follow-up Information     [facility] Follow up.   Why: if they do not call you in a few days call them to schedule an appointment for speech therapy. A referral has been made for you electronically Contact information: [address] [place]  [phone]        Matthew Gaucher, NP. Schedule an appointment as soon as possible for a visit in 1 week(s).   Specialty: Internal Medicine Contact information: [address] [place] [address] [phone]         [facility] Follow up in 2 week(s).   Specialty: Neurosurgery Contact information: [address] [place] [address] [phone]                Discharge Exam: [name]   01/25/24 1148 01/26/24 0500 01/27/24 0500  Weight: 70.3 kg 68.9 kg 69.1 kg   General exam: Appears calm and comfortable  Respiratory system: Clear to auscultation. Respiratory effort normal. Cardiovascular system: S1 & S2 heard, RRR. No JVD, Gastrointestinal system: Abdomen is nondistended, soft and nontender.  Central nervous system: Alert and oriented. No focal neurological deficits. Extremities: Symmetric 5 x 5 power. Skin: No rashes, lesions or ulcers Psychiatry: Mood & affect appropriate.    Condition at discharge: fair  The results of significant diagnostics from this hospitalization (including imaging, microbiology, ancillary and laboratory) are listed below for reference.   Imaging Studies: IR LYMPH NODE CORE BIOPSY Result Date: 01/28/2024 INDICATION: History of GI tract tumor at the  GE junction. Enlarged RIGHT axillary lymph node on CT. EXAM: ULTRASOUND-GUIDED RIGHT AXIAL LYMPH NODE BIOPSY COMPARISON:  CT CAP, 01/21/2024 MEDICATIONS: None ANESTHESIA/SEDATION: Local anesthetic was administered. COMPLICATIONS: None immediate. TECHNIQUE: Informed written consent was obtained from the patient after a discussion of the risks, benefits and alternatives to treatment. Questions regarding the procedure were encouraged and answered. Initial ultrasound scanning demonstrated enlarged RIGHT axilla lymph node. An ultrasound image was saved for documentation purposes. The procedure was planned. A timeout was performed prior to the initiation of the procedure. The operative was prepped and draped in the usual sterile fashion, and a sterile drape was applied covering the operative field. A timeout was performed prior to the initiation of the procedure. Local anesthesia was provided with 1% lidocaine  with epinephrine . Under direct ultrasound guidance, an 18 gauge core needle device was utilized to obtain to obtain 3 core needle biopsies of the RIGHT axial lymph  node. The samples were placed in saline and submitted to pathology. The needle was removed and superficial hemostasis was achieved with manual compression. Post procedure scan was negative for significant hematoma. A dressing was applied. The patient tolerated the procedure well without immediate postprocedural complication. IMPRESSION: Successful ultrasound guided core biopsy of an enlarged RIGHT axillary lymph node. Art Largo, MD [facility] Electronically Signed   By: Art Largo M.D.   On: 01/28/2024 16:37   MR BRAIN W WO CONTRAST Result Date: 01/27/2024 CLINICAL DATA:  Initial evaluation for metastatic disease evaluation, newly diagnosed gastroesophageal mass, status post subdural evacuation. EXAM: MRI HEAD WITHOUT AND WITH CONTRAST TECHNIQUE: Multiplanar, multiecho pulse sequences of  the brain and surrounding structures were obtained without and with intravenous contrast. CONTRAST:  7mL GADAVIST  GADOBUTROL  1 MMOL/ML IV SOLN COMPARISON:  CT from 01/26/2024 FINDINGS: Brain: Examination mildly degraded by motion artifact. Postoperative changes from recent right-sided craniotomy for subdural evacuation again seen. Postoperative pneumocephalus overlies the right cerebral convexity. Residual right-sided subdural collection measures up to 2.4 cm at the right frontal convexity. Persistent mass effect on the subjacent right cerebral hemisphere with 7 mm of right-to-left shift, relatively stable when measured in a similar fashion on prior CT. No hydrocephalus or trapping. Basilar cisterns remain patent. An additional smaller left-sided subdural hematoma overlying the anterior left frontal convexity measures up to 4 mm without significant mass effect (series 7, image 23). Underlying age-related atrophy. Patchy T2/FLAIR hyperintensity involving the periventricular deep white matter both cerebral hemispheres, consistent with chronic small vessel ischemic disease, mild to moderate in nature. No evidence for acute or subacute infarct. No areas of chronic cortical infarction. No acute intracranial hemorrhage. Single punctate chronic microhemorrhage noted within the left pons (series 10, image 11). No mass lesion, mass effect, or midline shift. Normal expected smooth postoperative dural enhancement noted overlying the right cerebral convexity. No evidence for intracranial metastatic disease. Pituitary gland and suprasellar region within normal limits. Vascular: Major intracranial vascular flow voids are maintained. Skull and upper cervical spine: Craniocervical junction within normal limits. Bone marrow signal intensity grossly normal allowing for motion artifact. Post craniotomy changes at the right calvarium and scalp without adverse features. Sinuses/Orbits: Globes orbital soft tissues within normal limits.  Paranasal sinuses are largely clear. No significant mastoid effusion. Other: Than prior IMPRESSION: 1. Postoperative changes from recent right-sided craniotomy for subdural evacuation. Residual right-sided subdural collection measures up to 2.4 cm at the right frontal convexity, with persistent mass effect on the subjacent right cerebral hemisphere and 7 mm of right-to-left shift, relatively stable from recent postoperative head CT from 01/26/2024. No hydrocephalus or trapping. 2. Additional smaller 4 mm left-sided subdural hematoma overlying the anterior left frontal convexity without significant mass effect. 3. No evidence for intracranial metastatic disease. 4. Underlying age-related atrophy with mild to moderate chronic microvascular ischemic disease. Electronically Signed   By: Virgia Griffins M.D.   On: 01/27/2024 20:41   CT HEAD WO CONTRAST Result Date: 01/26/2024 CLINICAL DATA:  Follow-up craniotomy EXAM: CT HEAD WITHOUT CONTRAST TECHNIQUE: Contiguous axial images were obtained from the base of the skull through the vertex without intravenous contrast. RADIATION DOSE REDUCTION: This exam was performed according to the departmental dose-optimization program which includes automated exposure control, adjustment of the mA and/or kV according to patient size and/or use of iterative reconstruction technique. COMPARISON:  01/25/2024 FINDINGS: Brain: Interval right frontal craniotomy for evacuation of large right-sided subdural hematoma. Subdural drain remains in place, directed inferiorly. There  is reduction in the amount of pre-existing subdural blood material on the right with less mass effect, right-to-left shift of 5.4 mm presently compared with 8.8 mm on the prior study. Subdural air is present particularly in the right frontal region. Small amount of new hyperdense subdural blood related to the surgical evacuation. No evidence of parenchymal bleeding. No ischemic infarction. No subarachnoid blood. No  hydrocephalus. Vascular: There is atherosclerotic calcification of the major vessels at the base of the brain. Skull: Otherwise negative Sinuses/Orbits: Clear/normal Other: None IMPRESSION: Interval right frontal craniotomy for evacuation of large right-sided subdural hematoma. Subdural drain remains in place. Reduction in the amount of pre-existing subdural blood material on the right with less mass effect, right-to-left shift of 5.4 mm presently compared with 8.8 mm on the prior study. Small amount of new hyperdense subdural blood related to the surgical evacuation. Electronically Signed   By: Bettylou Brunner M.D.   On: 01/26/2024 08:31   CT HEAD WO CONTRAST Addendum Date: 01/25/2024 ADDENDUM REPORT: 01/25/2024 13:14 ADDENDUM: Study discussed by telephone with Dr. Jerald Molly on 01/25/2024 at 1302 hours. Electronically Signed   By: Marlise Simpers M.D.   On: 01/25/2024 13:14   Result Date: 01/25/2024 CLINICAL DATA:  68 year old male with altered mental status, dizziness, decreased balance, neurologic deficit, syncope. EXAM: CT HEAD WITHOUT CONTRAST TECHNIQUE: Contiguous axial images were obtained from the base of the skull through the vertex without intravenous contrast. RADIATION DOSE REDUCTION: This exam was performed according to the departmental dose-optimization program which includes automated exposure control, adjustment of the mA and/or kV according to patient size and/or use of iterative reconstruction technique. COMPARISON:  None Available. FINDINGS: Brain: Large and mostly isodense extra-axial hemorrhage in the right hemisphere with thickness estimated up to 23 mm on coronal images (series 5, image 43). Probable internal septations (series 2, image 23. Relatively small volume of hyperdense blood products within the collection (series 2, image 15). Intracranial mass effect with leftward midline shift of 9 mm. Partially effaced suprasellar cistern. Other basilar cisterns maintained. Compressed right lateral  ventricle. No ventriculomegaly. No discrete cytotoxic or cerebral edema is identified. Vascular: Calcified atherosclerosis at the skull base. No suspicious intracranial vascular hyperdensity. Skull: Intact, no fracture identified. Sinuses/Orbits: Visualized paranasal sinuses and mastoids are clear. Other: No acute orbit or scalp soft tissue finding. IMPRESSION: 1. Large and mostly isodense Right Subdural Hematoma, estimated up to 23 mm thickness, and probably septated. 2. Intracranial mass effect with leftward midline shift of 9 mm. 3. No skull fracture or other acute intracranial abnormality identified. Electronically Signed: By: Marlise Simpers M.D. On: 01/25/2024 12:55   CT CHEST ABDOMEN PELVIS W CONTRAST Result Date: 01/21/2024 CLINICAL DATA:  Gastric tumor at the GE junction. Abnormal EGD. * Tracking Code: BO * EXAM: CT CHEST, ABDOMEN, AND PELVIS WITH CONTRAST TECHNIQUE: Multidetector CT imaging of the chest, abdomen and pelvis was performed following the standard protocol during bolus administration of intravenous contrast. RADIATION DOSE REDUCTION: This exam was performed according to the departmental dose-optimization program which includes automated exposure control, adjustment of the mA and/or kV according to patient size and/or use of iterative reconstruction technique. CONTRAST:  OMNIPAQUE  IOHEXOL  300 MG/ML  SOLN COMPARISON:  Lung cancer screening CT 10/07/2022. Abdomen pelvis CT 01/21/2018 and older. FINDINGS: CT CHEST FINDINGS Cardiovascular: Heart is nonenlarged. No pericardial effusion. Coronary artery calcifications are seen. The thoracic aorta is normal course and caliber with partially calcified scattered atherosclerotic plaque. Plaque also seen along the great vessels. There is some  enlargement of the pulmonary arteries. Please correlate for pulmonary artery hypertension. Mediastinum/Nodes: Preserved thyroid  gland. There is an isolated enlarged right axillary node measuring 19 x 11 mm. Going  back to study of 2024 this node measured 15 by 9 mm, slightly larger today. No left axillary node enlargement. No hilar or mediastinal nodal enlargement at this time. Calcified left hilar nodes identified consistent with old granulomatous disease. Lungs/Pleura: There is some linear opacity lung bases likely scar or atelectasis. Breathing motion. No consolidation, pneumothorax or effusion. There is a punctate calcified nodule in the lingula on series 302, image 63 consistent with old granulomatous disease and is stable. Larger calcified left lower lobe nodule image 58. There is a stable noncalcified nodule left lower lobe series 302, image 84 measuring 3 mm. No new dominant lung nodule. Musculoskeletal: Mild curvature of the spine with some degenerative changes. CT ABDOMEN PELVIS FINDINGS Hepatobiliary: No focal liver abnormality is seen. No gallstones, gallbladder wall thickening, or biliary dilatation. Patent portal vein. Pancreas: Unremarkable. No pancreatic ductal dilatation or surrounding inflammatory changes. Spleen: Normal in size without focal abnormality.  Small splenule Adrenals/Urinary Tract: Diffuse thickening of the adrenal glands is again seen. Right-sided stable. Left appears larger. Hounsfield units of this area on portal venous phase age of 60 and delay 29 consistent with relative washout of an adenoma. Bosniak 1 upper pole right-sided small renal cyst. Few other tiny bilateral sub 5 mm foci are identified, too small to completely characterize although statistically likely benign cystic lesions and no specific imaging follow-up. No enhancing renal mass or collecting system dilatation. Ureters have normal course and caliber extending down to the urinary bladder. Bladder is underdistended. Stomach/Bowel: Large bowel has a normal course and caliber. Scattered colonic stool. Left-sided colonic diverticulosis diffusely. Normal appendix in the right lower quadrant extending medial to the cecum and  posterior. Of note there is transverse colon extending between the liver margin in the anterior abdominal wall. Small bowel is nondilated. There is nodular wall thickening along the stomach involving the fundus, cardia and midbody with irregular margins. There is some stranding and vascular engorgement. Please correlate for the location of the patient's known gastric neoplasm. Extension up to the level of the diaphragm and GE junction. Vascular/Lymphatic: Extensive vascular calcifications along the aorta and branch vessels. Normal caliber IVC. There are several small but prominent lymph nodes identified. These include gastric Paddock ligament on series 301, image 52 measuring 18 by 10 mm. Node on image 56 measuring 13 by 7 mm. Numerous other nodes extending in this location in to the celiac margin. Reproductive: Prostate is unremarkable. Other: No free air or free fluid. Musculoskeletal: Curvature of the spine with some degenerative changes. Trace anterolisthesis of L4 on L5. Multilevel disc bulging with some encroachment. IMPRESSION: Nodular wall thickening over a long segment of the upper stomach extending from the GE junction to the midbody. Margins are somewhat irregular. Adjacent prominent lymph nodes identified in the gastrohepatic ligament, lesser curve towards the celiac margin. Regional spread of disease is possible. No specific abnormal nodes along the lower thorax. There is 1 prominent right axillary node, slightly larger than the study of 2024 of uncertain etiology and significance. Please correlate for any known history. Further workup with ultrasound or attention on follow-up for the patient's neoplasm in 3 months. Evidence of old granulomatous disease. Tiny new stable 3 mm noncalcified left lower lobe lung nodule. This has been stable for 1 year. Enlargement of the pulmonary arteries. Please correlate for pulmonary artery hypertension.  Fatty liver infiltration.  Colonic diverticulosis. Electronically  Signed   By: Adrianna Horde M.D.   On: 01/21/2024 14:38    Microbiology: Results for orders placed or performed during the hospital encounter of 01/25/24  MRSA Next Gen by PCR, Nasal     Status: None   Collection Time: 01/25/24  9:23 PM   Specimen: Nasal Mucosa; Nasal Swab  Result Value Ref Range Status   MRSA by PCR Next Gen NOT DETECTED NOT DETECTED Final    Comment: (NOTE) The GeneXpert MRSA Assay (FDA approved for NASAL specimens only), is one component of a comprehensive MRSA colonization surveillance program. It is not intended to diagnose MRSA infection nor to guide or monitor treatment for MRSA infections. Test performance is not FDA approved in patients less than 43 years old. Performed at [facility], [address], [place], [place] [address]     Labs: CBC: Recent Labs  Lab 01/25/24 1202 01/26/24 0536 01/28/24 0712  WBC 8.7 11.6* 10.1  NEUTROABS 6.0  --   --   HGB 11.2* 9.7* 10.9*  HCT 35.3* 30.5* 33.3*  MCV 79.9* 78.6* 77.4*  PLT 354 295 328   Basic Metabolic Panel: Recent Labs  Lab 01/25/24 1202 01/26/24 0536 01/28/24 0712 01/29/24 0908  NA 135 130* 133* 131*  K 4.0 3.8 3.3* 3.8  CL 100 101 98 99  CO2 21* 21* 25 23  GLUCOSE 111* 126* 97 135*  BUN 17 10 11 14   CREATININE 0.97 0.79 0.81 0.91  CALCIUM  9.4 8.3* 8.9 9.1   Liver Function Tests: Recent Labs  Lab 01/25/24 1202  AST 23  ALT 16  ALKPHOS 117  BILITOT 0.2  PROT 7.2  ALBUMIN 4.3   CBG: Recent Labs  Lab 01/28/24 1949 01/29/24 0011 01/29/24 0330 01/29/24 0739 01/29/24 1209  GLUCAP 110* 112* 98 102* 112*    Discharge time spent: 42 minutes.   Signed: Ellenor Wisniewski, MD [facility] 01/29/2024

## 2024-01-29 NOTE — Progress Notes (Signed)
 PT Cancellation Note  Patient Details Name: Matthew Lee MRN: [id number] DOB: November 28, 1955   Cancelled Treatment:    Reason Eval/Treat Not Completed: Other (comment) (Pt was seen by physical therapy and discharged with no further needs on 4/30.  No change in medical status per RN since physical therapy evaluation. Will sign off at this time. Please re-consult if further needs arise.)  [name], DPT, CLT  Acute Rehabilitation Services Office: [phone] (Secure chat preferred)   [name] 01/29/2024, 11:29 AM

## 2024-01-29 NOTE — Progress Notes (Addendum)
 Dr Nichole Barker made aware of recent lab results. Piv dcd site unremarkable. Patient and wife verbalized understanding of instructions. All belongings and paperwork given to patient.

## 2024-01-29 NOTE — Plan of Care (Signed)
  Problem: Education: Goal: Knowledge of General Education information will improve Description: Including pain rating scale, medication(s)/side effects and non-pharmacologic comfort measures Outcome: Not Progressing   Problem: Health Behavior/Discharge Planning: Goal: Ability to manage health-related needs will improve Outcome: Not Progressing   Problem: Clinical Measurements: Goal: Ability to maintain clinical measurements within normal limits will improve Outcome: Not Progressing Goal: Will remain free from infection Outcome: Not Progressing Goal: Diagnostic test results will improve Outcome: Not Progressing Goal: Respiratory complications will improve Outcome: Not Progressing Goal: Cardiovascular complication will be avoided Outcome: Not Progressing   Problem: Activity: Goal: Risk for activity intolerance will decrease Outcome: Not Progressing   Problem: Nutrition: Goal: Adequate nutrition will be maintained Outcome: Not Progressing   Problem: Coping: Goal: Level of anxiety will decrease Outcome: Not Progressing   Problem: Elimination: Goal: Will not experience complications related to bowel motility Outcome: Not Progressing Goal: Will not experience complications related to urinary retention Outcome: Not Progressing   Problem: Pain Managment: Goal: General experience of comfort will improve and/or be controlled Outcome: Not Progressing   Problem: Safety: Goal: Ability to remain free from injury will improve Outcome: Not Progressing   Problem: Skin Integrity: Goal: Risk for impaired skin integrity will decrease Outcome: Not Progressing   Problem: Education: Goal: Knowledge of the prescribed therapeutic regimen will improve Outcome: Not Progressing   Problem: Clinical Measurements: Goal: Usual level of consciousness will be regained or maintained. Outcome: Not Progressing Goal: Neurologic status will improve Outcome: Not Progressing Goal: Ability to  maintain intracranial pressure will improve Outcome: Not Progressing   Problem: Skin Integrity: Goal: Demonstration of wound healing without infection will improve Outcome: Not Progressing

## 2024-01-31 ENCOUNTER — Telehealth: Payer: Self-pay

## 2024-01-31 NOTE — Transitions of Care (Post Inpatient/ED Visit) (Signed)
   01/31/2024  Name: Matthew Lee Placencia MRN: [id number] DOB: 1956/04/23  Today's TOC FU Call Status: Today's TOC FU Call Status:: Successful TOC FU Call Completed TOC FU Call Complete Date: 01/31/24 Patient's Name and Date of Birth confirmed.  Transition Care Management Follow-up Telephone Call Date of Discharge: 02/04/24 Discharge Facility: [facility]) Type of Discharge: Inpatient Admission Primary Inpatient Discharge Diagnosis:: subdural hemorrhage How have you been since you were released from the hospital?: Better Any questions or concerns?: No  Items Reviewed: Did you receive and understand the discharge instructions provided?: Yes Medications obtained,verified, and reconciled?: Yes (Medications Reviewed) Any new allergies since your discharge?: No Dietary orders reviewed?: NA  Medications Reviewed Today: Medications Reviewed Today     Reviewed by Darrall Ellison, LPN (Licensed Practical Nurse) on 01/31/24 at 1419  Med List Status: <None>   Medication Order Taking? Sig Documenting Provider Last Dose Status Informant  amLODipine  (NORVASC ) 10 MG tablet [id number] No TAKE 1 TABLET DAILY Dorrene Gaucher, NP 01/24/2024 Active Self  atorvastatin  (LIPITOR) 10 MG tablet [id number] No Take 1 tablet (10 mg total) by mouth daily. RevankarMicael Adas, MD 01/25/2024 Morning Active Self  ferrous sulfate  325 (65 FE) MG tablet [id number] No Take 1 tablet (325 mg total) by mouth every other day. Dorrene Gaucher, NP 01/25/2024 Morning Active Self  labetalol  (NORMODYNE ) 100 MG tablet [id number] No Take 1.5 tablets (150 mg total) by mouth in the morning and at bedtime. Dorrene Gaucher, NP 01/25/2024 Morning Active Self  levETIRAcetam  (KEPPRA ) 500 MG tablet [id number]  Take 1 tablet (500 mg total) by mouth 2 (two) times daily. Akula, Vijaya, MD  Active   losartan  (COZAAR ) 50 MG tablet [id number] No Take 2 tablets (100 mg total) by mouth daily. Dorrene Gaucher, NP 01/25/2024 Morning Active Self   pantoprazole  (PROTONIX ) 40 MG tablet [id number] No Take 1 tablet (40 mg total) by mouth 2 (two) times daily. Garr Kalata, PA-C 01/25/2024 Morning Active Self  polyethylene glycol (MIRALAX  / GLYCOLAX ) 17 g packet [id number]  Take 17 g by mouth daily as needed for mild constipation. Akula, Vijaya, MD  Active   zolpidem  (AMBIEN ) 5 MG tablet [id number] No Take 1 tablet (5 mg total) by mouth at bedtime as needed for sleep. Dorrene Gaucher, NP 01/24/2024 Active Self            Home Care and Equipment/Supplies: Were Home Health Services Ordered?: NA Any new equipment or medical supplies ordered?: NA  Functional Questionnaire: Do you need assistance with bathing/showering or dressing?: No Do you need assistance with meal preparation?: No Do you need assistance with eating?: No Do you have difficulty maintaining continence: No Do you need assistance with getting out of bed/getting out of a chair/moving?: No Do you have difficulty managing or taking your medications?: No  Follow up appointments reviewed: PCP Follow-up appointment confirmed?: Yes Date of PCP follow-up appointment?: 02/04/24 Follow-up Provider: [facility] Follow-up appointment confirmed?: Yes Date of Specialist follow-up appointment?: 02/14/24 Follow-Up Specialty Provider:: neuro Do you need transportation to your follow-up appointment?: No Do you understand care options if your condition(s) worsen?: Yes-patient verbalized understanding    SIGNATURE Darrall Ellison, LPN [facility] Nurse Health Advisor Direct Dial [phone]

## 2024-01-31 NOTE — Telephone Encounter (Signed)
 Returned call and LVM. Patient doe snot qualify for the LCS program due to recent cancer diagnosis. Will advise spouse on call back.

## 2024-01-31 NOTE — Telephone Encounter (Signed)
 Copied from CRM [id number]. Topic: Appointments - Scheduling Inquiry for Clinic >> Jan 31, 2024  9:40 AM Justina Oman C wrote: Reason for CRM: Patient's spouse Nellie Banas [phone] trying to schedule an appointment as a NPT- referred by Dr. Vijaya Akula for lung cancer screening. Patient had brain surgery was 01/25/24 and discharged 01/29/24. Patient had stomach and esophagus cancer. Please call back to schedule an appointment.   Routing to front staff please advise

## 2024-02-01 ENCOUNTER — Ambulatory Visit: Attending: Pulmonary Disease | Admitting: Speech Pathology

## 2024-02-01 DIAGNOSIS — R41841 Cognitive communication deficit: Secondary | ICD-10-CM | POA: Insufficient documentation

## 2024-02-01 LAB — SURGICAL PATHOLOGY

## 2024-02-02 ENCOUNTER — Encounter: Payer: Self-pay | Admitting: Hematology & Oncology

## 2024-02-02 ENCOUNTER — Inpatient Hospital Stay: Attending: Hematology & Oncology

## 2024-02-02 ENCOUNTER — Telehealth: Payer: Self-pay | Admitting: Internal Medicine

## 2024-02-02 ENCOUNTER — Inpatient Hospital Stay: Admitting: Licensed Clinical Social Worker

## 2024-02-02 ENCOUNTER — Encounter: Payer: Self-pay | Admitting: *Deleted

## 2024-02-02 ENCOUNTER — Inpatient Hospital Stay: Admitting: Hematology & Oncology

## 2024-02-02 VITALS — BP 109/90 | HR 77 | Temp 98.0°F | Resp 20 | Ht 67.0 in | Wt 146.0 lb

## 2024-02-02 DIAGNOSIS — S065XAA Traumatic subdural hemorrhage with loss of consciousness status unknown, initial encounter: Secondary | ICD-10-CM | POA: Insufficient documentation

## 2024-02-02 DIAGNOSIS — E782 Mixed hyperlipidemia: Secondary | ICD-10-CM | POA: Insufficient documentation

## 2024-02-02 DIAGNOSIS — C160 Malignant neoplasm of cardia: Secondary | ICD-10-CM

## 2024-02-02 DIAGNOSIS — R1310 Dysphagia, unspecified: Secondary | ICD-10-CM | POA: Diagnosis not present

## 2024-02-02 DIAGNOSIS — Z8616 Personal history of COVID-19: Secondary | ICD-10-CM | POA: Diagnosis not present

## 2024-02-02 DIAGNOSIS — I10 Essential (primary) hypertension: Secondary | ICD-10-CM | POA: Insufficient documentation

## 2024-02-02 DIAGNOSIS — K219 Gastro-esophageal reflux disease without esophagitis: Secondary | ICD-10-CM | POA: Insufficient documentation

## 2024-02-02 DIAGNOSIS — Z5111 Encounter for antineoplastic chemotherapy: Secondary | ICD-10-CM | POA: Diagnosis not present

## 2024-02-02 DIAGNOSIS — Z5189 Encounter for other specified aftercare: Secondary | ICD-10-CM | POA: Insufficient documentation

## 2024-02-02 DIAGNOSIS — I2510 Atherosclerotic heart disease of native coronary artery without angina pectoris: Secondary | ICD-10-CM | POA: Insufficient documentation

## 2024-02-02 DIAGNOSIS — Z79899 Other long term (current) drug therapy: Secondary | ICD-10-CM | POA: Diagnosis not present

## 2024-02-02 DIAGNOSIS — Z87891 Personal history of nicotine dependence: Secondary | ICD-10-CM | POA: Insufficient documentation

## 2024-02-02 DIAGNOSIS — G4700 Insomnia, unspecified: Secondary | ICD-10-CM | POA: Diagnosis not present

## 2024-02-02 LAB — CBC WITH DIFFERENTIAL (CANCER CENTER ONLY)
Abs Immature Granulocytes: 0.02 10*3/uL (ref 0.00–0.07)
Basophils Absolute: 0.1 10*3/uL (ref 0.0–0.1)
Basophils Relative: 1 %
Eosinophils Absolute: 0.2 10*3/uL (ref 0.0–0.5)
Eosinophils Relative: 2 %
HCT: 37.8 % — ABNORMAL LOW (ref 39.0–52.0)
Hemoglobin: 12.1 g/dL — ABNORMAL LOW (ref 13.0–17.0)
Immature Granulocytes: 0 %
Lymphocytes Relative: 16 %
Lymphs Abs: 1.6 10*3/uL (ref 0.7–4.0)
MCH: 24.9 pg — ABNORMAL LOW (ref 26.0–34.0)
MCHC: 32 g/dL (ref 30.0–36.0)
MCV: 77.9 fL — ABNORMAL LOW (ref 80.0–100.0)
Monocytes Absolute: 1.3 10*3/uL — ABNORMAL HIGH (ref 0.1–1.0)
Monocytes Relative: 13 %
Neutro Abs: 6.9 10*3/uL (ref 1.7–7.7)
Neutrophils Relative %: 68 %
Platelet Count: 485 10*3/uL — ABNORMAL HIGH (ref 150–400)
RBC: 4.85 MIL/uL (ref 4.22–5.81)
RDW: 14.4 % (ref 11.5–15.5)
WBC Count: 10.2 10*3/uL (ref 4.0–10.5)
nRBC: 0 % (ref 0.0–0.2)

## 2024-02-02 LAB — CMP (CANCER CENTER ONLY)
ALT: 16 U/L (ref 0–44)
AST: 16 U/L (ref 15–41)
Albumin: 4.2 g/dL (ref 3.5–5.0)
Alkaline Phosphatase: 108 U/L (ref 38–126)
Anion gap: 8 (ref 5–15)
BUN: 16 mg/dL (ref 8–23)
CO2: 27 mmol/L (ref 22–32)
Calcium: 9.8 mg/dL (ref 8.9–10.3)
Chloride: 98 mmol/L (ref 98–111)
Creatinine: 0.91 mg/dL (ref 0.61–1.24)
GFR, Estimated: >60 mL/min (ref >60)
Glucose, Bld: 109 mg/dL — ABNORMAL HIGH (ref 70–99)
Potassium: 4.2 mmol/L (ref 3.5–5.1)
Sodium: 133 mmol/L — ABNORMAL LOW (ref 135–145)
Total Bilirubin: 0.3 mg/dL (ref 0.0–1.2)
Total Protein: 7.7 g/dL (ref 6.5–8.1)

## 2024-02-02 LAB — IRON AND IRON BINDING CAPACITY (CC-WL,HP ONLY)
Iron: 25 ug/dL — ABNORMAL LOW (ref 45–182)
Saturation Ratios: 6 % — ABNORMAL LOW (ref 17.9–39.5)
TIBC: 451 ug/dL — ABNORMAL HIGH (ref 250–450)
UIBC: 426 ug/dL — ABNORMAL HIGH (ref 117–376)

## 2024-02-02 LAB — FERRITIN: Ferritin: 40 ng/mL (ref 24–336)

## 2024-02-02 LAB — LACTATE DEHYDROGENASE: LDH: 146 U/L (ref 98–192)

## 2024-02-02 NOTE — Progress Notes (Signed)
 Initial RN Navigator Patient Visit  Name: Matthew Lee Date of Referral : 01/25/2024 Diagnosis: GE Junction Cancer  Met with patient prior to their visit with MD. Wheeler Hammonds patient "Your Patient Navigator" handout which explains my role, areas in which I am able to help, and all the contact information for myself and the office. Also gave patient MD and Navigator business card. Reviewed with patient the general overview of expected course after initial diagnosis and time frame for all steps to be completed.  New patient packet given to patient which includes: orientation to office and staff; campus directory; education on My Chart and Advance Directives; and patient centered education on GE Junction cancer.   Patient comes in with his wife who is his primary caregiver. Patient is still recovering from his craniotomy. Wife states he is still unsteady and requires supervision with activity. He is currently out of work, but has many questions on when he can return to work. They also have children, grandchildren and other support system in place.   Referral to social work and nutrition placed per protocol.   Patient completed visit with Dr. Maria Shiner. Will follow up tomorrow once office note and orders placed to begin scheduling. He is currently scheduled an EUS on Monday. He also has an appointment with thoracic on 02/04/2024.   Patient understands all follow up procedures and expectations. They have my number to reach out for any further clarification or additional needs.   Oncology Nurse Navigator Documentation     02/02/2024   11:00 AM  Oncology Nurse Navigator Flowsheets  Navigator Follow Up Date: 02/03/2024  Navigator Follow Up Reason: Appointment Review  Navigator Location [facility]  Navigator Encounter Type Initial MedOnc  Patient Visit Type MedOnc  Treatment Phase Pre-Tx/Tx Discussion  Barriers/Navigation Needs Coordination of Care;Education  Education Newly Diagnosed Cancer  Education;Pain/ Symptom Management;Preparing for Upcoming Surgery/ Treatment  Interventions Education;Psycho-Social Support;Referrals  Acuity Level 2-Minimal Needs (1-2 Barriers Identified)  Referrals Nutrition/dietician;Social Work  Nature conservation officer Groups/Services Friends and Family  Time Spent with Patient 30  Genetic Counseling Type None

## 2024-02-02 NOTE — Telephone Encounter (Signed)
 Matthew Lee, This will be the patient to add for Mondays 3:30 PM slot. Please reach out to the patient and let him know that we are going to move forward with an EUS on Monday unless we hear something different from neurosurgery.  I am placing Dr. Andy Bannister on here as well who did his craniotomy to make sure that he would not have any concerns in regards to patient potentially undergoing anesthesia to continue to move forward his oncologic care.  JGT, I have this patient pending an endoscopic ultrasound for his newly diagnosed EGJ/gastric cancer. With recent craniotomy, do you have any concerns about him undergoing anesthesia next week? Thanks. GM

## 2024-02-02 NOTE — Telephone Encounter (Signed)
 Message left on home number Voice mail full on both pt and wife cell number Attempted work number and had to leave a message  Will send My Chart message as well.

## 2024-02-02 NOTE — Telephone Encounter (Signed)
 I spoke to Dr. Maria Shiner with oncology who saw the patient today. He wanted to discuss coordination of care with me. The will see Dr. Deloise Ferries with CT surgery on Friday, May 9 He is currently scheduled for EUS with Dr. Brice Campi on June 9 however an opening appears to have occurred that can allow Dr. Brice Campi to perform the EUS on May 12 He is also going to be scheduled for a PET scan  Dr. Brice Campi, I appreciate you moving the patient into the sooner open spot  Thanks to all [facility]

## 2024-02-02 NOTE — Progress Notes (Signed)
 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  HPI: Mr. Matthew Lee is a very nice 68 year old white male.  He comes in with his wife.  He has a true [employer] hero.  He served in [employer] for a couple of years.  He unfortunately has been found to have what looks to be adenocarcinoma of the GE junction.  He had been having some problems with swallowing.  He was seen by gastroenterology.  He did have a CT of the body on 01/21/2024.  This showed that he had some nodular wall thickening over a long segment of the upper stomach extending from the GE junction to the mid body of the stomach.  There is some adjacent lymph nodes.  He had a right axillary lymph node that appeared to be prominent.  He then underwent a upper endoscopy.  This was done on 01/20/2024.  The Gastroenterologist found that he had a circumferential lesion in the lower esophagus.  He found a large fungating and ulcerated three-quarter circumferential mass with some bleeding at the GE junction and cardia.  Biopsies were taken.  The pathology report ([id number]) showed at least intramucosal adenocarcinoma.  I spoke with Dr. Bridgett Camps of Gastroenterology.  He feels that this is truly invasive.  Mr. Mutz will actually have a endoscopic ultrasound done next week.  He unfortunately was admitted to the hospital on 01/25/2024.  He had left-sided weakness and gait problems.  He was subsequently found to have a right-sided subdural hematoma.  He underwent evacuation of this hematoma on 01/25/2024.  Thankfully, the pathology report did not show any evidence of malignancy.  He subsequently was discharged.  He dispo quite a bit.  He probably has a 80-pack-year history of tobacco use.  He stopped about 15 years ago.  He I do not think as much  the way of alcohol consumption.  He has had no problems with COVID or Influenza.  He has had no nausea or vomiting.  He is a little bit of odynophagia.  He has had no vomiting.  There has been no bleeding as far as he knows.  He has had no melena or bright red blood per rectum.  Actually, I think that everything started with him having some diarrhea and melena.  As we let him to see Gastroenterology.  He has had little bit of weight loss.  Overall, I would say that his performance status is probably ECOG 1.   Past Medical History:  Diagnosis Date   Abnormal weight loss 11/02/2022   Aneurysm of thoracic aorta (HCC) 09/2022   dilated to 4 cm on CT   Cholelithiasis 11/02/2022   Colon cancer screening 07/31/2022   Coronary artery calcification 10/08/2022   Diverticulitis    Dyspnea on exertion 11/11/2022   Elevated PSA 07/31/2022   Essential hypertension 09/26/2009   Qualifier: Diagnosis of   By: Luster Salters MD, Sheilda Deputy 11/11/2022   Gall bladder polyp 02/18/2015   Overview:   - 6 mm (as described from US  abd in 2015). Repeat Us  abd lim ordered.   GERD (gastroesophageal reflux disease) 09/07/2013   Hepatitis C 08/24/2012   TX and cured per pt   History of COVID-19 07/31/2022  History of hepatitis C 11/11/2022   History of tobacco abuse    Hyperglycemia 11/22/2014   Hypertension    long standing since 2007, was treated for sometime, but then discontinued taking meds on his own.   Immunization counseling 04/01/2022   Insomnia 10/31/2009   Qualifier: Diagnosis of   By: Evlyn Hoffmann MD, Amanjot       Mild diastolic dysfunction 08/22/2012   Mixed dyslipidemia 11/11/2022   Preventative health care 12/31/2014   Prominent abdominal aortic pulsation 11/11/2022   Pulmonary edema, acute (HCC)    hospitalized in 09/2009 with hypertensive crisis and acute pulmonary edema- improved with lasix  nad labetalol .  :   Past Surgical History:  Procedure Laterality Date   CRANIOTOMY  Right 01/25/2024   Procedure: CRANIOTOMY HEMATOMA EVACUATION SUBDURAL;  Surgeon: Van Gelinas, MD;  Location: [facility];  Service: Neurosurgery;  Laterality: Right;   ESOPHAGOGASTRODUODENOSCOPY  08/21/2011   Procedure: ESOPHAGOGASTRODUODENOSCOPY (EGD);  Surgeon: Tami Falcon, MD;  Location: [facility] ENDOSCOPY;  Service: Endoscopy;  Laterality: N/A;   IR US  LIVER BIOPSY  01/28/2024   TONSILLECTOMY    :   Current Outpatient Medications:    amLODipine  (NORVASC ) 10 MG tablet, TAKE 1 TABLET DAILY, Disp: 90 tablet, Rfl: 3   atorvastatin  (LIPITOR) 10 MG tablet, Take 1 tablet (10 mg total) by mouth daily., Disp: 90 tablet, Rfl: 3   labetalol  (NORMODYNE ) 100 MG tablet, Take 1.5 tablets (150 mg total) by mouth in the morning and at bedtime., Disp: 21 tablet, Rfl: 0   levETIRAcetam  (KEPPRA ) 500 MG tablet, Take 1 tablet (500 mg total) by mouth 2 (two) times daily., Disp: 60 tablet, Rfl: 2   losartan  (COZAAR ) 50 MG tablet, Take 2 tablets (100 mg total) by mouth daily., Disp: 90 tablet, Rfl: 1   pantoprazole  (PROTONIX ) 40 MG tablet, Take 1 tablet (40 mg total) by mouth 2 (two) times daily., Disp: 60 tablet, Rfl: 2   polyethylene glycol (MIRALAX  / GLYCOLAX ) 17 g packet, Take 17 g by mouth daily as needed for mild constipation., Disp: 14 each, Rfl: 0   zolpidem  (AMBIEN ) 5 MG tablet, Take 1 tablet (5 mg total) by mouth at bedtime as needed for sleep., Disp: 7 tablet, Rfl: 0   ferrous sulfate  325 (65 FE) MG tablet, Take 1 tablet (325 mg total) by mouth every other day. (Patient not taking: Reported on 02/02/2024), Disp: , Rfl: :  :  No Known Allergies:   Family History  Problem Relation Age of Onset   Polycystic kidney disease Father    Polycystic kidney disease Half-Brother    Polycystic kidney disease Half-Brother    Polycystic kidney disease Half-Sister    Colon cancer Neg Hx    Esophageal cancer Neg Hx    Stomach cancer Neg Hx    Pancreatic cancer Neg Hx   :   Social History   Socioeconomic History    Marital status: Married    Spouse name: Not on file   Number of children: Not on file   Years of education: Not on file   Highest education level: Some college, no degree  Occupational History   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   Occupation: Garment/textile technologist: [employer]  Tobacco Use   Smoking status: Former    Current packs/day: 0.00    Average packs/day: 1 pack/day for 40.0 years (40.0 ttl pk-yrs)    Types: Cigarettes    Start date: 09/23/1969    Quit date: 09/23/2009  Years since quitting: 14.3   Smokeless tobacco: Never  Vaping Use   Vaping status: Never Used  Substance and Sexual Activity   Alcohol use: Not Currently    Comment: Former drinker.   Drug use: No   Sexual activity: Not Currently  Other Topics Concern   Not on file  Social History Narrative   Lives at home with wife, daughter and her 2 children.   Caffeine use:  1 daily   Works a Aeronautical engineer (Museum/gallery conservator)   Enjoys Optician, dispensing, Civil engineer, contracting.         Social Drivers of Corporate investment banker Strain: Low Risk  (12/15/2023)   Overall Financial Resource Strain (CARDIA)    Difficulty of Paying Living Expenses: Not hard at all  Food Insecurity: No Food Insecurity (01/25/2024)   Hunger Vital Sign    Worried About Running Out of Food in the Last Year: Never true    Ran Out of Food in the Last Year: Never true  Transportation Needs: No Transportation Needs (01/25/2024)   PRAPARE - Administrator, Civil Service (Medical): No    Lack of Transportation (Non-Medical): No  Physical Activity: Sufficiently Active (12/15/2023)   Exercise Vital Sign    Days of Exercise per Week: 5 days    Minutes of Exercise per Session: 40 min  Stress: Stress Concern Present (12/15/2023)   [employer] of Occupational Health - Occupational Stress Questionnaire    Feeling of Stress : To some extent  Social Connections: Socially Isolated (01/25/2024)    Social Connection and Isolation Panel [NHANES]    Frequency of Communication with Friends and Family: Twice a week    Frequency of Social Gatherings with Friends and Family: Never    Attends Religious Services: Never    Database administrator or Organizations: No    Attends Banker Meetings: Never    Marital Status: Married  Catering manager Violence: Not At Risk (01/25/2024)   Humiliation, Afraid, Rape, and Kick questionnaire    Fear of Current or Ex-Partner: No    Emotionally Abused: No    Physically Abused: No    Sexually Abused: No  :  Review of Systems  Constitutional:  Positive for malaise/fatigue.  HENT: Negative.    Eyes: Negative.   Respiratory: Negative.    Cardiovascular: Negative.   Gastrointestinal:  Positive for heartburn.  Genitourinary: Negative.   Musculoskeletal: Negative.   Skin: Negative.   Neurological:  Positive for dizziness, weakness and headaches.  Endo/Heme/Allergies: Negative.   Psychiatric/Behavioral: Negative.       Exam: Vital signs show temperature of 98.  Pulse 77.  Blood pressure 109/90.  Weight is 146 pounds.  @IPVITALS @ Physical Exam Vitals reviewed.  HENT:     Head: Normocephalic and atraumatic.     Comments: Head and neck exam shows the craniotomy dressing on the right side of his skull.  This appears to be healing.  He has no facial weakness.  He has no intraoral lesions.  There is no adenopathy on the neck. Eyes:     Pupils: Pupils are equal, round, and reactive to light.  Cardiovascular:     Rate and Rhythm: Normal rate and regular rhythm.     Heart sounds: Normal heart sounds.  Pulmonary:     Effort: Pulmonary effort is normal.     Breath sounds: Normal breath sounds.  Abdominal:     General: Bowel sounds are normal.     Palpations: Abdomen is  soft.     Comments: Abdominal exam is soft.  He has good bowel sounds.  There is no fluid wave.  There is no palpable abdominal mass.  Has no palpable liver or spleen tip.   Musculoskeletal:        General: No tenderness or deformity. Normal range of motion.     Cervical back: Normal range of motion.  Lymphadenopathy:     Cervical: No cervical adenopathy.  Skin:    General: Skin is warm and dry.     Findings: No erythema or Morgan.  Neurological:     Mental Status: He is alert and oriented to person, place, and time.  Psychiatric:        Behavior: Behavior normal.        Thought Content: Thought content normal.        Judgment: Judgment normal.     Recent Labs    02/02/24 1131  WBC 10.2  HGB 12.1*  HCT 37.8*  PLT 485*    Recent Labs    02/02/24 1131  NA 133*  K 4.2  CL 98  CO2 27  GLUCOSE 109*  BUN 16  CREATININE 0.91  CALCIUM  9.8    Blood smear review: None  Pathology: See above  Assessment and Plan: Mr. Matthew Lee is a very nice 68 year old white male.  Looks like he has adenocarcinoma of the GE junction.  He is going to have some further staging studies done.  And we will going to set him up with a PET scan.  We will also see what the endoscopic ultrasound shows.  Maybe, additional biopsies can be obtained.  I think he actually sees Thoracic surgery for the possibility of resection.  It sounds like from what I have seen so far, that this might be a little bit more extensive than thoracic surgery would like.  I suspect we are probably going to have to use some form of neoadjuvant therapy on him.  This might be radiation and chemotherapy.  This might just be chemotherapy.  His performance status is not all that bad.  I was not sure as to how he had the subdural hematoma.  From the MRI of the brain, there is nothing in the brain that look like malignancy.  I had a nice talk with he and his wife.  They are both very very nice.  We will have to see what the PET scan shows.  We will have to see what the endoscopic ultrasound shows.  Will plan to get him back after these are done and then come up with our recommendation for therapy.

## 2024-02-02 NOTE — Telephone Encounter (Signed)
 Inbound call from Dr. Birt Bulla office requesting a call back from Dr. Bridgett Camps regarding patient. States Dr. Maria Shiner saw patient today in office and would like to discuss further. Call back number is [phone]. Please advise, thank you.

## 2024-02-02 NOTE — Progress Notes (Signed)
 [facility] Clinical Social Work  Initial Assessment   Matthew Lee is a 68 y.o. year old male accompanied by spouse. Clinical Social Work was referred by nurse navigator for assessment of psychosocial needs.   SDOH (Social Determinants of Health) assessments performed: Yes SDOH Interventions    Flowsheet Row Office Visit from 02/02/2024 in [facility] [place] [facility] Of [place] Clinical Support from 06/02/2022 in [facility] at [facility] Clinical Support from 05/29/2021 in [facility] Primary [facility] at [facility]  SDOH Interventions     Utilities Interventions -- Intervention Not Indicated --  Physical Activity Interventions -- -- Intervention Not Indicated  [pt states he walks a lot at work & has also set a goal to increase activity]  Health Literacy Interventions Intervention Not Indicated -- --       SDOH Screenings   Food Insecurity: No Food Insecurity (01/25/2024)  Housing: Low Risk  (01/25/2024)  Transportation Needs: No Transportation Needs (01/25/2024)  Utilities: Not At Risk (01/25/2024)  Alcohol Screen: Low Risk  (05/29/2021)  Depression (PHQ2-9): Low Risk  (12/15/2023)  Financial Resource Strain: Low Risk  (12/15/2023)  Physical Activity: Sufficiently Active (12/15/2023)  Social Connections: Socially Isolated (01/25/2024)  Stress: Stress Concern Present (12/15/2023)  Tobacco Use: Medium Risk (02/02/2024)  Health Literacy: Adequate Health Literacy (02/02/2024)     Distress Screen completed: No     No data to display            Family/Social Information:  Housing Arrangement: patient lives with his wife. Family members/support persons in your life? Family Transportation concerns: no  Employment: Out on work excuse  Income source: Supported by [employer] and [employer] Financial concerns: No Type of concern: None Food access concerns: no Religious or spiritual practice: Yes-Patient identifies as  Curator. Advanced directives: No Services Currently in place:  Medicare  Coping/ Adjustment to diagnosis: Patient understands treatment plan and what happens next? 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 Patient enjoys time with family/ friends Current coping skills/ strengths: Capable of independent living , Manufacturing systems engineer , General fund of knowledge , Motivation for treatment/growth , and Supportive family/friends     SUMMARY: Current SDOH Barriers:  None identified by patient.  Clinical Social Work Clinical Goal(s):  Patient will work with SW to address concerns related to adjustment to illness.  Interventions: Discussed common feeling and emotions when being diagnosed with cancer, and the importance of support during treatment Informed patient of the support team roles and support services at [facility] Provided CSW contact information and encouraged patient to call with any questions or concerns Provided patient with information about CSW role and the [employer].  Gave patient's wife a copy of advance directives which he said he was interested in.   Follow Up Plan: CSW will follow-up with patient by phone  Patient verbalizes understanding of plan: Yes    Matthew Peal, LCSW Clinical Social Worker [facility]

## 2024-02-03 ENCOUNTER — Encounter (HOSPITAL_COMMUNITY): Payer: Self-pay | Admitting: Gastroenterology

## 2024-02-03 ENCOUNTER — Encounter: Payer: Self-pay | Admitting: *Deleted

## 2024-02-03 ENCOUNTER — Encounter: Payer: Self-pay | Admitting: Surgery

## 2024-02-03 NOTE — Telephone Encounter (Signed)
 I was able to speak with the pt and discuss EUS instructions. He has also been sent new instructions to My Chart. No questions at this time

## 2024-02-03 NOTE — Progress Notes (Signed)
 In addition to EUS patient needs PET. Scheduled for 02/11/24.  Patient is aware of PET appointment including date, time, and location. The following prep is reviewed with patient and confirmed with teachback: - arrive 30 minutes before appointment time - NPO except water for 6h before scan. No candy, no gum - hold any diabetic medication the morning of the scan - have a low carb dinner the night prior  Radiology Information sheet also mailed to patient's home for reinforcement of education.  Oncology Nurse Navigator Documentation     02/03/2024    8:45 AM  Oncology Nurse Navigator Flowsheets  Navigator Follow Up Date: 02/07/2024  Navigator Follow Up Reason: Other:  Navigator Location [facility]  Navigator Encounter Type Appt/Treatment Plan Review;Telephone  Telephone Outgoing Call  Patient Visit Type MedOnc  Treatment Phase Pre-Tx/Tx Discussion  Barriers/Navigation Needs Coordination of Care;Education  Education Other  Interventions Coordination of Care;Education  Acuity Level 2-Minimal Needs (1-2 Barriers Identified)  Coordination of Care Radiology  Education Method Verbal;Written  Support Groups/Services Friends and Family  Time Spent with Patient 30

## 2024-02-04 ENCOUNTER — Encounter: Admitting: Thoracic Surgery (Cardiothoracic Vascular Surgery)

## 2024-02-04 ENCOUNTER — Ambulatory Visit: Admitting: Family

## 2024-02-04 ENCOUNTER — Encounter: Payer: Self-pay | Admitting: Family

## 2024-02-04 VITALS — BP 104/67 | HR 73 | Temp 98.9°F | Ht 67.0 in | Wt 143.2 lb

## 2024-02-04 DIAGNOSIS — I10 Essential (primary) hypertension: Secondary | ICD-10-CM

## 2024-02-04 DIAGNOSIS — R911 Solitary pulmonary nodule: Secondary | ICD-10-CM

## 2024-02-04 DIAGNOSIS — G4700 Insomnia, unspecified: Secondary | ICD-10-CM

## 2024-02-04 DIAGNOSIS — C160 Malignant neoplasm of cardia: Secondary | ICD-10-CM | POA: Diagnosis not present

## 2024-02-04 DIAGNOSIS — S065XAA Traumatic subdural hemorrhage with loss of consciousness status unknown, initial encounter: Secondary | ICD-10-CM | POA: Diagnosis not present

## 2024-02-04 MED ORDER — LOSARTAN POTASSIUM 50 MG PO TABS: 50.0000 mg | ORAL_TABLET | Freq: Every day | ORAL | Status: DC

## 2024-02-04 MED ORDER — ZOLPIDEM TARTRATE 5 MG PO TABS: 5.0000 mg | ORAL_TABLET | Freq: Every evening | ORAL | 0 refills | Status: DC | PRN

## 2024-02-04 NOTE — Progress Notes (Signed)
 Per Dr. Andy Bannister, this patient is able to undergo EUS with sedation from a neurosurgical standpoint. Call w/ questions or concerns.   Keyosha Tiedt CAYLIN Nataly Pacifico, PA-C

## 2024-02-04 NOTE — Assessment & Plan Note (Signed)
 Workup ongoing with Hematology and GI.  Next step plan is for PET scan.

## 2024-02-04 NOTE — Assessment & Plan Note (Addendum)
 Clinically stable, wound looks good, staples intact. Has upcoming follow up with neurosurgery. He is on keppra  for seizure prophylaxis.

## 2024-02-04 NOTE — Assessment & Plan Note (Addendum)
 Incidental finding 5.1 mm RML, noted on lung cancer screening at [facility]. Rec 6 month follow up CT.

## 2024-02-04 NOTE — Progress Notes (Signed)
 Subjective:     Patient ID: Matthew Lee, male    DOB: 1956/02/16, 68 y.o.   MRN: [id number]  Chief Complaint  Patient presents with   Hospitalization Follow-up    Patient presents today for a hospital follow-up. He was admitted into [facility] 01/25/24-01/29/24 for subdural hematoma.    HPI  Discussed the use of AI scribe software for clinical note transcription with the patient, who gave verbal consent to proceed.  History of Present Illness  Matthew Lee is a 68 year old male presents today for hospital follow up. He is accompanied by his wife. He was admitted 4/29-5/3 for subdural hematoma after experiencing loss of balance, shuffling gait, and left-sided weakness. A craniotomy was performed for drainage. He does not recall specific head trauma but mentioned hitting his head while getting in and out of the car.  He has gastric adenocarcinoma at the gastroesophageal junction, discovered during an outpatient endoscopy following hospital discharge. He has a history of anemia and dark stools prior to admission but recent cbc and ferritin were improved.  He experienced constipation for a couple of weeks and used miralax  and a partial enema recently.   He has difficulty sleeping and uses Benadryl  at night for itching and rest. Requesting refill on his ambien .   Patient was discharged on 4/29.  BP Readings from Last 3 Encounters:  02/04/24 104/67  02/02/24 (!) 109/90  01/29/24 110/65        Health Maintenance Due  Topic Date Due   COVID-19 Vaccine (3 - Moderna risk series) 01/24/2020   DTaP/Tdap/Td (2 - Td or Tdap) 03/16/2023   Medicare Annual Wellness (AWV)  06/03/2023    Past Medical History:  Diagnosis Date   Abnormal weight loss 11/02/2022   Aneurysm of thoracic aorta (HCC) 09/2022   dilated to 4 cm on CT   Cholelithiasis 11/02/2022   Colon cancer screening 07/31/2022   Coronary artery calcification 10/08/2022   Diverticulitis    Dyspnea on exertion 11/11/2022    Elevated PSA 07/31/2022   Essential hypertension 09/26/2009   Qualifier: Diagnosis of   By: Luster Salters MD, Sheilda Deputy 11/11/2022   Gall bladder polyp 02/18/2015   Overview:   - 6 mm (as described from US  abd in 2015). Repeat Us  abd lim ordered.   GERD (gastroesophageal reflux disease) 09/07/2013   Hepatitis C 08/24/2012   TX and cured per pt   History of COVID-19 07/31/2022   History of hepatitis C 11/11/2022   History of tobacco abuse    Hyperglycemia 11/22/2014   Hypertension    long standing since 2007, was treated for sometime, but then discontinued taking meds on his own.   Immunization counseling 04/01/2022   Insomnia 10/31/2009   Qualifier: Diagnosis of   By: Evlyn Hoffmann MD, Amanjot       Mild diastolic dysfunction 08/22/2012   Mixed dyslipidemia 11/11/2022   Preventative health care 12/31/2014   Prominent abdominal aortic pulsation 11/11/2022   Pulmonary edema, acute (HCC)    hospitalized in 09/2009 with hypertensive crisis and acute pulmonary edema- improved with lasix  nad labetalol .    Past Surgical History:  Procedure Laterality Date   CRANIOTOMY Right 01/25/2024   Procedure: CRANIOTOMY HEMATOMA EVACUATION SUBDURAL;  Surgeon: Van Gelinas, MD;  Location: [facility] OR;  Service: Neurosurgery;  Laterality: Right;   ESOPHAGOGASTRODUODENOSCOPY  08/21/2011   Procedure: ESOPHAGOGASTRODUODENOSCOPY (EGD);  Surgeon: Tami Falcon, MD;  Location: [facility];  Service: Endoscopy;  Laterality: N/A;  IR US  LIVER BIOPSY  01/28/2024   TONSILLECTOMY      Family History  Problem Relation Age of Onset   Polycystic kidney disease Father    Polycystic kidney disease Half-Brother    Polycystic kidney disease Half-Brother    Polycystic kidney disease Half-Sister    Colon cancer Neg Hx    Esophageal cancer Neg Hx    Stomach cancer Neg Hx    Pancreatic cancer Neg Hx     Social History   Socioeconomic History   Marital status: Married    Spouse name: Not on file   Number of  children: Not on file   Years of education: Not on file   Highest education level: Some college, no degree  Occupational History   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   Occupation: Garment/textile technologist: [employer]  Tobacco Use   Smoking status: Former    Current packs/day: 0.00    Average packs/day: 1 pack/day for 40.0 years (40.0 ttl pk-yrs)    Types: Cigarettes    Start date: 09/23/1969    Quit date: 09/23/2009    Years since quitting: 14.3   Smokeless tobacco: Never  Vaping Use   Vaping status: Never Used  Substance and Sexual Activity   Alcohol use: Not Currently    Comment: Former drinker.   Drug use: No   Sexual activity: Not Currently  Other Topics Concern   Not on file  Social History Narrative   Lives at home with wife, daughter and her 2 children.   Caffeine use:  1 daily   Works a Aeronautical engineer (Museum/gallery conservator)   Enjoys Optician, dispensing, Civil engineer, contracting.         Social Drivers of Corporate investment banker Strain: Low Risk  (12/15/2023)   Overall Financial Resource Strain (CARDIA)    Difficulty of Paying Living Expenses: Not hard at all  Food Insecurity: No Food Insecurity (01/25/2024)   Hunger Vital Sign    Worried About Running Out of Food in the Last Year: Never true    Ran Out of Food in the Last Year: Never true  Transportation Needs: No Transportation Needs (01/25/2024)   PRAPARE - Administrator, Civil Service (Medical): No    Lack of Transportation (Non-Medical): No  Physical Activity: Sufficiently Active (12/15/2023)   Exercise Vital Sign    Days of Exercise per Week: 5 days    Minutes of Exercise per Session: 40 min  Stress: Stress Concern Present (12/15/2023)   [employer] of Occupational Health - Occupational Stress Questionnaire    Feeling of Stress : To some extent  Social Connections: Socially Isolated (01/25/2024)   Social Connection and Isolation Panel [NHANES]    Frequency of  Communication with Friends and Family: Twice a week    Frequency of Social Gatherings with Friends and Family: Never    Attends Religious Services: Never    Database administrator or Organizations: No    Attends Banker Meetings: Never    Marital Status: Married  Catering manager Violence: Not At Risk (01/25/2024)   Humiliation, Afraid, Rape, and Kick questionnaire    Fear of Current or Ex-Partner: No    Emotionally Abused: No    Physically Abused: No    Sexually Abused: No    Outpatient Medications Prior to Visit  Medication Sig Dispense Refill   amLODipine  (NORVASC ) 10 MG tablet TAKE 1 TABLET DAILY 90 tablet 3  atorvastatin  (LIPITOR) 10 MG tablet Take 1 tablet (10 mg total) by mouth daily. 90 tablet 3   ferrous sulfate  325 (65 FE) MG tablet Take 1 tablet (325 mg total) by mouth every other day.     labetalol  (NORMODYNE ) 100 MG tablet Take 1.5 tablets (150 mg total) by mouth in the morning and at bedtime. 21 tablet 0   levETIRAcetam  (KEPPRA ) 500 MG tablet Take 1 tablet (500 mg total) by mouth 2 (two) times daily. 60 tablet 2   pantoprazole  (PROTONIX ) 40 MG tablet Take 1 tablet (40 mg total) by mouth 2 (two) times daily. 60 tablet 2   polyethylene glycol (MIRALAX  / GLYCOLAX ) 17 g packet Take 17 g by mouth daily as needed for mild constipation. 14 each 0   losartan  (COZAAR ) 50 MG tablet Take 2 tablets (100 mg total) by mouth daily. 90 tablet 1   zolpidem  (AMBIEN ) 5 MG tablet Take 1 tablet (5 mg total) by mouth at bedtime as needed for sleep. 7 tablet 0   No facility-administered medications prior to visit.    No Known Allergies  ROS See HPI    Objective:     Physical Exam Constitutional:      General: He is not in acute distress.    Appearance: He is well-developed.  HENT:     Head: Normocephalic.     Comments: Right sided scalp staples clean/dry/intact Cardiovascular:     Rate and Rhythm: Normal rate and regular rhythm.     Heart sounds: No murmur  heard. Pulmonary:     Effort: Pulmonary effort is normal. No respiratory distress.     Breath sounds: Normal breath sounds. No wheezing or rales.  Skin:    General: Skin is warm and dry.  Neurological:     Mental Status: He is alert and oriented to person, place, and time.  Psychiatric:        Behavior: Behavior normal.        Thought Content: Thought content normal.      BP 104/67   Pulse 73   Temp 98.9 F (37.2 C)   Ht 5\' 7"  (1.702 m)   Wt 143 lb 3.2 oz (65 kg)   SpO2 100%   BMI 22.43 kg/m  Wt Readings from Last 3 Encounters:  02/04/24 143 lb 3.2 oz (65 kg)  02/02/24 146 lb (66.2 kg)  01/27/24 152 lb 5.4 oz (69.1 kg)       Assessment & Plan:   Problem List Items Addressed This Visit       Unprioritized   Subdural hematoma (HCC)   Clinically stable, wound looks good, staples intact. Has upcoming follow up with neurosurgery. He is on keppra  for seizure prophylaxis.       Pulmonary nodule   Incidental finding 5.1 mm RML, noted on lung cancer screening at [facility]. Rec 6 month follow up CT.       Insomnia - Primary   Uncontrolled. Refilled ambien , controlled substance contract.       Relevant Medications   zolpidem  (AMBIEN ) 5 MG tablet   Other Relevant Orders   DRUG MONITORING, PANEL 8 WITH CONFIRMATION, URINE   Hypertension   Appears overtreated. On losartan , amlodipine  and labetalol . Will decrease losartan  from 100mg  to 50mg  daily and pt will keep a close on bp at home.       Relevant Medications   losartan  (COZAAR ) 50 MG tablet   GE junction carcinoma (HCC)   Workup ongoing with Hematology and GI.  Next  step plan is for PET scan.       Other Visit Diagnoses       Essential hypertension       Relevant Medications   losartan  (COZAAR ) 50 MG tablet       I have changed Robbi Childs. Dearman's losartan . I am also having him maintain his amLODipine , labetalol , ferrous sulfate , atorvastatin , pantoprazole , levETIRAcetam , polyethylene glycol, and  zolpidem .  Meds ordered this encounter  Medications   zolpidem  (AMBIEN ) 5 MG tablet    Sig: Take 1 tablet (5 mg total) by mouth at bedtime as needed for sleep.    Dispense:  30 tablet    Refill:  0    Supervising Provider:   Randie Bustle A [[id number]]   losartan  (COZAAR ) 50 MG tablet    Sig: Take 1 tablet (50 mg total) by mouth daily.    Supervising Provider:   Randie Bustle A [phone]

## 2024-02-04 NOTE — Assessment & Plan Note (Signed)
 Uncontrolled. Refilled ambien , controlled substance contract.

## 2024-02-04 NOTE — Assessment & Plan Note (Signed)
 Appears overtreated. On losartan , amlodipine  and labetalol . Will decrease losartan  from 100mg  to 50mg  daily and pt will keep a close on bp at home.

## 2024-02-04 NOTE — Patient Instructions (Signed)
 VISIT SUMMARY:  You had a follow-up visit after your recent hospitalization for a right-sided hematoma and ongoing evaluation for gastric adenocarcinoma. We discussed your current conditions and planned the next steps for your treatment and follow-up care.  YOUR PLAN:  GASTROESOPHAGEAL JUNCTION ADENOCARCINOMA: You have been diagnosed with cancer at the gastroesophageal junction. We are waiting for a PET scan to check if the cancer has spread. -Proceed with the scheduled PET scan. -Follow up with oncology for treatment planning.  ANEMIA DUE TO GASTROINTESTINAL BLEEDING: Your anemia is likely due to bleeding from the cancer at the gastroesophageal junction. Your iron levels have improved. -Continue taking your iron supplements. -We will keep monitoring your hemoglobin and ferritin levels.  GASTROESOPHAGEAL REFLUX DISEASE (GERD): You have chronic acid reflux, which is being managed with medication. -Continue taking your current GERD medication.  RIGHT LUNG NODULE: A small nodule was found in your right lung. We need to check if it is cancerous. -We will evaluate the nodule with the upcoming PET scan. -Repeat imaging in six months if necessary.  RIGHT-SIDED SUBDURAL HEMATOMA: You recently had a subdural hematoma that was treated with surgery. -Follow up with neurosurgery on May 19th. -Contact neurosurgery for dressing change instructions.  INSOMNIA: You have difficulty sleeping and have been using Benadryl . We have prescribed Ambien  to help you sleep better. -Take Ambien  as prescribed for sleep. -You can continue using Benadryl  at night if needed.

## 2024-02-07 ENCOUNTER — Ambulatory Visit (HOSPITAL_COMMUNITY): Admitting: Anesthesiology

## 2024-02-07 ENCOUNTER — Ambulatory Visit (HOSPITAL_COMMUNITY)
Admission: RE | Admit: 2024-02-07 | Discharge: 2024-02-07 | Disposition: A | Discharge status: Home / Self Care | Attending: Gastroenterology | Admitting: Gastroenterology

## 2024-02-07 ENCOUNTER — Encounter (HOSPITAL_COMMUNITY): Payer: Self-pay | Admitting: Gastroenterology

## 2024-02-07 ENCOUNTER — Encounter (HOSPITAL_COMMUNITY)
Admission: RE | Disposition: A | Discharge status: Home / Self Care | Payer: Self-pay | Source: Home / Self Care | Attending: Gastroenterology

## 2024-02-07 ENCOUNTER — Ambulatory Visit (HOSPITAL_BASED_OUTPATIENT_CLINIC_OR_DEPARTMENT_OTHER): Admitting: Anesthesiology

## 2024-02-07 DIAGNOSIS — Z87891 Personal history of nicotine dependence: Secondary | ICD-10-CM | POA: Diagnosis not present

## 2024-02-07 DIAGNOSIS — K2970 Gastritis, unspecified, without bleeding: Secondary | ICD-10-CM | POA: Diagnosis not present

## 2024-02-07 DIAGNOSIS — I739 Peripheral vascular disease, unspecified: Secondary | ICD-10-CM | POA: Diagnosis not present

## 2024-02-07 DIAGNOSIS — I2510 Atherosclerotic heart disease of native coronary artery without angina pectoris: Secondary | ICD-10-CM | POA: Diagnosis not present

## 2024-02-07 DIAGNOSIS — C160 Malignant neoplasm of cardia: Secondary | ICD-10-CM

## 2024-02-07 DIAGNOSIS — Z79899 Other long term (current) drug therapy: Secondary | ICD-10-CM | POA: Diagnosis not present

## 2024-02-07 DIAGNOSIS — Q399 Congenital malformation of esophagus, unspecified: Secondary | ICD-10-CM | POA: Diagnosis not present

## 2024-02-07 DIAGNOSIS — I10 Essential (primary) hypertension: Secondary | ICD-10-CM | POA: Insufficient documentation

## 2024-02-07 DIAGNOSIS — K219 Gastro-esophageal reflux disease without esophagitis: Secondary | ICD-10-CM | POA: Diagnosis not present

## 2024-02-07 DIAGNOSIS — R599 Enlarged lymph nodes, unspecified: Secondary | ICD-10-CM | POA: Diagnosis not present

## 2024-02-07 DIAGNOSIS — C159 Malignant neoplasm of esophagus, unspecified: Secondary | ICD-10-CM | POA: Diagnosis not present

## 2024-02-07 DIAGNOSIS — K3189 Other diseases of stomach and duodenum: Secondary | ICD-10-CM | POA: Diagnosis not present

## 2024-02-07 DIAGNOSIS — I899 Noninfective disorder of lymphatic vessels and lymph nodes, unspecified: Secondary | ICD-10-CM

## 2024-02-07 DIAGNOSIS — K2971 Gastritis, unspecified, with bleeding: Secondary | ICD-10-CM | POA: Diagnosis not present

## 2024-02-07 DIAGNOSIS — K2289 Other specified disease of esophagus: Secondary | ICD-10-CM | POA: Diagnosis not present

## 2024-02-07 DIAGNOSIS — C169 Malignant neoplasm of stomach, unspecified: Secondary | ICD-10-CM

## 2024-02-07 HISTORY — PX: ESOPHAGOGASTRODUODENOSCOPY: SHX5428

## 2024-02-07 HISTORY — PX: EUS: SHX5427

## 2024-02-07 SURGERY — ULTRASOUND, UPPER GI TRACT, ENDOSCOPIC
Anesthesia: Monitor Anesthesia Care

## 2024-02-07 MED ORDER — LIDOCAINE 2% (20 MG/ML) 5 ML SYRINGE
INTRAMUSCULAR | Status: DC | PRN
  Administered 2024-02-07: 40 mg via INTRAVENOUS

## 2024-02-07 MED ORDER — PROPOFOL 1000 MG/100ML IV EMUL
INTRAVENOUS | Status: AC
  Filled 2024-02-07: qty 100

## 2024-02-07 MED ORDER — PROPOFOL 10 MG/ML IV BOLUS
INTRAVENOUS | Status: AC
  Filled 2024-02-07: qty 20

## 2024-02-07 MED ORDER — SODIUM CHLORIDE 0.9 % IV SOLN
INTRAVENOUS | Status: DC
Start: 2024-02-07 — End: 2024-02-07

## 2024-02-07 MED ORDER — ONDANSETRON HCL 4 MG/2ML IJ SOLN
INTRAMUSCULAR | Status: DC | PRN
  Administered 2024-02-07: 4 mg via INTRAVENOUS

## 2024-02-07 MED ORDER — PHENYLEPHRINE 80 MCG/ML (10ML) SYRINGE FOR IV PUSH (FOR BLOOD PRESSURE SUPPORT)
PREFILLED_SYRINGE | INTRAVENOUS | Status: DC | PRN
Start: 2024-02-07 — End: 2024-02-07
  Administered 2024-02-07 (×2): 80 ug via INTRAVENOUS

## 2024-02-07 MED ORDER — PROPOFOL 500 MG/50ML IV EMUL
INTRAVENOUS | Status: DC | PRN
  Administered 2024-02-07: 100 ug/kg/min via INTRAVENOUS
  Administered 2024-02-07: 50 mg via INTRAVENOUS

## 2024-02-07 MED ORDER — PROPOFOL 10 MG/ML IV BOLUS
INTRAVENOUS | Status: DC | PRN
  Administered 2024-02-07: 80 mg via INTRAVENOUS
  Administered 2024-02-07: 20 mg via INTRAVENOUS
  Administered 2024-02-07: 60 mg via INTRAVENOUS
  Administered 2024-02-07: 40 mg via INTRAVENOUS

## 2024-02-07 NOTE — H&P (Signed)
 GASTROENTEROLOGY PROCEDURE H&P NOTE   Primary Care Physician: Dorrene Gaucher, NP  HPI: Matthew Lee is a 68 y.o. male who presents for EGD/EUS for staging of recently diagnosed GE junction/proximal gastric adenocarcinoma.  Past Medical History:  Diagnosis Date   Abnormal weight loss 11/02/2022   Aneurysm of thoracic aorta (HCC) 09/2022   dilated to 4 cm on CT   Cholelithiasis 11/02/2022   Colon cancer screening 07/31/2022   Coronary artery calcification 10/08/2022   Diverticulitis    Dyspnea on exertion 11/11/2022   Elevated PSA 07/31/2022   Essential hypertension 09/26/2009   Qualifier: Diagnosis of   By: Luster Salters MD, Sheilda Deputy 11/11/2022   Gall bladder polyp 02/18/2015   Overview:   - 6 mm (as described from US  abd in 2015). Repeat Us  abd lim ordered.   GERD (gastroesophageal reflux disease) 09/07/2013   Hepatitis C 08/24/2012   TX and cured per pt   History of COVID-19 07/31/2022   History of hepatitis C 11/11/2022   History of tobacco abuse    Hyperglycemia 11/22/2014   Hypertension    long standing since 2007, was treated for sometime, but then discontinued taking meds on his own.   Immunization counseling 04/01/2022   Insomnia 10/31/2009   Qualifier: Diagnosis of   By: Evlyn Hoffmann MD, Amanjot       Mild diastolic dysfunction 08/22/2012   Mixed dyslipidemia 11/11/2022   Preventative health care 12/31/2014   Prominent abdominal aortic pulsation 11/11/2022   Pulmonary edema, acute (HCC)    hospitalized in 09/2009 with hypertensive crisis and acute pulmonary edema- improved with lasix  nad labetalol .   Past Surgical History:  Procedure Laterality Date   CRANIOTOMY Right 01/25/2024   Procedure: CRANIOTOMY HEMATOMA EVACUATION SUBDURAL;  Surgeon: Van Gelinas, MD;  Location: [facility] OR;  Service: Neurosurgery;  Laterality: Right;   ESOPHAGOGASTRODUODENOSCOPY  08/21/2011   Procedure: ESOPHAGOGASTRODUODENOSCOPY (EGD);  Surgeon: Tami Falcon, MD;  Location: [facility]  ENDOSCOPY;  Service: Endoscopy;  Laterality: N/A;   IR US  LIVER BIOPSY  01/28/2024   TONSILLECTOMY     Current Facility-Administered Medications  Medication Dose Route Frequency Provider Last Rate Last Admin   0.9 %  sodium chloride  infusion   Intravenous Continuous Mansouraty, Albino Alu., MD        Current Facility-Administered Medications:    0.9 %  sodium chloride  infusion, , Intravenous, Continuous, Mansouraty, Albino Alu., MD No Known Allergies Family History  Problem Relation Age of Onset   Polycystic kidney disease Father    Polycystic kidney disease Half-Brother    Polycystic kidney disease Half-Brother    Polycystic kidney disease Half-Sister    Colon cancer Neg Hx    Esophageal cancer Neg Hx    Stomach cancer Neg Hx    Pancreatic cancer Neg Hx    Social History   Socioeconomic History   Marital status: Married    Spouse name: Not on file   Number of children: Not on file   Years of education: Not on file   Highest education level: Some college, no degree  Occupational History   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   Occupation: Garment/textile technologist: [employer]  Tobacco Use   Smoking status: Former    Current packs/day: 0.00    Average packs/day: 1 pack/day for 40.0 years (40.0 ttl pk-yrs)    Types: Cigarettes    Start date: 09/23/1969    Quit date: 09/23/2009  Years since quitting: 14.3   Smokeless tobacco: Never  Vaping Use   Vaping status: Never Used  Substance and Sexual Activity   Alcohol use: Not Currently    Comment: Former drinker.   Drug use: No   Sexual activity: Not Currently  Other Topics Concern   Not on file  Social History Narrative   Lives at home with wife, daughter and her 2 children.   Caffeine use:  1 daily   Works a Aeronautical engineer (Museum/gallery conservator)   Enjoys Optician, dispensing, Civil engineer, contracting.         Social Drivers of Corporate investment banker Strain: Low Risk  (12/15/2023)   Overall  Financial Resource Strain (CARDIA)    Difficulty of Paying Living Expenses: Not hard at all  Food Insecurity: No Food Insecurity (01/25/2024)   Hunger Vital Sign    Worried About Running Out of Food in the Last Year: Never true    Ran Out of Food in the Last Year: Never true  Transportation Needs: No Transportation Needs (01/25/2024)   PRAPARE - Administrator, Civil Service (Medical): No    Lack of Transportation (Non-Medical): No  Physical Activity: Sufficiently Active (12/15/2023)   Exercise Vital Sign    Days of Exercise per Week: 5 days    Minutes of Exercise per Session: 40 min  Stress: Stress Concern Present (12/15/2023)   [employer] of Occupational Health - Occupational Stress Questionnaire    Feeling of Stress : To some extent  Social Connections: Socially Isolated (01/25/2024)   Social Connection and Isolation Panel [NHANES]    Frequency of Communication with Friends and Family: Twice a week    Frequency of Social Gatherings with Friends and Family: Never    Attends Religious Services: Never    Database administrator or Organizations: No    Attends Banker Meetings: Never    Marital Status: Married  Catering manager Violence: Not At Risk (01/25/2024)   Humiliation, Afraid, Rape, and Kick questionnaire    Fear of Current or Ex-Partner: No    Emotionally Abused: No    Physically Abused: No    Sexually Abused: No    Physical Exam: Today's Vitals   02/07/24 1348  BP: (!) 154/85  Pulse: 84  Resp: 11  Temp: (!) 97.4 F (36.3 C)  TempSrc: Temporal  SpO2: 95%  Weight: 67.1 kg  Height: 5\' 7"  (1.702 m)  PainSc: 2    Body mass index is 23.18 kg/m. GEN: NAD EYE: Sclerae anicteric ENT: MMM CV: Non-tachycardic GI: Soft, NT/ND NEURO:  Alert & Oriented x 3  Lab Results: No results for input(s): "WBC", "HGB", "HCT", "PLT" in the last 72 hours. BMET No results for input(s): "NA", "K", "CL", "CO2", "GLUCOSE", "BUN", "CREATININE", "CALCIUM " in  the last 72 hours. LFT No results for input(s): "PROT", "ALBUMIN", "AST", "ALT", "ALKPHOS", "BILITOT", "BILIDIR", "IBILI" in the last 72 hours. PT/INR No results for input(s): "LABPROT", "INR" in the last 72 hours.   Impression / Plan: This is a 68 y.o.male who presents for EGD/EUS for staging of recently diagnosed GE junction/proximal gastric adenocarcinoma.  The risks of an EUS including intestinal perforation, bleeding, infection, aspiration, and medication effects were discussed as was the possibility it may not give a definitive diagnosis if a biopsy is performed.  When a biopsy of the pancreas is done as part of the EUS, there is an additional risk of pancreatitis at the rate of about 1-2%.  It was  explained that procedure related pancreatitis is typically mild, although it can be severe and even life threatening, which is why we do not perform random pancreatic biopsies and only biopsy a lesion/area we feel is concerning enough to warrant the risk.   The risks and benefits of endoscopic evaluation/treatment were discussed with the patient and/or family; these include but are not limited to the risk of perforation, infection, bleeding, missed lesions, lack of diagnosis, severe illness requiring hospitalization, as well as anesthesia and sedation related illnesses.  The patient's history has been reviewed, patient examined, no change in status, and deemed stable for procedure.  The patient and/or family is agreeable to proceed.    Yong Henle, MD [facility] Office # [phone]

## 2024-02-07 NOTE — Anesthesia Procedure Notes (Signed)
 Procedure Name: MAC Date/Time: 02/07/2024 3:17 PM  Performed by: Elaina Graver, CRNAPre-anesthesia Checklist: Patient identified, Emergency Drugs available, Suction available, Patient being monitored and Timeout performed Patient Re-evaluated:Patient Re-evaluated prior to induction Oxygen Delivery Method: Nasal cannula Preoxygenation: Pre-oxygenation with 100% oxygen Induction Type: IV induction Placement Confirmation: positive ETCO2 Dental Injury: Teeth and Oropharynx as per pre-operative assessment

## 2024-02-07 NOTE — Transfer of Care (Signed)
 Immediate Anesthesia Transfer of Care Note  Patient: Matthew Lee  Procedure(s) Performed: ULTRASOUND, UPPER GI TRACT, ENDOSCOPIC  Patient Location: PACU GI  Anesthesia Type:MAC  Level of Consciousness: drowsy  Airway & Oxygen Therapy: Patient Spontanous Breathing and Patient connected to nasal cannula oxygen  Post-op Assessment: Report given to RN and Post -op Vital signs reviewed and stable  Post vital signs: Reviewed and stable  Last Vitals:  Vitals Value Taken Time  BP 101/65 02/07/24 1611  Temp    Pulse 69 02/07/24 1614  Resp 18 02/07/24 1614  SpO2 100 % 02/07/24 1614  Vitals shown include unfiled device data.  Last Pain:  Vitals:   02/07/24 1608  TempSrc:   PainSc: Asleep         Complications: No notable events documented.

## 2024-02-07 NOTE — Anesthesia Postprocedure Evaluation (Signed)
 Anesthesia Post Note  Patient: Matthew Lee  Procedure(s) Performed: ULTRASOUND, UPPER GI TRACT, ENDOSCOPIC     Patient location during evaluation: PACU Anesthesia Type: MAC Level of consciousness: awake and alert and oriented Pain management: pain level controlled Vital Signs Assessment: post-procedure vital signs reviewed and stable Respiratory status: spontaneous breathing, nonlabored ventilation and respiratory function stable Cardiovascular status: stable and blood pressure returned to baseline Postop Assessment: no apparent nausea or vomiting Anesthetic complications: no   No notable events documented.  Last Vitals:  Vitals:   02/07/24 1348 02/07/24 1608  BP: (!) 154/85 (!) 106/59  Pulse: 84 80  Resp: 11 (!) 23  Temp: (!) 36.3 C   SpO2: 95% 95%    Last Pain:  Vitals:   02/07/24 1608  TempSrc:   PainSc: Asleep                 Loui Massenburg A.

## 2024-02-07 NOTE — Anesthesia Preprocedure Evaluation (Addendum)
 Anesthesia Evaluation  Patient identified by MRN, date of birth, ID band Patient awake    Reviewed: Allergy & Precautions, H&P , NPO status , Patient's Chart, lab work & pertinent test results, reviewed documented beta blocker date and time   Airway Mallampati: III  TM Distance: >3 FB Neck ROM: Full    Dental  (+) Edentulous Lower, Edentulous Upper   Pulmonary former smoker Former smoker, quit 2010, 45 pack year history    Pulmonary exam normal breath sounds clear to auscultation       Cardiovascular hypertension (154/85 preop), Pt. on medications and Pt. on home beta blockers + CAD (coronary artery calcification) and + Peripheral Vascular Disease (thoracic aorta 4 cm)  Normal cardiovascular exam Rhythm:Regular Rate:Normal     Neuro/Psych H/o SDH: keppra  negative neurological ROS  negative psych ROS   GI/Hepatic negative GI ROS,GERD  Controlled and Medicated,,(+) Hepatitis -, Cadenocarcinoma of the GE junction   Endo/Other  negative endocrine ROS    Renal/GU negative Renal ROS  negative genitourinary   Musculoskeletal negative musculoskeletal ROS (+)    Abdominal   Peds negative pediatric ROS (+)  Hematology negative hematology ROS (+) Hb 12.1, plt 485k   Anesthesia Other Findings   Reproductive/Obstetrics negative OB ROS                             Anesthesia Physical Anesthesia Plan  ASA: 3  Anesthesia Plan: MAC   Post-op Pain Management:    Induction:   PONV Risk Score and Plan: 2 and Propofol  infusion and TIVA  Airway Management Planned: Natural Airway and Simple Face Mask  Additional Equipment: None  Intra-op Plan:   Post-operative Plan:   Informed Consent: I have reviewed the patients History and Physical, chart, labs and discussed the procedure including the risks, benefits and alternatives for the proposed anesthesia with the patient or authorized representative  who has indicated his/her understanding and acceptance.       Plan Discussed with: CRNA  Anesthesia Plan Comments:        Anesthesia Quick Evaluation

## 2024-02-07 NOTE — Discharge Instructions (Signed)

## 2024-02-07 NOTE — Op Note (Signed)
 [facility] Patient Name: Matthew Lee Procedure Date: 02/07/2024 MRN: [id number] Attending MD: Yong Henle , MD, [id number] Date of Birth: 11-20-1955 CSN: [id number] Age: 68 Admit Type: Ambulatory Procedure:                Upper EUS Indications:              Lymphadenopathy on CT scan, Pre-treatment staging                            of esophageal adenocarcinoma Providers:                Yong Henle, MD, Bradley Caffey, Gabino Joe, Technician Referring MD:             Yong Henle, MD Medicines:                Monitored Anesthesia Care Complications:            No immediate complications. Estimated Blood Loss:     Estimated blood loss was minimal. Procedure:                Pre-Anesthesia Assessment:                           - Prior to the procedure, a History and Physical                            was performed, and patient medications and                            allergies were reviewed. The patient's tolerance of                            previous anesthesia was also reviewed. The risks                            and benefits of the procedure and the sedation                            options and risks were discussed with the patient.                            All questions were answered, and informed consent                            was obtained. Prior Anticoagulants: The patient has                            taken no anticoagulant or antiplatelet agents. ASA                            Grade Assessment: III - A patient with severe  systemic disease. After reviewing the risks and                            benefits, the patient was deemed in satisfactory                            condition to undergo the procedure.                           After obtaining informed consent, the endoscope was                            passed under direct vision. Throughout the                             procedure, the patient's blood pressure, pulse, and                            oxygen saturations were monitored continuously. The                            [id number]-[id number] ([id number]) Olympus endoscope was introduced                            through the mouth, and advanced to the second part                            of duodenum. The GF-[id number]-[id number] ([id number]) Olympus                            radial ultrasound scope was introduced through the                            mouth, and advanced to the stomach for ultrasound                            examination from the esophagus and stomach. The                            [id number] ([id number]) Olympus linear ultrasound scope                            was introduced through the mouth, and advanced to                            the stomach for ultrasound examination from the                            esophagus and stomach. The upper EUS was                            accomplished without difficulty. The patient  tolerated the procedure. Scope In: Scope Out: Findings:      ENDOSCOPIC FINDING: :      No gross lesions were noted in the proximal esophagus and in the mid       esophagus.      The distal esophagus was significantly tortuous.      A large, frond-like/villous, fungating, infiltrative and ulcerated,       circumferential mass with oozing bleeding and stigmata of recent       bleeding was found at the gastroesophageal junction and in the cardia       (41 to 48 cm from the incisors) with the majority of the mass being       within the stomach. This causes stenosis with slight tension on our 10       mm adult endoscope, but inability to traverse the area with radial       echoendoscope and only with forward-viewing capabilities of the forward       view EUS scope (linear echoendoscope could not traverse this area).      No other gross lesions were noted in the entire examined stomach. Due to       the  history of recent diagnosis of gastric cancer, biopsies were taken       with a cold forceps for histology and Helicobacter pylori testing.      No gross lesions were noted in the duodenal bulb, in the first portion       of the duodenum and in the second portion of the duodenum.      ENDOSONOGRAPHIC FINDING: :      A hypoechoic irregular mass was identified endosonographically in the       cardia of the stomach extending slightly proximal into the       gastroesophageal junction (completely ablated tissue planes). The mass       begins very near and potentially just proximal to the gastroesophageal       junction and extends with its epicenter in the gastric cardia (4 cm from       the presumed GE junction, 41 to 49 cm from incisors) and it measured 38       mm by 27 mm in maximal cross-sectional diameter. The outer margins were       irregular. There was sonographic evidence suggesting invasion into and       through the muscularis propria (Layer 4) into the serosa (Layer 5). I       could not pass the radial echoendoscope through this area. I transition       to the forward-viewing order EUS scope and with significant flexion of       the echoendoscope was able to traverse the area into the stomach (linear       echoendoscope could not traverse this area).      Localized wall thickening was visualized endosonographically in the       gastroesophageal junction. This appeared to be primarily due to       thickening of the muscularis propria (Layer 4).      There was no sign of significant endosonographic abnormality in the       thoracic esophagus. No masses and no wall thickening were identified.      Multiple malignant-appearing lymph nodes were visualized in the       paracardial region (level 16), left gastric region (level 17) and celiac       region (level  20). The largest measured 10 mm by 8 mm in maximal       cross-sectional diameter. The nodes were round, hypoechoic and had well        defined margins. Due to the significant stenosis, ability to proceed       with passing linear echoendoscope through the stenosis was not possible       this could not perform aspiration/biopsy of lymph nodes.      Endosonographic imaging in the visualized portion of the liver showed no       mass.      The celiac region was visualized. Impression:               EGD impression:                           - No gross lesions in the proximal esophagus and in                            the mid esophagus.                           - Tortuous distal esophagus.                           - Malignant gastric tumor just above the                            gastroesophageal junction and extending into the                            cardia (epicenter within the stomach approximately                            41 to 49 cm from incisors). This causes stenosis.                           - No other gross lesions in the entire stomach.                            Biopsied for HP evaluation in setting of recent                            gastric cancer diagnosis.                           - No gross lesions in the duodenal bulb, in the                            first portion of the duodenum and in the second                            portion of the duodenum.                           EUS impression:                           -  A mass was found at the GE junction with                            epicenter within the cardia of the stomach. A                            tissue diagnosis was obtained prior to this exam.                            This is consistent with adenocarcinoma. This was                            staged T3 N1 Mx by endosonographic criteria.                           - Wall thickening was seen in the gastroesophageal                            junction. The thickening appeared to primarily be                            within the muscularis propria (Layer 4).                            - There was no sign of significant pathology in the                            thoracic esophagus.                           - Multiple malignant-appearing lymph nodes were                            visualized in the paracardial region (level 16),                            left gastric region (level 17) and celiac region                            (level 20). Tissue has not been obtained. However,                            the endosonographic appearance is highly suspicious                            for metastatic gastric adenocarcinoma.                            Unfortunately, as documented/commented above, the                            linear echoendoscope for sampling purposes was not  able to be accessible for attempt at lymph node                            sampling and at times would have required a needle                            to go through the gastric cancer. Moderate Sedation:      Not Applicable - Patient had care per Anesthesia. Recommendation:           - The patient will be observed post-procedure,                            until all discharge criteria are met.                           - Discharge patient to home.                           - Patient has a contact number available for                            emergencies. The signs and symptoms of potential                            delayed complications were discussed with the                            patient. Return to normal activities tomorrow.                            Written discharge instructions were provided to the                            patient.                           - Resume previous diet.                           - Await path results.                           - Observe patient's clinical course.                           - Proceed with PET/CT that is already scheduled.                           - Followup with Surgery and Oncology and Radiation                             Oncology in regards to next steps of treatment                            options.                           -  The findings and recommendations were discussed                            with the patient.                           - The findings and recommendations were discussed                            with the patient's family. Procedure Code(s):        --- Professional ---                           [phone], Esophagogastroduodenoscopy, flexible,                            transoral; with endoscopic ultrasound examination                            limited to the esophagus, stomach or duodenum, and                            adjacent structures                           43239, Esophagogastroduodenoscopy, flexible,                            transoral; with biopsy, single or multiple Diagnosis Code(s):        --- Professional ---                           Q39.9, Congenital malformation of esophagus,                            unspecified                           C16.0, Malignant neoplasm of cardia                           K31.89, Other diseases of stomach and duodenum                           K22.89, Other specified disease of esophagus                           I89.9, Noninfective disorder of lymphatic vessels                            and lymph nodes, unspecified                           R59.1, Generalized enlarged lymph nodes                           C15.9, Malignant neoplasm of esophagus, unspecified CPT copyright 2022 American Medical Association. All rights  reserved. The codes documented in this report are preliminary and upon coder review may  be revised to meet current compliance requirements. Yong Henle, MD 02/07/2024 4:16:56 PM Number of Addenda: 0

## 2024-02-08 ENCOUNTER — Encounter (HOSPITAL_COMMUNITY): Payer: Self-pay | Admitting: Gastroenterology

## 2024-02-08 ENCOUNTER — Other Ambulatory Visit: Payer: Self-pay

## 2024-02-08 ENCOUNTER — Encounter: Payer: Self-pay | Admitting: *Deleted

## 2024-02-08 ENCOUNTER — Ambulatory Visit: Admitting: Speech Pathology

## 2024-02-08 DIAGNOSIS — C160 Malignant neoplasm of cardia: Secondary | ICD-10-CM

## 2024-02-08 DIAGNOSIS — R41841 Cognitive communication deficit: Secondary | ICD-10-CM | POA: Diagnosis not present

## 2024-02-08 NOTE — Progress Notes (Signed)
 Patient had his EUS yesterday and it shows a presumed T3N1Mx disease. He has his PET scan this Friday.   Dr Maria Shiner would like him to come in this week for chemo discussion. Scheduled for 02/09/2024.  Oncology Nurse Navigator Documentation     02/08/2024    8:00 AM  Oncology Nurse Navigator Flowsheets  Navigator Follow Up Date: 02/09/2024  Navigator Follow Up Reason: Follow-up Appointment  Navigator Location [facility]  Navigator Encounter Type Scan Review  Patient Visit Type MedOnc  Treatment Phase Pre-Tx/Tx Discussion  Barriers/Navigation Needs Coordination of Care;Education  Interventions Coordination of Care  Acuity Level 2-Minimal Needs (1-2 Barriers Identified)  Coordination of Care Appts  Support Groups/Services Friends and Family  Time Spent with Patient 15

## 2024-02-08 NOTE — Therapy (Unsigned)
 OUTPATIENT SPEECH LANGUAGE PATHOLOGY EVALUATION   Patient Name: Matthew Lee MRN: [id number] DOB:Feb 21, 1956, 68 y.o., male Today's Date: 02/08/2024  PCP: Dorrene Gaucher, NP REFERRING PROVIDER: Star East, PA-C  END OF SESSION:  End of Session - 02/08/24 1021     Visit Number 1    SLP Start Time 1016    SLP Stop Time  1100    SLP Time Calculation (min) 44 min    Activity Tolerance Patient tolerated treatment well             Past Medical History:  Diagnosis Date   Abnormal weight loss 11/02/2022   Aneurysm of thoracic aorta (HCC) 09/2022   dilated to 4 cm on CT   Cholelithiasis 11/02/2022   Colon cancer screening 07/31/2022   Coronary artery calcification 10/08/2022   Diverticulitis    Dyspnea on exertion 11/11/2022   Elevated PSA 07/31/2022   Essential hypertension 09/26/2009   Qualifier: Diagnosis of   By: Luster Salters MD, Sheilda Deputy 11/11/2022   Gall bladder polyp 02/18/2015   Overview:   - 6 mm (as described from US  abd in 2015). Repeat Us  abd lim ordered.   GERD (gastroesophageal reflux disease) 09/07/2013   Hepatitis C 08/24/2012   TX and cured per pt   History of COVID-19 07/31/2022   History of hepatitis C 11/11/2022   History of tobacco abuse    Hyperglycemia 11/22/2014   Hypertension    long standing since 2007, was treated for sometime, but then discontinued taking meds on his own.   Immunization counseling 04/01/2022   Insomnia 10/31/2009   Qualifier: Diagnosis of   By: Evlyn Hoffmann MD, Amanjot       Mild diastolic dysfunction 08/22/2012   Mixed dyslipidemia 11/11/2022   Preventative health care 12/31/2014   Prominent abdominal aortic pulsation 11/11/2022   Pulmonary edema, acute (HCC)    hospitalized in 09/2009 with hypertensive crisis and acute pulmonary edema- improved with lasix  nad labetalol .   Past Surgical History:  Procedure Laterality Date   CRANIOTOMY Right 01/25/2024   Procedure: CRANIOTOMY HEMATOMA EVACUATION SUBDURAL;   Surgeon: Van Gelinas, MD;  Location: [facility] OR;  Service: Neurosurgery;  Laterality: Right;   ESOPHAGOGASTRODUODENOSCOPY  08/21/2011   Procedure: ESOPHAGOGASTRODUODENOSCOPY (EGD);  Surgeon: Tami Falcon, MD;  Location: [facility] ENDOSCOPY;  Service: Endoscopy;  Laterality: N/A;   ESOPHAGOGASTRODUODENOSCOPY N/A 02/07/2024   Procedure: EGD (ESOPHAGOGASTRODUODENOSCOPY);  Surgeon: Normie Becton., MD;  Location: [facility];  Service: Gastroenterology;  Laterality: N/A;   EUS N/A 02/07/2024   Procedure: ULTRASOUND, UPPER GI TRACT, ENDOSCOPIC;  Surgeon: Brice Campi Albino Alu., MD;  Location: [facility]COPY;  Service: Gastroenterology;  Laterality: N/A;   IR US  LIVER BIOPSY  01/28/2024   TONSILLECTOMY     Patient Active Problem List   Diagnosis Date Noted   Gastric adenocarcinoma (HCC) 02/07/2024   Enlarged lymph nodes 02/07/2024   GE junction carcinoma (HCC) 02/04/2024   Pulmonary nodule 02/04/2024   Subdural hematoma (HCC) 01/25/2024   Iron deficiency anemia 12/18/2023   Ex-smoker 11/11/2022   History of hepatitis C 11/11/2022   Mixed dyslipidemia 11/11/2022   Dyspnea on exertion 11/11/2022   Prominent abdominal aortic pulsation 11/11/2022   Diverticulitis 11/02/2022   Pulmonary edema, acute (HCC) 11/02/2022   Abnormal weight loss 11/02/2022   Cholelithiasis 11/02/2022   Coronary artery calcification 10/08/2022   Aneurysm of thoracic aorta (HCC) 09/2022   Elevated PSA 07/31/2022   History of COVID-19 07/31/2022   Colon cancer screening  07/31/2022   Immunization counseling 04/01/2022   Gall bladder polyp 02/18/2015   Preventative health care 12/31/2014   Hyperglycemia 11/22/2014   GERD (gastroesophageal reflux disease) 09/07/2013   Hepatitis C 08/24/2012   Mild diastolic dysfunction 08/22/2012   Insomnia 10/31/2009   History of tobacco abuse 09/26/2009   Hypertension 09/26/2009    ONSET DATE: 01/25/24   REFERRING DIAG: U04.5XAA (ICD-10-CM) - Subdural hematoma (HCC)  THERAPY  DIAG:  Cognitive communication deficit  Rationale for Evaluation and Treatment: Rehabilitation  SUBJECTIVE:   SUBJECTIVE STATEMENT: Pt was pleasant and cooperative throughout evaluation.   Pt accompanied by: self  PERTINENT HISTORY: Per chart review:  PMHx significant for HTN, HLD, CAD, HFpEF, thoracic aortic aneurysm, chronic HCV s/p treatment, IDA, SDH  PAIN:  Are you having pain? No  FALLS: Has patient fallen in last 6 months?  Yes; 1 (after this hospital stay)   LIVING ENVIRONMENT: Lives with: lives with their spouse and and two grandsons [name])  Lives in: House/apartment  PLOF:  Level of assistance: Independent with ADLs, Independent with IADLs Employment: Full-time employment - goal is to return to [employer] employment   PATIENT GOALS: memory, attention/focus   OBJECTIVE:  Note: Objective measures were completed at Evaluation unless otherwise noted.  DIAGNOSTIC FINDINGS: Per chart review:   CT HEAD WO CONTRAST 01/25/2024  IMPRESSION: 1. Large and mostly isodense Right Subdural Hematoma, estimated up to 23 mm thickness, and probably septated. 2. Intracranial mass effect with leftward midline shift of 9 mm. 3. No skull fracture or other acute intracranial abnormality identified.   Electronically Signed: By: Marlise Simpers M.D. On: 01/25/2024 12:55  COGNITION: Overall cognitive status: Impaired Areas of impairment:  Attention: Impaired: Selective, Alternating, Divided Memory: Impaired: Short term Awareness: Impaired: Emergent Executive function: Impaired: Problem solving, Organization, Planning, Error awareness, and Self-correction Functional deficits: Pt and wife had difficulty identifying problems, though report he has some difficulty remembering information. Pt reports he has trouble "focusing".   COGNITIVE COMMUNICATION: Following directions: Follows multi-step commands inconsistently  Auditory comprehension: Impaired: Likely 2/2 to attention Verbal expression:  Impaired: Notes some difficulty with expressing thoughts  Functional communication: Impaired: Communicating needs/wants effectively, decreased ability to recall details from conversations   ORAL MOTOR EXAMINATION: Overall status: Did not assess Comments: NA  STANDARDIZED ASSESSMENTS: Initiated CLQT - to complete next session    Cognitive Linguistic Quick Test: AGE - 18 - 69   The Cognitive Linguistic Quick Test (CLQT) was administered to assess the relative status of five cognitive domains: attention, memory, language, executive functioning, and visuospatial skills. Scores from 10 tasks were used to estimate severity ratings (standardized for age groups 18-69 years and 70-89 years) for each domain, a clock drawing task, as well as an overall composite severity rating of cognition.       Task Score Criterion Cut Scores  Personal Facts 8/8 8  Symbol Cancellation 11/12 11  Confrontation Naming -/10 10  Clock Drawing  5/13 12  Story Retelling 8/10 6  Symbol Trails 2/10 9  Generative Naming 3/9 5  Design Memory -/6 5  Mazes  -/8 7  Design Generation -/13 6       PATIENT REPORTED OUTCOME MEASURES (PROM): To complete next session  TREATMENT DATE:     PATIENT EDUCATION: Education details: SLP role in cognitive-communication Person educated: Patient and Spouse Education method: Explanation Education comprehension: verbalized understanding and needs further education   GOALS: Goals reviewed with patient? Yes  SHORT TERM GOALS: Target date: 03/11/24  Pt will complete CLQT and goals will be updated Baseline: Goal status: INITIAL  2.  Pt will complete Cognitive PROM Baseline:  Goal status: INITIAL  3.   Baseline:  Goal status: INITIAL  4.   Baseline:  Goal status: INITIAL  5.   Baseline:  Goal status: INITIAL  6.   Baseline:  Goal  status: INITIAL  LONG TERM GOALS: Target date: 04/10/24  Pt will improve score on PROMS Baseline:  Goal status: INITIAL  2.   Baseline:  Goal status: INITIAL  3.   Baseline:  Goal status: INITIAL  4.   Baseline:  Goal status: INITIAL  5.   Baseline:  Goal status: INITIAL  6.   Baseline:  Goal status: INITIAL  ASSESSMENT:  CLINICAL IMPRESSION: Pt is a 68 yo male who presents to [facility] for evaluation post SDH. Pt was accompanied by his wife, [name], to today's assessment. Pt and wife endorse changes in attention and memory - both pt and wife reported they felt he was 75% back to baseline. Pt also reports some sound sensitivity to louder music. Pt was assessed using CLQT - to complete next session. SLP observed pt requiring repetition of directions and difficulty following multi-step directions, decreased attention, impaired topic maintenance, slowed processing/ability to retrieve words in structured task. SLP rec skilled ST services to address cognitive-communication impairment, as pt reports he would like to return to work.    OBJECTIVE IMPAIRMENTS: include attention, memory, awareness, and executive functioning. These impairments are limiting patient from return to work, managing medications, managing appointments, managing finances, household responsibilities, ADLs/IADLs, and effectively communicating at home and in community. Factors affecting potential to achieve goals and functional outcome are NA.Aaron Aas Patient will benefit from skilled SLP services to address above impairments and improve overall function.  REHAB POTENTIAL: Good  PLAN:  SLP FREQUENCY: 1-2x/week  SLP DURATION: 8 weeks  PLANNED INTERVENTIONS: Environmental controls, Cueing hierachy, Internal/external aids, Functional tasks, SLP instruction and feedback, Compensatory strategies, Patient/family education, and [id number] Treatment of speech (30 or 45 min)     [employer], CCC-SLP 02/08/2024, 10:22  AM

## 2024-02-09 ENCOUNTER — Inpatient Hospital Stay (HOSPITAL_BASED_OUTPATIENT_CLINIC_OR_DEPARTMENT_OTHER): Admitting: Hematology & Oncology

## 2024-02-09 ENCOUNTER — Encounter: Payer: Self-pay | Admitting: *Deleted

## 2024-02-09 ENCOUNTER — Inpatient Hospital Stay

## 2024-02-09 ENCOUNTER — Encounter: Payer: Self-pay | Admitting: Hematology & Oncology

## 2024-02-09 VITALS — BP 90/61 | HR 76 | Temp 97.9°F | Resp 20 | Ht 67.0 in | Wt 149.0 lb

## 2024-02-09 DIAGNOSIS — K219 Gastro-esophageal reflux disease without esophagitis: Secondary | ICD-10-CM | POA: Diagnosis not present

## 2024-02-09 DIAGNOSIS — I2510 Atherosclerotic heart disease of native coronary artery without angina pectoris: Secondary | ICD-10-CM | POA: Diagnosis not present

## 2024-02-09 DIAGNOSIS — Z5189 Encounter for other specified aftercare: Secondary | ICD-10-CM | POA: Diagnosis not present

## 2024-02-09 DIAGNOSIS — S065XAA Traumatic subdural hemorrhage with loss of consciousness status unknown, initial encounter: Secondary | ICD-10-CM | POA: Diagnosis not present

## 2024-02-09 DIAGNOSIS — C160 Malignant neoplasm of cardia: Secondary | ICD-10-CM

## 2024-02-09 DIAGNOSIS — Z8616 Personal history of COVID-19: Secondary | ICD-10-CM | POA: Diagnosis not present

## 2024-02-09 DIAGNOSIS — I10 Essential (primary) hypertension: Secondary | ICD-10-CM | POA: Diagnosis not present

## 2024-02-09 DIAGNOSIS — Z5111 Encounter for antineoplastic chemotherapy: Secondary | ICD-10-CM | POA: Diagnosis not present

## 2024-02-09 DIAGNOSIS — E782 Mixed hyperlipidemia: Secondary | ICD-10-CM | POA: Diagnosis not present

## 2024-02-09 DIAGNOSIS — Z79899 Other long term (current) drug therapy: Secondary | ICD-10-CM | POA: Diagnosis not present

## 2024-02-09 DIAGNOSIS — C169 Malignant neoplasm of stomach, unspecified: Secondary | ICD-10-CM

## 2024-02-09 DIAGNOSIS — R1310 Dysphagia, unspecified: Secondary | ICD-10-CM | POA: Diagnosis not present

## 2024-02-09 DIAGNOSIS — Z87891 Personal history of nicotine dependence: Secondary | ICD-10-CM | POA: Diagnosis not present

## 2024-02-09 DIAGNOSIS — G4700 Insomnia, unspecified: Secondary | ICD-10-CM | POA: Diagnosis not present

## 2024-02-09 LAB — CBC WITH DIFFERENTIAL (CANCER CENTER ONLY)
Abs Immature Granulocytes: 0.03 10*3/uL (ref 0.00–0.07)
Basophils Absolute: 0.1 10*3/uL (ref 0.0–0.1)
Basophils Relative: 1 %
Eosinophils Absolute: 0.2 10*3/uL (ref 0.0–0.5)
Eosinophils Relative: 2 %
HCT: 37.3 % — ABNORMAL LOW (ref 39.0–52.0)
Hemoglobin: 11.8 g/dL — ABNORMAL LOW (ref 13.0–17.0)
Immature Granulocytes: 0 %
Lymphocytes Relative: 21 %
Lymphs Abs: 2.1 10*3/uL (ref 0.7–4.0)
MCH: 24.5 pg — ABNORMAL LOW (ref 26.0–34.0)
MCHC: 31.6 g/dL (ref 30.0–36.0)
MCV: 77.4 fL — ABNORMAL LOW (ref 80.0–100.0)
Monocytes Absolute: 1.2 10*3/uL — ABNORMAL HIGH (ref 0.1–1.0)
Monocytes Relative: 12 %
Neutro Abs: 6.2 10*3/uL (ref 1.7–7.7)
Neutrophils Relative %: 64 %
Platelet Count: 537 10*3/uL — ABNORMAL HIGH (ref 150–400)
RBC: 4.82 MIL/uL (ref 4.22–5.81)
RDW: 14.4 % (ref 11.5–15.5)
WBC Count: 9.6 10*3/uL (ref 4.0–10.5)
nRBC: 0 % (ref 0.0–0.2)

## 2024-02-09 LAB — FERRITIN: Ferritin: 32 ng/mL (ref 24–336)

## 2024-02-09 LAB — CMP (CANCER CENTER ONLY)
ALT: 11 U/L (ref 0–44)
AST: 13 U/L — ABNORMAL LOW (ref 15–41)
Albumin: 4.1 g/dL (ref 3.5–5.0)
Alkaline Phosphatase: 150 U/L — ABNORMAL HIGH (ref 38–126)
Anion gap: 7 (ref 5–15)
BUN: 20 mg/dL (ref 8–23)
CO2: 27 mmol/L (ref 22–32)
Calcium: 9.6 mg/dL (ref 8.9–10.3)
Chloride: 99 mmol/L (ref 98–111)
Creatinine: 0.91 mg/dL (ref 0.61–1.24)
GFR, Estimated: >60 mL/min (ref >60)
Glucose, Bld: 109 mg/dL — ABNORMAL HIGH (ref 70–99)
Potassium: 4.5 mmol/L (ref 3.5–5.1)
Sodium: 133 mmol/L — ABNORMAL LOW (ref 135–145)
Total Bilirubin: 0.4 mg/dL (ref 0.0–1.2)
Total Protein: 7.2 g/dL (ref 6.5–8.1)

## 2024-02-09 LAB — SURGICAL PATHOLOGY

## 2024-02-09 LAB — PREALBUMIN: Prealbumin: 19 mg/dL (ref 18–38)

## 2024-02-09 LAB — IRON AND IRON BINDING CAPACITY (CC-WL,HP ONLY)
Iron: 27 ug/dL — ABNORMAL LOW (ref 45–182)
Saturation Ratios: 6 % — ABNORMAL LOW (ref 17.9–39.5)
TIBC: 440 ug/dL (ref 250–450)
UIBC: 413 ug/dL — ABNORMAL HIGH (ref 117–376)

## 2024-02-09 LAB — LACTATE DEHYDROGENASE: LDH: 131 U/L (ref 98–192)

## 2024-02-09 NOTE — Progress Notes (Signed)
START ON PATHWAY REGIMEN - Gastroesophageal     A cycle is every 14 days:     Docetaxel      Oxaliplatin      Leucovorin      Fluorouracil   **Always confirm dose/schedule in your pharmacy ordering system**  Patient Characteristics: Gastric, Adenocarcinoma, Preoperative or Nonsurgical Candidate, M0 (Clinical Staging), cT3 or Higher or cN+, Surgical Candidate (Up to cT4a), MSS/pMMR or MSI Unknown Therapeutic Status: Preoperative or Nonsurgical Candidate, M0 (Clinical Staging) Histology: Adenocarcinoma Disease Classification: Gastric AJCC N Category: cN1 AJCC M Category: cM0 AJCC 8 Stage Grouping: III AJCC T Category: cT3 Microsatellite/Mismatch Repair Status: Unknown Intent of Therapy: Curative Intent, Discussed with Patient

## 2024-02-09 NOTE — Progress Notes (Signed)
 Path from EUS resulted this morning showing gastritis. Negative for any malignancy. Reviewed with Dr Maria Shiner. Patient is coming in today to discuss neoadjuvant chemo. Plan for future surgical specimen to be sent for [facility] studies.   Patient came in today for chemo discussion. Plan is to start neoadjuvant FLOT next week. He will a port, and chemo education.   Spoke to him today about scheduling appointments but he wants me to call tomorrow when he can focus and have a calendar in front of him. Will call tomorrow.   Oncology Nurse Navigator Documentation     02/09/2024   11:00 AM  Oncology Nurse Navigator Flowsheets  Navigator Follow Up Date: 02/10/2024  Navigator Follow Up Reason: Patient Call  Navigator Location [facility]  Navigator Encounter Type Follow-up Appt  Patient Visit Type MedOnc  Treatment Phase Pre-Tx/Tx Discussion  Barriers/Navigation Needs Coordination of Care;Education  Interventions Education;Psycho-Social Support  Acuity Level 2-Minimal Needs (1-2 Barriers Identified)  Education Method Verbal  Support Groups/Services Friends and Family  Time Spent with Patient 30

## 2024-02-09 NOTE — Progress Notes (Signed)
 Hematology and Oncology Follow Up Visit  Matthew Lee [id number] Dec 15, 1955 68 y.o. 02/09/2024   Principle Diagnosis:  Locally advanced adenocarcinoma of the stomach  Current Therapy:   Neoadjuvant FLOT-start on 02/17/2024     Interim History:  Matthew Lee is back for follow-up.  He has insomnia for the first time on May 7.  At that time, he was evaluated by other physicians.  He had another upper endoscopy done.  Unfortunately biopsies were unremarkable.  There was no malignancy.  However, it is felt that this is a malignant process.  It was also determined that this was a gastric cancer.  It invaded up into the GE junction.  He is due for a PET scan on 02/11/2024.  He is doing okay.  Is not lost weight.  Is eating okay.  He is having no dysphagia or odynophagia.  He has had no bleeding.  There has been no change in bowel or bladder habits.  He has had no cough or shortness of breath.  He has had no headache.  He is still recovering from the subdural hematoma that he sustained after falling.  He still gets some physical therapy twice a week.  In talking to the other physicians, it was felt that he would be a good candidate for neoadjuvant chemotherapy.  As such, I talked him about FLOT.  I think this would be a reasonable approach for him.  I think he could tolerate this.  We probably will have to make a little bit of a dosage adjustment.  He has had no fever.  He has had no rashes.  There has been no swollen lymph nodes.  Overall, I would say that his performance status is probably ECOG 1.    Medications:  Current Outpatient Medications:    amLODipine  (NORVASC ) 10 MG tablet, TAKE 1 TABLET DAILY, Disp: 90 tablet, Rfl: 3   atorvastatin  (LIPITOR) 10 MG tablet, Take 1 tablet (10 mg total) by mouth daily., Disp: 90 tablet, Rfl: 3   labetalol  (NORMODYNE ) 100 MG tablet, Take 1.5 tablets (150 mg total) by mouth in the morning and at bedtime., Disp: 21 tablet, Rfl: 0   levETIRAcetam   (KEPPRA ) 500 MG tablet, Take 1 tablet (500 mg total) by mouth 2 (two) times daily., Disp: 60 tablet, Rfl: 2   losartan  (COZAAR ) 50 MG tablet, Take 1 tablet (50 mg total) by mouth daily., Disp: , Rfl:    pantoprazole  (PROTONIX ) 40 MG tablet, Take 1 tablet (40 mg total) by mouth 2 (two) times daily., Disp: 60 tablet, Rfl: 2   polyethylene glycol (MIRALAX  / GLYCOLAX ) 17 g packet, Take 17 g by mouth daily as needed for mild constipation., Disp: 14 each, Rfl: 0   zolpidem  (AMBIEN ) 5 MG tablet, Take 1 tablet (5 mg total) by mouth at bedtime as needed for sleep., Disp: 30 tablet, Rfl: 0   ferrous sulfate  325 (65 FE) MG tablet, Take 1 tablet (325 mg total) by mouth every other day. (Patient not taking: Reported on 02/07/2024), Disp: , Rfl:   Allergies: No Known Allergies  Past Medical History, Surgical history, Social history, and Family History were reviewed and updated.  Review of Systems: Review of Systems  Constitutional: Negative.   HENT:  Negative.    Eyes: Negative.   Respiratory: Negative.    Cardiovascular: Negative.   Gastrointestinal: Negative.   Endocrine: Negative.   Genitourinary: Negative.    Musculoskeletal: Negative.   Skin: Negative.   Neurological: Negative.   Hematological: Negative.  Psychiatric/Behavioral: Negative.      Physical Exam:  height is 5\' 7"  (1.702 m) and weight is 149 lb (67.6 kg). His tympanic temperature is 97.9 F (36.6 C). His blood pressure is 123/78 (pended) and his pulse is 76. His respiration is 20 and oxygen saturation is 100%.   Wt Readings from Last 3 Encounters:  02/09/24 149 lb (67.6 kg)  02/07/24 148 lb (67.1 kg)  02/04/24 143 lb 3.2 oz (65 kg)    Physical Exam Vitals reviewed.  HENT:     Head: Normocephalic and atraumatic.     Comments: Head neck exam shows a dressing on the surgical scar on the right scalp. Eyes:     Pupils: Pupils are equal, round, and reactive to light.  Cardiovascular:     Rate and Rhythm: Normal rate and  regular rhythm.     Heart sounds: Normal heart sounds.  Pulmonary:     Effort: Pulmonary effort is normal.     Breath sounds: Normal breath sounds.  Abdominal:     General: Bowel sounds are normal.     Palpations: Abdomen is soft.  Musculoskeletal:        General: No tenderness or deformity. Normal range of motion.     Cervical back: Normal range of motion.  Lymphadenopathy:     Cervical: No cervical adenopathy.  Skin:    General: Skin is warm and dry.     Findings: No erythema or Haan.  Neurological:     Mental Status: He is alert and oriented to person, place, and time.  Psychiatric:        Behavior: Behavior normal.        Thought Content: Thought content normal.        Judgment: Judgment normal.      Lab Results  Component Value Date   WBC 9.6 02/09/2024   HGB 11.8 (L) 02/09/2024   HCT 37.3 (L) 02/09/2024   MCV 77.4 (L) 02/09/2024   PLT 537 (H) 02/09/2024     Chemistry      Component Value Date/Time   NA 133 (L) 02/09/2024 1118   K 4.5 02/09/2024 1118   CL 99 02/09/2024 1118   CO2 27 02/09/2024 1118   BUN 20 02/09/2024 1118   CREATININE 0.91 02/09/2024 1118   CREATININE 0.85 01/21/2018 1500      Component Value Date/Time   CALCIUM  9.6 02/09/2024 1118   ALKPHOS 150 (H) 02/09/2024 1118   AST 13 (L) 02/09/2024 1118   ALT 11 02/09/2024 1118   BILITOT 0.4 02/09/2024 1118      Impression and Plan: Matthew Lee is a very nice 68 year old white male.  He has hopefully noted but localized adenocarcinoma of the stomach.  The PET scan will certainly help us  out.  I think he is in good shape that he could tolerate neoadjuvant chemotherapy.  Again, I think the FLOT regimen is recommended.  He will need to have a Port-A-Cath placed.  I will have to see but try to get this next week.  We will have to work around his physical therapy for the subdural hematoma.  I would not think this would be much of a problem.  I am sure that he will need to have a G-CSF to help  maintain his white blood cell count.  I would give him 4 cycles of FLOT.  We are then repeat the PET scan and endoscopy to see what kind of response we have attained.  Again the PET scan  that he will have on Friday will be helpful.  If, for summary, there is obvious metastatic disease, we will have to make a change in our recommendations.  Again, we will try to get started next week.  I will then plan to see him back when he starts his second cycle of FLOT.   Ivor Mars, MD 5/14/20251:40 PM

## 2024-02-10 ENCOUNTER — Encounter: Payer: Self-pay | Admitting: *Deleted

## 2024-02-10 ENCOUNTER — Other Ambulatory Visit: Payer: Self-pay

## 2024-02-10 ENCOUNTER — Encounter: Payer: Self-pay | Admitting: Hematology & Oncology

## 2024-02-10 ENCOUNTER — Telehealth (HOSPITAL_COMMUNITY): Payer: Self-pay | Admitting: Hematology & Oncology

## 2024-02-10 ENCOUNTER — Encounter: Payer: Self-pay | Admitting: Speech Pathology

## 2024-02-10 ENCOUNTER — Ambulatory Visit: Admitting: Speech Pathology

## 2024-02-10 DIAGNOSIS — R41841 Cognitive communication deficit: Secondary | ICD-10-CM | POA: Diagnosis not present

## 2024-02-10 NOTE — Telephone Encounter (Signed)
 Called pt to schedule port placement at [facility]. No answer and VM full on mobile phone. I did leave a VM on his home phone to call me back. JM

## 2024-02-10 NOTE — Therapy (Signed)
 OUTPATIENT SPEECH LANGUAGE PATHOLOGY TREATMENT   Patient Name: Matthew Lee MRN: [id number] DOB:10/02/1955, 68 y.o., male Today's Date: 02/10/2024  PCP: Dorrene Gaucher, NP REFERRING PROVIDER: Star East, PA-C  END OF SESSION:  End of Session - 02/10/24 0838     Visit Number 2    Number of Visits 17    Date for SLP Re-Evaluation 04/10/24    SLP Start Time 0835    SLP Stop Time  0915    SLP Time Calculation (min) 40 min    Activity Tolerance Patient tolerated treatment well             Past Medical History:  Diagnosis Date   Abnormal weight loss 11/02/2022   Aneurysm of thoracic aorta (HCC) 09/2022   dilated to 4 cm on CT   Cholelithiasis 11/02/2022   Colon cancer screening 07/31/2022   Coronary artery calcification 10/08/2022   Diverticulitis    Dyspnea on exertion 11/11/2022   Elevated PSA 07/31/2022   Essential hypertension 09/26/2009   Qualifier: Diagnosis of   By: Luster Salters MD, Sheilda Deputy 11/11/2022   Gall bladder polyp 02/18/2015   Overview:   - 6 mm (as described from US  abd in 2015). Repeat Us  abd lim ordered.   GERD (gastroesophageal reflux disease) 09/07/2013   Hepatitis C 08/24/2012   TX and cured per pt   History of COVID-19 07/31/2022   History of hepatitis C 11/11/2022   History of tobacco abuse    Hyperglycemia 11/22/2014   Hypertension    long standing since 2007, was treated for sometime, but then discontinued taking meds on his own.   Immunization counseling 04/01/2022   Insomnia 10/31/2009   Qualifier: Diagnosis of   By: Evlyn Hoffmann MD, Amanjot       Mild diastolic dysfunction 08/22/2012   Mixed dyslipidemia 11/11/2022   Preventative health care 12/31/2014   Prominent abdominal aortic pulsation 11/11/2022   Pulmonary edema, acute (HCC)    hospitalized in 09/2009 with hypertensive crisis and acute pulmonary edema- improved with lasix  nad labetalol .   Past Surgical History:  Procedure Laterality Date   CRANIOTOMY Right  01/25/2024   Procedure: CRANIOTOMY HEMATOMA EVACUATION SUBDURAL;  Surgeon: Van Gelinas, MD;  Location: [facility] OR;  Service: Neurosurgery;  Laterality: Right;   ESOPHAGOGASTRODUODENOSCOPY  08/21/2011   Procedure: ESOPHAGOGASTRODUODENOSCOPY (EGD);  Surgeon: Tami Falcon, MD;  Location: [facility];  Service: Endoscopy;  Laterality: N/A;   ESOPHAGOGASTRODUODENOSCOPY N/A 02/07/2024   Procedure: EGD (ESOPHAGOGASTRODUODENOSCOPY);  Surgeon: Normie Becton., MD;  Location: [facility];  Service: Gastroenterology;  Laterality: N/A;   EUS N/A 02/07/2024   Procedure: ULTRASOUND, UPPER GI TRACT, ENDOSCOPIC;  Surgeon: Brice Campi Albino Alu., MD;  Location: [facility];  Service: Gastroenterology;  Laterality: N/A;   IR US  LIVER BIOPSY  01/28/2024   TONSILLECTOMY     Patient Active Problem List   Diagnosis Date Noted   Gastric adenocarcinoma (HCC) 02/07/2024   Enlarged lymph nodes 02/07/2024   GE junction carcinoma (HCC) 02/04/2024   Pulmonary nodule 02/04/2024   Subdural hematoma (HCC) 01/25/2024   Iron deficiency anemia 12/18/2023   Ex-smoker 11/11/2022   History of hepatitis C 11/11/2022   Mixed dyslipidemia 11/11/2022   Dyspnea on exertion 11/11/2022   Prominent abdominal aortic pulsation 11/11/2022   Diverticulitis 11/02/2022   Pulmonary edema, acute (HCC) 11/02/2022   Abnormal weight loss 11/02/2022   Cholelithiasis 11/02/2022   Coronary artery calcification 10/08/2022   Aneurysm of thoracic aorta (HCC) 09/2022  Elevated PSA 07/31/2022   History of COVID-19 07/31/2022   Colon cancer screening 07/31/2022   Immunization counseling 04/01/2022   Gall bladder polyp 02/18/2015   Preventative health care 12/31/2014   Hyperglycemia 11/22/2014   GERD (gastroesophageal reflux disease) 09/07/2013   Hepatitis C 08/24/2012   Mild diastolic dysfunction 08/22/2012   Insomnia 10/31/2009   History of tobacco abuse 09/26/2009   Hypertension 09/26/2009    ONSET DATE: 01/25/24   REFERRING  DIAG: Z61.5XAA (ICD-10-CM) - Subdural hematoma (HCC)  THERAPY DIAG:  Cognitive communication deficit  Rationale for Evaluation and Treatment: Rehabilitation  SUBJECTIVE:   SUBJECTIVE STATEMENT: Pt reports he took aspirin - but is not supposed to be taking aspirin.   Pt accompanied by: self  PERTINENT HISTORY: Per chart review:  PMHx significant for HTN, HLD, CAD, HFpEF, thoracic aortic aneurysm, chronic HCV s/p treatment, IDA, SDH  PAIN:  Are you having pain? No  FALLS: Has patient fallen in last 6 months?  Yes; 1 (after this hospital stay)   LIVING ENVIRONMENT: Lives with: lives with their spouse and and two grandsons [name])  Lives in: House/apartment  PLOF:  Level of assistance: Independent with ADLs, Independent with IADLs Employment: Full-time employment - goal is to return to [employer] employment   PATIENT GOALS: memory, attention/focus   OBJECTIVE:  Note: Objective measures were completed at Evaluation unless otherwise noted.  DIAGNOSTIC FINDINGS: Per chart review:   CT HEAD WO CONTRAST 01/25/2024  IMPRESSION: 1. Large and mostly isodense Right Subdural Hematoma, estimated up to 23 mm thickness, and probably septated. 2. Intracranial mass effect with leftward midline shift of 9 mm. 3. No skull fracture or other acute intracranial abnormality identified.   Electronically Signed: By: Marlise Simpers M.D. On: 01/25/2024 12:55  COGNITION: Overall cognitive status: Impaired Areas of impairment:  Attention: Impaired: Selective, Alternating, Divided Memory: Impaired: Short term Awareness: Impaired: Emergent Executive function: Impaired: Problem solving, Organization, Planning, Error awareness, and Self-correction Functional deficits: Pt and wife had difficulty identifying problems, though report he has some difficulty remembering information. Pt reports he has trouble "focusing".   COGNITIVE COMMUNICATION: Following directions: Follows multi-step commands inconsistently   Auditory comprehension: Impaired: Likely 2/2 to attention Verbal expression: Impaired: Notes some difficulty with expressing thoughts  Functional communication: Impaired: Communicating needs/wants effectively, decreased ability to recall details from conversations   ORAL MOTOR EXAMINATION: Overall status: Did not assess Comments: NA  STANDARDIZED ASSESSMENTS: Initiated CLQT - to complete next session    Cognitive Linguistic Quick Test: AGE - 18 - 69   The Cognitive Linguistic Quick Test (CLQT) was administered to assess the relative status of five cognitive domains: attention, memory, language, executive functioning, and visuospatial skills. Scores from 10 tasks were used to estimate severity ratings (standardized for age groups 18-69 years and 70-89 years) for each domain, a clock drawing task, as well as an overall composite severity rating of cognition.       Task Score Criterion Cut Scores  Personal Facts 8/8 8  Symbol Cancellation 11/12 11  Confrontation Naming 10/10 10  Clock Drawing  5/13 12  Story Retelling 8/10 6  Symbol Trails 2/10 9  Generative Naming 3/9 5  Design Memory 5/6 5  Mazes  4/8 7  Design Generation 4/13 6      Cognitive Domain Composite Score Severity Rating  Attention 151/215 Mild  Memory 157/185 WNL  Executive Function 13/40 Severe  Language 29/37 WNL  Visuospatial Skills 62/105 Mild  Clock Drawing  5/13 Severe  Composite Severity Rating  Mild      PATIENT REPORTED OUTCOME MEASURES (PROM): Cognitive Functioning Neuro QOL: 104/140                                                                                                                            TREATMENT DATE:   02/10/24: Pt was seen for skilled ST services targeting continued assessment. Pt completed CLQT this session and Cognitive Function PROMs. See above in blue. Pt reports some stress with cognitive function and CA dx. To provide with relaxation strategies. Goals have been  updated.    PATIENT EDUCATION: Education details: SLP role in cognitive-communication Person educated: Patient and Spouse Education method: Explanation Education comprehension: verbalized understanding and needs further education   GOALS: Goals reviewed with patient? Yes  SHORT TERM GOALS: Target date: 03/11/24  Pt will complete CLQT and goals will be updated Baseline:MET  2.  Pt will complete Cognitive PROM Baseline:  Goal status: MET  3.  Pt will verbalize 3 memory strategies to be used in recall of important information Baseline:  Goal status: INITIAL  4.  Pt will verbalize 3 strategies to increase focus on cognitive tasks Baseline:  Goal status: INITIAL    LONG TERM GOALS: Target date: 04/10/24  Pt will improve score on PROMS Baseline:  Goal status: INITIAL  2.  Pt/wife will report successful use of memory strategies at home Baseline:  Goal status: INITIAL  3.  Pt/wife will report successful use of attention strategies at home Baseline:  Goal status: INITIAL    ASSESSMENT:  CLINICAL IMPRESSION: Pt is a 68 yo male who presents to ST OP for evaluation post SDH. Pt was accompanied by his wife, [name], to today's assessment. Pt and wife endorse changes in attention and memory - both pt and wife reported they felt he was 75% back to baseline. Pt also reports some sound sensitivity to louder music. Pt was assessed using CLQT - to complete next session. SLP observed pt requiring repetition of directions and difficulty following multi-step directions, decreased attention, impaired topic maintenance, slowed processing/ability to retrieve words in structured task. SLP rec skilled ST services to address cognitive-communication impairment, as pt reports he would like to return to work.    OBJECTIVE IMPAIRMENTS: include attention, memory, awareness, and executive functioning. These impairments are limiting patient from return to work, managing medications, managing  appointments, managing finances, household responsibilities, ADLs/IADLs, and effectively communicating at home and in community. Factors affecting potential to achieve goals and functional outcome are NA.Aaron Aas Patient will benefit from skilled SLP services to address above impairments and improve overall function.  REHAB POTENTIAL: Good  PLAN:  SLP FREQUENCY: 1-2x/week  SLP DURATION: 8 weeks  PLANNED INTERVENTIONS: Environmental controls, Cueing hierachy, Internal/external aids, Functional tasks, SLP instruction and feedback, Compensatory strategies, Patient/family education, and [id number] Treatment of speech (30 or 45 min)     [employer], CCC-SLP 02/10/2024, 8:39 AM

## 2024-02-10 NOTE — Progress Notes (Unsigned)
 Spoke with IR team this morning to get port scheduled before preferred chemo start date of 02/17/2024. They called patient but unable to reach him. Voicemail left.   I called and was able to speak to patient at 1:45p. Chemo education scheduled for 02/15/2024. Asked patient to call IR to schedule his port. He asked that I send him the number via MyChart which I did.  Oncology Nurse Navigator Documentation     02/10/2024    1:45 PM  Oncology Nurse Navigator Flowsheets  Navigator Follow Up Date: 02/16/2024  Navigator Follow Up Reason: Chemotherapy  Navigator Location [facility]  Navigator Encounter Type Telephone;MyChart  Telephone Outgoing Call  Patient Visit Type MedOnc  Treatment Phase Pre-Tx/Tx Discussion  Barriers/Navigation Needs Coordination of Care;Education  Interventions Education;Coordination of Care  Acuity Level 2-Minimal Needs (1-2 Barriers Identified)  Coordination of Care Radiology  Education Method Verbal;Written  Support Groups/Services Friends and Family  Time Spent with Patient 30

## 2024-02-11 ENCOUNTER — Ambulatory Visit (HOSPITAL_COMMUNITY)
Admission: RE | Admit: 2024-02-11 | Discharge: 2024-02-11 | Disposition: A | Discharge status: Home / Self Care | Source: Ambulatory Visit | Attending: Hematology & Oncology | Admitting: Hematology & Oncology

## 2024-02-11 ENCOUNTER — Encounter: Payer: Self-pay | Admitting: Hematology & Oncology

## 2024-02-11 ENCOUNTER — Encounter: Admitting: Thoracic Surgery (Cardiothoracic Vascular Surgery)

## 2024-02-11 ENCOUNTER — Other Ambulatory Visit: Payer: Self-pay | Admitting: Radiology

## 2024-02-11 DIAGNOSIS — C160 Malignant neoplasm of cardia: Secondary | ICD-10-CM | POA: Diagnosis not present

## 2024-02-11 DIAGNOSIS — C159 Malignant neoplasm of esophagus, unspecified: Secondary | ICD-10-CM | POA: Diagnosis not present

## 2024-02-11 LAB — GLUCOSE, CAPILLARY: Glucose-Capillary: 85 mg/dL (ref 70–99)

## 2024-02-11 MED ORDER — FLUDEOXYGLUCOSE F - 18 (FDG) INJECTION
7.8000 | Freq: Once | INTRAVENOUS | Status: AC
  Administered 2024-02-11: 7.39 via INTRAVENOUS

## 2024-02-13 ENCOUNTER — Other Ambulatory Visit: Payer: Self-pay | Admitting: Student

## 2024-02-13 ENCOUNTER — Ambulatory Visit: Payer: Self-pay | Admitting: Gastroenterology

## 2024-02-14 ENCOUNTER — Ambulatory Visit: Admitting: Speech Pathology

## 2024-02-14 ENCOUNTER — Other Ambulatory Visit: Payer: Self-pay

## 2024-02-14 ENCOUNTER — Other Ambulatory Visit: Payer: Self-pay | Admitting: *Deleted

## 2024-02-14 ENCOUNTER — Ambulatory Visit (HOSPITAL_COMMUNITY)
Admission: RE | Admit: 2024-02-14 | Discharge: 2024-02-14 | Disposition: A | Discharge status: Home / Self Care | Source: Ambulatory Visit | Attending: Hematology & Oncology | Admitting: Hematology & Oncology

## 2024-02-14 DIAGNOSIS — C169 Malignant neoplasm of stomach, unspecified: Secondary | ICD-10-CM

## 2024-02-14 DIAGNOSIS — C159 Malignant neoplasm of esophagus, unspecified: Secondary | ICD-10-CM | POA: Diagnosis not present

## 2024-02-14 DIAGNOSIS — C160 Malignant neoplasm of cardia: Secondary | ICD-10-CM | POA: Diagnosis not present

## 2024-02-14 HISTORY — PX: IR IMAGING GUIDED PORT INSERTION: IMG5740

## 2024-02-14 MED ORDER — HEPARIN SOD (PORK) LOCK FLUSH 100 UNIT/ML IV SOLN
INTRAVENOUS | Status: AC
Start: 2024-02-14 — End: ?
  Filled 2024-02-14: qty 5

## 2024-02-14 MED ORDER — SODIUM CHLORIDE 0.9 % IV SOLN
INTRAVENOUS | Status: DC
Start: 2024-02-14 — End: 2024-02-15

## 2024-02-14 MED ORDER — PROCHLORPERAZINE MALEATE 10 MG PO TABS: 10.0000 mg | ORAL_TABLET | Freq: Four times a day (QID) | ORAL | 1 refills | Status: DC | PRN

## 2024-02-14 MED ORDER — DEXAMETHASONE 4 MG PO TABS
8.0000 mg | ORAL_TABLET | Freq: Every day | ORAL | 1 refills | Status: DC
Start: 2024-02-14 — End: 2024-05-26

## 2024-02-14 MED ORDER — LIDOCAINE HCL 1 % IJ SOLN
INTRAMUSCULAR | Status: AC
  Filled 2024-02-14: qty 20

## 2024-02-14 MED ORDER — LIDOCAINE HCL 1 % IJ SOLN
20.0000 mL | Freq: Once | INTRAMUSCULAR | Status: AC
  Administered 2024-02-14: 20 mL

## 2024-02-14 MED ORDER — MIDAZOLAM HCL 2 MG/2ML IJ SOLN
INTRAMUSCULAR | Status: AC | PRN
  Administered 2024-02-14 (×2): 1 mg via INTRAVENOUS

## 2024-02-14 MED ORDER — ONDANSETRON HCL 8 MG PO TABS: 8.0000 mg | ORAL_TABLET | Freq: Three times a day (TID) | ORAL | 1 refills | Status: DC | PRN

## 2024-02-14 MED ORDER — MIDAZOLAM HCL 2 MG/2ML IJ SOLN
INTRAMUSCULAR | Status: AC
  Filled 2024-02-14: qty 2

## 2024-02-14 NOTE — H&P (Signed)
 Chief Complaint: GE Junction carcinoma - IR consulted for port-a-catheter placement  Referring Provider(s): Ennever, Sherryll Donald, MD   Supervising Physician: Erica Hau  Patient Status: [facility] - Out-pt  History of Present Illness: Matthew Lee is a 68 y.o. male with hx of GE junction carcinoma presenting to IR service for port-a-catheter placement for chemotherapy. Initially presented as difficulty swallowing - he saw GI in April 2025 and had endoscopy showing circumferential lesion in the lower esophagus. CT scans done on 4/25 showed nodular wall thickening of upper stomach and adjacent reactive lymph nodes. Established with Dr. Maria Shiner with oncology.   Pt without complaint today. Is recovering from recent subdural hematoma after a fall, but doing well. Eating and drinking without difficulty. No change in bowel habits. No HA, CP, fevers, or other associated symptoms.  Pt did eat this morning around 0730, says he was unaware he was supposed to be NPO, he is ok proceeding with 1 drug minimal sedation for port placement.   Patient is Full Code  Past Medical History:  Diagnosis Date   Abnormal weight loss 11/02/2022   Aneurysm of thoracic aorta (HCC) 09/2022   dilated to 4 cm on CT   Cholelithiasis 11/02/2022   Colon cancer screening 07/31/2022   Coronary artery calcification 10/08/2022   Diverticulitis    Dyspnea on exertion 11/11/2022   Elevated PSA 07/31/2022   Essential hypertension 09/26/2009   Qualifier: Diagnosis of   By: Luster Salters MD, Sheilda Deputy 11/11/2022   Gall bladder polyp 02/18/2015   Overview:   - 6 mm (as described from US  abd in 2015). Repeat Us  abd lim ordered.   GERD (gastroesophageal reflux disease) 09/07/2013   Hepatitis C 08/24/2012   TX and cured per pt   History of COVID-19 07/31/2022   History of hepatitis C 11/11/2022   History of tobacco abuse    Hyperglycemia 11/22/2014   Hypertension    long standing since 2007, was treated for  sometime, but then discontinued taking meds on his own.   Immunization counseling 04/01/2022   Insomnia 10/31/2009   Qualifier: Diagnosis of   By: Evlyn Hoffmann MD, Amanjot       Mild diastolic dysfunction 08/22/2012   Mixed dyslipidemia 11/11/2022   Preventative health care 12/31/2014   Prominent abdominal aortic pulsation 11/11/2022   Pulmonary edema, acute (HCC)    hospitalized in 09/2009 with hypertensive crisis and acute pulmonary edema- improved with lasix  nad labetalol .    Past Surgical History:  Procedure Laterality Date   CRANIOTOMY Right 01/25/2024   Procedure: CRANIOTOMY HEMATOMA EVACUATION SUBDURAL;  Surgeon: Van Gelinas, MD;  Location: [facility] OR;  Service: Neurosurgery;  Laterality: Right;   ESOPHAGOGASTRODUODENOSCOPY  08/21/2011   Procedure: ESOPHAGOGASTRODUODENOSCOPY (EGD);  Surgeon: Tami Falcon, MD;  Location: [facility] ENDOSCOPY;  Service: Endoscopy;  Laterality: N/A;   ESOPHAGOGASTRODUODENOSCOPY N/A 02/07/2024   Procedure: EGD (ESOPHAGOGASTRODUODENOSCOPY);  Surgeon: Normie Becton., MD;  Location: [facility];  Service: Gastroenterology;  Laterality: N/A;   EUS N/A 02/07/2024   Procedure: ULTRASOUND, UPPER GI TRACT, ENDOSCOPIC;  Surgeon: Brice Campi Albino Alu., MD;  Location: [facility] ENDOSCOPY;  Service: Gastroenterology;  Laterality: N/A;   IR US  LIVER BIOPSY  01/28/2024   TONSILLECTOMY      Allergies: Patient has no known allergies.  Medications: Prior to Admission medications   Medication Sig Start Date End Date Taking? Authorizing Provider  amLODipine  (NORVASC ) 10 MG tablet TAKE 1 TABLET DAILY 12/15/23  Yes O'Sullivan, Melissa, NP  atorvastatin  (  LIPITOR) 10 MG tablet Take 1 tablet (10 mg total) by mouth daily. 12/23/23 03/22/24 Yes Revankar, Micael Adas, MD  labetalol  (NORMODYNE ) 100 MG tablet Take 1.5 tablets (150 mg total) by mouth in the morning and at bedtime. 12/15/23  Yes Dorrene Gaucher, NP  levETIRAcetam  (KEPPRA ) 500 MG tablet Take 1 tablet (500 mg total) by mouth 2  (two) times daily. 01/29/24  Yes Akula, Vijaya, MD  losartan  (COZAAR ) 50 MG tablet Take 1 tablet (50 mg total) by mouth daily. 02/04/24  Yes O'Sullivan, Melissa, NP  pantoprazole  (PROTONIX ) 40 MG tablet Take 1 tablet (40 mg total) by mouth 2 (two) times daily. 01/04/24  Yes McMichael, Bayley M, PA-C  polyethylene glycol (MIRALAX  / GLYCOLAX ) 17 g packet Take 17 g by mouth daily as needed for mild constipation. 01/29/24  Yes Akula, Vijaya, MD  zolpidem  (AMBIEN ) 5 MG tablet Take 1 tablet (5 mg total) by mouth at bedtime as needed for sleep. 02/04/24  Yes Dorrene Gaucher, NP  ferrous sulfate  325 (65 FE) MG tablet Take 1 tablet (325 mg total) by mouth every other day. Patient not taking: Reported on 02/07/2024 12/18/23   Dorrene Gaucher, NP     Family History  Problem Relation Age of Onset   Polycystic kidney disease Father    Polycystic kidney disease Half-Brother    Polycystic kidney disease Half-Brother    Polycystic kidney disease Half-Sister    Colon cancer Neg Hx    Esophageal cancer Neg Hx    Stomach cancer Neg Hx    Pancreatic cancer Neg Hx     Social History   Socioeconomic History   Marital status: Married    Spouse name: Not on file   Number of children: Not on file   Years of education: Not on file   Highest education level: Some college, no degree  Occupational History   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   Occupation: Garment/textile technologist: [employer]  Tobacco Use   Smoking status: Former    Current packs/day: 0.00    Average packs/day: 1 pack/day for 40.0 years (40.0 ttl pk-yrs)    Types: Cigarettes    Start date: 09/23/1969    Quit date: 09/23/2009    Years since quitting: 14.4   Smokeless tobacco: Never  Vaping Use   Vaping status: Never Used  Substance and Sexual Activity   Alcohol use: Not Currently    Comment: Former drinker.   Drug use: No   Sexual activity: Not Currently  Other Topics Concern   Not on file  Social  History Narrative   Lives at home with wife, daughter and her 2 children.   Caffeine use:  1 daily   Works a Aeronautical engineer (Museum/gallery conservator)   Enjoys Optician, dispensing, Civil engineer, contracting.         Social Drivers of Corporate investment banker Strain: Low Risk  (12/15/2023)   Overall Financial Resource Strain (CARDIA)    Difficulty of Paying Living Expenses: Not hard at all  Food Insecurity: No Food Insecurity (01/25/2024)   Hunger Vital Sign    Worried About Running Out of Food in the Last Year: Never true    Ran Out of Food in the Last Year: Never true  Transportation Needs: No Transportation Needs (01/25/2024)   PRAPARE - Administrator, Civil Service (Medical): No    Lack of Transportation (Non-Medical): No  Physical Activity: Sufficiently Active (12/15/2023)   Exercise Vital Sign  Days of Exercise per Week: 5 days    Minutes of Exercise per Session: 40 min  Stress: Stress Concern Present (12/15/2023)   [employer] of Occupational Health - Occupational Stress Questionnaire    Feeling of Stress : To some extent  Social Connections: Socially Isolated (01/25/2024)   Social Connection and Isolation Panel [NHANES]    Frequency of Communication with Friends and Family: Twice a week    Frequency of Social Gatherings with Friends and Family: Never    Attends Religious Services: Never    Database administrator or Organizations: No    Attends Engineer, structural: Never    Marital Status: Married     Review of Systems: A 12 point ROS discussed and pertinent positives are indicated in the HPI above.  All other systems are negative.    Vital Signs: BP 127/75   Pulse 72   Temp 97.8 F (36.6 C) (Oral)   Resp 18   Ht 5\' 7"  (1.702 m)   Wt 145 lb (65.8 kg)   SpO2 98%   BMI 22.71 kg/m   Advance Care Plan: No documents on file  Physical Exam Vitals and nursing note reviewed.  Constitutional:      Comments: Resting comfortably in bed  HENT:      Head:     Comments: Post surgical dressing noted on right scalp from prior subdural bleed s/p fall    Mouth/Throat:     Mouth: Mucous membranes are moist.     Pharynx: Oropharynx is clear.  Cardiovascular:     Rate and Rhythm: Normal rate and regular rhythm.  Pulmonary:     Effort: Pulmonary effort is normal.     Breath sounds: Normal breath sounds.  Abdominal:     Palpations: Abdomen is soft.     Tenderness: There is no abdominal tenderness.  Musculoskeletal:     Right lower leg: No edema.     Left lower leg: No edema.  Skin:    General: Skin is warm and dry.  Neurological:     Mental Status: He is alert and oriented to person, place, and time. Mental status is at baseline.     Imaging: IR LYMPH NODE CORE BIOPSY Result Date: 01/28/2024 INDICATION: History of GI tract tumor at the GE junction. Enlarged RIGHT axillary lymph node on CT. EXAM: ULTRASOUND-GUIDED RIGHT AXIAL LYMPH NODE BIOPSY COMPARISON:  CT CAP, 01/21/2024 MEDICATIONS: None ANESTHESIA/SEDATION: Local anesthetic was administered. COMPLICATIONS: None immediate. TECHNIQUE: Informed written consent was obtained from the patient after a discussion of the risks, benefits and alternatives to treatment. Questions regarding the procedure were encouraged and answered. Initial ultrasound scanning demonstrated enlarged RIGHT axilla lymph node. An ultrasound image was saved for documentation purposes. The procedure was planned. A timeout was performed prior to the initiation of the procedure. The operative was prepped and draped in the usual sterile fashion, and a sterile drape was applied covering the operative field. A timeout was performed prior to the initiation of the procedure. Local anesthesia was provided with 1% lidocaine  with epinephrine . Under direct ultrasound guidance, an 18 gauge core needle device was utilized to obtain to obtain 3 core needle biopsies of the RIGHT axial lymph node. The samples were placed in saline and  submitted to pathology. The needle was removed and superficial hemostasis was achieved with manual compression. Post procedure scan was negative for significant hematoma. A dressing was applied. The patient tolerated the procedure well without immediate postprocedural complication. IMPRESSION: Successful ultrasound guided  core biopsy of an enlarged RIGHT axillary lymph node. Art Largo, MD [facility] Electronically Signed   By: Art Largo M.D.   On: 01/28/2024 16:37   MR BRAIN W WO CONTRAST Result Date: 01/27/2024 CLINICAL DATA:  Initial evaluation for metastatic disease evaluation, newly diagnosed gastroesophageal mass, status post subdural evacuation. EXAM: MRI HEAD WITHOUT AND WITH CONTRAST TECHNIQUE: Multiplanar, multiecho pulse sequences of the brain and surrounding structures were obtained without and with intravenous contrast. CONTRAST:  7mL GADAVIST  GADOBUTROL  1 MMOL/ML IV SOLN COMPARISON:  CT from 01/26/2024 FINDINGS: Brain: Examination mildly degraded by motion artifact. Postoperative changes from recent right-sided craniotomy for subdural evacuation again seen. Postoperative pneumocephalus overlies the right cerebral convexity. Residual right-sided subdural collection measures up to 2.4 cm at the right frontal convexity. Persistent mass effect on the subjacent right cerebral hemisphere with 7 mm of right-to-left shift, relatively stable when measured in a similar fashion on prior CT. No hydrocephalus or trapping. Basilar cisterns remain patent. An additional smaller left-sided subdural hematoma overlying the anterior left frontal convexity measures up to 4 mm without significant mass effect (series 7, image 23). Underlying age-related atrophy. Patchy T2/FLAIR hyperintensity involving the periventricular deep white matter both cerebral hemispheres, consistent with chronic small vessel ischemic disease, mild to moderate in nature. No evidence  for acute or subacute infarct. No areas of chronic cortical infarction. No acute intracranial hemorrhage. Single punctate chronic microhemorrhage noted within the left pons (series 10, image 11). No mass lesion, mass effect, or midline shift. Normal expected smooth postoperative dural enhancement noted overlying the right cerebral convexity. No evidence for intracranial metastatic disease. Pituitary gland and suprasellar region within normal limits. Vascular: Major intracranial vascular flow voids are maintained. Skull and upper cervical spine: Craniocervical junction within normal limits. Bone marrow signal intensity grossly normal allowing for motion artifact. Post craniotomy changes at the right calvarium and scalp without adverse features. Sinuses/Orbits: Globes orbital soft tissues within normal limits. Paranasal sinuses are largely clear. No significant mastoid effusion. Other: Than prior IMPRESSION: 1. Postoperative changes from recent right-sided craniotomy for subdural evacuation. Residual right-sided subdural collection measures up to 2.4 cm at the right frontal convexity, with persistent mass effect on the subjacent right cerebral hemisphere and 7 mm of right-to-left shift, relatively stable from recent postoperative head CT from 01/26/2024. No hydrocephalus or trapping. 2. Additional smaller 4 mm left-sided subdural hematoma overlying the anterior left frontal convexity without significant mass effect. 3. No evidence for intracranial metastatic disease. 4. Underlying age-related atrophy with mild to moderate chronic microvascular ischemic disease. Electronically Signed   By: Virgia Griffins M.D.   On: 01/27/2024 20:41   CT HEAD WO CONTRAST Result Date: 01/26/2024 CLINICAL DATA:  Follow-up craniotomy EXAM: CT HEAD WITHOUT CONTRAST TECHNIQUE: Contiguous axial images were obtained from the base of the skull through the vertex without intravenous contrast. RADIATION DOSE REDUCTION: This exam was  performed according to the departmental dose-optimization program which includes automated exposure control, adjustment of the mA and/or kV according to patient size and/or use of iterative reconstruction technique. COMPARISON:  01/25/2024 FINDINGS: Brain: Interval right frontal craniotomy for evacuation of large right-sided subdural hematoma. Subdural drain remains in place, directed inferiorly. There is reduction in the amount of pre-existing subdural blood material on the right with less mass effect, right-to-left shift of 5.4 mm presently compared with 8.8 mm on the prior study. Subdural air is present particularly in the right frontal region. Small amount of new hyperdense subdural blood related  to the surgical evacuation. No evidence of parenchymal bleeding. No ischemic infarction. No subarachnoid blood. No hydrocephalus. Vascular: There is atherosclerotic calcification of the major vessels at the base of the brain. Skull: Otherwise negative Sinuses/Orbits: Clear/normal Other: None IMPRESSION: Interval right frontal craniotomy for evacuation of large right-sided subdural hematoma. Subdural drain remains in place. Reduction in the amount of pre-existing subdural blood material on the right with less mass effect, right-to-left shift of 5.4 mm presently compared with 8.8 mm on the prior study. Small amount of new hyperdense subdural blood related to the surgical evacuation. Electronically Signed   By: Bettylou Brunner M.D.   On: 01/26/2024 08:31   CT HEAD WO CONTRAST Addendum Date: 01/25/2024 ADDENDUM REPORT: 01/25/2024 13:14 ADDENDUM: Study discussed by telephone with Dr. Jerald Molly on 01/25/2024 at 1302 hours. Electronically Signed   By: Marlise Simpers M.D.   On: 01/25/2024 13:14   Result Date: 01/25/2024 CLINICAL DATA:  68 year old male with altered mental status, dizziness, decreased balance, neurologic deficit, syncope. EXAM: CT HEAD WITHOUT CONTRAST TECHNIQUE: Contiguous axial images were obtained from the  base of the skull through the vertex without intravenous contrast. RADIATION DOSE REDUCTION: This exam was performed according to the departmental dose-optimization program which includes automated exposure control, adjustment of the mA and/or kV according to patient size and/or use of iterative reconstruction technique. COMPARISON:  None Available. FINDINGS: Brain: Large and mostly isodense extra-axial hemorrhage in the right hemisphere with thickness estimated up to 23 mm on coronal images (series 5, image 43). Probable internal septations (series 2, image 23. Relatively small volume of hyperdense blood products within the collection (series 2, image 15). Intracranial mass effect with leftward midline shift of 9 mm. Partially effaced suprasellar cistern. Other basilar cisterns maintained. Compressed right lateral ventricle. No ventriculomegaly. No discrete cytotoxic or cerebral edema is identified. Vascular: Calcified atherosclerosis at the skull base. No suspicious intracranial vascular hyperdensity. Skull: Intact, no fracture identified. Sinuses/Orbits: Visualized paranasal sinuses and mastoids are clear. Other: No acute orbit or scalp soft tissue finding. IMPRESSION: 1. Large and mostly isodense Right Subdural Hematoma, estimated up to 23 mm thickness, and probably septated. 2. Intracranial mass effect with leftward midline shift of 9 mm. 3. No skull fracture or other acute intracranial abnormality identified. Electronically Signed: By: Marlise Simpers M.D. On: 01/25/2024 12:55   CT CHEST ABDOMEN PELVIS W CONTRAST Result Date: 01/21/2024 CLINICAL DATA:  Gastric tumor at the GE junction. Abnormal EGD. * Tracking Code: BO * EXAM: CT CHEST, ABDOMEN, AND PELVIS WITH CONTRAST TECHNIQUE: Multidetector CT imaging of the chest, abdomen and pelvis was performed following the standard protocol during bolus administration of intravenous contrast. RADIATION DOSE REDUCTION: This exam was performed according to the departmental  dose-optimization program which includes automated exposure control, adjustment of the mA and/or kV according to patient size and/or use of iterative reconstruction technique. CONTRAST:  OMNIPAQUE  IOHEXOL  300 MG/ML  SOLN COMPARISON:  Lung cancer screening CT 10/07/2022. Abdomen pelvis CT 01/21/2018 and older. FINDINGS: CT CHEST FINDINGS Cardiovascular: Heart is nonenlarged. No pericardial effusion. Coronary artery calcifications are seen. The thoracic aorta is normal course and caliber with partially calcified scattered atherosclerotic plaque. Plaque also seen along the great vessels. There is some enlargement of the pulmonary arteries. Please correlate for pulmonary artery hypertension. Mediastinum/Nodes: Preserved thyroid  gland. There is an isolated enlarged right axillary node measuring 19 x 11 mm. Going back to study of 2024 this node measured 15 by 9 mm, slightly larger today. No left axillary node enlargement.  No hilar or mediastinal nodal enlargement at this time. Calcified left hilar nodes identified consistent with old granulomatous disease. Lungs/Pleura: There is some linear opacity lung bases likely scar or atelectasis. Breathing motion. No consolidation, pneumothorax or effusion. There is a punctate calcified nodule in the lingula on series 302, image 63 consistent with old granulomatous disease and is stable. Larger calcified left lower lobe nodule image 58. There is a stable noncalcified nodule left lower lobe series 302, image 84 measuring 3 mm. No new dominant lung nodule. Musculoskeletal: Mild curvature of the spine with some degenerative changes. CT ABDOMEN PELVIS FINDINGS Hepatobiliary: No focal liver abnormality is seen. No gallstones, gallbladder wall thickening, or biliary dilatation. Patent portal vein. Pancreas: Unremarkable. No pancreatic ductal dilatation or surrounding inflammatory changes. Spleen: Normal in size without focal abnormality.  Small splenule Adrenals/Urinary Tract:  Diffuse thickening of the adrenal glands is again seen. Right-sided stable. Left appears larger. Hounsfield units of this area on portal venous phase age of 2 and delay 76 consistent with relative washout of an adenoma. Bosniak 1 upper pole right-sided small renal cyst. Few other tiny bilateral sub 5 mm foci are identified, too small to completely characterize although statistically likely benign cystic lesions and no specific imaging follow-up. No enhancing renal mass or collecting system dilatation. Ureters have normal course and caliber extending down to the urinary bladder. Bladder is underdistended. Stomach/Bowel: Large bowel has a normal course and caliber. Scattered colonic stool. Left-sided colonic diverticulosis diffusely. Normal appendix in the right lower quadrant extending medial to the cecum and posterior. Of note there is transverse colon extending between the liver margin in the anterior abdominal wall. Small bowel is nondilated. There is nodular wall thickening along the stomach involving the fundus, cardia and midbody with irregular margins. There is some stranding and vascular engorgement. Please correlate for the location of the patient's known gastric neoplasm. Extension up to the level of the diaphragm and GE junction. Vascular/Lymphatic: Extensive vascular calcifications along the aorta and branch vessels. Normal caliber IVC. There are several small but prominent lymph nodes identified. These include gastric Paddock ligament on series 301, image 52 measuring 18 by 10 mm. Node on image 56 measuring 13 by 7 mm. Numerous other nodes extending in this location in to the celiac margin. Reproductive: Prostate is unremarkable. Other: No free air or free fluid. Musculoskeletal: Curvature of the spine with some degenerative changes. Trace anterolisthesis of L4 on L5. Multilevel disc bulging with some encroachment. IMPRESSION: Nodular wall thickening over a long segment of the upper stomach extending  from the GE junction to the midbody. Margins are somewhat irregular. Adjacent prominent lymph nodes identified in the gastrohepatic ligament, lesser curve towards the celiac margin. Regional spread of disease is possible. No specific abnormal nodes along the lower thorax. There is 1 prominent right axillary node, slightly larger than the study of 2024 of uncertain etiology and significance. Please correlate for any known history. Further workup with ultrasound or attention on follow-up for the patient's neoplasm in 3 months. Evidence of old granulomatous disease. Tiny new stable 3 mm noncalcified left lower lobe lung nodule. This has been stable for 1 year. Enlargement of the pulmonary arteries. Please correlate for pulmonary artery hypertension. Fatty liver infiltration.  Colonic diverticulosis. Electronically Signed   By: Adrianna Horde M.D.   On: 01/21/2024 14:38    Labs:  CBC: Recent Labs    01/26/24 0536 01/28/24 0712 02/02/24 1131 02/09/24 1118  WBC 11.6* 10.1 10.2 9.6  HGB 9.7* 10.9*  12.1* 11.8*  HCT 30.5* 33.3* 37.8* 37.3*  PLT 295 328 485* 537*    COAGS: Recent Labs    01/25/24 1202  INR 1.0  APTT 28    BMP: Recent Labs    01/28/24 0712 01/29/24 0908 02/02/24 1131 02/09/24 1118  NA 133* 131* 133* 133*  K 3.3* 3.8 4.2 4.5  CL 98 99 98 99  CO2 25 23 27 27   GLUCOSE 97 135* 109* 109*  BUN 11 14 16 20   CALCIUM  8.9 9.1 9.8 9.6  CREATININE 0.81 0.91 0.91 0.91  GFRNONAA >60 >60 >60 >60    LIVER FUNCTION TESTS: Recent Labs    01/04/24 1018 01/25/24 1202 02/02/24 1131 02/09/24 1118  BILITOT 0.6 0.2 0.3 0.4  AST 20 23 16  13*  ALT 16 16 16 11   ALKPHOS 94 117 108 150*  PROT 6.9 7.2 7.7 7.2  ALBUMIN 4.2 4.3 4.2 4.1    TUMOR MARKERS: No results for input(s): "AFPTM", "CEA", "CA199", "CHROMGRNA" in the last 8760 hours.  Assessment and Plan:  Matthew Lee is a 68 y.o. male with hx of GE junction carcinoma presenting to IR service for port-a-catheter placement for  chemotherapy. Initially presented as difficulty swallowing - he saw GI in April 2025 and had endoscopy showing circumferential lesion in the lower esophagus. CT scans done on 4/25 showed nodular wall thickening of upper stomach and adjacent reactive lymph nodes. Established with Dr. Maria Shiner with oncology.   Pt without complaint today. Is recovering from recent subdural hematoma after a fall, but doing well. Eating and drinking without difficulty. No change in bowel habits. No HA, CP, fevers, or other associated symptoms.  Pt did eat this morning around 0730, says he was unaware he was supposed to be NPO, he is ok proceeding with 1 drug minimal sedation for port placement.  Risks and benefits of image-guided Port-a-catheter placement were discussed with the patient including, but not limited to bleeding, infection, pneumothorax, or fibrin sheath development and need for additional procedures. All of the patient's questions were answered, patient is agreeable to proceed. Consent signed and in chart.   Thank you for allowing our service to participate in Matthew Lee 's care.  Electronically Signed: Nicolasa Barrett, PA-C   02/14/2024, 11:27 AM      I spent a total of  30 Minutes   in face to face in clinical consultation, greater than 50% of which was counseling/coordinating care for port-a-catheter placement.

## 2024-02-14 NOTE — Procedures (Signed)
 Interventional Radiology Procedure Note  Procedure: Single Lumen Power Port Placement    Access:  Right IJ vein.  Findings: Catheter tip positioned at SVC/RA junction. Port is ready for immediate use.   Complications: None  EBL: < 10 mL  Recommendations:  - Ok to shower in 24 hours - Do not submerge for 7 days - Routine line care   Maxi Carreras T. Fredia Sorrow, M.D Pager:  [phone]

## 2024-02-15 ENCOUNTER — Encounter: Payer: Self-pay | Admitting: *Deleted

## 2024-02-15 ENCOUNTER — Ambulatory Visit: Admitting: Speech Pathology

## 2024-02-15 ENCOUNTER — Encounter: Payer: Self-pay | Admitting: Speech Pathology

## 2024-02-15 ENCOUNTER — Inpatient Hospital Stay

## 2024-02-15 DIAGNOSIS — R41841 Cognitive communication deficit: Secondary | ICD-10-CM | POA: Diagnosis not present

## 2024-02-15 NOTE — Progress Notes (Signed)
 Patient here for chemo education today. He is scheduled tomorrow to begin treatment. His PET from Friday has not yet been read. Results are needed as his treatment may change for metastatic disease. Called reading room and requested that PET be read today.   Oncology Nurse Navigator Documentation     02/15/2024   10:00 AM  Oncology Nurse Navigator Flowsheets  Navigator Follow Up Date: 02/16/2024  Navigator Follow Up Reason: Chemotherapy  Navigator Location [facility]  Navigator Encounter Type Scan Review  Patient Visit Type MedOnc  Treatment Phase Pre-Tx/Tx Discussion  Barriers/Navigation Needs Coordination of Care;Education  Interventions Coordination of Care  Acuity Level 2-Minimal Needs (1-2 Barriers Identified)  Coordination of Care Radiology  Support Groups/Services Friends and Family  Time Spent with Patient 15

## 2024-02-15 NOTE — Progress Notes (Signed)
 Patient in chemotherapy education class with wife.  Discussed side effects of FLOT (5FU, Leucovorin, Oxaliplatin and 5FU)  which include but are not limited to myelosuppression, decreased appetite, fatigue, fever, allergic or infusional reaction, mucositis, cardiac toxicity, cough, SOB, altered taste, nausea and vomiting, diarrhea, constipation, elevated LFTs myalgia and arthralgias, hair loss or thinning, Myron, skin dryness, nail changes, peripheral neuropathy, discolored urine, delayed wound healing, mental changes (Chemo brain), increased risk of infections, weight loss.  Reviewed infusion room and office policy and procedure and phone numbers 24 hours x 7 days a week.  Reviewed ambulatory pump specifics and how to manage safe handling at home.  Reviewed when to call the office with any concerns or problems.  Transport planner given.  Discussed portacath insertion and EMLA cream administration.  Antiemetic protocol and chemotherapy schedule reviewed. Patient verbalized understanding of chemotherapy indications and possible side effects.  Teachback done

## 2024-02-15 NOTE — Therapy (Signed)
 OUTPATIENT SPEECH LANGUAGE PATHOLOGY TREATMENT   Patient Name: Matthew Lee MRN: [id number] DOB:09/12/1956, 68 y.o., male Today's Date: 02/15/2024  PCP: Dorrene Gaucher, NP REFERRING PROVIDER: Star East, PA-C  END OF SESSION:  End of Session - 02/15/24 1106     Visit Number 3    Number of Visits 17    Date for SLP Re-Evaluation 04/10/24    SLP Start Time 1100    SLP Stop Time  1140    SLP Time Calculation (min) 40 min    Activity Tolerance Patient tolerated treatment well             Past Medical History:  Diagnosis Date   Abnormal weight loss 11/02/2022   Aneurysm of thoracic aorta (HCC) 09/2022   dilated to 4 cm on CT   Cholelithiasis 11/02/2022   Colon cancer screening 07/31/2022   Coronary artery calcification 10/08/2022   Diverticulitis    Dyspnea on exertion 11/11/2022   Elevated PSA 07/31/2022   Essential hypertension 09/26/2009   Qualifier: Diagnosis of   By: Luster Salters MD, Sheilda Deputy 11/11/2022   Gall bladder polyp 02/18/2015   Overview:   - 6 mm (as described from US  abd in 2015). Repeat Us  abd lim ordered.   GERD (gastroesophageal reflux disease) 09/07/2013   Hepatitis C 08/24/2012   TX and cured per pt   History of COVID-19 07/31/2022   History of hepatitis C 11/11/2022   History of tobacco abuse    Hyperglycemia 11/22/2014   Hypertension    long standing since 2007, was treated for sometime, but then discontinued taking meds on his own.   Immunization counseling 04/01/2022   Insomnia 10/31/2009   Qualifier: Diagnosis of   By: Evlyn Hoffmann MD, Amanjot       Mild diastolic dysfunction 08/22/2012   Mixed dyslipidemia 11/11/2022   Preventative health care 12/31/2014   Prominent abdominal aortic pulsation 11/11/2022   Pulmonary edema, acute (HCC)    hospitalized in 09/2009 with hypertensive crisis and acute pulmonary edema- improved with lasix  nad labetalol .   Past Surgical History:  Procedure Laterality Date   CRANIOTOMY Right  01/25/2024   Procedure: CRANIOTOMY HEMATOMA EVACUATION SUBDURAL;  Surgeon: Van Gelinas, MD;  Location: [facility] OR;  Service: Neurosurgery;  Laterality: Right;   ESOPHAGOGASTRODUODENOSCOPY  08/21/2011   Procedure: ESOPHAGOGASTRODUODENOSCOPY (EGD);  Surgeon: Tami Falcon, MD;  Location: [facility];  Service: Endoscopy;  Laterality: N/A;   ESOPHAGOGASTRODUODENOSCOPY N/A 02/07/2024   Procedure: EGD (ESOPHAGOGASTRODUODENOSCOPY);  Surgeon: Normie Becton., MD;  Location: [facility];  Service: Gastroenterology;  Laterality: N/A;   EUS N/A 02/07/2024   Procedure: ULTRASOUND, UPPER GI TRACT, ENDOSCOPIC;  Surgeon: Brice Campi Albino Alu., MD;  Location: [facility];  Service: Gastroenterology;  Laterality: N/A;   IR IMAGING GUIDED PORT INSERTION  02/14/2024   IR US  LIVER BIOPSY  01/28/2024   TONSILLECTOMY     Patient Active Problem List   Diagnosis Date Noted   Gastric adenocarcinoma (HCC) 02/07/2024   Enlarged lymph nodes 02/07/2024   GE junction carcinoma (HCC) 02/04/2024   Pulmonary nodule 02/04/2024   Subdural hematoma (HCC) 01/25/2024   Iron deficiency anemia 12/18/2023   Ex-smoker 11/11/2022   History of hepatitis C 11/11/2022   Mixed dyslipidemia 11/11/2022   Dyspnea on exertion 11/11/2022   Prominent abdominal aortic pulsation 11/11/2022   Diverticulitis 11/02/2022   Pulmonary edema, acute (HCC) 11/02/2022   Abnormal weight loss 11/02/2022   Cholelithiasis 11/02/2022   Coronary artery calcification 10/08/2022  Aneurysm of thoracic aorta (HCC) 09/2022   Elevated PSA 07/31/2022   History of COVID-19 07/31/2022   Colon cancer screening 07/31/2022   Immunization counseling 04/01/2022   Gall bladder polyp 02/18/2015   Preventative health care 12/31/2014   Hyperglycemia 11/22/2014   GERD (gastroesophageal reflux disease) 09/07/2013   Hepatitis C 08/24/2012   Mild diastolic dysfunction 08/22/2012   Insomnia 10/31/2009   History of tobacco abuse 09/26/2009   Hypertension  09/26/2009    ONSET DATE: 01/25/24   REFERRING DIAG: N82.5XAA (ICD-10-CM) - Subdural hematoma (HCC)  THERAPY DIAG:  Cognitive communication deficit  Rationale for Evaluation and Treatment: Rehabilitation  SUBJECTIVE:   SUBJECTIVE STATEMENT: Pt reports chemo begins tomorrow.  Pt accompanied by: self  PERTINENT HISTORY: Per chart review:  PMHx significant for HTN, HLD, CAD, HFpEF, thoracic aortic aneurysm, chronic HCV s/p treatment, IDA, SDH  PAIN:  Are you having pain? No  FALLS: Has patient fallen in last 6 months?  Yes; 1 (after this hospital stay)   LIVING ENVIRONMENT: Lives with: lives with their spouse and and two grandsons [name])  Lives in: House/apartment  PLOF:  Level of assistance: Independent with ADLs, Independent with IADLs Employment: Full-time employment - goal is to return to [employer] employment   PATIENT GOALS: memory, attention/focus   OBJECTIVE:  Note: Objective measures were completed at Evaluation unless otherwise noted.  DIAGNOSTIC FINDINGS: Per chart review:   CT HEAD WO CONTRAST 01/25/2024  IMPRESSION: 1. Large and mostly isodense Right Subdural Hematoma, estimated up to 23 mm thickness, and probably septated. 2. Intracranial mass effect with leftward midline shift of 9 mm. 3. No skull fracture or other acute intracranial abnormality identified.   Electronically Signed: By: Marlise Simpers M.D. On: 01/25/2024 12:55  COGNITION: Overall cognitive status: Impaired Areas of impairment:  Attention: Impaired: Selective, Alternating, Divided Memory: Impaired: Short term Awareness: Impaired: Emergent Executive function: Impaired: Problem solving, Organization, Planning, Error awareness, and Self-correction Functional deficits: Pt and wife had difficulty identifying problems, though report he has some difficulty remembering information. Pt reports he has trouble "focusing".   COGNITIVE COMMUNICATION: Following directions: Follows multi-step commands  inconsistently  Auditory comprehension: Impaired: Likely 2/2 to attention Verbal expression: Impaired: Notes some difficulty with expressing thoughts  Functional communication: Impaired: Communicating needs/wants effectively, decreased ability to recall details from conversations   ORAL MOTOR EXAMINATION: Overall status: Did not assess Comments: NA  STANDARDIZED ASSESSMENTS: Initiated CLQT - to complete next session    Cognitive Linguistic Quick Test: AGE - 18 - 69   The Cognitive Linguistic Quick Test (CLQT) was administered to assess the relative status of five cognitive domains: attention, memory, language, executive functioning, and visuospatial skills. Scores from 10 tasks were used to estimate severity ratings (standardized for age groups 18-69 years and 70-89 years) for each domain, a clock drawing task, as well as an overall composite severity rating of cognition.       Task Score Criterion Cut Scores  Personal Facts 8/8 8  Symbol Cancellation 11/12 11  Confrontation Naming 10/10 10  Clock Drawing  5/13 12  Story Retelling 8/10 6  Symbol Trails 2/10 9  Generative Naming 3/9 5  Design Memory 5/6 5  Mazes  4/8 7  Design Generation 4/13 6      Cognitive Domain Composite Score Severity Rating  Attention 151/215 Mild  Memory 157/185 WNL  Executive Function 13/40 Severe  Language 29/37 WNL  Visuospatial Skills 62/105 Mild  Clock Drawing  5/13 Severe  Composite Severity Rating  Mild  PATIENT REPORTED OUTCOME MEASURES (PROM): Cognitive Functioning Neuro QOL: 104/140                                                                                                                            TREATMENT DATE:   02/15/24: Pt was seen for skilled ST services targeting cognitive-communication. Pt reports stress and overwhelm with information provided by his oncologist today. Pt reports he is to begin chemo tomorrow. SLP educated on relaxation exercises via "Relaxation  after BI". Pt verbalized activities that "bring him joy" and keep is mind active. SLP demonstrated use of breathing exercises re: 4:7:8, muscle relaxation, belly breathing, and body scan. Pt reports he would like to try muscle relaxation as a strategy to manage stress. To begin attention strategies next session.   02/10/24: Pt was seen for skilled ST services targeting continued assessment. Pt completed CLQT this session and Cognitive Function PROMs. See above in blue. Pt reports some stress with cognitive function and CA dx. To provide with relaxation strategies. Goals have been updated.    PATIENT EDUCATION: Education details: SLP role in cognitive-communication Person educated: Patient and Spouse Education method: Explanation Education comprehension: verbalized understanding and needs further education   GOALS: Goals reviewed with patient? Yes  SHORT TERM GOALS: Target date: 03/11/24  Pt will complete CLQT and goals will be updated Baseline:MET  2.  Pt will complete Cognitive PROM Baseline:  Goal status: MET  3.  Pt will verbalize 3 memory strategies to be used in recall of important information Baseline:  Goal status: INITIAL  4.  Pt will verbalize 3 strategies to increase focus on cognitive tasks Baseline:  Goal status: INITIAL    LONG TERM GOALS: Target date: 04/10/24  Pt will improve score on PROMS Baseline:  Goal status: INITIAL  2.  Pt/wife will report successful use of memory strategies at home Baseline:  Goal status: INITIAL  3.  Pt/wife will report successful use of attention strategies at home Baseline:  Goal status: INITIAL    ASSESSMENT:  CLINICAL IMPRESSION: Pt is a 68 yo male who presents to ST OP for evaluation post SDH. Pt was accompanied by his wife, [name], to today's assessment. Pt and wife endorse changes in attention and memory - both pt and wife reported they felt he was 75% back to baseline. Pt also reports some sound sensitivity to louder  music. Pt was assessed using CLQT - to complete next session. SLP observed pt requiring repetition of directions and difficulty following multi-step directions, decreased attention, impaired topic maintenance, slowed processing/ability to retrieve words in structured task. SLP rec skilled ST services to address cognitive-communication impairment, as pt reports he would like to return to work.    OBJECTIVE IMPAIRMENTS: include attention, memory, awareness, and executive functioning. These impairments are limiting patient from return to work, managing medications, managing appointments, managing finances, household responsibilities, ADLs/IADLs, and effectively communicating at home and in community. Factors affecting potential to achieve goals and functional outcome  are NA.Aaron Aas Patient will benefit from skilled SLP services to address above impairments and improve overall function.  REHAB POTENTIAL: Good  PLAN:  SLP FREQUENCY: 1-2x/week  SLP DURATION: 8 weeks  PLANNED INTERVENTIONS: Environmental controls, Cueing hierachy, Internal/external aids, Functional tasks, SLP instruction and feedback, Compensatory strategies, Patient/family education, and [address] Treatment of speech (30 or 45 min)     [employer], CCC-SLP 02/15/2024, 11:07 AM

## 2024-02-16 ENCOUNTER — Inpatient Hospital Stay

## 2024-02-16 ENCOUNTER — Encounter: Payer: Self-pay | Admitting: *Deleted

## 2024-02-16 ENCOUNTER — Telehealth: Payer: Self-pay | Admitting: *Deleted

## 2024-02-16 ENCOUNTER — Other Ambulatory Visit: Payer: Self-pay | Admitting: Family

## 2024-02-16 ENCOUNTER — Encounter: Payer: Self-pay | Admitting: Hematology & Oncology

## 2024-02-16 VITALS — BP 132/79 | HR 71 | Temp 98.2°F | Resp 18

## 2024-02-16 DIAGNOSIS — C169 Malignant neoplasm of stomach, unspecified: Secondary | ICD-10-CM

## 2024-02-16 DIAGNOSIS — Z5111 Encounter for antineoplastic chemotherapy: Secondary | ICD-10-CM | POA: Diagnosis not present

## 2024-02-16 DIAGNOSIS — Z79899 Other long term (current) drug therapy: Secondary | ICD-10-CM | POA: Diagnosis not present

## 2024-02-16 DIAGNOSIS — I2510 Atherosclerotic heart disease of native coronary artery without angina pectoris: Secondary | ICD-10-CM | POA: Diagnosis not present

## 2024-02-16 DIAGNOSIS — E782 Mixed hyperlipidemia: Secondary | ICD-10-CM | POA: Diagnosis not present

## 2024-02-16 DIAGNOSIS — Z8616 Personal history of COVID-19: Secondary | ICD-10-CM | POA: Diagnosis not present

## 2024-02-16 DIAGNOSIS — Z87891 Personal history of nicotine dependence: Secondary | ICD-10-CM | POA: Diagnosis not present

## 2024-02-16 DIAGNOSIS — R1310 Dysphagia, unspecified: Secondary | ICD-10-CM | POA: Diagnosis not present

## 2024-02-16 DIAGNOSIS — G4700 Insomnia, unspecified: Secondary | ICD-10-CM | POA: Diagnosis not present

## 2024-02-16 DIAGNOSIS — K219 Gastro-esophageal reflux disease without esophagitis: Secondary | ICD-10-CM | POA: Diagnosis not present

## 2024-02-16 DIAGNOSIS — S065XAA Traumatic subdural hemorrhage with loss of consciousness status unknown, initial encounter: Secondary | ICD-10-CM | POA: Diagnosis not present

## 2024-02-16 DIAGNOSIS — Z5189 Encounter for other specified aftercare: Secondary | ICD-10-CM | POA: Diagnosis not present

## 2024-02-16 DIAGNOSIS — I10 Essential (primary) hypertension: Secondary | ICD-10-CM | POA: Diagnosis not present

## 2024-02-16 DIAGNOSIS — C160 Malignant neoplasm of cardia: Secondary | ICD-10-CM | POA: Diagnosis not present

## 2024-02-16 LAB — CMP (CANCER CENTER ONLY)
ALT: 14 U/L (ref 0–44)
AST: 16 U/L (ref 15–41)
Albumin: 3.9 g/dL (ref 3.5–5.0)
Alkaline Phosphatase: 140 U/L — ABNORMAL HIGH (ref 38–126)
Anion gap: 8 (ref 5–15)
BUN: 16 mg/dL (ref 8–23)
CO2: 25 mmol/L (ref 22–32)
Calcium: 9.4 mg/dL (ref 8.9–10.3)
Chloride: 102 mmol/L (ref 98–111)
Creatinine: 0.86 mg/dL (ref 0.61–1.24)
GFR, Estimated: >60 mL/min (ref >60)
Glucose, Bld: 177 mg/dL — ABNORMAL HIGH (ref 70–99)
Potassium: 4 mmol/L (ref 3.5–5.1)
Sodium: 135 mmol/L (ref 135–145)
Total Bilirubin: 0.4 mg/dL (ref 0.0–1.2)
Total Protein: 6.7 g/dL (ref 6.5–8.1)

## 2024-02-16 LAB — CBC WITH DIFFERENTIAL (CANCER CENTER ONLY)
Abs Immature Granulocytes: 0.02 10*3/uL (ref 0.00–0.07)
Basophils Absolute: 0 10*3/uL (ref 0.0–0.1)
Basophils Relative: 1 %
Eosinophils Absolute: 0.3 10*3/uL (ref 0.0–0.5)
Eosinophils Relative: 3 %
HCT: 34.9 % — ABNORMAL LOW (ref 39.0–52.0)
Hemoglobin: 11.1 g/dL — ABNORMAL LOW (ref 13.0–17.0)
Immature Granulocytes: 0 %
Lymphocytes Relative: 16 %
Lymphs Abs: 1.3 10*3/uL (ref 0.7–4.0)
MCH: 24.4 pg — ABNORMAL LOW (ref 26.0–34.0)
MCHC: 31.8 g/dL (ref 30.0–36.0)
MCV: 76.9 fL — ABNORMAL LOW (ref 80.0–100.0)
Monocytes Absolute: 0.8 10*3/uL (ref 0.1–1.0)
Monocytes Relative: 10 %
Neutro Abs: 5.6 10*3/uL (ref 1.7–7.7)
Neutrophils Relative %: 70 %
Platelet Count: 454 10*3/uL — ABNORMAL HIGH (ref 150–400)
RBC: 4.54 MIL/uL (ref 4.22–5.81)
RDW: 14.7 % (ref 11.5–15.5)
WBC Count: 8 10*3/uL (ref 4.0–10.5)
nRBC: 0 % (ref 0.0–0.2)

## 2024-02-16 MED ORDER — PALONOSETRON HCL INJECTION 0.25 MG/5ML
0.2500 mg | Freq: Once | INTRAVENOUS | Status: AC
Start: 2024-02-16 — End: 2024-02-16
  Administered 2024-02-16: 0.25 mg via INTRAVENOUS
  Filled 2024-02-16: qty 5

## 2024-02-16 MED ORDER — OXALIPLATIN CHEMO INJECTION 100 MG/20ML
76.5000 mg/m2 | Freq: Once | INTRAVENOUS | Status: AC
  Administered 2024-02-16: 150 mg via INTRAVENOUS
  Filled 2024-02-16: qty 20

## 2024-02-16 MED ORDER — DEXTROSE 5 % IV SOLN
INTRAVENOUS | Status: DC
Start: 2024-02-16 — End: 2024-02-16

## 2024-02-16 MED ORDER — SODIUM CHLORIDE 0.9 % IV SOLN
2200.0000 mg/m2 | INTRAVENOUS | Status: DC
  Administered 2024-02-16: 3950 mg via INTRAVENOUS
  Filled 2024-02-16: qty 79

## 2024-02-16 MED ORDER — DEXAMETHASONE SODIUM PHOSPHATE 10 MG/ML IJ SOLN
10.0000 mg | Freq: Once | INTRAMUSCULAR | Status: AC
  Administered 2024-02-16: 10 mg via INTRAVENOUS
  Filled 2024-02-16: qty 1

## 2024-02-16 MED ORDER — SODIUM CHLORIDE 0.9 % IV SOLN: INTRAVENOUS | Status: DC

## 2024-02-16 MED ORDER — LEUCOVORIN CALCIUM INJECTION 350 MG
200.0000 mg/m2 | Freq: Once | INTRAMUSCULAR | Status: DC
  Filled 2024-02-16: qty 17.9

## 2024-02-16 MED ORDER — DEXTROSE 5 % IV SOLN
195.0000 mg/m2 | Freq: Once | INTRAVENOUS | Status: AC
  Administered 2024-02-16: 350 mg via INTRAVENOUS
  Filled 2024-02-16: qty 17.5

## 2024-02-16 MED ORDER — SODIUM CHLORIDE 0.9 % IV SOLN
45.0000 mg/m2 | Freq: Once | INTRAVENOUS | Status: AC
  Administered 2024-02-16: 80 mg via INTRAVENOUS
  Filled 2024-02-16: qty 8

## 2024-02-16 NOTE — Progress Notes (Signed)
 Reviewed patient's baseline PET which shows known disease.   Patient is here today to start treatment. He has had port placed and completed chemo education yesterday.   Oncology Nurse Navigator Documentation     02/16/2024    8:15 AM  Oncology Nurse Navigator Flowsheets  Planned Course of Treatment Neo Chemo  Phase of Treatment Chemotherapy  Chemotherapy Actual Start Date: 02/16/2024  Chemotherapy Expected End Date: 03/29/2024  Navigator Follow Up Date: 03/01/2024  Navigator Follow Up Reason: Follow-up Appointment;Chemotherapy  Navigator Location [facility]  Navigator Encounter Type Treatment;Scan Review  Treatment Initiated Date 02/16/2024  Patient Visit Type MedOnc  Treatment Phase First Chemo Tx  Barriers/Navigation Needs Coordination of Care;Education  Genetic Counseling Type None

## 2024-02-16 NOTE — Telephone Encounter (Signed)
 Schedulers messaged to set up nutrition consult with patient.

## 2024-02-16 NOTE — Patient Instructions (Signed)
 [facility] - A DEPT OF [facility]  Discharge Instructions: Thank you for choosing [facility] to provide your oncology and hematology care.   If you have a lab appointment with the Cancer Center, please go directly to the Cancer Center and check in at the registration area.  Wear comfortable clothing and clothing appropriate for easy access to any Portacath or PICC line.   We strive to give you quality time with your provider. You may need to reschedule your appointment if you arrive late (15 or more minutes).  Arriving late affects you and other patients whose appointments are after yours.  Also, if you miss three or more appointments without notifying the office, you may be dismissed from the clinic at the provider's discretion.      For prescription refill requests, have your pharmacy contact our office and allow 72 hours for refills to be completed.    Today you received the following chemotherapy and/or immunotherapy agents Taxotere, Oxaliplatin, Leukovorin, 5FU      To help prevent nausea and vomiting after your treatment, we encourage you to take your nausea medication as directed.  BELOW ARE SYMPTOMS THAT SHOULD BE REPORTED IMMEDIATELY: *FEVER GREATER THAN 100.4 F (38 C) OR HIGHER *CHILLS OR SWEATING *NAUSEA AND VOMITING THAT IS NOT CONTROLLED WITH YOUR NAUSEA MEDICATION *UNUSUAL SHORTNESS OF BREATH *UNUSUAL BRUISING OR BLEEDING *URINARY PROBLEMS (pain or burning when urinating, or frequent urination) *BOWEL PROBLEMS (unusual diarrhea, constipation, pain near the anus) TENDERNESS IN MOUTH AND THROAT WITH OR WITHOUT PRESENCE OF ULCERS (sore throat, sores in mouth, or a toothache) UNUSUAL [place], SWELLING OR PAIN  UNUSUAL VAGINAL DISCHARGE OR ITCHING   Items with * indicate a potential emergency and should be followed up as soon as possible or go to the Emergency Department if any problems should occur.  Please show the CHEMOTHERAPY  ALERT CARD or IMMUNOTHERAPY ALERT CARD at check-in to the Emergency Department and triage nurse. Should you have questions after your visit or need to cancel or reschedule your appointment, please contact [facility] - A DEPT OF [facility]  [phone] and follow the prompts.  Office hours are 8:00 a.m. to 4:30 p.m. Monday - Friday. Please note that voicemails left after 4:00 p.m. may not be returned until the following business day.  We are closed weekends and major holidays. You have access to a nurse at all times for urgent questions. Please call the main number to the clinic [phone] and follow the prompts.  For any non-urgent questions, you may also contact your provider using MyChart. We now offer e-Visits for anyone 52 and older to request care online for non-urgent symptoms. For details visit [email or web address].   Also download the MyChart app! Go to the app store, search "MyChart", open the app, select [facility], and log in with your MyChart username and password.

## 2024-02-16 NOTE — Telephone Encounter (Signed)
 Patient will need 2 doses of IV Feraheme.  PAtient notified.  Will get dose one with pump dc tomorrow

## 2024-02-16 NOTE — Patient Instructions (Signed)

## 2024-02-17 ENCOUNTER — Ambulatory Visit: Admitting: Speech Pathology

## 2024-02-17 ENCOUNTER — Inpatient Hospital Stay

## 2024-02-17 VITALS — BP 133/72 | HR 78 | Temp 98.2°F | Resp 18

## 2024-02-17 DIAGNOSIS — Z79899 Other long term (current) drug therapy: Secondary | ICD-10-CM | POA: Diagnosis not present

## 2024-02-17 DIAGNOSIS — R1310 Dysphagia, unspecified: Secondary | ICD-10-CM | POA: Diagnosis not present

## 2024-02-17 DIAGNOSIS — C169 Malignant neoplasm of stomach, unspecified: Secondary | ICD-10-CM

## 2024-02-17 DIAGNOSIS — Z5189 Encounter for other specified aftercare: Secondary | ICD-10-CM | POA: Diagnosis not present

## 2024-02-17 DIAGNOSIS — D509 Iron deficiency anemia, unspecified: Secondary | ICD-10-CM

## 2024-02-17 DIAGNOSIS — I10 Essential (primary) hypertension: Secondary | ICD-10-CM | POA: Diagnosis not present

## 2024-02-17 DIAGNOSIS — K219 Gastro-esophageal reflux disease without esophagitis: Secondary | ICD-10-CM | POA: Diagnosis not present

## 2024-02-17 DIAGNOSIS — Z87891 Personal history of nicotine dependence: Secondary | ICD-10-CM | POA: Diagnosis not present

## 2024-02-17 DIAGNOSIS — E782 Mixed hyperlipidemia: Secondary | ICD-10-CM | POA: Diagnosis not present

## 2024-02-17 DIAGNOSIS — Z8616 Personal history of COVID-19: Secondary | ICD-10-CM | POA: Diagnosis not present

## 2024-02-17 DIAGNOSIS — I2510 Atherosclerotic heart disease of native coronary artery without angina pectoris: Secondary | ICD-10-CM | POA: Diagnosis not present

## 2024-02-17 DIAGNOSIS — G4700 Insomnia, unspecified: Secondary | ICD-10-CM | POA: Diagnosis not present

## 2024-02-17 DIAGNOSIS — C160 Malignant neoplasm of cardia: Secondary | ICD-10-CM | POA: Diagnosis not present

## 2024-02-17 DIAGNOSIS — S065XAA Traumatic subdural hemorrhage with loss of consciousness status unknown, initial encounter: Secondary | ICD-10-CM | POA: Diagnosis not present

## 2024-02-17 DIAGNOSIS — Z5111 Encounter for antineoplastic chemotherapy: Secondary | ICD-10-CM | POA: Diagnosis not present

## 2024-02-17 MED ORDER — SODIUM CHLORIDE 0.9% FLUSH
10.0000 mL | Freq: Once | INTRAVENOUS | Status: AC | PRN
  Administered 2024-02-17: 10 mL

## 2024-02-17 MED ORDER — PEGFILGRASTIM-JMDB 6 MG/0.6ML ~~LOC~~ SOSY
6.0000 mg | PREFILLED_SYRINGE | Freq: Once | SUBCUTANEOUS | Status: AC
  Administered 2024-02-17: 6 mg via SUBCUTANEOUS
  Filled 2024-02-17: qty 0.6

## 2024-02-17 MED ORDER — FERUMOXYTOL INJECTION 510 MG/17 ML
510.0000 mg | Freq: Once | INTRAVENOUS | Status: AC
  Administered 2024-02-17: 510 mg via INTRAVENOUS
  Filled 2024-02-17: qty 510

## 2024-02-17 MED ORDER — HEPARIN SOD (PORK) LOCK FLUSH 100 UNIT/ML IV SOLN
500.0000 [IU] | Freq: Once | INTRAVENOUS | Status: AC | PRN
  Administered 2024-02-17: 500 [IU]

## 2024-02-17 MED ORDER — SODIUM CHLORIDE 0.9% FLUSH
10.0000 mL | INTRAVENOUS | Status: DC | PRN
Start: 2024-02-17 — End: 2024-02-17
  Administered 2024-02-17: 10 mL

## 2024-02-17 MED ORDER — SODIUM CHLORIDE 0.9 % IV SOLN: INTRAVENOUS | Status: DC

## 2024-02-17 NOTE — Patient Instructions (Addendum)
 Ferumoxytol  Injection What is this medication? FERUMOXYTOL  (FER ue MOX i tol) treats low levels of iron in your body (iron deficiency anemia). Iron is a mineral that plays an important role in making red blood cells, which carry oxygen from your lungs to the rest of your body. This medicine may be used for other purposes; ask your health care provider or pharmacist if you have questions. COMMON BRAND NAME(S): Feraheme What should I tell my care team before I take this medication? They need to know if you have any of these conditions: Anemia not caused by low iron levels High levels of iron in the blood Magnetic resonance imaging (MRI) test scheduled An unusual or allergic reaction to iron, other medications, foods, dyes, or preservatives Pregnant or trying to get pregnant Breastfeeding How should I use this medication? This medication is injected into a vein. It is given by your care team in a hospital or clinic setting. Talk to your care team the use of this medication in children. Special care may be needed. Overdosage: If you think you have taken too much of this medicine contact a poison control center or emergency room at once. NOTE: This medicine is only for you. Do not share this medicine with others. What if I miss a dose? It is important not to miss your dose. Call your care team if you are unable to keep an appointment. What may interact with this medication? Other iron products This list may not describe all possible interactions. Give your health care provider a list of all the medicines, herbs, non-prescription drugs, or dietary supplements you use. Also tell them if you smoke, drink alcohol, or use illegal drugs. Some items may interact with your medicine. What should I watch for while using this medication? Visit your care team for regular checks on your progress. Tell your care team if your symptoms do not start to get better or if they get worse. You may need blood work done  while you are taking this medication. You may need to eat more foods that contain iron. Talk to your care team. Foods that contain iron include whole grains or cereals, dried fruits, beans, peas, leafy green vegetables, and organ meats (liver, kidney). What side effects may I notice from receiving this medication? Side effects that you should report to your care team as soon as possible: Allergic reactions--skin Flippen, itching, hives, swelling of the face, lips, tongue, or throat Low blood pressure--dizziness, feeling faint or lightheaded, blurry vision Shortness of breath Side effects that usually do not require medical attention (report to your care team if they continue or are bothersome): Flushing Headache Joint pain Muscle pain Nausea Pain, redness, or irritation at injection site This list may not describe all possible side effects. Call your doctor for medical advice about side effects. You may report side effects to FDA at 1-800-FDA-1088. Where should I keep my medication? This medication is given in a hospital or clinic. It will not be stored at home. NOTE: This sheet is a summary. It may not cover all possible information. If you have questions about this medicine, talk to your doctor, pharmacist, or health care provider.  2024 Elsevier/Gold Standard (2023-05-05 00:00:00) Pegfilgrastim  Injection What is this medication? PEGFILGRASTIM  (PEG fil gra stim) lowers the risk of infection in people who are receiving chemotherapy. It works by Systems analyst make more white blood cells, which protects your body from infection. It may also be used to help people who have been exposed to  high doses of radiation. This medicine may be used for other purposes; ask your health care provider or pharmacist if you have questions. COMMON BRAND NAME(S): Fulphila, Fylnetra, Neulasta, Nyvepria, Stimufend, UDENYCA, UDENYCA ONBODY, Ziextenzo What should I tell my care team before I take this  medication? They need to know if you have any of these conditions: Kidney disease Latex allergy Ongoing radiation therapy Sickle cell disease Skin reactions to acrylic adhesives (On-Body Injector only) An unusual or allergic reaction to pegfilgrastim, filgrastim, other medications, foods, dyes, or preservatives Pregnant or trying to get pregnant Breast-feeding How should I use this medication? This medication is for injection under the skin. If you get this medication at home, you will be taught how to prepare and give the pre-filled syringe or how to use the On-body Injector. Refer to the patient Instructions for Use for detailed instructions. Use exactly as directed. Tell your care team immediately if you suspect that the On-body Injector may not have performed as intended or if you suspect the use of the On-body Injector resulted in a missed or partial dose. It is important that you put your used needles and syringes in a special sharps container. Do not put them in a trash can. If you do not have a sharps container, call your pharmacist or care team to get one. Talk to your care team about the use of this medication in children. While this medication may be prescribed for selected conditions, precautions do apply. Overdosage: If you think you have taken too much of this medicine contact a poison control center or emergency room at once. NOTE: This medicine is only for you. Do not share this medicine with others. What if I miss a dose? It is important not to miss your dose. Call your care team if you miss your dose. If you miss a dose due to an On-body Injector failure or leakage, a new dose should be administered as soon as possible using a single prefilled syringe for manual use. What may interact with this medication? Interactions have not been studied. This list may not describe all possible interactions. Give your health care provider a list of all the medicines, herbs, non-prescription  drugs, or dietary supplements you use. Also tell them if you smoke, drink alcohol, or use illegal drugs. Some items may interact with your medicine. What should I watch for while using this medication? Your condition will be monitored carefully while you are receiving this medication. You may need blood work done while you are taking this medication. Talk to your care team about your risk of cancer. You may be more at risk for certain types of cancer if you take this medication. If you are going to need a MRI, CT scan, or other procedure, tell your care team that you are using this medication (On-Body Injector only). What side effects may I notice from receiving this medication? Side effects that you should report to your care team as soon as possible: Allergic reactions--skin [place]engert, itching, hives, swelling of the face, lips, tongue, or throat Capillary leak syndrome--stomach or muscle pain, unusual weakness or fatigue, feeling faint or lightheaded, decrease in the amount of urine, swelling of the ankles, hands, or feet, trouble breathing High white blood cell level--fever, fatigue, trouble breathing, night sweats, change in vision, weight loss Inflammation of the aorta--fever, fatigue, back, chest, or stomach pain, severe headache Kidney injury (glomerulonephritis)--decrease in the amount of urine, red or dark brown urine, foamy or bubbly urine, swelling of the ankles,  hands, or feet Shortness of breath or trouble breathing Spleen injury--pain in upper left stomach or shoulder Unusual bruising or bleeding Side effects that usually do not require medical attention (report to your care team if they continue or are bothersome): Bone pain Pain in the hands or feet This list may not describe all possible side effects. Call your doctor for medical advice about side effects. You may report side effects to FDA at 1-800-FDA-1088. Where should I keep my medication? Keep out of the reach of children. If  you are using this medication at home, you will be instructed on how to store it. Throw away any unused medication after the expiration date on the label. NOTE: This sheet is a summary. It may not cover all possible information. If you have questions about this medicine, talk to your doctor, pharmacist, or health care provider.  2024 Elsevier/Gold Standard (2021-08-15 00:00:00)

## 2024-02-17 NOTE — Progress Notes (Signed)
 [address]       [place] [address]             [phone]                    Alastor Kneale Manwarren Duncombe Medical Record #[id number] Date of Birth: [birth year]  Referring: Nannette Babe, MD Primary Care: Dorrene Gaucher, NP Primary Cardiologist: None  Chief Complaint:    Chief Complaint  Patient presents with   Esophageal Cancer    New patient consult, review all studies    History of Present Illness:    Benjamen Koelling Redner 68 y.o. male presents for surgical evaluation of a gastric carcinoma.  This was found incidentally after having some dysphagia.  He underwent an upper endoscopy which identified a fungating mass with bleeding at the GE junction and cardia.  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  He subsequently underwent formal evaluation which included a PET scan which identified some avid lymph nodes in his right axilla.  These were biopsied were negative.  He then underwent an endoscopic ultrasound which showed that the tumor was originating mostly from the stomach.  He initially lost about 10 pounds but has been able to continue to eat and gained back this weight.       Zubrod Score: At the time of surgery this patient's most appropriate activity status/level should be described as: [x]     0    Normal activity, no symptoms []     1    Restricted in physical strenuous activity but ambulatory, able to do out light work []     2    Ambulatory and capable of self care, unable to do work activities, up and about               >50 % of waking hours                              []     3    Only limited self care, in bed greater than 50% of waking hours []     4    Completely disabled, no self care, confined to bed or chair []     5    Moribund   Past Medical History:  Diagnosis Date   Abnormal weight loss 11/02/2022   Aneurysm of thoracic aorta (HCC) 09/2022   dilated to 4 cm on CT   Cholelithiasis 11/02/2022   Colon cancer screening  07/31/2022   Coronary artery calcification 10/08/2022   Diverticulitis    Dyspnea on exertion 11/11/2022   Elevated PSA 07/31/2022   Essential hypertension 09/26/2009   Qualifier: Diagnosis of   By: Luster Salters MD, Sheilda Deputy 11/11/2022   Gall bladder polyp 02/18/2015   Overview:   - 6 mm (as described from US  abd in 2015). Repeat Us  abd lim ordered.   GERD (gastroesophageal reflux disease) 09/07/2013   Hepatitis C 08/24/2012   TX and cured per pt   History of COVID-19 07/31/2022   History of hepatitis C 11/11/2022   History of tobacco abuse    Hyperglycemia 11/22/2014   Hypertension    long standing since 2007, was treated for sometime, but then discontinued taking meds on his own.   Immunization counseling 04/01/2022   Insomnia 10/31/2009   Qualifier: Diagnosis of   By: Evlyn Hoffmann MD, Amanjot  Mild diastolic dysfunction 08/22/2012   Mixed dyslipidemia 11/11/2022   Preventative health care 12/31/2014   Prominent abdominal aortic pulsation 11/11/2022   Pulmonary edema, acute (HCC)    hospitalized in 09/2009 with hypertensive crisis and acute pulmonary edema- improved with lasix  nad labetalol .    Past Surgical History:  Procedure Laterality Date   CRANIOTOMY Right 01/25/2024   Procedure: CRANIOTOMY HEMATOMA EVACUATION SUBDURAL;  Surgeon: Van Gelinas, MD;  Location: [facility] OR;  Service: Neurosurgery;  Laterality: Right;   ESOPHAGOGASTRODUODENOSCOPY  08/21/2011   Procedure: ESOPHAGOGASTRODUODENOSCOPY (EGD);  Surgeon: Tami Falcon, MD;  Location: [facility];  Service: Endoscopy;  Laterality: N/A;   ESOPHAGOGASTRODUODENOSCOPY N/A 02/07/2024   Procedure: EGD (ESOPHAGOGASTRODUODENOSCOPY);  Surgeon: Normie Becton., MD;  Location: [facility];  Service: Gastroenterology;  Laterality: N/A;   EUS N/A 02/07/2024   Procedure: ULTRASOUND, UPPER GI TRACT, ENDOSCOPIC;  Surgeon: Brice Campi Albino Alu., MD;  Location: [facility];  Service: Gastroenterology;  Laterality: N/A;    IR IMAGING GUIDED PORT INSERTION  02/14/2024   IR US  LIVER BIOPSY  01/28/2024   TONSILLECTOMY      Family History  Problem Relation Age of Onset   Polycystic kidney disease Father    Polycystic kidney disease Half-Brother    Polycystic kidney disease Half-Brother    Polycystic kidney disease Half-Sister    Colon cancer Neg Hx    Esophageal cancer Neg Hx    Stomach cancer Neg Hx    Pancreatic cancer Neg Hx      Social History   Tobacco Use  Smoking Status Former   Current packs/day: 0.00   Average packs/day: 1 pack/day for 40.0 years (40.0 ttl pk-yrs)   Types: Cigarettes   Start date: 09/23/1969   Quit date: 09/23/2009   Years since quitting: 14.4  Smokeless Tobacco Never    Social History   Substance and Sexual Activity  Alcohol Use Not Currently   Comment: Former drinker.     No Known Allergies  Current Outpatient Medications  Medication Sig Dispense Refill   amLODipine  (NORVASC ) 10 MG tablet TAKE 1 TABLET DAILY 90 tablet 3   atorvastatin  (LIPITOR) 10 MG tablet Take 1 tablet (10 mg total) by mouth daily. 90 tablet 3   dexamethasone  (DECADRON ) 4 MG tablet Take 2 tablets (8 mg total) by mouth daily. Start the day after chemotherapy for 2 days. Take with food. 30 tablet 1   ferrous sulfate  325 (65 FE) MG tablet Take 1 tablet (325 mg total) by mouth every other day.     labetalol  (NORMODYNE ) 100 MG tablet Take 1.5 tablets (150 mg total) by mouth in the morning and at bedtime. 21 tablet 0   levETIRAcetam  (KEPPRA ) 500 MG tablet Take 1 tablet (500 mg total) by mouth 2 (two) times daily. 60 tablet 2   losartan  (COZAAR ) 50 MG tablet Take 1 tablet (50 mg total) by mouth daily.     ondansetron  (ZOFRAN ) 8 MG tablet Take 1 tablet (8 mg total) by mouth every 8 (eight) hours as needed for nausea or vomiting. Start on the third day after chemotherapy. 30 tablet 1   pantoprazole  (PROTONIX ) 40 MG tablet Take 1 tablet (40 mg total) by mouth 2 (two) times daily. 60 tablet 2    polyethylene glycol (MIRALAX  / GLYCOLAX ) 17 g packet Take 17 g by mouth daily as needed for mild constipation. 14 each 0   prochlorperazine  (COMPAZINE ) 10 MG tablet Take 1 tablet (10 mg total) by mouth every 6 (six) hours as needed for nausea  or vomiting. 30 tablet 1   zolpidem  (AMBIEN ) 5 MG tablet Take 1 tablet (5 mg total) by mouth at bedtime as needed for sleep. 30 tablet 0   No current facility-administered medications for this visit.    Review of Systems  Constitutional:  Positive for weight loss.  Respiratory: Negative.    Cardiovascular:  Negative for chest pain.  Gastrointestinal:  Positive for heartburn.  Neurological: Negative.      PHYSICAL EXAMINATION: BP 122/71 (BP Location: Right Arm, Patient Position: Sitting, Cuff Size: Normal)   Pulse 81   Resp 20   Ht 5\' 7"  (1.702 m)   Wt 151 lb 4.8 oz (68.6 kg)   SpO2 95% Comment: RA  BMI 23.70 kg/m  Physical Exam Constitutional:      General: He is not in acute distress.    Appearance: He is ill-appearing.  HENT:     Head: Normocephalic and atraumatic.  Eyes:     Extraocular Movements: Extraocular movements intact.  Cardiovascular:     Rate and Rhythm: Normal rate.  Pulmonary:     Effort: Pulmonary effort is normal. No respiratory distress.  Abdominal:     General: Abdomen is flat. There is no distension.  Musculoskeletal:        General: Normal range of motion.     Cervical back: Normal range of motion.  Skin:    General: Skin is warm and dry.  Neurological:     General: No focal deficit present.     Mental Status: He is alert and oriented to person, place, and time.       I have independently reviewed the above radiology studies  and reviewed the findings with the patient.   Recent Lab Findings: Lab Results  Component Value Date   WBC 8.0 02/16/2024   HGB 11.1 (L) 02/16/2024   HCT 34.9 (L) 02/16/2024   PLT 454 (H) 02/16/2024   GLUCOSE 177 (H) 02/16/2024   CHOL 172 12/15/2023   TRIG 79.0 12/15/2023    HDL 44.00 12/15/2023   LDLCALC 112 (H) 12/15/2023   ALT 14 02/16/2024   AST 16 02/16/2024   NA 135 02/16/2024   K 4.0 02/16/2024   CL 102 02/16/2024   CREATININE 0.86 02/16/2024   BUN 16 02/16/2024   CO2 25 02/16/2024   TSH 0.96 12/12/2018   INR 1.0 01/25/2024   HGBA1C 5.8 07/31/2022        Assessment / Plan:   68 year old male with gastric intramucosal carcinoma T3 N1 M0.  The tumor arises 41 to 49 cm from the incisors.  There is invasion through the muscularis propria.  Into the serosa.  There is no abnormalities noted within the thoracic esophagus.  He also had biopsies of his axillary nodes since they were avid on PET/CT, but these were negative for metastasis.  I have made a referral to surgical oncology.  Given its proximity to the GE junction there is a question as to whether reconstruction will need to be performed to the thoracic esophagus.  He is currently started his neoadjuvant therapy.  I will continue to follow-up with him and be available for combined procedure if thoracic anastomosis is required.     I  spent 40 minutes with the patient face to face counseling and coordination of care.    Hilarie Lovely 02/18/2024 3:07 PM

## 2024-02-18 ENCOUNTER — Encounter: Payer: Self-pay | Admitting: *Deleted

## 2024-02-18 ENCOUNTER — Encounter: Payer: Self-pay | Admitting: Hematology & Oncology

## 2024-02-18 ENCOUNTER — Encounter: Payer: Self-pay | Admitting: Thoracic Surgery (Cardiothoracic Vascular Surgery)

## 2024-02-18 ENCOUNTER — Ambulatory Visit
Attending: Thoracic Surgery (Cardiothoracic Vascular Surgery) | Admitting: Thoracic Surgery (Cardiothoracic Vascular Surgery)

## 2024-02-18 VITALS — BP 122/71 | HR 81 | Resp 20 | Ht 67.0 in | Wt 151.3 lb

## 2024-02-18 DIAGNOSIS — C160 Malignant neoplasm of cardia: Secondary | ICD-10-CM | POA: Diagnosis not present

## 2024-02-18 NOTE — Progress Notes (Signed)
 See MyChart communication dated 02/18/2024  Oncology Nurse Navigator Documentation     02/18/2024   11:45 AM  Oncology Nurse Navigator Flowsheets  Navigator Follow Up Date: 03/01/2024  Navigator Follow Up Reason: Follow-up Appointment;Chemotherapy  Financial risk analyst Encounter Type MyChart  Patient Visit Type MedOnc  Treatment Phase Active Tx  Barriers/Navigation Needs Coordination of Care;Education  Education Other  Interventions Education  Acuity Level 2-Minimal Needs (1-2 Barriers Identified)  Education Method Written  Support Groups/Services Friends and Family  Time Spent with Patient 15

## 2024-02-20 ENCOUNTER — Other Ambulatory Visit: Payer: Self-pay

## 2024-02-22 ENCOUNTER — Encounter: Payer: Self-pay | Admitting: Speech Pathology

## 2024-02-22 ENCOUNTER — Encounter: Payer: Self-pay | Admitting: Internal Medicine

## 2024-02-22 ENCOUNTER — Encounter: Payer: Self-pay | Admitting: Family

## 2024-02-22 ENCOUNTER — Ambulatory Visit: Admitting: Speech Pathology

## 2024-02-22 ENCOUNTER — Encounter: Payer: Self-pay | Admitting: Hematology & Oncology

## 2024-02-22 DIAGNOSIS — R41841 Cognitive communication deficit: Secondary | ICD-10-CM | POA: Diagnosis not present

## 2024-02-22 NOTE — Therapy (Signed)
 OUTPATIENT SPEECH LANGUAGE PATHOLOGY TREATMENT   Patient Name: Matthew Lee MRN: [id number] DOB:26-Feb-1956, 68 y.o., male Today's Date: 02/22/2024  PCP: Dorrene Gaucher, NP REFERRING PROVIDER: Star East, PA-C  END OF SESSION:  End of Session - 02/22/24 0943     Visit Number 4    Number of Visits 17    Date for SLP Re-Evaluation 04/10/24    SLP Start Time 0935    SLP Stop Time  1013    SLP Time Calculation (min) 38 min    Activity Tolerance Patient tolerated treatment well             Past Medical History:  Diagnosis Date   Abnormal weight loss 11/02/2022   Aneurysm of thoracic aorta (HCC) 09/2022   dilated to 4 cm on CT   Cholelithiasis 11/02/2022   Colon cancer screening 07/31/2022   Coronary artery calcification 10/08/2022   Diverticulitis    Dyspnea on exertion 11/11/2022   Elevated PSA 07/31/2022   Essential hypertension 09/26/2009   Qualifier: Diagnosis of   By: Luster Salters MD, Sheilda Deputy 11/11/2022   Gall bladder polyp 02/18/2015   Overview:   - 6 mm (as described from US  abd in 2015). Repeat Us  abd lim ordered.   GERD (gastroesophageal reflux disease) 09/07/2013   Hepatitis C 08/24/2012   TX and cured per pt   History of COVID-19 07/31/2022   History of hepatitis C 11/11/2022   History of tobacco abuse    Hyperglycemia 11/22/2014   Hypertension    long standing since 2007, was treated for sometime, but then discontinued taking meds on his own.   Immunization counseling 04/01/2022   Insomnia 10/31/2009   Qualifier: Diagnosis of   By: Evlyn Hoffmann MD, Amanjot       Mild diastolic dysfunction 08/22/2012   Mixed dyslipidemia 11/11/2022   Preventative health care 12/31/2014   Prominent abdominal aortic pulsation 11/11/2022   Pulmonary edema, acute (HCC)    hospitalized in 09/2009 with hypertensive crisis and acute pulmonary edema- improved with lasix  nad labetalol .   Past Surgical History:  Procedure Laterality Date   CRANIOTOMY Right  01/25/2024   Procedure: CRANIOTOMY HEMATOMA EVACUATION SUBDURAL;  Surgeon: Van Gelinas, MD;  [facility];  Service: Neurosurgery;  Laterality: Right;   ESOPHAGOGASTRODUODENOSCOPY  08/21/2011   Procedure: ESOPHAGOGASTRODUODENOSCOPY (EGD);  Surgeon: Tami Falcon, MD;  Location: [facility] ENDOSCOPY;  Service: Endoscopy;  Laterality: N/A;   ESOPHAGOGASTRODUODENOSCOPY N/A 02/07/2024   Procedure: EGD (ESOPHAGOGASTRODUODENOSCOPY);  Surgeon: Normie Becton., MD;  Location: [facility];  Service: Gastroenterology;  Laterality: N/A;   EUS N/A 02/07/2024   Procedure: ULTRASOUND, UPPER GI TRACT, ENDOSCOPIC;  Surgeon: Brice Campi Albino Alu., MD;  Location: [facility]COPY;  Service: Gastroenterology;  Laterality: N/A;   IR IMAGING GUIDED PORT INSERTION  02/14/2024   IR US  LIVER BIOPSY  01/28/2024   TONSILLECTOMY     Patient Active Problem List   Diagnosis Date Noted   Gastric adenocarcinoma (HCC) 02/07/2024   Enlarged lymph nodes 02/07/2024   GE junction carcinoma (HCC) 02/04/2024   Pulmonary nodule 02/04/2024   Subdural hematoma (HCC) 01/25/2024   Iron deficiency anemia 12/18/2023   Ex-smoker 11/11/2022   History of hepatitis C 11/11/2022   Mixed dyslipidemia 11/11/2022   Dyspnea on exertion 11/11/2022   Prominent abdominal aortic pulsation 11/11/2022   Diverticulitis 11/02/2022   Pulmonary edema, acute (HCC) 11/02/2022   Abnormal weight loss 11/02/2022   Cholelithiasis 11/02/2022   Coronary artery calcification 10/08/2022  Aneurysm of thoracic aorta (HCC) 09/2022   Elevated PSA 07/31/2022   History of COVID-19 07/31/2022   Colon cancer screening 07/31/2022   Immunization counseling 04/01/2022   Gall bladder polyp 02/18/2015   Preventative health care 12/31/2014   Hyperglycemia 11/22/2014   GERD (gastroesophageal reflux disease) 09/07/2013   Hepatitis C 08/24/2012   Mild diastolic dysfunction 08/22/2012   Insomnia 10/31/2009   History of tobacco abuse 09/26/2009   Hypertension  09/26/2009    ONSET DATE: 01/25/24   REFERRING DIAG: Z61.5XAA (ICD-10-CM) - Subdural hematoma (HCC)  THERAPY DIAG:  Cognitive communication deficit  Rationale for Evaluation and Treatment: Rehabilitation  SUBJECTIVE:   SUBJECTIVE STATEMENT: Pt reports chemo begins tomorrow.  Pt accompanied by: self  PERTINENT HISTORY: Per chart review:  PMHx significant for HTN, HLD, CAD, HFpEF, thoracic aortic aneurysm, chronic HCV s/p treatment, IDA, SDH  PAIN:  Are you having pain? No  FALLS: Has patient fallen in last 6 months?  Yes; 1 (after this hospital stay)   LIVING ENVIRONMENT: Lives with: lives with their spouse and and two grandsons [name])  Lives in: House/apartment  PLOF:  Level of assistance: Independent with ADLs, Independent with IADLs Employment: Full-time employment - goal is to return to [employer] employment   PATIENT GOALS: memory, attention/focus   OBJECTIVE:  Note: Objective measures were completed at Evaluation unless otherwise noted.  DIAGNOSTIC FINDINGS: Per chart review:   CT HEAD WO CONTRAST 01/25/2024  IMPRESSION: 1. Large and mostly isodense Right Subdural Hematoma, estimated up to 23 mm thickness, and probably septated. 2. Intracranial mass effect with leftward midline shift of 9 mm. 3. No skull fracture or other acute intracranial abnormality identified.   Electronically Signed: By: Marlise Simpers M.D. On: 01/25/2024 12:55  COGNITION: Overall cognitive status: Impaired Areas of impairment:  Attention: Impaired: Selective, Alternating, Divided Memory: Impaired: Short term Awareness: Impaired: Emergent Executive function: Impaired: Problem solving, Organization, Planning, Error awareness, and Self-correction Functional deficits: Pt and wife had difficulty identifying problems, though report he has some difficulty remembering information. Pt reports he has trouble "focusing".   COGNITIVE COMMUNICATION: Following directions: Follows multi-step commands  inconsistently  Auditory comprehension: Impaired: Likely 2/2 to attention Verbal expression: Impaired: Notes some difficulty with expressing thoughts  Functional communication: Impaired: Communicating needs/wants effectively, decreased ability to recall details from conversations   ORAL MOTOR EXAMINATION: Overall status: Did not assess Comments: NA  STANDARDIZED ASSESSMENTS: Initiated CLQT - to complete next session    Cognitive Linguistic Quick Test: AGE - 18 - 69   The Cognitive Linguistic Quick Test (CLQT) was administered to assess the relative status of five cognitive domains: attention, memory, language, executive functioning, and visuospatial skills. Scores from 10 tasks were used to estimate severity ratings (standardized for age groups 18-69 years and 70-89 years) for each domain, a clock drawing task, as well as an overall composite severity rating of cognition.       Task Score Criterion Cut Scores  Personal Facts 8/8 8  Symbol Cancellation 11/12 11  Confrontation Naming 10/10 10  Clock Drawing  5/13 12  Story Retelling 8/10 6  Symbol Trails 2/10 9  Generative Naming 3/9 5  Design Memory 5/6 5  Mazes  4/8 7  Design Generation 4/13 6      Cognitive Domain Composite Score Severity Rating  Attention 151/215 Mild  Memory 157/185 WNL  Executive Function 13/40 Severe  Language 29/37 WNL  Visuospatial Skills 62/105 Mild  Clock Drawing  5/13 Severe  Composite Severity Rating  Mild  PATIENT REPORTED OUTCOME MEASURES (PROM): Cognitive Functioning Neuro QOL: 104/140                                                                                                                            TREATMENT DATE:   02/22/24: Pt was seen for skilled ST services targeting cognitive-communication. Pt reports returning to work and driving. SLP encouraged pt to speak with his neurologist to get "clearance" for those tasks. Pt reports he has not heard from neurologist, does not  have an upcoming appt and still needs to have his staples removed. Pt reports he is to make an appt with their office. Pt feels he does not have any cognitive deficits at this time - SLP reviewed his CLQT which identified impairments. We decided to place him on "hold" where pt can attempt to increase awareness of deficits as he begins to be challenged more, cognitively. Pt is in agreement. To place on hold for one month. SLP provided him with "d/c" packet which holds information on impairments to look for.    02/15/24: Pt was seen for skilled ST services targeting cognitive-communication. Pt reports stress and overwhelm with information provided by his oncologist today. Pt reports he is to begin chemo tomorrow. SLP educated on relaxation exercises via "Relaxation after BI". Pt verbalized activities that "bring him joy" and keep is mind active. SLP demonstrated use of breathing exercises re: 4:7:8, muscle relaxation, belly breathing, and body scan. Pt reports he would like to try muscle relaxation as a strategy to manage stress. To begin attention strategies next session.   02/10/24: Pt was seen for skilled ST services targeting continued assessment. Pt completed CLQT this session and Cognitive Function PROMs. See above in blue. Pt reports some stress with cognitive function and CA dx. To provide with relaxation strategies. Goals have been updated.    PATIENT EDUCATION: Education details: SLP role in cognitive-communication Person educated: Patient and Spouse Education method: Explanation Education comprehension: verbalized understanding and needs further education   GOALS: Goals reviewed with patient? Yes  SHORT TERM GOALS: Target date: 03/11/24  Pt will complete CLQT and goals will be updated Baseline:MET  2.  Pt will complete Cognitive PROM Baseline:  Goal status: MET  3.  Pt will verbalize 3 memory strategies to be used in recall of important information Baseline:  Goal status:  INITIAL  4.  Pt will verbalize 3 strategies to increase focus on cognitive tasks Baseline:  Goal status: INITIAL    LONG TERM GOALS: Target date: 04/10/24  Pt will improve score on PROMS Baseline:  Goal status: INITIAL  2.  Pt/wife will report successful use of memory strategies at home Baseline:  Goal status: INITIAL  3.  Pt/wife will report successful use of attention strategies at home Baseline:  Goal status: INITIAL    ASSESSMENT:  CLINICAL IMPRESSION: Pt is a 68 yo male who presents to ST OP for evaluation post SDH. See tx note. SLP rec skilled ST  services to address cognitive-communication impairment, as pt reports he would like to return to work.   **PLACING ON HOLD FOR ONE MONTH    OBJECTIVE IMPAIRMENTS: include attention, memory, awareness, and executive functioning. These impairments are limiting patient from return to work, managing medications, managing appointments, managing finances, household responsibilities, ADLs/IADLs, and effectively communicating at home and in community. Factors affecting potential to achieve goals and functional outcome are NA.Aaron Aas Patient will benefit from skilled SLP services to address above impairments and improve overall function.  REHAB POTENTIAL: Good  PLAN:  SLP FREQUENCY: 1-2x/week  SLP DURATION: 8 weeks  PLANNED INTERVENTIONS: Environmental controls, Cueing hierachy, Internal/external aids, Functional tasks, SLP instruction and feedback, Compensatory strategies, Patient/family education, and [address] Treatment of speech (30 or 45 min)     [employer], CCC-SLP 02/22/2024, 9:44 AM

## 2024-02-23 ENCOUNTER — Other Ambulatory Visit: Payer: Self-pay

## 2024-02-23 ENCOUNTER — Inpatient Hospital Stay: Admitting: Dietician

## 2024-02-23 ENCOUNTER — Telehealth: Payer: Self-pay | Admitting: Dietician

## 2024-02-23 NOTE — Telephone Encounter (Signed)
 Attempted to reach patient for a scheduled remote nutrition consult. Woman picked up home telephone# and said he was at work now. Provided my cell# in a text to his mobile to return call for his nutrition consult or to reschedule.  Carleen Chary, RDN, LDN Registered Dietitian, [facility] Part Time Remote (Usual office hours: Tuesday-Thursday) Cell: [phone]

## 2024-02-24 ENCOUNTER — Ambulatory Visit: Admitting: Speech Pathology

## 2024-02-24 ENCOUNTER — Encounter: Payer: Self-pay | Admitting: Hematology & Oncology

## 2024-02-27 ENCOUNTER — Other Ambulatory Visit: Payer: Self-pay | Admitting: Family

## 2024-02-28 ENCOUNTER — Encounter: Payer: Self-pay | Admitting: Family

## 2024-02-28 NOTE — Telephone Encounter (Signed)
 Spoke with pt and scheduled OV with Trenton Frock to remove staples for 02/29/24 at 2:40pm.

## 2024-02-29 ENCOUNTER — Ambulatory Visit (INDEPENDENT_AMBULATORY_CARE_PROVIDER_SITE_OTHER): Admitting: Physician Assistant

## 2024-02-29 ENCOUNTER — Encounter: Payer: Self-pay | Admitting: Physician Assistant

## 2024-02-29 ENCOUNTER — Ambulatory Visit: Admitting: Speech Pathology

## 2024-02-29 ENCOUNTER — Other Ambulatory Visit: Payer: Self-pay

## 2024-02-29 VITALS — BP 119/71 | HR 90 | Ht 67.0 in | Wt 150.6 lb

## 2024-02-29 DIAGNOSIS — Z4802 Encounter for removal of sutures: Secondary | ICD-10-CM | POA: Diagnosis not present

## 2024-02-29 DIAGNOSIS — Z8679 Personal history of other diseases of the circulatory system: Secondary | ICD-10-CM

## 2024-02-29 DIAGNOSIS — C160 Malignant neoplasm of cardia: Secondary | ICD-10-CM

## 2024-02-29 DIAGNOSIS — Z9889 Other specified postprocedural states: Secondary | ICD-10-CM

## 2024-02-29 DIAGNOSIS — C169 Malignant neoplasm of stomach, unspecified: Secondary | ICD-10-CM

## 2024-02-29 NOTE — Progress Notes (Signed)
 Established patient visit   Patient: Matthew Lee   DOB: November 28, 1955   68 y.o. Male  MRN: [id number] Visit Date: 02/29/2024  Today's healthcare provider: Trenton Frock, PA-C   Cc. Staple removal  Subjective     Pt presents for suture removal post crainotomy and hematoma evacuation 4/29 Denies pain, reports itching.  Medications: Outpatient Medications Prior to Visit  Medication Sig   amLODipine  (NORVASC ) 10 MG tablet TAKE 1 TABLET DAILY   atorvastatin  (LIPITOR) 10 MG tablet Take 1 tablet (10 mg total) by mouth daily.   dexamethasone  (DECADRON ) 4 MG tablet Take 2 tablets (8 mg total) by mouth daily. Start the day after chemotherapy for 2 days. Take with food.   ferrous sulfate  325 (65 FE) MG tablet Take 1 tablet (325 mg total) by mouth every other day.   labetalol  (NORMODYNE ) 100 MG tablet TAKE ONE AND ONE-HALF TABLETS IN THE MORNING AND AT BEDTIME   levETIRAcetam  (KEPPRA ) 500 MG tablet Take 1 tablet (500 mg total) by mouth 2 (two) times daily.   losartan  (COZAAR ) 50 MG tablet Take 1 tablet (50 mg total) by mouth daily.   ondansetron  (ZOFRAN ) 8 MG tablet Take 1 tablet (8 mg total) by mouth every 8 (eight) hours as needed for nausea or vomiting. Start on the third day after chemotherapy.   pantoprazole  (PROTONIX ) 40 MG tablet Take 1 tablet (40 mg total) by mouth 2 (two) times daily.   polyethylene glycol (MIRALAX  / GLYCOLAX ) 17 g packet Take 17 g by mouth daily as needed for mild constipation.   prochlorperazine  (COMPAZINE ) 10 MG tablet Take 1 tablet (10 mg total) by mouth every 6 (six) hours as needed for nausea or vomiting.   zolpidem  (AMBIEN ) 5 MG tablet Take 1 tablet (5 mg total) by mouth at bedtime as needed for sleep.   No facility-administered medications prior to visit.    Review of Systems  Constitutional:  Negative for fatigue and fever.  Respiratory:  Negative for cough and shortness of breath.   Cardiovascular:  Negative for chest pain, palpitations and leg  swelling.  Neurological:  Negative for dizziness and headaches.       Objective    BP 119/71   Pulse 90   Ht 5\' 7"  (1.702 m)   Wt 150 lb 9.6 oz (68.3 kg)   BMI 23.59 kg/m    Physical Exam Vitals reviewed.  Constitutional:      Appearance: He is not ill-appearing.  HENT:     Head: Normocephalic.  Eyes:     Conjunctiva/sclera: Conjunctivae normal.  Cardiovascular:     Rate and Rhythm: Normal rate.  Pulmonary:     Effort: Pulmonary effort is normal. No respiratory distress.  Skin:    Comments: Staples present across scalp no erythema, drainage, bleeding  Neurological:     Mental Status: He is alert and oriented to person, place, and time.  Psychiatric:        Mood and Affect: Mood normal.        Behavior: Behavior normal.     No results found for any visits on 02/29/24.  Assessment & Plan    Encounter for removal of staples -     Staple removal  History of subdural hematoma -     Ambulatory referral to Neurosurgery  History of craniotomy -     Ambulatory referral to Neurosurgery    Staple removal  Date/Time: 02/29/2024 3:21 PM  Performed by: Trenton Frock, PA-C Authorized by: Donnia Galas,  Heidi Llamas, PA-C  Body area: head/neck Location details: scalp Wound Appearance: clean Staples Removed: 24 Patient tolerance: patient tolerated the procedure well with no immediate complications     Return if symptoms worsen or fail to improve.       Trenton Frock, PA-C  [facility] at [facility] [phone] (phone) [phone] (fax)  [facility]

## 2024-03-01 ENCOUNTER — Inpatient Hospital Stay: Admitting: Hematology & Oncology

## 2024-03-01 ENCOUNTER — Inpatient Hospital Stay: Attending: Hematology & Oncology

## 2024-03-01 ENCOUNTER — Other Ambulatory Visit: Payer: Self-pay

## 2024-03-01 ENCOUNTER — Inpatient Hospital Stay

## 2024-03-01 ENCOUNTER — Encounter: Payer: Self-pay | Admitting: Speech Pathology

## 2024-03-01 ENCOUNTER — Encounter: Payer: Self-pay | Admitting: *Deleted

## 2024-03-01 ENCOUNTER — Encounter: Payer: Self-pay | Admitting: Hematology & Oncology

## 2024-03-01 VITALS — BP 134/78 | HR 84 | Temp 98.3°F | Resp 16 | Ht 67.0 in | Wt 150.0 lb

## 2024-03-01 DIAGNOSIS — K922 Gastrointestinal hemorrhage, unspecified: Secondary | ICD-10-CM | POA: Diagnosis not present

## 2024-03-01 DIAGNOSIS — C169 Malignant neoplasm of stomach, unspecified: Secondary | ICD-10-CM

## 2024-03-01 DIAGNOSIS — Z5111 Encounter for antineoplastic chemotherapy: Secondary | ICD-10-CM | POA: Insufficient documentation

## 2024-03-01 DIAGNOSIS — C160 Malignant neoplasm of cardia: Secondary | ICD-10-CM | POA: Diagnosis not present

## 2024-03-01 DIAGNOSIS — Z9221 Personal history of antineoplastic chemotherapy: Secondary | ICD-10-CM | POA: Diagnosis not present

## 2024-03-01 DIAGNOSIS — Z5189 Encounter for other specified aftercare: Secondary | ICD-10-CM | POA: Insufficient documentation

## 2024-03-01 DIAGNOSIS — Z79899 Other long term (current) drug therapy: Secondary | ICD-10-CM | POA: Diagnosis not present

## 2024-03-01 DIAGNOSIS — D509 Iron deficiency anemia, unspecified: Secondary | ICD-10-CM

## 2024-03-01 LAB — CBC WITH DIFFERENTIAL (CANCER CENTER ONLY)
Abs Immature Granulocytes: 0.55 10*3/uL — ABNORMAL HIGH (ref 0.00–0.07)
Basophils Absolute: 0.1 10*3/uL (ref 0.0–0.1)
Basophils Relative: 0 %
Eosinophils Absolute: 0.1 10*3/uL (ref 0.0–0.5)
Eosinophils Relative: 1 %
HCT: 35.4 % — ABNORMAL LOW (ref 39.0–52.0)
Hemoglobin: 11.4 g/dL — ABNORMAL LOW (ref 13.0–17.0)
Immature Granulocytes: 3 %
Lymphocytes Relative: 8 %
Lymphs Abs: 1.7 10*3/uL (ref 0.7–4.0)
MCH: 24.8 pg — ABNORMAL LOW (ref 26.0–34.0)
MCHC: 32.2 g/dL (ref 30.0–36.0)
MCV: 77.1 fL — ABNORMAL LOW (ref 80.0–100.0)
Monocytes Absolute: 1.1 10*3/uL — ABNORMAL HIGH (ref 0.1–1.0)
Monocytes Relative: 6 %
Neutro Abs: 17 10*3/uL — ABNORMAL HIGH (ref 1.7–7.7)
Neutrophils Relative %: 82 %
Platelet Count: 280 10*3/uL (ref 150–400)
RBC: 4.59 MIL/uL (ref 4.22–5.81)
RDW: 18.2 % — ABNORMAL HIGH (ref 11.5–15.5)
WBC Count: 20.5 10*3/uL — ABNORMAL HIGH (ref 4.0–10.5)
nRBC: 0 % (ref 0.0–0.2)

## 2024-03-01 LAB — CMP (CANCER CENTER ONLY)
ALT: 22 U/L (ref 0–44)
AST: 18 U/L (ref 15–41)
Albumin: 4.1 g/dL (ref 3.5–5.0)
Alkaline Phosphatase: 113 U/L (ref 38–126)
Anion gap: 7 (ref 5–15)
BUN: 20 mg/dL (ref 8–23)
CO2: 27 mmol/L (ref 22–32)
Calcium: 9 mg/dL (ref 8.9–10.3)
Chloride: 102 mmol/L (ref 98–111)
Creatinine: 0.95 mg/dL (ref 0.61–1.24)
GFR, Estimated: >60 mL/min (ref >60)
Glucose, Bld: 120 mg/dL — ABNORMAL HIGH (ref 70–99)
Potassium: 3.8 mmol/L (ref 3.5–5.1)
Sodium: 136 mmol/L (ref 135–145)
Total Bilirubin: 0.3 mg/dL (ref 0.0–1.2)
Total Protein: 6.8 g/dL (ref 6.5–8.1)

## 2024-03-01 LAB — LACTATE DEHYDROGENASE: LDH: 153 U/L (ref 98–192)

## 2024-03-01 MED ORDER — LEUCOVORIN CALCIUM INJECTION 350 MG
196.0000 mg/m2 | Freq: Once | INTRAVENOUS | Status: AC
  Administered 2024-03-01: 350 mg via INTRAVENOUS
  Filled 2024-03-01: qty 17.5

## 2024-03-01 MED ORDER — SODIUM CHLORIDE 0.9 % IV SOLN
510.0000 mg | Freq: Once | INTRAVENOUS | Status: AC
  Administered 2024-03-01: 510 mg via INTRAVENOUS
  Filled 2024-03-01: qty 17

## 2024-03-01 MED ORDER — SODIUM CHLORIDE 0.9 % IV SOLN
45.0000 mg/m2 | Freq: Once | INTRAVENOUS | Status: AC
  Administered 2024-03-01: 80 mg via INTRAVENOUS
  Filled 2024-03-01: qty 8

## 2024-03-01 MED ORDER — PALONOSETRON HCL INJECTION 0.25 MG/5ML
0.2500 mg | Freq: Once | INTRAVENOUS | Status: AC
  Administered 2024-03-01: 0.25 mg via INTRAVENOUS
  Filled 2024-03-01: qty 5

## 2024-03-01 MED ORDER — SODIUM CHLORIDE 0.9 % IV SOLN
2200.0000 mg/m2 | INTRAVENOUS | Status: DC
  Administered 2024-03-01: 3950 mg via INTRAVENOUS
  Filled 2024-03-01: qty 79

## 2024-03-01 MED ORDER — DEXAMETHASONE SODIUM PHOSPHATE 10 MG/ML IJ SOLN
10.0000 mg | Freq: Once | INTRAMUSCULAR | Status: AC
  Administered 2024-03-01: 10 mg via INTRAVENOUS
  Filled 2024-03-01: qty 1

## 2024-03-01 MED ORDER — SODIUM CHLORIDE 0.9 % IV SOLN: INTRAVENOUS | Status: DC

## 2024-03-01 MED ORDER — OXALIPLATIN CHEMO INJECTION 100 MG/20ML
76.5000 mg/m2 | Freq: Once | INTRAVENOUS | Status: AC
  Administered 2024-03-01: 150 mg via INTRAVENOUS
  Filled 2024-03-01: qty 20

## 2024-03-01 MED ORDER — DEXTROSE 5 % IV SOLN: INTRAVENOUS | Status: DC

## 2024-03-01 MED ORDER — SODIUM CHLORIDE 0.9% FLUSH: 10.0000 mL | INTRAVENOUS | Status: DC | PRN

## 2024-03-01 MED ORDER — LEUCOVORIN CALCIUM INJECTION 350 MG
200.0000 mg/m2 | Freq: Once | INTRAMUSCULAR | Status: DC
  Filled 2024-03-01: qty 17.9

## 2024-03-01 NOTE — Patient Instructions (Signed)

## 2024-03-01 NOTE — Patient Instructions (Addendum)
 [facility] - A DEPT OF [facility]  Discharge Instructions: Thank you for choosing [facility] to provide your oncology and hematology care.   If you have a lab appointment with the Cancer Center, please go directly to the Cancer Center and check in at the registration area.  Wear comfortable clothing and clothing appropriate for easy access to any Portacath or PICC line.   We strive to give you quality time with your provider. You may need to reschedule your appointment if you arrive late (15 or more minutes).  Arriving late affects you and other patients whose appointments are after yours.  Also, if you miss three or more appointments without notifying the office, you may be dismissed from the clinic at the provider's discretion.      For prescription refill requests, have your pharmacy contact our office and allow 72 hours for refills to be completed.    Today you received the following chemotherapy and/or immunotherapy agents Taxotere , oxaliplatin , leukovorin, 5FU, feraheme      To help prevent nausea and vomiting after your treatment, we encourage you to take your nausea medication as directed.  BELOW ARE SYMPTOMS THAT SHOULD BE REPORTED IMMEDIATELY: *FEVER GREATER THAN 100.4 F (38 C) OR HIGHER *CHILLS OR SWEATING *NAUSEA AND VOMITING THAT IS NOT CONTROLLED WITH YOUR NAUSEA MEDICATION *UNUSUAL SHORTNESS OF BREATH *UNUSUAL BRUISING OR BLEEDING *URINARY PROBLEMS (pain or burning when urinating, or frequent urination) *BOWEL PROBLEMS (unusual diarrhea, constipation, pain near the anus) TENDERNESS IN MOUTH AND THROAT WITH OR WITHOUT PRESENCE OF ULCERS (sore throat, sores in mouth, or a toothache) UNUSUAL Streett, SWELLING OR PAIN  UNUSUAL VAGINAL DISCHARGE OR ITCHING   Items with * indicate a potential emergency and should be followed up as soon as possible or go to the Emergency Department if any problems should occur.  Please show the  CHEMOTHERAPY ALERT CARD or IMMUNOTHERAPY ALERT CARD at check-in to the Emergency Department and triage nurse. Should you have questions after your visit or need to cancel or reschedule your appointment, please contact [facility] - A DEPT OF [facility]  [phone] and follow the prompts.  Office hours are 8:00 a.m. to 4:30 p.m. Monday - Friday. Please note that voicemails left after 4:00 p.m. may not be returned until the following business day.  We are closed weekends and major holidays. You have access to a nurse at all times for urgent questions. Please call the main number to the clinic [phone] and follow the prompts.  For any non-urgent questions, you may also contact your provider using MyChart. We now offer e-Visits for anyone 58 and older to request care online for non-urgent symptoms. For details visit [email or web address].   Also download the MyChart app! Go to the app store, search "MyChart", open the app, select [facility], and log in with your MyChart username and password.

## 2024-03-01 NOTE — Progress Notes (Signed)
 Patient is here for consideration of cycle two. He has seen the surgeon and has decided that he does not want surgery at any time. Treatment will now be chemo, followed by chemoRT and then 4 more cycles of chemo.   He will proceed with cycle two today.   Oncology Nurse Navigator Documentation     03/01/2024    9:00 AM  Oncology Nurse Navigator Flowsheets  Planned Course of Treatment Chemotherapy;Radiation  Navigator Follow Up Date: 03/15/2024  Navigator Follow Up Reason: Follow-up Appointment;Chemotherapy  Navigator Location [facility]  Navigator Encounter Type Appt/Treatment Plan Review  Patient Visit Type MedOnc  Treatment Phase Active Tx  Barriers/Navigation Needs Coordination of Care;Education  Interventions None Required  Acuity Level 2-Minimal Needs (1-2 Barriers Identified)  Support Groups/Services Friends and Family  Time Spent with Patient 15

## 2024-03-01 NOTE — Progress Notes (Signed)
 Hematology and Oncology Follow Up Visit  Matthew Lee [id number] 11/06/1955 68 y.o. 03/01/2024   Principle Diagnosis:  Locally advanced adenocarcinoma of the stomach Iron deficiency anemia-GI bleed  Current Therapy:   Neoadjuvant FLOT-s/p cycle #1 - start on 02/17/2024 Feraheme given on 02/17/2024     Interim History:  Mr. Hoopingarner is back for follow-up.  He decided that he does not want to have any type of gastric surgery.  He does feel that this would affect his quality of life in a negative way.  I can truly understand this.  He tolerated the first cycle of chemotherapy pretty well.  He really has had no bad side effects from this.  He actually is back to work.  He has recovered very nicely from this subdural hematoma that he had.  Again, he is showing a lot of toughness.  His appetite is doing okay.  His weight is holding steady.  He has had no problems with nausea or vomiting.  He has had no problems with cough or shortness of breath.  He has had no problems with bowels or bladder.  He says he is going to the bathroom without any difficulty.  He does have iron deficiency anemia.  When we last checked his iron saturation, it was 6%.  He has got a dose of IV iron.  His MCV is still on the low side.  I suspect that he probably is still iron deficient.  Currently, I would say that his performance status by ECOG 1.     Medications:  Current Outpatient Medications:    OVER THE COUNTER MEDICATION, Take 1 tablet by mouth daily. Iron supplement with Vitamin C, Folate, Vitemin B12, and beet extract, Disp: , Rfl:    amLODipine  (NORVASC ) 10 MG tablet, TAKE 1 TABLET DAILY, Disp: 90 tablet, Rfl: 3   atorvastatin  (LIPITOR) 10 MG tablet, Take 1 tablet (10 mg total) by mouth daily., Disp: 90 tablet, Rfl: 3   dexamethasone  (DECADRON ) 4 MG tablet, Take 2 tablets (8 mg total) by mouth daily. Start the day after chemotherapy for 2 days. Take with food., Disp: 30 tablet, Rfl: 1   labetalol  (NORMODYNE )  100 MG tablet, TAKE ONE AND ONE-HALF TABLETS IN THE MORNING AND AT BEDTIME, Disp: 270 tablet, Rfl: 1   levETIRAcetam  (KEPPRA ) 500 MG tablet, Take 1 tablet (500 mg total) by mouth 2 (two) times daily., Disp: 60 tablet, Rfl: 2   losartan  (COZAAR ) 50 MG tablet, Take 1 tablet (50 mg total) by mouth daily., Disp: , Rfl:    ondansetron  (ZOFRAN ) 8 MG tablet, Take 1 tablet (8 mg total) by mouth every 8 (eight) hours as needed for nausea or vomiting. Start on the third day after chemotherapy., Disp: 30 tablet, Rfl: 1   pantoprazole  (PROTONIX ) 40 MG tablet, Take 1 tablet (40 mg total) by mouth 2 (two) times daily., Disp: 60 tablet, Rfl: 2   prochlorperazine  (COMPAZINE ) 10 MG tablet, Take 1 tablet (10 mg total) by mouth every 6 (six) hours as needed for nausea or vomiting., Disp: 30 tablet, Rfl: 1  Allergies: No Known Allergies  Past Medical History, Surgical history, Social history, and Family History were reviewed and updated.  Review of Systems: Review of Systems  Constitutional: Negative.   HENT:  Negative.    Eyes: Negative.   Respiratory: Negative.    Cardiovascular: Negative.   Gastrointestinal: Negative.   Endocrine: Negative.   Genitourinary: Negative.    Musculoskeletal: Negative.   Skin: Negative.   Neurological: Negative.  Hematological: Negative.   Psychiatric/Behavioral: Negative.      Physical Exam:  height is 5\' 7"  (1.702 m) and weight is 150 lb (68 kg). His oral temperature is 98.3 F (36.8 C). His blood pressure is 126/86 (pended) and his pulse is 84. His respiration is 16 and oxygen saturation is 99%.   Wt Readings from Last 3 Encounters:  03/01/24 150 lb (68 kg)  02/29/24 150 lb 9.6 oz (68.3 kg)  02/18/24 151 lb 4.8 oz (68.6 kg)    Physical Exam Vitals reviewed.  HENT:     Head: Normocephalic and atraumatic.     Comments: Head neck exam shows a dressing on the surgical scar on the right scalp. Eyes:     Pupils: Pupils are equal, round, and reactive to light.   Cardiovascular:     Rate and Rhythm: Normal rate and regular rhythm.     Heart sounds: Normal heart sounds.  Pulmonary:     Effort: Pulmonary effort is normal.     Breath sounds: Normal breath sounds.  Abdominal:     General: Bowel sounds are normal.     Palpations: Abdomen is soft.  Musculoskeletal:        General: No tenderness or deformity. Normal range of motion.     Cervical back: Normal range of motion.  Lymphadenopathy:     Cervical: No cervical adenopathy.  Skin:    General: Skin is warm and dry.     Findings: No erythema or Killings.  Neurological:     Mental Status: He is alert and oriented to person, place, and time.  Psychiatric:        Behavior: Behavior normal.        Thought Content: Thought content normal.        Judgment: Judgment normal.     Lab Results  Component Value Date   WBC 20.5 (H) 03/01/2024   HGB 11.4 (L) 03/01/2024   HCT 35.4 (L) 03/01/2024   MCV 77.1 (L) 03/01/2024   PLT 280 03/01/2024     Chemistry      Component Value Date/Time   NA 135 02/16/2024 0820   K 4.0 02/16/2024 0820   CL 102 02/16/2024 0820   CO2 25 02/16/2024 0820   BUN 16 02/16/2024 0820   CREATININE 0.86 02/16/2024 0820   CREATININE 0.85 01/21/2018 1500      Component Value Date/Time   CALCIUM  9.4 02/16/2024 0820   ALKPHOS 140 (H) 02/16/2024 0820   AST 16 02/16/2024 0820   ALT 14 02/16/2024 0820   BILITOT 0.4 02/16/2024 0820      Impression and Plan: Mr. Bents is a very nice 68 year old white male.  He has hopefully noted but localized adenocarcinoma of the stomach.  Again, he does not wish to have surgery.  As such, we may apply a "sandwich" approach.  I will give him full dose chemotherapy for 4 cycles.  Then I will give low-dose Xeloda with radiation therapy.  After that, we will then give him 4 more cycles of full dose chemotherapy.  I am just happy that he is doing well from a cognitive standpoint.  He is working which is nice to see.  I know this is important  for him.  I would like to think that we should be able to get a good response.  We will go ahead and plan to get him back in another couple of weeks.  After 4 cycles, we will repeat the PET scan and endoscopy.  Ivor Mars, MD 6/4/20258:57 AM

## 2024-03-02 ENCOUNTER — Inpatient Hospital Stay: Admitting: Dietician

## 2024-03-02 ENCOUNTER — Ambulatory Visit: Admitting: Speech Pathology

## 2024-03-02 ENCOUNTER — Inpatient Hospital Stay

## 2024-03-02 VITALS — BP 127/69 | HR 76 | Temp 98.4°F | Resp 18

## 2024-03-02 DIAGNOSIS — K922 Gastrointestinal hemorrhage, unspecified: Secondary | ICD-10-CM | POA: Diagnosis not present

## 2024-03-02 DIAGNOSIS — Z79899 Other long term (current) drug therapy: Secondary | ICD-10-CM | POA: Diagnosis not present

## 2024-03-02 DIAGNOSIS — C160 Malignant neoplasm of cardia: Secondary | ICD-10-CM | POA: Diagnosis not present

## 2024-03-02 DIAGNOSIS — Z5111 Encounter for antineoplastic chemotherapy: Secondary | ICD-10-CM | POA: Diagnosis not present

## 2024-03-02 DIAGNOSIS — Z9221 Personal history of antineoplastic chemotherapy: Secondary | ICD-10-CM | POA: Diagnosis not present

## 2024-03-02 DIAGNOSIS — Z5189 Encounter for other specified aftercare: Secondary | ICD-10-CM | POA: Diagnosis not present

## 2024-03-02 DIAGNOSIS — C169 Malignant neoplasm of stomach, unspecified: Secondary | ICD-10-CM

## 2024-03-02 MED ORDER — HEPARIN SOD (PORK) LOCK FLUSH 100 UNIT/ML IV SOLN
500.0000 [IU] | Freq: Once | INTRAVENOUS | Status: AC | PRN
  Administered 2024-03-02: 500 [IU]

## 2024-03-02 MED ORDER — PEGFILGRASTIM-JMDB 6 MG/0.6ML ~~LOC~~ SOSY
6.0000 mg | PREFILLED_SYRINGE | Freq: Once | SUBCUTANEOUS | Status: AC
  Administered 2024-03-02: 6 mg via SUBCUTANEOUS
  Filled 2024-03-02: qty 0.6

## 2024-03-02 MED ORDER — SODIUM CHLORIDE 0.9% FLUSH
10.0000 mL | INTRAVENOUS | Status: DC | PRN
  Administered 2024-03-02: 10 mL

## 2024-03-02 NOTE — Progress Notes (Signed)
 Nutrition Assessment: Reached out to patient at mobile telephone number, he relayed this is best # to reach..    Reason for Assessment: New Patient Assessment   ASSESSMENT: Patient is a 68 year old male with locally advanced adenocarcinoma of the stomach, Iron deficiency anemia-GI bleed. Per Dr. Birt Bulla notes patient has decided against any surgery and will be treated with a full dose FLOT chemotherapy for 4 cycles, followed with low-dose Xeloda with radiation therapy, then 4 more cycles of full dose chemotherapy.    Patient denies any NIS at this time. He reports was told to eat what he wanted.  He had been attentive to reducing his sugar and salt using diet sodas and KCl as salt substitutes for his HTN.  He also avoids caffeine. He has no food allergies. Usual intake: Breakfast: Sausage biscuit gravy, hot cereals made with milk, cream of wheat with bacon, scrambled eggs with bacon Dinner: Spouse cooks [employer], mashed potatoes, Snack: Nutra grain bars, some hard candy or [employer], avoids nuts (gets under dentures) Pepsi Zero caffeine free, no alcohol, Water 6-7 bottle, CIB with 2% 3 times a week,   Nutrition Focused Physical Exam: unable to perform NFPE   Medications: Taking MVI, decadron , anti emetic ordered but no using yet   Labs: Reviewed  03/01/24 patient relayed glucose were not fasting levels, he eats prior to arrival   Anthropometrics: small fluctuation past 2 months, patient comfortable at current weight  Height: 67" Weight: 150# UBW: 165-175# BMI: 23.49   Estimated Energy Needs  Kcals: 2000-2400 Protein: 82-102 g Fluid: 2.5 L   NUTRITION DIAGNOSIS: Food and Nutrition Related Knowledge Deficit related to cancer and associated treatments as evidenced by no prior need for nutrition related information.   INTERVENTION:   Relayed that nutrition services are wrap around service provided at no charge and encouraged continued communication if experiencing  any nutritional impact symptoms (NIS). Educated on importance of adequate nourishment with calorie and protein energy intake with nutrient dense foods when possible to maintain weight/strength and QOL.   Encouraged increasing calcium  through out day with more milk or cheese as able.  Discussed strategies for  Emailed Nutrition Tip sheet for Nutrition During Cancer Treatment with contact information provided.  MONITORING, EVALUATION, GOAL: weight trends, nutrition impact symptoms, PO intake, labs  Encouraged weight maintenance  Next Visit: Remote 2 weeks to monitor response to chemo and NIS  Carleen Chary, RDN, LDN Registered Dietitian, [facility] Part Time Remote (Usual office hours: Tuesday-Thursday) Mobile: [phone]

## 2024-03-02 NOTE — Patient Instructions (Signed)

## 2024-03-03 ENCOUNTER — Ambulatory Visit: Payer: Self-pay | Admitting: Hematology & Oncology

## 2024-03-03 ENCOUNTER — Encounter: Payer: Self-pay | Admitting: Speech Pathology

## 2024-03-03 ENCOUNTER — Ambulatory Visit: Admitting: Speech Pathology

## 2024-03-03 ENCOUNTER — Other Ambulatory Visit: Payer: Self-pay

## 2024-03-03 NOTE — Therapy (Signed)
 Pt arrived to [employer] with questions about Short Term Disability paperwork. SLP had previously explained over the phone the paperwork needed to be completed by his physician. Pt wanted SLP to look over paperwork and sign. SLP reviewed paperwork and reiterated the need for his physician to complete. SLP encouraged pt to reach out to his PCP's office for instructions on how to navigate this process.

## 2024-03-06 ENCOUNTER — Ambulatory Visit: Admitting: Speech Pathology

## 2024-03-08 ENCOUNTER — Encounter: Payer: Self-pay | Admitting: Family

## 2024-03-08 NOTE — Telephone Encounter (Signed)
 Good morning!   Matthew Lee has dropped off some paperwork!

## 2024-03-08 NOTE — Telephone Encounter (Signed)
 Form in provider's folder

## 2024-03-09 ENCOUNTER — Encounter: Payer: Self-pay | Admitting: Hematology & Oncology

## 2024-03-09 ENCOUNTER — Ambulatory Visit: Admitting: Speech Pathology

## 2024-03-09 DIAGNOSIS — K2970 Gastritis, unspecified, without bleeding: Secondary | ICD-10-CM | POA: Diagnosis not present

## 2024-03-10 ENCOUNTER — Other Ambulatory Visit: Payer: Self-pay

## 2024-03-13 ENCOUNTER — Telehealth: Payer: Self-pay | Admitting: Family

## 2024-03-13 DIAGNOSIS — Z0279 Encounter for issue of other medical certificate: Secondary | ICD-10-CM

## 2024-03-13 NOTE — Telephone Encounter (Signed)
 Patient notified his for will be ready for pick up tomorrow

## 2024-03-13 NOTE — Telephone Encounter (Signed)
 See mychart.

## 2024-03-14 ENCOUNTER — Ambulatory Visit: Payer: Self-pay | Admitting: Family

## 2024-03-14 NOTE — Telephone Encounter (Signed)
 Patient advised to pick up

## 2024-03-14 NOTE — Telephone Encounter (Signed)
 FYI.  Form is on your desk. Tks.

## 2024-03-15 ENCOUNTER — Inpatient Hospital Stay

## 2024-03-15 ENCOUNTER — Inpatient Hospital Stay: Admitting: Licensed Clinical Social Worker

## 2024-03-15 ENCOUNTER — Encounter: Payer: Self-pay | Admitting: Hematology & Oncology

## 2024-03-15 ENCOUNTER — Inpatient Hospital Stay: Admitting: Hematology & Oncology

## 2024-03-15 VITALS — BP 129/71 | HR 77

## 2024-03-15 VITALS — BP 104/63 | HR 73 | Temp 98.4°F | Resp 18 | Ht 67.0 in | Wt 155.0 lb

## 2024-03-15 DIAGNOSIS — Z9221 Personal history of antineoplastic chemotherapy: Secondary | ICD-10-CM | POA: Diagnosis not present

## 2024-03-15 DIAGNOSIS — K922 Gastrointestinal hemorrhage, unspecified: Secondary | ICD-10-CM | POA: Diagnosis not present

## 2024-03-15 DIAGNOSIS — C169 Malignant neoplasm of stomach, unspecified: Secondary | ICD-10-CM | POA: Diagnosis not present

## 2024-03-15 DIAGNOSIS — Z79899 Other long term (current) drug therapy: Secondary | ICD-10-CM | POA: Diagnosis not present

## 2024-03-15 DIAGNOSIS — Z5111 Encounter for antineoplastic chemotherapy: Secondary | ICD-10-CM | POA: Diagnosis not present

## 2024-03-15 DIAGNOSIS — Z5189 Encounter for other specified aftercare: Secondary | ICD-10-CM | POA: Diagnosis not present

## 2024-03-15 DIAGNOSIS — C160 Malignant neoplasm of cardia: Secondary | ICD-10-CM | POA: Diagnosis not present

## 2024-03-15 LAB — IRON AND IRON BINDING CAPACITY (CC-WL,HP ONLY)
Iron: 96 ug/dL (ref 45–182)
Saturation Ratios: 27 % (ref 17.9–39.5)
TIBC: 356 ug/dL (ref 250–450)
UIBC: 260 ug/dL (ref 117–376)

## 2024-03-15 LAB — CBC WITH DIFFERENTIAL (CANCER CENTER ONLY)
Abs Immature Granulocytes: 0.2 10*3/uL — ABNORMAL HIGH (ref 0.00–0.07)
Basophils Absolute: 0.1 10*3/uL (ref 0.0–0.1)
Basophils Relative: 0 %
Eosinophils Absolute: 0.1 10*3/uL (ref 0.0–0.5)
Eosinophils Relative: 1 %
HCT: 36.4 % — ABNORMAL LOW (ref 39.0–52.0)
Hemoglobin: 11.4 g/dL — ABNORMAL LOW (ref 13.0–17.0)
Immature Granulocytes: 1 %
Lymphocytes Relative: 9 %
Lymphs Abs: 1.5 10*3/uL (ref 0.7–4.0)
MCH: 24.8 pg — ABNORMAL LOW (ref 26.0–34.0)
MCHC: 31.3 g/dL (ref 30.0–36.0)
MCV: 79.3 fL — ABNORMAL LOW (ref 80.0–100.0)
Monocytes Absolute: 1 10*3/uL (ref 0.1–1.0)
Monocytes Relative: 6 %
Neutro Abs: 12.9 10*3/uL — ABNORMAL HIGH (ref 1.7–7.7)
Neutrophils Relative %: 83 %
Platelet Count: 226 10*3/uL (ref 150–400)
RBC: 4.59 MIL/uL (ref 4.22–5.81)
RDW: 21.2 % — ABNORMAL HIGH (ref 11.5–15.5)
WBC Count: 15.7 10*3/uL — ABNORMAL HIGH (ref 4.0–10.5)
nRBC: 0 % (ref 0.0–0.2)

## 2024-03-15 LAB — CMP (CANCER CENTER ONLY)
ALT: 22 U/L (ref 0–44)
AST: 21 U/L (ref 15–41)
Albumin: 3.9 g/dL (ref 3.5–5.0)
Alkaline Phosphatase: 105 U/L (ref 38–126)
Anion gap: 7 (ref 5–15)
BUN: 21 mg/dL (ref 8–23)
CO2: 28 mmol/L (ref 22–32)
Calcium: 9.1 mg/dL (ref 8.9–10.3)
Chloride: 105 mmol/L (ref 98–111)
Creatinine: 0.93 mg/dL (ref 0.61–1.24)
GFR, Estimated: >60 mL/min (ref >60)
Glucose, Bld: 150 mg/dL — ABNORMAL HIGH (ref 70–99)
Potassium: 4.1 mmol/L (ref 3.5–5.1)
Sodium: 140 mmol/L (ref 135–145)
Total Bilirubin: 0.3 mg/dL (ref 0.0–1.2)
Total Protein: 6.3 g/dL — ABNORMAL LOW (ref 6.5–8.1)

## 2024-03-15 LAB — FERRITIN: Ferritin: 751 ng/mL — ABNORMAL HIGH (ref 24–336)

## 2024-03-15 MED ORDER — PALONOSETRON HCL INJECTION 0.25 MG/5ML
0.2500 mg | Freq: Once | INTRAVENOUS | Status: AC
  Administered 2024-03-15: 0.25 mg via INTRAVENOUS
  Filled 2024-03-15: qty 5

## 2024-03-15 MED ORDER — DEXAMETHASONE SODIUM PHOSPHATE 10 MG/ML IJ SOLN
10.0000 mg | Freq: Once | INTRAMUSCULAR | Status: AC
  Administered 2024-03-15: 10 mg via INTRAVENOUS
  Filled 2024-03-15: qty 1

## 2024-03-15 MED ORDER — SODIUM CHLORIDE 0.9% FLUSH: 10.0000 mL | INTRAVENOUS | Status: DC | PRN

## 2024-03-15 MED ORDER — HEPARIN SOD (PORK) LOCK FLUSH 100 UNIT/ML IV SOLN: 500.0000 [IU] | Freq: Once | INTRAVENOUS | Status: DC

## 2024-03-15 MED ORDER — VENLAFAXINE HCL 37.5 MG PO TABS: 37.5000 mg | ORAL_TABLET | Freq: Every day | ORAL | 3 refills | Status: DC

## 2024-03-15 MED ORDER — OXALIPLATIN CHEMO INJECTION 100 MG/20ML
76.5000 mg/m2 | Freq: Once | INTRAVENOUS | Status: AC
  Administered 2024-03-15: 150 mg via INTRAVENOUS
  Filled 2024-03-15: qty 20

## 2024-03-15 MED ORDER — SODIUM CHLORIDE 0.9 % IV SOLN
45.0000 mg/m2 | Freq: Once | INTRAVENOUS | Status: AC
  Administered 2024-03-15: 80 mg via INTRAVENOUS
  Filled 2024-03-15: qty 8

## 2024-03-15 MED ORDER — LEUCOVORIN CALCIUM INJECTION 350 MG
196.0000 mg/m2 | Freq: Once | INTRAVENOUS | Status: AC
  Administered 2024-03-15: 350 mg via INTRAVENOUS
  Filled 2024-03-15: qty 17.5

## 2024-03-15 MED ORDER — DEXTROSE 5 % IV SOLN: INTRAVENOUS | Status: DC

## 2024-03-15 MED ORDER — SODIUM CHLORIDE 0.9 % IV SOLN
2200.0000 mg/m2 | INTRAVENOUS | Status: DC
  Administered 2024-03-15: 3950 mg via INTRAVENOUS
  Filled 2024-03-15: qty 79

## 2024-03-15 NOTE — Addendum Note (Signed)
 Addended by: Gray Layman R on: 03/15/2024 01:32 PM   Modules accepted: Orders

## 2024-03-15 NOTE — Progress Notes (Signed)
 [facility] Clinical Social Work  Initial Assessment   Marsel Gail Lookabaugh is a 68 y.o. year old male presenting alone. Clinical Social Work was referred by new patient protocol for assessment of psychosocial needs.   SDOH (Social Determinants of Health) assessments performed: Yes SDOH Interventions    Flowsheet Row Office Visit from 02/02/2024 in [facility] [place] Clinical Support from 06[facility]02/2022 in [facility] Primary [facility] at [facility] Clinical Support from 05/29/2021 in [facility] Primary Care at [facility]  SDOH Interventions     Utilities Interventions -- Intervention Not Indicated --  Physical Activity Interventions -- -- Intervention Not Indicated  [pt states he walks a lot at work & has also set a goal to increase activity]  Health Literacy Interventions Intervention Not Indicated -- --    SDOH Screenings   Food Insecurity: No Food Insecurity (03/14/2024)  Housing: Unknown (03/14/2024)  Transportation Needs: No Transportation Needs (03/14/2024)  Utilities: Not At Risk (01/25/2024)  Alcohol Screen: Low Risk  (05/29/2021)  Depression (PHQ2-9): Low Risk  (12/15/2023)  Financial Resource Strain: Low Risk  (03/14/2024)  Physical Activity: Inactive (03/14/2024)  Social Connections: Socially Isolated (03/14/2024)  Stress: No Stress Concern Present (03/14/2024)  Recent Concern: Stress - Stress Concern Present (12/15/2023)  Tobacco Use: Medium Risk (03/15/2024)  Health Literacy: Adequate Health Literacy (02/02/2024)     Distress Screen completed: No    02/15/2024    1:00 PM  ONCBCN DISTRESS SCREENING  Screening Type Initial Screening  How much distress have you been experiencing in the past week? (0-10) 4  Practical concerns type Taking care of myself;Taking care of others;Work  Emotional concerns type Worry or anxiety  Physical Concerns Type  Loss or change of physical abilities      Family/Social Information:   Housing Arrangement: patient lives with his wife, [name]. Family members/support persons in your life? Family Transportation concerns: no  Employment: Working full time as an Art gallery manager..  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 Advanced directives: No Services Currently in place:  BCBS  Coping/ Adjustment to diagnosis: Patient understands treatment plan and what happens next? yes Concerns about diagnosis and/or treatment: I'm not especially worried about anything Patient reported stressors: None at this time. Current coping skills/ strengths: Average or above average intelligence , Capable of independent living , Communication skills , Financial means , General fund of knowledge , Motivation for treatment/growth , Supportive family/friends , and Work skills     SUMMARY: Current SDOH Barriers:  None per patient.  Clinical Social Work Clinical Goal(s):  No clinical social work goals at this time  Interventions: Discussed common feeling and emotions when being diagnosed with cancer, and the importance of support during treatment Informed patient of the support team roles and support services at [facility] Provided CSW contact information and encouraged patient to call with any questions or concerns Provided patient with information about CSW role.  Discussed [employer] free massages.  Provided patient with certificates.  Patient expressed no other needs.   Follow Up Plan: Patient will contact CSW with any support or resource needs Patient verbalizes understanding of plan: Yes    Kennth Peal, LCSW Clinical Social Worker [facility]

## 2024-03-15 NOTE — Addendum Note (Signed)
 Encounter addended by: Karolynn Pack on: 03/15/2024 3:52 PM  Actions taken: Imaging Exam ended

## 2024-03-15 NOTE — Progress Notes (Signed)
 Hematology and Oncology Follow Up Visit  Matthew Lee [id number] Jan 29, 1956 68 y.o. 03/15/2024   Principle Diagnosis:  Locally advanced adenocarcinoma of the stomach Iron deficiency anemia-GI bleed  Current Therapy:   Neoadjuvant FLOT-s/p cycle #2 - start on 02/17/2024 Feraheme given on 02/17/2024     Interim History:  Matthew Lee is back for follow-up.  He actually looks incredibly fit.  I must give a lot of credit for the perseverance that he has.  He has had great support from his wife.  He is still working.  He truly enjoys work.  He says there is no odynophagia or dysphagia.  Hopefully, this is a good sign that the tumor is shrinking.  His weight is going up.  This also should be a good sign that his tumor is shrinking.  He has had no issues with bleeding.  There is been no change in bowel or bladder habits.  He has had no mouth sores.  Currently, I would say that his performance status is ECOG 1.   Medications:  Current Outpatient Medications:    amLODipine  (NORVASC ) 10 MG tablet, TAKE 1 TABLET DAILY, Disp: 90 tablet, Rfl: 3   atorvastatin  (LIPITOR) 10 MG tablet, Take 1 tablet (10 mg total) by mouth daily., Disp: 90 tablet, Rfl: 3   dexamethasone  (DECADRON ) 4 MG tablet, Take 2 tablets (8 mg total) by mouth daily. Start the day after chemotherapy for 2 days. Take with food., Disp: 30 tablet, Rfl: 1   labetalol  (NORMODYNE ) 100 MG tablet, TAKE ONE AND ONE-HALF TABLETS IN THE MORNING AND AT BEDTIME, Disp: 270 tablet, Rfl: 1   levETIRAcetam  (KEPPRA ) 500 MG tablet, Take 1 tablet (500 mg total) by mouth 2 (two) times daily., Disp: 60 tablet, Rfl: 2   losartan  (COZAAR ) 50 MG tablet, Take 1 tablet (50 mg total) by mouth daily., Disp: , Rfl:    ondansetron  (ZOFRAN ) 8 MG tablet, Take 1 tablet (8 mg total) by mouth every 8 (eight) hours as needed for nausea or vomiting. Start on the third day after chemotherapy., Disp: 30 tablet, Rfl: 1   pantoprazole  (PROTONIX ) 40 MG tablet, Take 1 tablet  (40 mg total) by mouth 2 (two) times daily., Disp: 60 tablet, Rfl: 2   prochlorperazine  (COMPAZINE ) 10 MG tablet, Take 1 tablet (10 mg total) by mouth every 6 (six) hours as needed for nausea or vomiting., Disp: 30 tablet, Rfl: 1   OVER THE COUNTER MEDICATION, Take 1 tablet by mouth daily. Iron supplement with Vitamin C, Folate, Vitemin B12, and beet extract, Disp: , Rfl:   Allergies: No Known Allergies  Past Medical History, Surgical history, Social history, and Family History were reviewed and updated.  Review of Systems: Review of Systems  Constitutional: Negative.   HENT:  Negative.    Eyes: Negative.   Respiratory: Negative.    Cardiovascular: Negative.   Gastrointestinal: Negative.   Endocrine: Negative.   Genitourinary: Negative.    Musculoskeletal: Negative.   Skin: Negative.   Neurological: Negative.   Hematological: Negative.   Psychiatric/Behavioral: Negative.      Physical Exam:  height is 5' 7 (1.702 m) and weight is 155 lb (70.3 kg). His oral temperature is 98.4 F (36.9 C). His blood pressure is 104/63 and his pulse is 73. His respiration is 18 and oxygen saturation is 96%.   Wt Readings from Last 3 Encounters:  03/15/24 155 lb (70.3 kg)  03/01/24 150 lb (68 kg)  02/29/24 150 lb 9.6 oz (68.3 kg)  Physical Exam Vitals reviewed.  HENT:     Head: Normocephalic and atraumatic.     Comments: Head neck exam shows a dressing on the surgical scar on the right scalp.  Eyes:     Pupils: Pupils are equal, round, and reactive to light.    Cardiovascular:     Rate and Rhythm: Normal rate and regular rhythm.     Heart sounds: Normal heart sounds.  Pulmonary:     Effort: Pulmonary effort is normal.     Breath sounds: Normal breath sounds.  Abdominal:     General: Bowel sounds are normal.     Palpations: Abdomen is soft.   Musculoskeletal:        General: No tenderness or deformity. Normal range of motion.     Cervical back: Normal range of motion.   Lymphadenopathy:     Cervical: No cervical adenopathy.   Skin:    General: Skin is warm and dry.     Findings: No erythema or Sinclair.   Neurological:     Mental Status: He is alert and oriented to person, place, and time.   Psychiatric:        Behavior: Behavior normal.        Thought Content: Thought content normal.        Judgment: Judgment normal.      Lab Results  Component Value Date   WBC 15.7 (H) 03/15/2024   HGB 11.4 (L) 03/15/2024   HCT 36.4 (L) 03/15/2024   MCV 79.3 (L) 03/15/2024   PLT 226 03/15/2024     Chemistry      Component Value Date/Time   NA 136 03/01/2024 0830   K 3.8 03/01/2024 0830   CL 102 03/01/2024 0830   CO2 27 03/01/2024 0830   BUN 20 03/01/2024 0830   CREATININE 0.95 03/01/2024 0830   CREATININE 0.85 01/21/2018 1500      Component Value Date/Time   CALCIUM  9.0 03/01/2024 0830   ALKPHOS 113 03/01/2024 0830   AST 18 03/01/2024 0830   ALT 22 03/01/2024 0830   BILITOT 0.3 03/01/2024 0830      Impression and Plan: Matthew Lee is a very nice 68 year old white male.  He has hopefully noted but localized adenocarcinoma of the stomach.  We will go ahead with his third cycle of treatment.  After his fourth cycle, we will rescan him and do another upper endoscopy to see how well he is responded.  After that, I would then give radiation therapy with Xeloda.  Again he does not wish to have surgery.  I think that the IV iron certainly is helped him.  We will have to see what the iron levels look like.  Just happy that his quality life is doing better right now.  He is able to work.  He truly enjoys working.  We will get him back in 2 weeks.   Ivor Mars, MD 6/18/20258:26 AM

## 2024-03-15 NOTE — Patient Instructions (Signed)

## 2024-03-15 NOTE — Patient Instructions (Signed)
 [facility] - A DEPT OF [place]  Discharge Instructions: Thank you for choosing [facility] to provide your oncology and hematology care.   If you have a lab appointment with the Cancer Center, please go directly to the Cancer Center and check in at the registration area.  Wear comfortable clothing and clothing appropriate for easy access to any Portacath or PICC line.   We strive to give you quality time with your provider. You may need to reschedule your appointment if you arrive late (15 or more minutes).  Arriving late affects you and other patients whose appointments are after yours.  Also, if you miss three or more appointments without notifying the office, you may be dismissed from the clinic at the provider's discretion.      For prescription refill requests, have your pharmacy contact our office and allow 72 hours for refills to be completed.    Today you received the following chemotherapy and/or immunotherapy agents Taxotere /Oxaliplatin /Leucovorin /5 Fu       To help prevent nausea and vomiting after your treatment, we encourage you to take your nausea medication as directed.  BELOW ARE SYMPTOMS THAT SHOULD BE REPORTED IMMEDIATELY: *FEVER GREATER THAN 100.4 F (38 C) OR HIGHER *CHILLS OR SWEATING *NAUSEA AND VOMITING THAT IS NOT CONTROLLED WITH YOUR NAUSEA MEDICATION *UNUSUAL SHORTNESS OF BREATH *UNUSUAL BRUISING OR BLEEDING *URINARY PROBLEMS (pain or burning when urinating, or frequent urination) *BOWEL PROBLEMS (unusual diarrhea, constipation, pain near the anus) TENDERNESS IN MOUTH AND THROAT WITH OR WITHOUT PRESENCE OF ULCERS (sore throat, sores in mouth, or a toothache) UNUSUAL [place], SWELLING OR PAIN  UNUSUAL VAGINAL DISCHARGE OR ITCHING   Items with * indicate a potential emergency and should be followed up as soon as possible or go to the Emergency Department if any problems should occur.  Please show the CHEMOTHERAPY  ALERT CARD or IMMUNOTHERAPY ALERT CARD at check-in to the Emergency Department and triage nurse. Should you have questions after your visit or need to cancel or reschedule your appointment, please contact [facility] - A DEPT OF [facility]  [phone] and follow the prompts.  Office hours are 8:00 a.m. to 4:30 p.m. Monday - Friday. Please note that voicemails left after 4:00 p.m. may not be returned until the following business day.  We are closed weekends and major holidays. You have access to a nurse at all times for urgent questions. Please call the main number to the clinic [phone] and follow the prompts.  For any non-urgent questions, you may also contact your provider using MyChart. We now offer e-Visits for anyone 75 and older to request care online for non-urgent symptoms. For details visit [email or web address].   Also download the MyChart app! Go to the app store, search MyChart, open the app, select [place], and log in with your MyChart username and password.

## 2024-03-16 ENCOUNTER — Inpatient Hospital Stay

## 2024-03-16 ENCOUNTER — Inpatient Hospital Stay: Admitting: Dietician

## 2024-03-16 VITALS — BP 115/62 | HR 80 | Temp 98.2°F | Resp 20

## 2024-03-16 DIAGNOSIS — K922 Gastrointestinal hemorrhage, unspecified: Secondary | ICD-10-CM | POA: Diagnosis not present

## 2024-03-16 DIAGNOSIS — Z79899 Other long term (current) drug therapy: Secondary | ICD-10-CM | POA: Diagnosis not present

## 2024-03-16 DIAGNOSIS — Z5189 Encounter for other specified aftercare: Secondary | ICD-10-CM | POA: Diagnosis not present

## 2024-03-16 DIAGNOSIS — Z5111 Encounter for antineoplastic chemotherapy: Secondary | ICD-10-CM | POA: Diagnosis not present

## 2024-03-16 DIAGNOSIS — C160 Malignant neoplasm of cardia: Secondary | ICD-10-CM | POA: Diagnosis not present

## 2024-03-16 DIAGNOSIS — Z9221 Personal history of antineoplastic chemotherapy: Secondary | ICD-10-CM | POA: Diagnosis not present

## 2024-03-16 DIAGNOSIS — C169 Malignant neoplasm of stomach, unspecified: Secondary | ICD-10-CM

## 2024-03-16 MED ORDER — PEGFILGRASTIM-JMDB 6 MG/0.6ML ~~LOC~~ SOSY
6.0000 mg | PREFILLED_SYRINGE | Freq: Once | SUBCUTANEOUS | Status: AC
  Administered 2024-03-16: 6 mg via SUBCUTANEOUS
  Filled 2024-03-16: qty 0.6

## 2024-03-16 MED ORDER — SODIUM CHLORIDE 0.9% FLUSH: 10.0000 mL | INTRAVENOUS | Status: DC | PRN

## 2024-03-16 MED ORDER — HEPARIN SOD (PORK) LOCK FLUSH 100 UNIT/ML IV SOLN: 500.0000 [IU] | Freq: Once | INTRAVENOUS | Status: AC | PRN

## 2024-03-16 NOTE — Patient Instructions (Signed)

## 2024-03-16 NOTE — Progress Notes (Signed)
 Nutrition follow up:  Reached out to patient's mobile.  He reports feeling well, denies NIS at this time other than 1 day of sluggish bowel which isn't unusual for him.  He does report not enjoying red meats as much as he used to and was looking at alternatives.  He had questions regarding risk reduction efforts for heart disease and red and processed meats.  He loves cottage cheese and [place] yogurt.  He's been closely monitoring his lab work and looking at what he can do to improve his values.  He reports still trying to hydrate well.   Medications: reviewed   Labs: 03/15/24  total protein 6.3, WBC 15.7, Hgb 11.4  Anthropometrics: weight rebounded   Height: 67 Weight:  03/15/24  155# 03/01/24  150# UBW: 165-175# BMI: 24.28   Estimated Energy Needs  Kcals: 2000-2400 Protein: 82-102 g Fluid: 2.5 L   NUTRITION DIAGNOSIS: Food and Nutrition Related Knowledge Deficit related to cancer and associated treatments as evidenced by no prior need for nutrition related information.   INTERVENTION:   Encouraged checking labels for snack bars for protein content and trying to but ones 10-15 grams per serving. Relayed that some of his lab values are r/t inflammation and with WBC elevated inflammation could be affecting. Relayed AICR guidelines for red and processed meats as well has AHA guidelines for saturated fats.  Discussed ways to choose healthier options. Discussed strategies for protein alternatives with plant based options, emailed Nutrition Tip sheet.  MONITORING, EVALUATION, GOAL: weight trends, nutrition impact symptoms, PO intake, labs  Encouraged weight maintenance  Next Visit: Remote next month to monitor protein choices and any NIS  Carleen Chary, RDN, LDN Registered Dietitian, [facility] Part Time Remote (Usual office hours: Tuesday-Thursday) Mobile: [phone]

## 2024-03-17 ENCOUNTER — Ambulatory Visit: Admitting: Family

## 2024-03-17 ENCOUNTER — Ambulatory Visit: Payer: Self-pay | Admitting: Family

## 2024-03-17 ENCOUNTER — Encounter: Payer: Self-pay | Admitting: Family

## 2024-03-17 VITALS — BP 111/58 | HR 79 | Temp 98.6°F | Resp 16 | Ht 67.0 in | Wt 157.0 lb

## 2024-03-17 DIAGNOSIS — D509 Iron deficiency anemia, unspecified: Secondary | ICD-10-CM

## 2024-03-17 DIAGNOSIS — R972 Elevated prostate specific antigen [PSA]: Secondary | ICD-10-CM

## 2024-03-17 DIAGNOSIS — I712 Thoracic aortic aneurysm, without rupture, unspecified: Secondary | ICD-10-CM

## 2024-03-17 DIAGNOSIS — Z125 Encounter for screening for malignant neoplasm of prostate: Secondary | ICD-10-CM

## 2024-03-17 DIAGNOSIS — K219 Gastro-esophageal reflux disease without esophagitis: Secondary | ICD-10-CM

## 2024-03-17 DIAGNOSIS — S065XAA Traumatic subdural hemorrhage with loss of consciousness status unknown, initial encounter: Secondary | ICD-10-CM

## 2024-03-17 DIAGNOSIS — R911 Solitary pulmonary nodule: Secondary | ICD-10-CM

## 2024-03-17 DIAGNOSIS — I10 Essential (primary) hypertension: Secondary | ICD-10-CM | POA: Diagnosis not present

## 2024-03-17 DIAGNOSIS — E782 Mixed hyperlipidemia: Secondary | ICD-10-CM

## 2024-03-17 DIAGNOSIS — C169 Malignant neoplasm of stomach, unspecified: Secondary | ICD-10-CM

## 2024-03-17 LAB — PSA: PSA: 2.45 ng/mL (ref 0.10–4.00)

## 2024-03-17 MED ORDER — LABETALOL HCL 100 MG PO TABS: 100.0000 mg | ORAL_TABLET | Freq: Two times a day (BID) | ORAL | Status: DC

## 2024-03-17 MED ORDER — LOSARTAN POTASSIUM 25 MG PO TABS
25.0000 mg | ORAL_TABLET | Freq: Every day | ORAL | 1 refills | Status: DC
Start: 2024-03-17 — End: 2024-05-18

## 2024-03-17 NOTE — Patient Instructions (Addendum)
 VISIT SUMMARY:  Today, you had a follow-up visit to discuss your ongoing chemotherapy treatment for stomach cancer. You have completed three chemotherapy sessions and have one more scheduled before transitioning to radiation therapy. Your blood pressure and other health conditions were also reviewed.  YOUR PLAN:  ADENOCARCINOMA OF THE STOMACH: You are undergoing chemotherapy for stomach cancer and have shown symptom improvement, which may indicate tumor reduction. You chose not to have surgery to maintain your quality of life. -Continue with your chemotherapy as planned. -After your next chemotherapy session, you will transition to radiation therapy.  HYPERTENSION: Your blood pressure has been generally well-controlled, though it has occasionally spiked due to chemotherapy-related steroids. You are currently taking losartan  and labetalol . -Continue taking losartan  25 mg tablets. -Continue taking labetalol  one tablet twice daily. -If your blood pressure increases, revert to taking 50 mg of losartan .  ELEVATED PSA: Your PSA levels have normalized since 2023 -A PSA test has been ordered to monitor your levels.  GENERAL HEALTH MAINTENANCE: You are on atorvastatin  and following a heart-healthy diet, though you struggle with consuming red meat. -Continue taking atorvastatin . -Maintain your heart-healthy diet.

## 2024-03-17 NOTE — Assessment & Plan Note (Signed)
 He is undergoing chemotherapy which will be followed by radiation. He has opted against surgery for quality of life reasons.

## 2024-03-17 NOTE — Assessment & Plan Note (Signed)
 Stable on pantoprazole

## 2024-03-17 NOTE — Assessment & Plan Note (Signed)
 Lab Results  Component Value Date   CHOL 172 12/15/2023   HDL 44.00 12/15/2023   LDLCALC 112 (H) 12/15/2023   TRIG 79.0 12/15/2023   CHOLHDL 4 12/15/2023   Stable on atorvastatin , continue same.

## 2024-03-17 NOTE — Assessment & Plan Note (Signed)
 This is being managed by hematology.   ?

## 2024-03-17 NOTE — Assessment & Plan Note (Addendum)
 Incidental finding 5.1 mm RML, noted on lung cancer screening at [facility]. Rec 6 month follow up CT in November.

## 2024-03-17 NOTE — Progress Notes (Signed)
 Subjective:     Patient ID: Matthew Lee, male    DOB: 11/07/1955, 68 y.o.   MRN: [id number]  Chief Complaint  Patient presents with   Hypertension    Here for follow up    HPI  Discussed the use of AI scribe software for clinical note transcription with the patient, who gave verbal consent to proceed.  History of Present Illness   Author [place] Matthew Lee is a 68 year old male with adenocarcinoma of the stomach who presents for follow-up on chemotherapy treatment.  He has completed three chemotherapy treatments with one more scheduled before transitioning to radiation therapy. He chose not to undergo surgery to maintain his quality of life. Since starting treatment, he no longer experiences dysphagia and has gained weight.  He is on losartan  and labetalol  for hypertension. His blood pressure has been lower than usual, with occasional spikes post-chemotherapy. He takes labetalol  twice daily.  He takes Effexor for mood and Keppra  for cognitive function, both of which he finds beneficial. He is also on atorvastatin  and is attempting to follow a heart-healthy diet, though he struggles with consuming red meat. He experiences hair loss, likely due to chemotherapy, but does not have nausea.    Health Maintenance Due  Topic Date Due   COVID-19 Vaccine (3 - Moderna risk series) 01/24/2020   DTaP/Tdap/Td (2 - Td or Tdap) 03/16/2023   Medicare Annual Wellness (AWV)  06/03/2023    Past Medical History:  Diagnosis Date   Abnormal weight loss 11/02/2022   Aneurysm of thoracic aorta (HCC) 09/2022   dilated to 4 cm on CT   Cholelithiasis 11/02/2022   Colon cancer screening 07/31/2022   Coronary artery calcification 10/08/2022   Diverticulitis    Dyspnea on exertion 11/11/2022   Elevated PSA 07/31/2022   Essential hypertension 09/26/2009   Qualifier: Diagnosis of   By: Luster Salters MD, Sheilda Deputy 11/11/2022   Gall bladder polyp 02/18/2015   Overview:   - 6 mm (as described from US  abd in  2015). Repeat Us  abd lim ordered.   GERD (gastroesophageal reflux disease) 09/07/2013   Hepatitis C 08/24/2012   TX and cured per pt   History of COVID-19 07/31/2022   History of hepatitis C 11/11/2022   History of tobacco abuse    Hyperglycemia 11/22/2014   Hypertension    long standing since 2007, was treated for sometime, but then discontinued taking meds on his own.   Immunization counseling 04/01/2022   Insomnia 10/31/2009   Qualifier: Diagnosis of   By: Evlyn Hoffmann MD, Amanjot       Mild diastolic dysfunction 08/22/2012   Mixed dyslipidemia 11/11/2022   Preventative health care 12/31/2014   Prominent abdominal aortic pulsation 11/11/2022   Pulmonary edema, acute (HCC)    hospitalized in 09/2009 with hypertensive crisis and acute pulmonary edema- improved with lasix  nad labetalol .    Past Surgical History:  Procedure Laterality Date   CRANIOTOMY Right 01/25/2024   Procedure: CRANIOTOMY HEMATOMA EVACUATION SUBDURAL;  Surgeon: Van Gelinas, MD;  Location: [facility] OR;  Service: Neurosurgery;  Laterality: Right;   ESOPHAGOGASTRODUODENOSCOPY  08/21/2011   Procedure: ESOPHAGOGASTRODUODENOSCOPY (EGD);  Surgeon: Tami Falcon, MD;  Location: [facility];  Service: Endoscopy;  Laterality: N/A;   ESOPHAGOGASTRODUODENOSCOPY N/A 02/07/2024   Procedure: EGD (ESOPHAGOGASTRODUODENOSCOPY);  Surgeon: Normie Becton., MD;  Location: [facility];  Service: Gastroenterology;  Laterality: N/A;   EUS N/A 02/07/2024   Procedure: ULTRASOUND, UPPER GI TRACT, ENDOSCOPIC;  Surgeon:  Mansouraty, Albino Alu., MD;  Location: [facility];  Service: Gastroenterology;  Laterality: N/A;   IR IMAGING GUIDED PORT INSERTION  02/14/2024   IR US  LIVER BIOPSY  01/28/2024   TONSILLECTOMY      Family History  Problem Relation Age of Onset   Polycystic kidney disease Father    Polycystic kidney disease Half-Brother    Polycystic kidney disease Half-Brother    Polycystic kidney disease Half-Sister    Colon cancer  Neg Hx    Esophageal cancer Neg Hx    Stomach cancer Neg Hx    Pancreatic cancer Neg Hx     Social History   Socioeconomic History   Marital status: Married    Spouse name: Not on file   Number of children: Not on file   Years of education: Not on file   Highest education level: Some college, no degree  Occupational History   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   Occupation: Garment/textile technologist: [employer]  Tobacco Use   Smoking status: Former    Current packs/day: 0.00    Average packs/day: 1 pack/day for 40.0 years (40.0 ttl pk-yrs)    Types: Cigarettes    Start date: 09/23/1969    Quit date: 09/23/2009    Years since quitting: 14.4   Smokeless tobacco: Never  Vaping Use   Vaping status: Never Used  Substance and Sexual Activity   Alcohol use: Not Currently    Comment: Former drinker.   Drug use: No   Sexual activity: Not Currently  Other Topics Concern   Not on file  Social History Narrative   Lives at home with wife, daughter and her 2 children.   Caffeine use:  1 daily   Works a Aeronautical engineer (Museum/gallery conservator)   Enjoys Optician, dispensing, Civil engineer, contracting.         Social Drivers of Corporate investment banker Strain: Low Risk  (03/14/2024)   Overall Financial Resource Strain (CARDIA)    Difficulty of Paying Living Expenses: Not hard at all  Food Insecurity: No Food Insecurity (03/14/2024)   Hunger Vital Sign    Worried About Running Out of Food in the Last Year: Never true    Ran Out of Food in the Last Year: Never true  Transportation Needs: No Transportation Needs (03/14/2024)   PRAPARE - Administrator, Civil Service (Medical): No    Lack of Transportation (Non-Medical): No  Physical Activity: Inactive (03/14/2024)   Exercise Vital Sign    Days of Exercise per Week: 0 days    Minutes of Exercise per Session: Not on file  Stress: No Stress Concern Present (03/14/2024)   [employer] of Occupational  Health - Occupational Stress Questionnaire    Feeling of Stress: Not at all  Recent Concern: Stress - Stress Concern Present (12/15/2023)   [employer] of Occupational Health - Occupational Stress Questionnaire    Feeling of Stress : To some extent  Social Connections: Socially Isolated (03/14/2024)   Social Connection and Isolation Panel    Frequency of Communication with Friends and Family: Once a week    Frequency of Social Gatherings with Friends and Family: Once a week    Attends Religious Services: Never    Database administrator or Organizations: No    Attends Banker Meetings: Not on file    Marital Status: Married  Intimate Partner Violence: Not At Risk (01/25/2024)   Humiliation, Afraid, Rape, and  Kick questionnaire    Fear of Current or Ex-Partner: No    Emotionally Abused: No    Physically Abused: No    Sexually Abused: No    Outpatient Medications Prior to Visit  Medication Sig Dispense Refill   amLODipine  (NORVASC ) 10 MG tablet TAKE 1 TABLET DAILY 90 tablet 3   atorvastatin  (LIPITOR) 10 MG tablet Take 1 tablet (10 mg total) by mouth daily. 90 tablet 3   dexamethasone  (DECADRON ) 4 MG tablet Take 2 tablets (8 mg total) by mouth daily. Start the day after chemotherapy for 2 days. Take with food. 30 tablet 1   levETIRAcetam  (KEPPRA ) 500 MG tablet Take 1 tablet (500 mg total) by mouth 2 (two) times daily. 60 tablet 2   ondansetron  (ZOFRAN ) 8 MG tablet Take 1 tablet (8 mg total) by mouth every 8 (eight) hours as needed for nausea or vomiting. Start on the third day after chemotherapy. 30 tablet 1   pantoprazole  (PROTONIX ) 40 MG tablet Take 1 tablet (40 mg total) by mouth 2 (two) times daily. 60 tablet 2   prochlorperazine  (COMPAZINE ) 10 MG tablet Take 1 tablet (10 mg total) by mouth every 6 (six) hours as needed for nausea or vomiting. 30 tablet 1   venlafaxine (EFFEXOR) 37.5 MG tablet Take 1 tablet (37.5 mg total) by mouth daily. 14 tablet 3   labetalol   (NORMODYNE ) 100 MG tablet TAKE ONE AND ONE-HALF TABLETS IN THE MORNING AND AT BEDTIME 270 tablet 1   losartan  (COZAAR ) 50 MG tablet Take 1 tablet (50 mg total) by mouth daily.     OVER THE COUNTER MEDICATION Take 1 tablet by mouth daily. Iron supplement with Vitamin C, Folate, Vitemin B12, and beet extract     No facility-administered medications prior to visit.    No Known Allergies  ROS See HPI    Objective:    Physical Exam Constitutional:      General: He is not in acute distress.    Appearance: He is well-developed.  HENT:     Head: Normocephalic and atraumatic.     Comments: Scalp incision is well healed  Cardiovascular:     Rate and Rhythm: Normal rate and regular rhythm.     Heart sounds: No murmur heard. Pulmonary:     Effort: Pulmonary effort is normal. No respiratory distress.     Breath sounds: Normal breath sounds. No wheezing or rales.   Musculoskeletal:        General: No swelling.   Skin:    General: Skin is warm and dry.   Neurological:     Mental Status: He is alert and oriented to person, place, and time.   Psychiatric:        Behavior: Behavior normal.        Thought Content: Thought content normal.      BP (!) 111/58 (BP Location: Right Arm, Patient Position: Sitting, Cuff Size: Small)   Pulse 79   Temp 98.6 F (37 C) (Oral)   Resp 16   Ht 5' 7 (1.702 m)   Wt 157 lb (71.2 kg)   SpO2 95%   BMI 24.59 kg/m  Wt Readings from Last 3 Encounters:  03/17/24 157 lb (71.2 kg)  03/15/24 155 lb (70.3 kg)  03/01/24 150 lb (68 kg)       Assessment & Plan:   Problem List Items Addressed This Visit       Unprioritized   Subdural hematoma (HCC) - Primary   S/p evacuation.  Now  back to mental status baseline.      Pulmonary nodule   Incidental finding 5.1 mm RML, noted on lung cancer screening at [facility]. Rec 6 month follow up CT in November.      Mixed dyslipidemia   Lab Results  Component Value Date   CHOL 172 12/15/2023   HDL 44.00  12/15/2023   LDLCALC 112 (H) 12/15/2023   TRIG 79.0 12/15/2023   CHOLHDL 4 12/15/2023   Stable on atorvastatin , continue same.       Iron deficiency anemia   This is being managed by hematology.       Hypertension   Appears over treated.  Decrease losartan  from 50 mg to 25 mg.       Relevant Medications   labetalol  (NORMODYNE ) 100 MG tablet   losartan  (COZAAR ) 25 MG tablet   GERD (gastroesophageal reflux disease)   Stable on pantoprazole .      Gastric adenocarcinoma (HCC)   He is undergoing chemotherapy which will be followed by radiation. He has opted against surgery for quality of life reasons.       Elevated PSA   Lab Results  Component Value Date   PSA 2.71 07/31/2022   PSA 4.46 (H) 06/17/2022   PSA 9.28 (H) 05/26/2022   Update PSA.       Aneurysm of thoracic aorta (HCC)   Needs repeat  CT 1/26.      Relevant Medications   labetalol  (NORMODYNE ) 100 MG tablet   losartan  (COZAAR ) 25 MG tablet   Other Visit Diagnoses       Screening for prostate cancer       Relevant Orders   PSA (Completed)       I have discontinued Robbi Childs. Lazard's losartan  and OVER THE COUNTER MEDICATION. I have also changed his labetalol . Additionally, I am having him start on losartan . Lastly, I am having him maintain his amLODipine , atorvastatin , pantoprazole , levETIRAcetam , dexamethasone , ondansetron , prochlorperazine , and venlafaxine.  Meds ordered this encounter  Medications   labetalol  (NORMODYNE ) 100 MG tablet    Sig: Take 1 tablet (100 mg total) by mouth 2 (two) times daily.    Supervising Provider:   Randie Bustle A [[id number]]   losartan  (COZAAR ) 25 MG tablet    Sig: Take 1 tablet (25 mg total) by mouth daily.    Dispense:  90 tablet    Refill:  1    Supervising Provider:   Randie Bustle A [[id number]]

## 2024-03-17 NOTE — Assessment & Plan Note (Addendum)
 Needs repeat  CT 1/26.

## 2024-03-17 NOTE — Assessment & Plan Note (Signed)
 S/p evacuation.  Now back to mental status baseline.

## 2024-03-17 NOTE — Assessment & Plan Note (Addendum)
 Appears over treated.  Decrease losartan  from 50 mg to 25 mg.

## 2024-03-17 NOTE — Assessment & Plan Note (Signed)
 Lab Results  Component Value Date   PSA 2.71 07/31/2022   PSA 4.46 (H) 06/17/2022   PSA 9.28 (H) 05/26/2022   Update PSA.

## 2024-03-17 NOTE — Assessment & Plan Note (Addendum)
 Matthew Lee

## 2024-03-21 ENCOUNTER — Encounter: Payer: Self-pay | Admitting: *Deleted

## 2024-03-21 NOTE — Progress Notes (Signed)
 Patient is doing well with treatment. Minimal side effects and pre-treatment symptoms are improving. He proceeded with cycle three of treatment.   Oncology Nurse Navigator Documentation     03/21/2024   12:00 PM  Oncology Nurse Navigator Flowsheets  Navigator Follow Up Date: 03/29/2024  Navigator Follow Up Reason: Follow-up Appointment;Chemotherapy  Navigator Location [facility]  Navigator Encounter Type Appt/Treatment Plan Review  Patient Visit Type MedOnc  Treatment Phase Active Tx  Barriers/Navigation Needs Coordination of Care;Education  Interventions None Required  Acuity Level 2-Minimal Needs (1-2 Barriers Identified)  Support Groups/Services Friends and Family  Time Spent with Patient 15

## 2024-03-24 ENCOUNTER — Ambulatory Visit: Admitting: Thoracic Surgery (Cardiothoracic Vascular Surgery)

## 2024-03-27 ENCOUNTER — Encounter: Payer: Self-pay | Admitting: Speech Pathology

## 2024-03-27 ENCOUNTER — Ambulatory Visit: Payer: Self-pay | Attending: Pulmonary Disease | Admitting: Speech Pathology

## 2024-03-27 DIAGNOSIS — R41841 Cognitive communication deficit: Secondary | ICD-10-CM | POA: Insufficient documentation

## 2024-03-27 NOTE — Therapy (Signed)
 OUTPATIENT SPEECH LANGUAGE PATHOLOGY TREATMENT & DISCHARGE SUMMARY   Patient Name: Matthew Lee MRN: [id number] DOB:April 08, 1956, 68 y.o., male Today's Date: 03/27/2024  PCP: MALVA Floretta Setter, NP REFERRING PROVIDER: Ilah Corean HERO, PA-C  SPEECH THERAPY DISCHARGE SUMMARY  Visits from Start of Care: 5  Current functional level related to goals / functional outcomes: Unsure, as patient didn't participate in 3 tx sessions. Pt reports he is back to baseline.    Remaining deficits: Reports back to baseline   Education / Equipment: Completed   Patient agrees to discharge. Patient goals were partially met. Patient is being discharged due to being pleased with the current functional level..  END OF SESSION:  End of Session - 03/27/24 0937     Visit Number 5    Number of Visits 17    Date for SLP Re-Evaluation 04/10/24    SLP Start Time 0930    Activity Tolerance Patient tolerated treatment well          Past Medical History:  Diagnosis Date   Abnormal weight loss 11/02/2022   Aneurysm of thoracic aorta (HCC) 09/2022   dilated to 4 cm on CT   Cholelithiasis 11/02/2022   Colon cancer screening 07/31/2022   Coronary artery calcification 10/08/2022   Diverticulitis    Dyspnea on exertion 11/11/2022   Elevated PSA 07/31/2022   Essential hypertension 09/26/2009   Qualifier: Diagnosis of   By: Janit MD, Ozell Sevin 11/11/2022   Gall bladder polyp 02/18/2015   Overview:   - 6 mm (as described from US  abd in 2015). Repeat Us  abd lim ordered.   GERD (gastroesophageal reflux disease) 09/07/2013   Hepatitis C 08/24/2012   TX and cured per pt   History of COVID-19 07/31/2022   History of hepatitis C 11/11/2022   History of tobacco abuse    Hyperglycemia 11/22/2014   Hypertension    long standing since 2007, was treated for sometime, but then discontinued taking meds on his own.   Immunization counseling 04/01/2022   Insomnia 10/31/2009   Qualifier:  Diagnosis of   By: Arvella MD, Amanjot       Mild diastolic dysfunction 08/22/2012   Mixed dyslipidemia 11/11/2022   Preventative health care 12/31/2014   Prominent abdominal aortic pulsation 11/11/2022   Pulmonary edema, acute (HCC)    hospitalized in 09/2009 with hypertensive crisis and acute pulmonary edema- improved with lasix  nad labetalol .   Past Surgical History:  Procedure Laterality Date   CRANIOTOMY Right 01/25/2024   Procedure: CRANIOTOMY HEMATOMA EVACUATION SUBDURAL;  Surgeon: Debby Dorn MATSU, MD;  Location: [facility] OR;  Service: Neurosurgery;  Laterality: Right;   ESOPHAGOGASTRODUODENOSCOPY  08/21/2011   Procedure: ESOPHAGOGASTRODUODENOSCOPY (EGD);  Surgeon: Renaye Sous, MD;  Location: [facility];  Service: Endoscopy;  Laterality: N/A;   ESOPHAGOGASTRODUODENOSCOPY N/A 02/07/2024   Procedure: EGD (ESOPHAGOGASTRODUODENOSCOPY);  Surgeon: Wilhelmenia Aloha Raddle., MD;  Location: [facility];  Service: Gastroenterology;  Laterality: N/A;   EUS N/A 02/07/2024   Procedure: ULTRASOUND, UPPER GI TRACT, ENDOSCOPIC;  Surgeon: Wilhelmenia Aloha Raddle., MD;  Location: [facility];  Service: Gastroenterology;  Laterality: N/A;   IR IMAGING GUIDED PORT INSERTION  02/14/2024   IR US  LIVER BIOPSY  01/28/2024   TONSILLECTOMY     Patient Active Problem List   Diagnosis Date Noted   Gastric adenocarcinoma (HCC) 02/07/2024   Enlarged lymph nodes 02/07/2024   GE junction carcinoma (HCC) 02/04/2024   Pulmonary nodule 02/04/2024   Subdural hematoma (HCC) 01/25/2024  Iron deficiency anemia 12/18/2023   Ex-smoker 11/11/2022   History of hepatitis C 11/11/2022   Mixed dyslipidemia 11/11/2022   Prominent abdominal aortic pulsation 11/11/2022   Diverticulitis 11/02/2022   Pulmonary edema, acute (HCC) 11/02/2022   Cholelithiasis 11/02/2022   Coronary artery calcification 10/08/2022   Aneurysm of thoracic aorta (HCC) 09/2022   Elevated PSA 07/31/2022   History of COVID-19 07/31/2022   Immunization  counseling 04/01/2022   Gall bladder polyp 02/18/2015   Preventative health care 12/31/2014   Hyperglycemia 11/22/2014   GERD (gastroesophageal reflux disease) 09/07/2013   Hepatitis C 08/24/2012   Mild diastolic dysfunction 08/22/2012   Insomnia 10/31/2009   History of tobacco abuse 09/26/2009   Hypertension 09/26/2009    ONSET DATE: 01/25/24   REFERRING DIAG: D93.5XAA (ICD-10-CM) - Subdural hematoma (HCC)  THERAPY DIAG:  Cognitive communication deficit  Rationale for Evaluation and Treatment: Rehabilitation  SUBJECTIVE:   SUBJECTIVE STATEMENT: Pt reports chemo begins tomorrow.  Pt accompanied by: self  PERTINENT HISTORY: Per chart review:  PMHx significant for HTN, HLD, CAD, HFpEF, thoracic aortic aneurysm, chronic HCV s/p treatment, IDA, SDH  PAIN:  Are you having pain? No  FALLS: Has patient fallen in last 6 months?  Yes; 1 (after this hospital stay)   LIVING ENVIRONMENT: Lives with: lives with their spouse and and two grandsons Lysbeth)  Lives in: House/apartment  PLOF:  Level of assistance: Independent with ADLs, Independent with IADLs Employment: Full-time employment - goal is to return to [employer] employment   PATIENT GOALS: memory, attention/focus   OBJECTIVE:  Note: Objective measures were completed at Evaluation unless otherwise noted.  DIAGNOSTIC FINDINGS: Per chart review:   CT HEAD WO CONTRAST 01/25/2024  IMPRESSION: 1. Large and mostly isodense Right Subdural Hematoma, estimated up to 23 mm thickness, and probably septated. 2. Intracranial mass effect with leftward midline shift of 9 mm. 3. No skull fracture or other acute intracranial abnormality identified.   Electronically Signed: By: VEAR Hurst M.D. On: 01/25/2024 12:55  COGNITION: Overall cognitive status: Impaired Areas of impairment:  Attention: Impaired: Selective, Alternating, Divided Memory: Impaired: Short term Awareness: Impaired: Emergent Executive function: Impaired: Problem solving,  Organization, Planning, Error awareness, and Self-correction Functional deficits: Pt and wife had difficulty identifying problems, though report he has some difficulty remembering information. Pt reports he has trouble focusing.   COGNITIVE COMMUNICATION: Following directions: Follows multi-step commands inconsistently  Auditory comprehension: Impaired: Likely 2/2 to attention Verbal expression: Impaired: Notes some difficulty with expressing thoughts  Functional communication: Impaired: Communicating needs/wants effectively, decreased ability to recall details from conversations   ORAL MOTOR EXAMINATION: Overall status: Did not assess Comments: NA  STANDARDIZED ASSESSMENTS: Initiated CLQT - to complete next session    Cognitive Linguistic Quick Test: AGE - 18 - 69   The Cognitive Linguistic Quick Test (CLQT) was administered to assess the relative status of five cognitive domains: attention, memory, language, executive functioning, and visuospatial skills. Scores from 10 tasks were used to estimate severity ratings (standardized for age groups 18-69 years and 70-89 years) for each domain, a clock drawing task, as well as an overall composite severity rating of cognition.       Task Score Criterion Cut Scores  Personal Facts 8/8 8  Symbol Cancellation 11/12 11  Confrontation Naming 10/10 10  Clock Drawing  5/13 12  Story Retelling 8/10 6  Symbol Trails 2/10 9  Generative Naming 3/9 5  Design Memory 5/6 5  Mazes  4/8 7  Design Generation 4/13 6  Cognitive Domain Composite Score Severity Rating  Attention 151/215 Mild  Memory 157/185 WNL  Executive Function 13/40 Severe  Language 29/37 WNL  Visuospatial Skills 62/105 Mild  Clock Drawing  5/13 Severe  Composite Severity Rating  Mild      PATIENT REPORTED OUTCOME MEASURES (PROM): Cognitive Functioning Neuro QOL: 104/140  Cognitive Functioning Neuro QOL: 138/140 which indicates virtually no perceived difficulty  with cognitive skills.                                                                                                                            TREATMENT DATE:   03/27/24: Pt was seen for discharge today. Pt reports he is back at baseline - wife is in agreement. Pt completed Cognitive Functioning Neuro QOL - see above. Pt improved scores.   02/22/24: Pt was seen for skilled ST services targeting cognitive-communication. Pt reports returning to work and driving. SLP encouraged pt to speak with his neurologist to get clearance for those tasks. Pt reports he has not heard from neurologist, does not have an upcoming appt and still needs to have his staples removed. Pt reports he is to make an appt with their office. Pt feels he does not have any cognitive deficits at this time - SLP reviewed his CLQT which identified impairments. We decided to place him on hold where pt can attempt to increase awareness of deficits as he begins to be challenged more, cognitively. Pt is in agreement. To place on hold for one month. SLP provided him with d/c packet which holds information on impairments to look for.    02/15/24: Pt was seen for skilled ST services targeting cognitive-communication. Pt reports stress and overwhelm with information provided by his oncologist today. Pt reports he is to begin chemo tomorrow. SLP educated on relaxation exercises via Relaxation after BI. Pt verbalized activities that bring him joy and keep is mind active. SLP demonstrated use of breathing exercises re: 4:7:8, muscle relaxation, belly breathing, and body scan. Pt reports he would like to try muscle relaxation as a strategy to manage stress. To begin attention strategies next session.   02/10/24: Pt was seen for skilled ST services targeting continued assessment. Pt completed CLQT this session and Cognitive Function PROMs. See above in blue. Pt reports some stress with cognitive function and CA dx. To provide with relaxation  strategies. Goals have been updated.    PATIENT EDUCATION: Education details: SLP role in cognitive-communication Person educated: Patient and Spouse Education method: Explanation Education comprehension: verbalized understanding and needs further education   GOALS: Goals reviewed with patient? Yes  SHORT TERM GOALS: Target date: 03/11/24  Pt will complete CLQT and goals will be updated Baseline:MET  2.  Pt will complete Cognitive PROM Baseline:  Goal status: MET  3.  Pt will verbalize 3 memory strategies to be used in recall of important information Baseline:  Goal status: DEFERRED  4.  Pt will verbalize 3 strategies to increase focus on  cognitive tasks Baseline:  Goal status: DEFERRED    LONG TERM GOALS: Target date: 04/10/24  Pt will improve score on PROMS Baseline:  Goal status: MET  2.  Pt/wife will report successful use of memory strategies at home Baseline:  Goal status: DEFERRED  3.  Pt/wife will report successful use of attention strategies at home Baseline:  Goal status: DEFERRED    ASSESSMENT:  CLINICAL IMPRESSION: Pt is a 68 yo male who presents to ST OP for evaluation post SDH. See tx note. Pt to discharge from SLP services today. He reports he is back to baseline and would like to discharge.     OBJECTIVE IMPAIRMENTS: include attention, memory, awareness, and executive functioning. These impairments are limiting patient from return to work, managing medications, managing appointments, managing finances, household responsibilities, ADLs/IADLs, and effectively communicating at home and in community. Factors affecting potential to achieve goals and functional outcome are NA.SABRA Patient will benefit from skilled SLP services to address above impairments and improve overall function.  REHAB POTENTIAL: Good  PLAN:  SLP FREQUENCY: 1-2x/week  SLP DURATION: 8 weeks  PLANNED INTERVENTIONS: Environmental controls, Cueing hierachy, Internal/external  aids, Functional tasks, SLP instruction and feedback, Compensatory strategies, Patient/family education, and [id number] Treatment of speech (30 or 45 min)     [employer], CCC-SLP 03/27/2024, 9:38 AM

## 2024-03-28 ENCOUNTER — Encounter: Payer: Self-pay | Admitting: Internal Medicine

## 2024-03-29 ENCOUNTER — Inpatient Hospital Stay

## 2024-03-29 ENCOUNTER — Encounter: Payer: Self-pay | Admitting: *Deleted

## 2024-03-29 ENCOUNTER — Inpatient Hospital Stay: Attending: Hematology & Oncology

## 2024-03-29 ENCOUNTER — Encounter: Payer: Self-pay | Admitting: Hematology & Oncology

## 2024-03-29 ENCOUNTER — Other Ambulatory Visit: Payer: Self-pay

## 2024-03-29 ENCOUNTER — Encounter: Payer: Self-pay | Admitting: Family

## 2024-03-29 ENCOUNTER — Inpatient Hospital Stay: Admitting: Hematology & Oncology

## 2024-03-29 VITALS — BP 104/57 | HR 72 | Temp 98.1°F | Resp 18 | Ht 67.0 in | Wt 157.0 lb

## 2024-03-29 DIAGNOSIS — D509 Iron deficiency anemia, unspecified: Secondary | ICD-10-CM | POA: Diagnosis not present

## 2024-03-29 DIAGNOSIS — Z5111 Encounter for antineoplastic chemotherapy: Secondary | ICD-10-CM | POA: Diagnosis not present

## 2024-03-29 DIAGNOSIS — C160 Malignant neoplasm of cardia: Secondary | ICD-10-CM | POA: Insufficient documentation

## 2024-03-29 DIAGNOSIS — Z7952 Long term (current) use of systemic steroids: Secondary | ICD-10-CM | POA: Diagnosis not present

## 2024-03-29 DIAGNOSIS — Z79899 Other long term (current) drug therapy: Secondary | ICD-10-CM | POA: Insufficient documentation

## 2024-03-29 DIAGNOSIS — C169 Malignant neoplasm of stomach, unspecified: Secondary | ICD-10-CM

## 2024-03-29 DIAGNOSIS — Z5189 Encounter for other specified aftercare: Secondary | ICD-10-CM | POA: Insufficient documentation

## 2024-03-29 LAB — CBC WITH DIFFERENTIAL (CANCER CENTER ONLY)
Abs Immature Granulocytes: 0.26 10*3/uL — ABNORMAL HIGH (ref 0.00–0.07)
Basophils Absolute: 0.1 10*3/uL (ref 0.0–0.1)
Basophils Relative: 1 %
Eosinophils Absolute: 0.1 10*3/uL (ref 0.0–0.5)
Eosinophils Relative: 1 %
HCT: 37.4 % — ABNORMAL LOW (ref 39.0–52.0)
Hemoglobin: 11.9 g/dL — ABNORMAL LOW (ref 13.0–17.0)
Immature Granulocytes: 2 %
Lymphocytes Relative: 11 %
Lymphs Abs: 1.5 10*3/uL (ref 0.7–4.0)
MCH: 25.1 pg — ABNORMAL LOW (ref 26.0–34.0)
MCHC: 31.8 g/dL (ref 30.0–36.0)
MCV: 78.7 fL — ABNORMAL LOW (ref 80.0–100.0)
Monocytes Absolute: 1.2 10*3/uL — ABNORMAL HIGH (ref 0.1–1.0)
Monocytes Relative: 9 %
Neutro Abs: 10.5 10*3/uL — ABNORMAL HIGH (ref 1.7–7.7)
Neutrophils Relative %: 76 %
Platelet Count: 207 10*3/uL (ref 150–400)
RBC: 4.75 MIL/uL (ref 4.22–5.81)
RDW: 22.3 % — ABNORMAL HIGH (ref 11.5–15.5)
WBC Count: 13.6 10*3/uL — ABNORMAL HIGH (ref 4.0–10.5)
nRBC: 0 % (ref 0.0–0.2)

## 2024-03-29 LAB — RETICULOCYTES
Immature Retic Fract: 9.1 % (ref 2.3–15.9)
RBC.: 4.72 MIL/uL (ref 4.22–5.81)
Retic Count, Absolute: 100.5 10*3/uL (ref 19.0–186.0)
Retic Ct Pct: 2.1 % (ref 0.4–3.1)

## 2024-03-29 LAB — IRON AND IRON BINDING CAPACITY (CC-WL,HP ONLY)
Iron: 92 ug/dL (ref 45–182)
Saturation Ratios: 25 % (ref 17.9–39.5)
TIBC: 370 ug/dL (ref 250–450)
UIBC: 278 ug/dL (ref 117–376)

## 2024-03-29 LAB — CMP (CANCER CENTER ONLY)
ALT: 34 U/L (ref 0–44)
AST: 28 U/L (ref 15–41)
Albumin: 3.8 g/dL (ref 3.5–5.0)
Alkaline Phosphatase: 85 U/L (ref 38–126)
Anion gap: 7 (ref 5–15)
BUN: 26 mg/dL — ABNORMAL HIGH (ref 8–23)
CO2: 27 mmol/L (ref 22–32)
Calcium: 8.8 mg/dL — ABNORMAL LOW (ref 8.9–10.3)
Chloride: 103 mmol/L (ref 98–111)
Creatinine: 0.9 mg/dL (ref 0.61–1.24)
GFR, Estimated: >60 mL/min (ref >60)
Glucose, Bld: 159 mg/dL — ABNORMAL HIGH (ref 70–99)
Potassium: 3.8 mmol/L (ref 3.5–5.1)
Sodium: 137 mmol/L (ref 135–145)
Total Bilirubin: 0.4 mg/dL (ref 0.0–1.2)
Total Protein: 6.2 g/dL — ABNORMAL LOW (ref 6.5–8.1)

## 2024-03-29 LAB — FERRITIN: Ferritin: 413 ng/mL — ABNORMAL HIGH (ref 24–336)

## 2024-03-29 MED ORDER — SODIUM CHLORIDE 0.9% FLUSH: 10.0000 mL | INTRAVENOUS | Status: DC | PRN

## 2024-03-29 MED ORDER — SODIUM CHLORIDE 0.9 % IV SOLN
45.0000 mg/m2 | Freq: Once | INTRAVENOUS | Status: AC
  Administered 2024-03-29: 80 mg via INTRAVENOUS
  Filled 2024-03-29: qty 8

## 2024-03-29 MED ORDER — OXALIPLATIN CHEMO INJECTION 100 MG/20ML
76.5000 mg/m2 | Freq: Once | INTRAVENOUS | Status: AC
  Administered 2024-03-29: 150 mg via INTRAVENOUS
  Filled 2024-03-29: qty 20

## 2024-03-29 MED ORDER — LEUCOVORIN CALCIUM INJECTION 350 MG
196.0000 mg/m2 | Freq: Once | INTRAVENOUS | Status: AC
  Administered 2024-03-29: 350 mg via INTRAVENOUS
  Filled 2024-03-29: qty 17.5

## 2024-03-29 MED ORDER — PALONOSETRON HCL INJECTION 0.25 MG/5ML
0.2500 mg | Freq: Once | INTRAVENOUS | Status: AC
  Administered 2024-03-29: 0.25 mg via INTRAVENOUS
  Filled 2024-03-29: qty 5

## 2024-03-29 MED ORDER — SODIUM CHLORIDE 0.9 % IV SOLN
2200.0000 mg/m2 | INTRAVENOUS | Status: DC
  Administered 2024-03-29: 3950 mg via INTRAVENOUS
  Filled 2024-03-29: qty 79

## 2024-03-29 MED ORDER — DEXAMETHASONE SODIUM PHOSPHATE 10 MG/ML IJ SOLN
10.0000 mg | Freq: Once | INTRAMUSCULAR | Status: AC
  Administered 2024-03-29: 10 mg via INTRAVENOUS
  Filled 2024-03-29: qty 1

## 2024-03-29 MED ORDER — DEXTROSE 5 % IV SOLN: INTRAVENOUS | Status: DC

## 2024-03-29 NOTE — Progress Notes (Signed)
 Patient has had good treatment tolerance. He is back to work and feels like his health is pretty close to baseline. Patient will need PET prior to next cycle. Scheduled for 04/20/2024.  Patient is aware of PET appointment including date, time, and location. The following prep is reviewed with patient and confirmed with teachback: - arrive 30 minutes before appointment time - NPO except water for 6h before scan. No candy, no gum - hold any diabetic medication the morning of the scan - have a low carb dinner the night prior  Radiology Information sheet also reviewed and given  to patient for reinforcement of education.  [facility] also sent message regarding setting up follow up endoscopy.   Oncology Nurse Navigator Documentation     03/29/2024    8:15 AM  Oncology Nurse Navigator Flowsheets  Navigator Follow Up Date: 04/20/2024  Navigator Follow Up Reason: Scan Review  Navigator Location [facility]  Navigator Encounter Type Treatment  Patient Visit Type MedOnc  Treatment Phase Active Tx  Barriers/Navigation Needs Coordination of Care;Education  Education Other  Interventions Coordination of Care;Education  Acuity Level 2-Minimal Needs (1-2 Barriers Identified)  Coordination of Care Radiology  Education Method Verbal;Written  Support Groups/Services Friends and Family  Time Spent with Patient 30

## 2024-03-29 NOTE — Patient Instructions (Signed)

## 2024-03-29 NOTE — Patient Instructions (Signed)
 [facility] - A DEPT OF [facility]  Discharge Instructions: Thank you for choosing [facility] to provide your oncology and hematology care.   If you have a lab appointment with the Cancer Center, please go directly to the Cancer Center and check in at the registration area.  Wear comfortable clothing and clothing appropriate for easy access to any Portacath or PICC line.   We strive to give you quality time with your provider. You may need to reschedule your appointment if you arrive late (15 or more minutes).  Arriving late affects you and other patients whose appointments are after yours.  Also, if you miss three or more appointments without notifying the office, you may be dismissed from the clinic at the provider's discretion.      For prescription refill requests, have your pharmacy contact our office and allow 72 hours for refills to be completed.    Today you received the following chemotherapy and/or immunotherapy agents Taxotere , oxaliplatin , Lueko, 5Fu      To help prevent nausea and vomiting after your treatment, we encourage you to take your nausea medication as directed.  BELOW ARE SYMPTOMS THAT SHOULD BE REPORTED IMMEDIATELY: *FEVER GREATER THAN 100.4 F (38 C) OR HIGHER *CHILLS OR SWEATING *NAUSEA AND VOMITING THAT IS NOT CONTROLLED WITH YOUR NAUSEA MEDICATION *UNUSUAL SHORTNESS OF BREATH *UNUSUAL BRUISING OR BLEEDING *URINARY PROBLEMS (pain or burning when urinating, or frequent urination) *BOWEL PROBLEMS (unusual diarrhea, constipation, pain near the anus) TENDERNESS IN MOUTH AND THROAT WITH OR WITHOUT PRESENCE OF ULCERS (sore throat, sores in mouth, or a toothache) UNUSUAL Laughery, SWELLING OR PAIN  UNUSUAL VAGINAL DISCHARGE OR ITCHING   Items with * indicate a potential emergency and should be followed up as soon as possible or go to the Emergency Department if any problems should occur.  Please show the CHEMOTHERAPY ALERT  CARD or IMMUNOTHERAPY ALERT CARD at check-in to the Emergency Department and triage nurse. Should you have questions after your visit or need to cancel or reschedule your appointment, please contact [facility] - A DEPT OF [facility]  [phone] and follow the prompts.  Office hours are 8:00 a.m. to 4:30 p.m. Monday - Friday. Please note that voicemails left after 4:00 p.m. may not be returned until the following business day.  We are closed weekends and major holidays. You have access to a nurse at all times for urgent questions. Please call the main number to the clinic [phone] and follow the prompts.  For any non-urgent questions, you may also contact your provider using MyChart. We now offer e-Visits for anyone 6 and older to request care online for non-urgent symptoms. For details visit [email or web address].   Also download the MyChart app! Go to the app store, search MyChart, open the app, select [place], and log in with your MyChart username and password.

## 2024-03-29 NOTE — Progress Notes (Signed)
 Hematology and Oncology Follow Up Visit  Gaige Sebo Capano [id number] [birth year] 68 y.o. 03/29/2024   Principle Diagnosis:  Locally advanced adenocarcinoma of the stomach Iron deficiency anemia-GI bleed  Current Therapy:   Neoadjuvant FLOT-s/p cycle #2 - start on 02/17/2024 Feraheme given on 02/17/2024     Interim History:  Mr. Matthew Lee is back for follow-up.  He actually looks incredibly fit.  I must give a lot of credit for the perseverance that he has.  He has had great support from his wife.  He is also changed his diet little bit.  He is trying to get in more protein.  He is still working.  He really enjoys his job.  He worked 10 hours yesterday.  He has had no problems with bowels or bladder.  He has had no melena or bright red blood per rectum.  His last iron studies that we did back in June showed a ferritin of 715 with an iron saturation of 27%.  He has had no odynophagia.  There is been no dysphagia.  He has had no cough or shortness of breath.  He has had no fever.  He has had no leg swelling.  There has been no rashes.  His weight is still going up which is nice to see..  Overall, his performance status is probably ECOG 0.   Medications:  Current Outpatient Medications:    amLODipine  (NORVASC ) 10 MG tablet, TAKE 1 TABLET DAILY, Disp: 90 tablet, Rfl: 3   atorvastatin  (LIPITOR) 10 MG tablet, Take 1 tablet (10 mg total) by mouth daily., Disp: 90 tablet, Rfl: 3   dexamethasone  (DECADRON ) 4 MG tablet, Take 2 tablets (8 mg total) by mouth daily. Start the day after chemotherapy for 2 days. Take with food., Disp: 30 tablet, Rfl: 1   labetalol  (NORMODYNE ) 100 MG tablet, Take 1 tablet (100 mg total) by mouth 2 (two) times daily., Disp: , Rfl:    levETIRAcetam  (KEPPRA ) 500 MG tablet, Take 1 tablet (500 mg total) by mouth 2 (two) times daily., Disp: 60 tablet, Rfl: 2   losartan  (COZAAR ) 25 MG tablet, Take 1 tablet (25 mg total) by mouth daily., Disp: 90 tablet, Rfl: 1   ondansetron   (ZOFRAN ) 8 MG tablet, Take 1 tablet (8 mg total) by mouth every 8 (eight) hours as needed for nausea or vomiting. Start on the third day after chemotherapy., Disp: 30 tablet, Rfl: 1   pantoprazole  (PROTONIX ) 40 MG tablet, Take 1 tablet (40 mg total) by mouth 2 (two) times daily., Disp: 60 tablet, Rfl: 2   prochlorperazine  (COMPAZINE ) 10 MG tablet, Take 1 tablet (10 mg total) by mouth every 6 (six) hours as needed for nausea or vomiting., Disp: 30 tablet, Rfl: 1   venlafaxine  (EFFEXOR ) 37.5 MG tablet, Take 1 tablet (37.5 mg total) by mouth daily., Disp: 14 tablet, Rfl: 3  Allergies: No Known Allergies  Past Medical History, Surgical history, Social history, and Family History were reviewed and updated.  Review of Systems: Review of Systems  Constitutional: Negative.   HENT:  Negative.    Eyes: Negative.   Respiratory: Negative.    Cardiovascular: Negative.   Gastrointestinal: Negative.   Endocrine: Negative.   Genitourinary: Negative.    Musculoskeletal: Negative.   Skin: Negative.   Neurological: Negative.   Hematological: Negative.   Psychiatric/Behavioral: Negative.      Physical Exam: Vital signs show temperature of 98.1.  Pulse 72.  Blood pressure 104/57.  Weight is 157 pounds  Wt Readings from Last  3 Encounters:  03/17/24 157 lb (71.2 kg)  03/15/24 155 lb (70.3 kg)  03/01/24 150 lb (68 kg)    Physical Exam Vitals reviewed.  HENT:     Head: Normocephalic and atraumatic.     Comments: Head neck exam shows a dressing on the surgical scar on the right scalp. Eyes:     Pupils: Pupils are equal, round, and reactive to light.  Cardiovascular:     Rate and Rhythm: Normal rate and regular rhythm.     Heart sounds: Normal heart sounds.  Pulmonary:     Effort: Pulmonary effort is normal.     Breath sounds: Normal breath sounds.  Abdominal:     General: Bowel sounds are normal.     Palpations: Abdomen is soft.  Musculoskeletal:        General: No tenderness or deformity.  Normal range of motion.     Cervical back: Normal range of motion.  Lymphadenopathy:     Cervical: No cervical adenopathy.  Skin:    General: Skin is warm and dry.     Findings: No erythema or Landstrom.  Neurological:     Mental Status: He is alert and oriented to person, place, and time.  Psychiatric:        Behavior: Behavior normal.        Thought Content: Thought content normal.        Judgment: Judgment normal.      Lab Results  Component Value Date   WBC 13.6 (H) 03/29/2024   HGB 11.9 (L) 03/29/2024   HCT 37.4 (L) 03/29/2024   MCV 78.7 (L) 03/29/2024   PLT 207 03/29/2024     Chemistry      Component Value Date/Time   NA 140 03/15/2024 0805   K 4.1 03/15/2024 0805   CL 105 03/15/2024 0805   CO2 28 03/15/2024 0805   BUN 21 03/15/2024 0805   CREATININE 0.93 03/15/2024 0805   CREATININE 0.85 01/21/2018 1500      Component Value Date/Time   CALCIUM  9.1 03/15/2024 0805   ALKPHOS 105 03/15/2024 0805   AST 21 03/15/2024 0805   ALT 22 03/15/2024 0805   BILITOT 0.3 03/15/2024 0805      Impression and Plan: Mr. Moehring is a very nice 68 year old white male.  He has hopefully noted but localized adenocarcinoma of the stomach.  After this for cycle treatment, I will go ahead and see about doing another PET scan on him.  We will also see if Dr. Albertus [facility] will do an upper endoscopy so we can see how well the tumor has regressed.  We will then, hopefully plan for radiation therapy along with Xeloda.  I am just happy that he is doing so nicely.  He really has improved his overall quality of life since we first started treating him.  I will plan to probably get him back in a month now.  We will get our staging studies done.  Maude JONELLE Crease, MD 7/2/20258:08 AM

## 2024-03-29 NOTE — Telephone Encounter (Signed)
 Mychart message to pt about cologuard status

## 2024-03-30 ENCOUNTER — Encounter: Payer: Self-pay | Admitting: Hematology & Oncology

## 2024-03-30 ENCOUNTER — Inpatient Hospital Stay

## 2024-03-30 ENCOUNTER — Telehealth: Payer: Self-pay

## 2024-03-30 ENCOUNTER — Inpatient Hospital Stay: Admitting: Dietician

## 2024-03-30 VITALS — BP 127/73 | HR 79 | Temp 97.9°F | Resp 20

## 2024-03-30 DIAGNOSIS — C169 Malignant neoplasm of stomach, unspecified: Secondary | ICD-10-CM

## 2024-03-30 DIAGNOSIS — Z5111 Encounter for antineoplastic chemotherapy: Secondary | ICD-10-CM | POA: Diagnosis not present

## 2024-03-30 DIAGNOSIS — Z7952 Long term (current) use of systemic steroids: Secondary | ICD-10-CM | POA: Diagnosis not present

## 2024-03-30 DIAGNOSIS — Z5189 Encounter for other specified aftercare: Secondary | ICD-10-CM | POA: Diagnosis not present

## 2024-03-30 DIAGNOSIS — D490 Neoplasm of unspecified behavior of digestive system: Secondary | ICD-10-CM

## 2024-03-30 DIAGNOSIS — C160 Malignant neoplasm of cardia: Secondary | ICD-10-CM | POA: Diagnosis not present

## 2024-03-30 DIAGNOSIS — D509 Iron deficiency anemia, unspecified: Secondary | ICD-10-CM | POA: Diagnosis not present

## 2024-03-30 DIAGNOSIS — Z79899 Other long term (current) drug therapy: Secondary | ICD-10-CM | POA: Diagnosis not present

## 2024-03-30 MED ORDER — PEGFILGRASTIM-JMDB 6 MG/0.6ML ~~LOC~~ SOSY
6.0000 mg | PREFILLED_SYRINGE | Freq: Once | SUBCUTANEOUS | Status: AC
  Administered 2024-03-30: 6 mg via SUBCUTANEOUS
  Filled 2024-03-30: qty 0.6

## 2024-03-30 MED ORDER — SODIUM CHLORIDE 0.9% FLUSH
10.0000 mL | INTRAVENOUS | Status: DC | PRN
Start: 2024-03-30 — End: 2024-03-30
  Administered 2024-03-30: 10 mL

## 2024-03-30 MED ORDER — HEPARIN SOD (PORK) LOCK FLUSH 100 UNIT/ML IV SOLN
500.0000 [IU] | Freq: Once | INTRAVENOUS | Status: AC | PRN
  Administered 2024-03-30: 500 [IU]

## 2024-03-30 NOTE — Patient Instructions (Signed)

## 2024-03-30 NOTE — Telephone Encounter (Signed)
-----   Message from Gordy HERO Pyrtle sent at 03/29/2024  5:17 PM EDT ----- 7 AM is really an 8 AM start (I think).  If so, then I would like to start at 730 AM with EGD on [place] JMP ----- Message ----- From: Perley Rock SAUNDERS, RN Sent: 03/29/2024   4:24 PM EDT To: Gordy HERO Starch, MD  You have a 7am and a 7:30 already that am.  Are you double booking? ----- Message ----- From: Starch Gordy HERO, MD Sent: 03/29/2024   2:44 PM EDT To: Rock SAUNDERS Perley, RN  I will do 730 AM on 04/17/24 if that works for patient. Let me know Thanks JMP ----- Message ----- From: Perley Rock SAUNDERS, RN Sent: 03/29/2024  12:33 PM EDT To: Gordy HERO Starch, MD  Can I put him in at 3:30pm on 7/21? That is all you have and it isn't really a spot. ----- Message ----- From: Starch Gordy HERO, MD Sent: 03/29/2024  11:56 AM EDT To: Maude SAUNDERS Crease, MD; [facility] Triage  Jeralyn Thanks for the message and I am very glad to hear he is doing well with treatment. Just to clarify EGD is sufficient without EUS?  Assume so, but wanted to be sure. We can work to get him in for EGD in about 3-4 weeks. Happy 4th to you too Gordy [place] A: Please copy EGD request to chart and schedule [facility] EGD -- if I have no appointment, perhaps another [facility] physician can accommodate as long as okay with Mr. Belson.  Thanks all, JMP ----- Message ----- From: Crease Maude SAUNDERS, MD Sent: 03/29/2024   8:26 AM EDT To: Gordy HERO Starch, MD  Gordy: I need your expertise again.  Mr. Payes is doing so well.  He has had 4 cycles of chemotherapy.  I need to have another upper endoscopy to see what count results we have achieved.  If he can do this in 3 weeks that would be fantastic.  By then, his blood counts should be okay.  Thank you so much for all of your help.  I hope that you and your family have a wonderful July 4 holiday.  Jeralyn

## 2024-03-30 NOTE — Progress Notes (Signed)
 completed

## 2024-03-30 NOTE — Progress Notes (Signed)
 Nutrition follow up:  Reached out to patient's mobile.  He reports trying to eat healthier and being more attentive to trying to get more protein.  He did relay that some meats are difficult for him to chew with his dentures.  He has been reading up on the benefits of MCT oil but as he relayed no concerns with fat malabsorption in stools or with bowel regularity he didn't seem to require it.  Fluid intake a concern and he questioned use of alternative sweeteners and health issue with high fructose corn syrup.   Medications: reviewed   Labs: 03/15/24  total protein 6.3, WBC 15.7, Hgb 11.4  Anthropometrics: weight rebounded   Height: 67 Weight:  03/29/24  157# 03/17/24  157# 03/15/24  155# 03/01/24  150# UBW: 165-175# BMI: 24.59   Estimated Energy Needs  Kcals: 2000-2400 Protein: 82-102 g Fluid: 2.5 L   NUTRITION DIAGNOSIS: Food and Nutrition Related Knowledge Deficit related to cancer and associated treatments as evidenced by no prior need for nutrition related information.   INTERVENTION:   Encouraged healthy plant fats as more cost effective alternative than MCT. Discussed role of probiotics and pre biotics for gut health and reviewed sources for each. Relayed EEN and how to ballpark portion sizes for protein pacing. Suggested My Fitness Pal app if he wanted to track intake.  Reviewed strategies for constipation and emailed tip sheet.  MONITORING, EVALUATION, GOAL: weight trends, nutrition impact symptoms, PO intake, labs  Encouraged weight maintenance  Next Visit: PRN at patient or provider request.  Micheline Craven, RDN, LDN Registered Dietitian, [facility] Part Time Remote (Usual office hours: Tuesday-Thursday) Mobile: [phone]

## 2024-03-30 NOTE — Telephone Encounter (Signed)
 Patient scheduled for EGD in the [facility] arrival time 6:45am. Amb ref in epic. Prep instructions sent to pt.

## 2024-04-05 ENCOUNTER — Encounter: Admitting: Internal Medicine

## 2024-04-10 ENCOUNTER — Encounter: Payer: Self-pay | Admitting: Hematology & Oncology

## 2024-04-10 ENCOUNTER — Encounter: Payer: Self-pay | Admitting: Family

## 2024-04-17 ENCOUNTER — Encounter: Payer: Self-pay | Admitting: Internal Medicine

## 2024-04-17 ENCOUNTER — Ambulatory Visit: Admitting: Internal Medicine

## 2024-04-17 VITALS — BP 119/68 | HR 76 | Temp 97.3°F | Resp 12 | Ht 67.0 in | Wt 157.0 lb

## 2024-04-17 DIAGNOSIS — K209 Esophagitis, unspecified without bleeding: Secondary | ICD-10-CM

## 2024-04-17 DIAGNOSIS — C160 Malignant neoplasm of cardia: Secondary | ICD-10-CM

## 2024-04-17 DIAGNOSIS — K3189 Other diseases of stomach and duodenum: Secondary | ICD-10-CM | POA: Diagnosis not present

## 2024-04-17 MED ORDER — PANTOPRAZOLE SODIUM 40 MG PO TBEC: 40.0000 mg | DELAYED_RELEASE_TABLET | Freq: Two times a day (BID) | ORAL | 3 refills | Status: DC

## 2024-04-17 MED ORDER — SODIUM CHLORIDE 0.9 % IV SOLN: 500.0000 mL | Freq: Once | INTRAVENOUS | Status: DC

## 2024-04-17 NOTE — Patient Instructions (Signed)
  Resume previous diet  Continue present medications  Awaiting pathology results   YOU HAD AN ENDOSCOPIC PROCEDURE TODAY AT THE [facility]:   Refer to the procedure report that was given to you for any specific questions about what was found during the examination.  If the procedure report does not answer your questions, please call your gastroenterologist to clarify.  If you requested that your care partner not be given the details of your procedure findings, then the procedure report has been included in a sealed envelope for you to review at your convenience later.  YOU SHOULD EXPECT: Some feelings of bloating in the abdomen. Passage of more gas than usual.  Walking can help get rid of the air that was put into your GI tract during the procedure and reduce the bloating. If you had a lower endoscopy (such as a colonoscopy or flexible sigmoidoscopy) you may notice spotting of blood in your stool or on the toilet paper. If you underwent a bowel prep for your procedure, you may not have a normal bowel movement for a few days.  Please Note:  You might notice some irritation and congestion in your nose or some drainage.  This is from the oxygen used during your procedure.  There is no need for concern and it should clear up in a day or so.  SYMPTOMS TO REPORT IMMEDIATELY:  Following upper endoscopy (EGD)  Vomiting of blood or coffee ground material  New chest pain or pain under the shoulder blades  Painful or persistently difficult swallowing  New shortness of breath  Fever of 100F or higher  Black, tarry-looking stools  For urgent or emergent issues, a gastroenterologist can be reached at any hour by calling [phone]. Do not use MyChart messaging for urgent concerns.    DIET:  We do recommend a small meal at first, but then you may proceed to your regular diet.  Drink plenty of fluids but you should avoid alcoholic beverages for 24 hours.  ACTIVITY:  You should plan to  take it easy for the rest of today and you should NOT DRIVE or use heavy machinery until tomorrow (because of the sedation medicines used during the test).    FOLLOW UP: Our staff will call the number listed on your records the next business day following your procedure.  We will call around 7:15- 8:00 am to check on you and address any questions or concerns that you may have regarding the information given to you following your procedure. If we do not reach you, we will leave a message.     If any biopsies were taken you will be contacted by phone or by letter within the next 1-3 weeks.  Please call us at [phone] if you have not heard about the biopsies in 3 weeks.    SIGNATURES/CONFIDENTIALITY: You and/or your care partner have signed paperwork which will be entered into your electronic medical record.  These signatures attest to the fact that that the information above on your After Visit Summary has been reviewed and is understood.  Full responsibility of the confidentiality of this discharge information lies with you and/or your care-partner.

## 2024-04-17 NOTE — Op Note (Signed)
 [facility] Patient Name: Matthew Lee Procedure Date: 04/17/2024 7:52 AM MRN: [id number] Endoscopist: Gordy CHRISTELLA Starch , MD, [id number] Age: 68 Referring MD:  Date of Birth: Feb 11, 1956 Gender: Male Account #: [id number] Procedure:                Upper GI endoscopy Indications:              Follow-up of malignant GE junction adenocarcinoma                            found at EGD on 01/20/2024 and status post                            neoadjuvant chemotherapy under the direction of Dr.                            Timmy Medicines:                Monitored Anesthesia Care Procedure:                Pre-Anesthesia Assessment:                           - Prior to the procedure, a History and Physical                            was performed, and patient medications and                            allergies were reviewed. The patient's tolerance of                            previous anesthesia was also reviewed. The risks                            and benefits of the procedure and the sedation                            options and risks were discussed with the patient.                            All questions were answered, and informed consent                            was obtained. Prior Anticoagulants: The patient has                            taken no anticoagulant or antiplatelet agents. ASA                            Grade Assessment: III - A patient with severe                            systemic disease. After reviewing the risks and  benefits, the patient was deemed in satisfactory                            condition to undergo the procedure.                           After obtaining informed consent, the endoscope was                            passed under direct vision. Throughout the                            procedure, the patient's blood pressure, pulse, and                            oxygen saturations were monitored continuously. The                             Olympus Scope [id number] was introduced through the                            mouth, and advanced to the second part of duodenum.                            The upper GI endoscopy was accomplished without                            difficulty. The patient tolerated the procedure                            well. Scope In: Scope Out: Findings:                 Diffuse mild inflammation characterized by                            granularity was found in the entire esophagus.                           Localized nodular mucosa with minor eroded mucosa                            was found in the cardia, just distal to the GE                            junction (43 cm from the incisors). Biopsies were                            taken with a cold forceps for histology.                           The exam of the stomach was otherwise normal.                           The examined duodenum  was normal. Complications:            No immediate complications. Estimated Blood Loss:     Estimated blood loss was minimal. Impression:               - Esophageal mucosal changes were present,                            including granularity. Findings are suggestive of                            mild reflux inflammation.                           - Nodular with shallow eroded mucosa in the cardia.                            Localized and immediately distal to the GE                            junction. Biopsied. Significantly improved and the                            mass seen previous is no longer present.                           - Normal examined duodenum. Recommendation:           - Patient has a contact number available for                            emergencies. The signs and symptoms of potential                            delayed complications were discussed with the                            patient. Return to normal activities tomorrow.                            Written  discharge instructions were provided to the                            patient.                           - Resume previous diet.                           - Continue present medications.                           - Await pathology results.                           - Repeat upper endoscopy for surveillance per  oncology. Gordy CHRISTELLA Starch, MD 04/17/2024 8:21:40 AM This report has been signed electronically.

## 2024-04-17 NOTE — Progress Notes (Signed)
 Patient ID: Matthew Lee, male   DOB: 1955/12/30, 68 y.o.   MRN: [id number]    GASTROENTEROLOGY PROCEDURE H&P NOTE   Primary Care Physician: Daryl Setter, NP    Reason for Procedure:  Follow-up GE junction adenocarcinoma  Plan:    EGD  Patient is appropriate for endoscopic procedure(s) in the ambulatory ([facility]) setting.  The nature of the procedure, as well as the risks, benefits, and alternatives were carefully and thoroughly reviewed with the patient. Ample time for discussion and questions allowed. The patient understood, was satisfied, and agreed to proceed.     HPI: Matthew Lee is a 68 y.o. male who presents for EGD.  Medical history as below.  No recent chest pain or shortness of breath.  No abdominal pain today.  Past Medical History:  Diagnosis Date   Abnormal weight loss 11/02/2022   Aneurysm of thoracic aorta (HCC) 09/2022   dilated to 4 cm on CT   Cholelithiasis 11/02/2022   Colon cancer screening 07/31/2022   Coronary artery calcification 10/08/2022   Diverticulitis    Dyspnea on exertion 11/11/2022   Elevated PSA 07/31/2022   Essential hypertension 09/26/2009   Qualifier: Diagnosis of   By: Janit MD, Ozell Sevin 11/11/2022   Gall bladder polyp 02/18/2015   Overview:   - 6 mm (as described from US  abd in 2015). Repeat Us  abd lim ordered.   Gastric adenocarcinoma (HCC) 12/2023   GERD (gastroesophageal reflux disease) 09/07/2013   Hepatitis C 08/24/2012   TX and cured per pt   History of COVID-19 07/31/2022   History of hepatitis C 11/11/2022   History of tobacco abuse    Hyperglycemia 11/22/2014   Hypertension    long standing since 2007, was treated for sometime, but then discontinued taking meds on his own.   Immunization counseling 04/01/2022   Insomnia 10/31/2009   Qualifier: Diagnosis of   By: Arvella MD, Amanjot       Mild diastolic dysfunction 08/22/2012   Mixed dyslipidemia 11/11/2022   Preventative health care 12/31/2014    Prominent abdominal aortic pulsation 11/11/2022   Pulmonary edema, acute (HCC)    hospitalized in 09/2009 with hypertensive crisis and acute pulmonary edema- improved with lasix  nad labetalol .    Past Surgical History:  Procedure Laterality Date   CRANIOTOMY Right 01/25/2024   Procedure: CRANIOTOMY HEMATOMA EVACUATION SUBDURAL;  Surgeon: Debby Dorn MATSU, MD;  Location: [facility] OR;  Service: Neurosurgery;  Laterality: Right;   ESOPHAGOGASTRODUODENOSCOPY  08/21/2011   Procedure: ESOPHAGOGASTRODUODENOSCOPY (EGD);  Surgeon: Renaye Sous, MD;  Location: [facility];  Service: Endoscopy;  Laterality: N/A;   ESOPHAGOGASTRODUODENOSCOPY N/A 02/07/2024   Procedure: EGD (ESOPHAGOGASTRODUODENOSCOPY);  Surgeon: Wilhelmenia Aloha Raddle., MD;  Location: [facility];  Service: Gastroenterology;  Laterality: N/A;   EUS N/A 02/07/2024   Procedure: ULTRASOUND, UPPER GI TRACT, ENDOSCOPIC;  Surgeon: Wilhelmenia Aloha Raddle., MD;  Location: [facility];  Service: Gastroenterology;  Laterality: N/A;   IR IMAGING GUIDED PORT INSERTION  02/14/2024   IR US  LIVER BIOPSY  01/28/2024   TONSILLECTOMY      Prior to Admission medications   Medication Sig Start Date End Date Taking? Authorizing Provider  amLODipine  (NORVASC ) 10 MG tablet TAKE 1 TABLET DAILY 12/15/23  Yes O'Sullivan, Melissa, NP  atorvastatin  (LIPITOR) 10 MG tablet Take 1 tablet (10 mg total) by mouth daily. 12/23/23 04/17/24 Yes Revankar, Jennifer SAUNDERS, MD  labetalol  (NORMODYNE ) 100 MG tablet Take 1 tablet (100 mg total) by mouth 2 (two)  times daily. 03/17/24  Yes Daryl Setter, NP  levETIRAcetam  (KEPPRA ) 500 MG tablet Take 1 tablet (500 mg total) by mouth 2 (two) times daily. 01/29/24  Yes Akula, Vijaya, MD  losartan  (COZAAR ) 25 MG tablet Take 1 tablet (25 mg total) by mouth daily. 03/17/24  Yes Daryl Setter, NP  pantoprazole  (PROTONIX ) 40 MG tablet Take 1 tablet (40 mg total) by mouth 2 (two) times daily. 01/04/24  Yes McMichael, Bayley M, PA-C  dexamethasone   (DECADRON ) 4 MG tablet Take 2 tablets (8 mg total) by mouth daily. Start the day after chemotherapy for 2 days. Take with food. 02/14/24   Timmy Maude SAUNDERS, MD  ondansetron  (ZOFRAN ) 8 MG tablet Take 1 tablet (8 mg total) by mouth every 8 (eight) hours as needed for nausea or vomiting. Start on the third day after chemotherapy. Patient not taking: Reported on 04/17/2024 02/14/24   Timmy Maude SAUNDERS, MD  prochlorperazine  (COMPAZINE ) 10 MG tablet Take 1 tablet (10 mg total) by mouth every 6 (six) hours as needed for nausea or vomiting. Patient not taking: Reported on 04/17/2024 02/14/24   Timmy Maude SAUNDERS, MD  venlafaxine  (EFFEXOR ) 37.5 MG tablet Take 1 tablet (37.5 mg total) by mouth daily. Patient not taking: Reported on 04/17/2024 03/15/24   Timmy Maude SAUNDERS, MD    Current Outpatient Medications  Medication Sig Dispense Refill   amLODipine  (NORVASC ) 10 MG tablet TAKE 1 TABLET DAILY 90 tablet 3   atorvastatin  (LIPITOR) 10 MG tablet Take 1 tablet (10 mg total) by mouth daily. 90 tablet 3   labetalol  (NORMODYNE ) 100 MG tablet Take 1 tablet (100 mg total) by mouth 2 (two) times daily.     levETIRAcetam  (KEPPRA ) 500 MG tablet Take 1 tablet (500 mg total) by mouth 2 (two) times daily. 60 tablet 2   losartan  (COZAAR ) 25 MG tablet Take 1 tablet (25 mg total) by mouth daily. 90 tablet 1   pantoprazole  (PROTONIX ) 40 MG tablet Take 1 tablet (40 mg total) by mouth 2 (two) times daily. 60 tablet 2   dexamethasone  (DECADRON ) 4 MG tablet Take 2 tablets (8 mg total) by mouth daily. Start the day after chemotherapy for 2 days. Take with food. 30 tablet 1   ondansetron  (ZOFRAN ) 8 MG tablet Take 1 tablet (8 mg total) by mouth every 8 (eight) hours as needed for nausea or vomiting. Start on the third day after chemotherapy. (Patient not taking: Reported on 04/17/2024) 30 tablet 1   prochlorperazine  (COMPAZINE ) 10 MG tablet Take 1 tablet (10 mg total) by mouth every 6 (six) hours as needed for nausea or vomiting. (Patient not  taking: Reported on 04/17/2024) 30 tablet 1   venlafaxine  (EFFEXOR ) 37.5 MG tablet Take 1 tablet (37.5 mg total) by mouth daily. (Patient not taking: Reported on 04/17/2024) 14 tablet 3   Current Facility-Administered Medications  Medication Dose Route Frequency Provider Last Rate Last Admin   0.9 %  sodium chloride  infusion  500 mL Intravenous Once Chrishelle Zito, Gordy HERO, MD        Allergies as of 04/17/2024   (No Known Allergies)    Family History  Problem Relation Age of Onset   Polycystic kidney disease Father    Polycystic kidney disease Half-Brother    Polycystic kidney disease Half-Brother    Polycystic kidney disease Half-Sister    Colon cancer Neg Hx    Esophageal cancer Neg Hx    Stomach cancer Neg Hx    Pancreatic cancer Neg Hx     Social History  Socioeconomic History   Marital status: Married    Spouse name: Not on file   Number of children: Not on file   Years of education: Not on file   Highest education level: Some college, no degree  Occupational History   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   Occupation: Garment/textile technologist: [employer]  Tobacco Use   Smoking status: Former    Current packs/day: 0.00    Average packs/day: 1 pack/day for 40.0 years (40.0 ttl pk-yrs)    Types: Cigarettes    Start date: 09/23/1969    Quit date: 09/23/2009    Years since quitting: 14.5   Smokeless tobacco: Never  Vaping Use   Vaping status: Never Used  Substance and Sexual Activity   Alcohol use: Not Currently    Comment: Former drinker.   Drug use: Yes    Types: Marijuana    Comment: last used 04/16/2024 evening   Sexual activity: Not Currently  Other Topics Concern   Not on file  Social History Narrative   Lives at home with wife, daughter and her 2 children.   Caffeine use:  1 daily   Works a Aeronautical engineer (Museum/gallery conservator)   Enjoys Optician, dispensing, Civil engineer, contracting.         Social Drivers of Corporate investment banker  Strain: Low Risk  (03/14/2024)   Overall Financial Resource Strain (CARDIA)    Difficulty of Paying Living Expenses: Not hard at all  Food Insecurity: No Food Insecurity (03/14/2024)   Hunger Vital Sign    Worried About Running Out of Food in the Last Year: Never true    Ran Out of Food in the Last Year: Never true  Transportation Needs: No Transportation Needs (03/14/2024)   PRAPARE - Administrator, Civil Service (Medical): No    Lack of Transportation (Non-Medical): No  Physical Activity: Inactive (03/14/2024)   Exercise Vital Sign    Days of Exercise per Week: 0 days    Minutes of Exercise per Session: Not on file  Stress: No Stress Concern Present (03/14/2024)   [employer] of Occupational Health - Occupational Stress Questionnaire    Feeling of Stress: Not at all  Recent Concern: Stress - Stress Concern Present (12/15/2023)   [employer] of Occupational Health - Occupational Stress Questionnaire    Feeling of Stress : To some extent  Social Connections: Socially Isolated (03/14/2024)   Social Connection and Isolation Panel    Frequency of Communication with Friends and Family: Once a week    Frequency of Social Gatherings with Friends and Family: Once a week    Attends Religious Services: Never    Database administrator or Organizations: No    Attends Engineer, structural: Not on file    Marital Status: Married  Catering manager Violence: Not At Risk (01/25/2024)   Humiliation, Afraid, Rape, and Kick questionnaire    Fear of Current or Ex-Partner: No    Emotionally Abused: No    Physically Abused: No    Sexually Abused: No    Physical Exam: Vital signs in last 24 hours: @BP  (!) 144/77   Pulse 74   Temp (!) 97.3 F (36.3 C)   Resp 15   Ht 5' 7 (1.702 m)   Wt 157 lb (71.2 kg)   SpO2 96%   BMI 24.59 kg/m  GEN: NAD EYE: Sclerae anicteric ENT: MMM CV: Non-tachycardic Pulm: CTA b/l GI: Soft, NT/ND  NEURO:  Alert & Oriented x 3   Gordy Starch, MD [facility]  04/17/2024 7:57 AM

## 2024-04-17 NOTE — Progress Notes (Signed)
 Report to PACU, RN, vss, BBS= Clear.

## 2024-04-18 ENCOUNTER — Encounter: Payer: Self-pay | Admitting: Family

## 2024-04-18 ENCOUNTER — Telehealth: Payer: Self-pay | Admitting: *Deleted

## 2024-04-18 NOTE — Telephone Encounter (Signed)
  Follow up Call-     04/17/2024    7:21 AM 01/20/2024    3:05 PM  Call back number  Post procedure Call Back phone  # [phone]  Permission to leave phone message Yes Yes     Patient questions:  Do you have a fever, pain , or abdominal swelling? No. Pain Score  0 *  Have you tolerated food without any problems? Yes.    Have you been able to return to your normal activities? Yes.    Do you have any questions about your discharge instructions: Diet   No. Medications  No. Follow up visit  No.  Do you have questions or concerns about your Care? No.  Actions: * If pain score is 4 or above: No action needed, pain <4.

## 2024-04-20 ENCOUNTER — Ambulatory Visit (HOSPITAL_COMMUNITY)
Admission: RE | Admit: 2024-04-20 | Discharge: 2024-04-20 | Disposition: A | Discharge status: Home / Self Care | Source: Ambulatory Visit | Attending: Hematology & Oncology | Admitting: Hematology & Oncology

## 2024-04-20 DIAGNOSIS — C169 Malignant neoplasm of stomach, unspecified: Secondary | ICD-10-CM | POA: Insufficient documentation

## 2024-04-20 LAB — GLUCOSE, CAPILLARY: Glucose-Capillary: 105 mg/dL — ABNORMAL HIGH (ref 70–99)

## 2024-04-20 LAB — SURGICAL PATHOLOGY

## 2024-04-20 MED ORDER — FLUDEOXYGLUCOSE F - 18 (FDG) INJECTION
7.8100 | Freq: Once | INTRAVENOUS | Status: AC | PRN
  Administered 2024-04-20: 7.81 via INTRAVENOUS

## 2024-04-21 ENCOUNTER — Ambulatory Visit: Payer: Self-pay | Admitting: Hematology & Oncology

## 2024-04-21 ENCOUNTER — Ambulatory Visit: Payer: Self-pay | Admitting: Internal Medicine

## 2024-04-26 ENCOUNTER — Other Ambulatory Visit: Payer: Self-pay

## 2024-04-26 ENCOUNTER — Encounter: Payer: Self-pay | Admitting: Medical Oncology

## 2024-04-26 ENCOUNTER — Inpatient Hospital Stay

## 2024-04-26 ENCOUNTER — Inpatient Hospital Stay (HOSPITAL_BASED_OUTPATIENT_CLINIC_OR_DEPARTMENT_OTHER): Admitting: Medical Oncology

## 2024-04-26 ENCOUNTER — Encounter: Payer: Self-pay | Admitting: Hematology & Oncology

## 2024-04-26 VITALS — BP 155/88 | HR 74 | Temp 97.9°F | Resp 19 | Wt 159.7 lb

## 2024-04-26 DIAGNOSIS — Z7952 Long term (current) use of systemic steroids: Secondary | ICD-10-CM | POA: Diagnosis not present

## 2024-04-26 DIAGNOSIS — D509 Iron deficiency anemia, unspecified: Secondary | ICD-10-CM | POA: Diagnosis not present

## 2024-04-26 DIAGNOSIS — Z5111 Encounter for antineoplastic chemotherapy: Secondary | ICD-10-CM | POA: Diagnosis not present

## 2024-04-26 DIAGNOSIS — Z79899 Other long term (current) drug therapy: Secondary | ICD-10-CM | POA: Diagnosis not present

## 2024-04-26 DIAGNOSIS — Z5189 Encounter for other specified aftercare: Secondary | ICD-10-CM | POA: Diagnosis not present

## 2024-04-26 DIAGNOSIS — C160 Malignant neoplasm of cardia: Secondary | ICD-10-CM | POA: Diagnosis not present

## 2024-04-26 DIAGNOSIS — C169 Malignant neoplasm of stomach, unspecified: Secondary | ICD-10-CM

## 2024-04-26 LAB — CMP (CANCER CENTER ONLY)
ALT: 21 U/L (ref 0–44)
AST: 23 U/L (ref 15–41)
Albumin: 4.1 g/dL (ref 3.5–5.0)
Alkaline Phosphatase: 78 U/L (ref 38–126)
Anion gap: 11 (ref 5–15)
BUN: 19 mg/dL (ref 8–23)
CO2: 23 mmol/L (ref 22–32)
Calcium: 9.2 mg/dL (ref 8.9–10.3)
Chloride: 104 mmol/L (ref 98–111)
Creatinine: 0.83 mg/dL (ref 0.61–1.24)
GFR, Estimated: >60 mL/min (ref >60)
Glucose, Bld: 97 mg/dL (ref 70–99)
Potassium: 4 mmol/L (ref 3.5–5.1)
Sodium: 139 mmol/L (ref 135–145)
Total Bilirubin: 0.8 mg/dL (ref 0.0–1.2)
Total Protein: 6.7 g/dL (ref 6.5–8.1)

## 2024-04-26 LAB — CBC WITH DIFFERENTIAL (CANCER CENTER ONLY)
Abs Immature Granulocytes: 0.03 K/uL (ref 0.00–0.07)
Basophils Absolute: 0 K/uL (ref 0.0–0.1)
Basophils Relative: 1 %
Eosinophils Absolute: 0.1 K/uL (ref 0.0–0.5)
Eosinophils Relative: 1 %
HCT: 38.3 % — ABNORMAL LOW (ref 39.0–52.0)
Hemoglobin: 12.5 g/dL — ABNORMAL LOW (ref 13.0–17.0)
Immature Granulocytes: 0 %
Lymphocytes Relative: 18 %
Lymphs Abs: 1.3 K/uL (ref 0.7–4.0)
MCH: 26.5 pg (ref 26.0–34.0)
MCHC: 32.6 g/dL (ref 30.0–36.0)
MCV: 81.3 fL (ref 80.0–100.0)
Monocytes Absolute: 0.9 K/uL (ref 0.1–1.0)
Monocytes Relative: 12 %
Neutro Abs: 5.1 K/uL (ref 1.7–7.7)
Neutrophils Relative %: 68 %
Platelet Count: 239 K/uL (ref 150–400)
RBC: 4.71 MIL/uL (ref 4.22–5.81)
RDW: 22.2 % — ABNORMAL HIGH (ref 11.5–15.5)
WBC Count: 7.4 K/uL (ref 4.0–10.5)
nRBC: 0 % (ref 0.0–0.2)

## 2024-04-26 LAB — FERRITIN: Ferritin: 125 ng/mL (ref 24–336)

## 2024-04-26 LAB — LACTATE DEHYDROGENASE: LDH: 138 U/L (ref 98–192)

## 2024-04-26 LAB — IRON AND IRON BINDING CAPACITY (CC-WL,HP ONLY)
Iron: 101 ug/dL (ref 45–182)
Saturation Ratios: 23 % (ref 17.9–39.5)
TIBC: 433 ug/dL (ref 250–450)
UIBC: 332 ug/dL

## 2024-04-26 NOTE — Progress Notes (Signed)
 Hematology and Oncology Follow Up Visit  Matthew Lee [id number] [birth year] 68 y.o. 04/26/2024   Principle Diagnosis:  Locally advanced adenocarcinoma of the stomach Iron deficiency anemia-GI bleed  Current Therapy:   Neoadjuvant FLOT-s/p cycle #2 - start on 02/17/2024 Feraheme given on 02/17/2024     Interim History:  Matthew Lee is back for follow-up.    He is here with his wife today. He states that he is feeling well. He has no concerns today.   He had an Endoscopy on 21st. Samples showed no evidence of cancer but did show high grade dysplasia. He also had a PET scan on 04/20/2024 which showed reduction in his tumor and adenopathy.   He is still working.  He really enjoys his job.  He worked 10 hours yesterday.  He has had no problems with bowels or bladder.  He has had no melena or bright red blood per rectum.  He has had no odynophagia.  There is been no dysphagia.  He has had no cough or shortness of breath.  He has had no fever.  He has had no leg swelling.  There has been no rashes.  His weight is still going up which is nice to see..  Overall, his performance status is probably ECOG 0.   Medications:  Current Outpatient Medications:    amLODipine  (NORVASC ) 10 MG tablet, TAKE 1 TABLET DAILY, Disp: 90 tablet, Rfl: 3   atorvastatin  (LIPITOR) 10 MG tablet, Take 1 tablet (10 mg total) by mouth daily., Disp: 90 tablet, Rfl: 3   dexamethasone  (DECADRON ) 4 MG tablet, Take 2 tablets (8 mg total) by mouth daily. Start the day after chemotherapy for 2 days. Take with food., Disp: 30 tablet, Rfl: 1   labetalol  (NORMODYNE ) 100 MG tablet, Take 1 tablet (100 mg total) by mouth 2 (two) times daily., Disp: , Rfl:    levETIRAcetam  (KEPPRA ) 500 MG tablet, Take 1 tablet (500 mg total) by mouth 2 (two) times daily., Disp: 60 tablet, Rfl: 2   losartan  (COZAAR ) 25 MG tablet, Take 1 tablet (25 mg total) by mouth daily., Disp: 90 tablet, Rfl: 1   pantoprazole  (PROTONIX ) 40 MG tablet, Take 1  tablet (40 mg total) by mouth 2 (two) times daily., Disp: 180 tablet, Rfl: 3  Allergies: No Known Allergies  Past Medical History, Surgical history, Social history, and Family History were reviewed and updated.  Review of Systems: Review of Systems  Constitutional: Negative.   HENT:  Negative.    Eyes: Negative.   Respiratory: Negative.    Cardiovascular: Negative.   Gastrointestinal: Negative.   Endocrine: Negative.   Genitourinary: Negative.    Musculoskeletal: Negative.   Skin: Negative.   Neurological: Negative.   Hematological: Negative.   Psychiatric/Behavioral: Negative.      Physical Exam: Vital signs show temperature of 98.1.  Pulse 72.  Blood pressure 104/57.  Weight is 157 pounds  Wt Readings from Last 3 Encounters:  04/26/24 159 lb 11.2 oz (72.4 kg)  04/17/24 157 lb (71.2 kg)  03/29/24 157 lb (71.2 kg)    Physical Exam Vitals reviewed.  HENT:     Head: Normocephalic and atraumatic.  Eyes:     Pupils: Pupils are equal, round, and reactive to light.  Cardiovascular:     Rate and Rhythm: Normal rate and regular rhythm.     Heart sounds: Normal heart sounds.  Pulmonary:     Effort: Pulmonary effort is normal.     Breath sounds: Normal breath sounds.  Abdominal:     General: Bowel sounds are normal.     Palpations: Abdomen is soft.  Musculoskeletal:        General: No tenderness or deformity. Normal range of motion.     Cervical back: Normal range of motion.  Lymphadenopathy:     Cervical: No cervical adenopathy.  Skin:    General: Skin is warm and dry.     Findings: No erythema or Axon.  Neurological:     Mental Status: He is alert and oriented to person, place, and time.  Psychiatric:        Behavior: Behavior normal.        Thought Content: Thought content normal.        Judgment: Judgment normal.      Lab Results  Component Value Date   WBC 7.4 04/26/2024   HGB 12.5 (L) 04/26/2024   HCT 38.3 (L) 04/26/2024   MCV 81.3 04/26/2024   PLT  239 04/26/2024     Chemistry      Component Value Date/Time   NA 139 04/26/2024 0853   K 4.0 04/26/2024 0853   CL 104 04/26/2024 0853   CO2 23 04/26/2024 0853   BUN 19 04/26/2024 0853   CREATININE 0.83 04/26/2024 0853   CREATININE 0.85 01/21/2018 1500      Component Value Date/Time   CALCIUM  9.2 04/26/2024 0853   ALKPHOS 78 04/26/2024 0853   AST 23 04/26/2024 0853   ALT 21 04/26/2024 0853   BILITOT 0.8 04/26/2024 0853     Encounter Diagnoses  Name Primary?   Gastric adenocarcinoma (HCC) Yes   GE junction carcinoma (HCC)     Impression and Plan: Matthew Lee is a very nice 68 year old white male.  He has hopefully noted but localized adenocarcinoma of the stomach.  PET and Endoscopy are very reassuring. I have reviewed this report independently and with patient. Reviewed with Dr. Timmy that the plan is for him to have concurrent chemoradiation as the next steps.   XRT/Xeloda planned- will refer to Dr. Shannon. Referral placed.   RTC 1 month MD, port labs (CBC w/, CMP)   Lauraine CHRISTELLA Dais, PA-C 7/30/202510:05 AM

## 2024-04-27 ENCOUNTER — Telehealth: Payer: Self-pay | Admitting: Hematology & Oncology

## 2024-04-27 NOTE — Telephone Encounter (Signed)
 Called pt to schedule follow up appt. Informed pt LOS stated for pt to follow up in 1 month. Pt state We'll I was hoping to see him a lot sooner than a month out. But great to know that your provider cares so much about their patient's wellbeing. and disconnected call before being able to offer earlier appt.

## 2024-05-01 ENCOUNTER — Encounter: Payer: Self-pay | Admitting: Interventional Radiology

## 2024-05-01 ENCOUNTER — Inpatient Hospital Stay
Admission: RE | Admit: 2024-05-01 | Discharge: 2024-05-01 | Disposition: A | Discharge status: Home / Self Care | Payer: Self-pay | Source: Ambulatory Visit | Attending: Radiation Oncology | Admitting: Radiation Oncology

## 2024-05-01 ENCOUNTER — Other Ambulatory Visit: Payer: Self-pay | Admitting: Radiation Oncology

## 2024-05-01 DIAGNOSIS — C169 Malignant neoplasm of stomach, unspecified: Secondary | ICD-10-CM

## 2024-05-01 DIAGNOSIS — C160 Malignant neoplasm of cardia: Secondary | ICD-10-CM

## 2024-05-02 ENCOUNTER — Other Ambulatory Visit (HOSPITAL_COMMUNITY): Payer: Self-pay

## 2024-05-02 ENCOUNTER — Encounter: Payer: Self-pay | Admitting: Family

## 2024-05-02 ENCOUNTER — Other Ambulatory Visit: Payer: Self-pay

## 2024-05-02 ENCOUNTER — Other Ambulatory Visit: Payer: Self-pay | Admitting: Hematology & Oncology

## 2024-05-02 ENCOUNTER — Telehealth: Payer: Self-pay

## 2024-05-02 ENCOUNTER — Encounter: Payer: Self-pay | Admitting: *Deleted

## 2024-05-02 ENCOUNTER — Encounter: Payer: Self-pay | Admitting: Hematology & Oncology

## 2024-05-02 ENCOUNTER — Telehealth: Payer: Self-pay | Admitting: Pharmacist

## 2024-05-02 DIAGNOSIS — C169 Malignant neoplasm of stomach, unspecified: Secondary | ICD-10-CM

## 2024-05-02 MED ORDER — CAPECITABINE 500 MG PO TABS
2000.0000 mg | ORAL_TABLET | Freq: Every day | ORAL | 0 refills | Status: DC
  Filled 2024-05-02: qty 120, 30d supply, fill #0

## 2024-05-02 MED ORDER — CAPECITABINE 500 MG PO TABS: 2000.0000 mg | ORAL_TABLET | Freq: Every day | ORAL | 0 refills | Status: DC

## 2024-05-02 NOTE — Telephone Encounter (Signed)
 Oral Oncology Patient Advocate Encounter  After completing a benefits investigation, prior authorization for Capecitabine  is not required at this time through [employer].  Patient's copay is $0.00.      Charlott Hamilton,  CPhT-Adv  she/her/hers [facility]  [facility] Pharmacy Technician Patient Advocate Specialist III [facility] Phone: [phone]  Fax: [phone] [email or web address]

## 2024-05-02 NOTE — Progress Notes (Signed)
 Oral Chemotherapy Pharmacist Encounter  Patient was counseled under telephone encounter from 05/02/24.  Asberry Macintosh, PharmD, BCPS, BCOP Hematology/Oncology Clinical Pharmacist [facility] Oral Chemotherapy Navigation Clinics [phone] 05/02/2024 2:00 PM

## 2024-05-02 NOTE — Telephone Encounter (Signed)
 Oral Chemotherapy Pharmacist Encounter  I spoke with patient for overview of: Xeloda  (capecitabine ) for the treatment of gastric cancer in conjunction with radiation, planned for duration of radiation.  Counseled patient on administration, dosing, side effects, monitoring, drug-food interactions, safe handling, storage, and disposal.  Patient will take Xeloda  500mg  tablets, 4 tablets (2000mg ) by mouth in AM, within 30 minutes of finishing meals, on days of radiation only.  Xeloda  and radiation start date: pending - patient knows to start Xeloda  on first day of radiation.   Adverse effects of Xeloda  include but are not limited to: fatigue, decreased blood counts, GI upset, diarrhea, mouth sores, and hand-foot syndrome. Hand-foot syndrome: discussed use of cream such as [place] Smooth Extra Care 20 or equivalent advanced care cream that has 20% urea content for advanced skin hydration while on Xeloda . Additionally discussed use of OTC Voltaren gel for HFS prophylaxis. Discussed recommended use of Voltaren gel is 1 finger tip application for front/backside of hands and then 1 fingertip application to bottoms of feet twice a day for up to 12 weeks.  Diarrhea: Patient will obtain Imodium (loperamide) to have on hand if they experience diarrhea. Patient knows to alert the office of 4 or more loose stools above baseline.  Reviewed with patient importance of keeping a medication schedule and plan for any missed doses. No barriers to medication adherence identified.  Medication reconciliation performed and medication/allergy list updated. Patient will hold pantoprazole  while on Xeloda . Informed patient OK to take famotidine PRN while on Xeloda  if needed.   All questions answered.  [name] voiced understanding and appreciation.   Distress thermometer not completed during telephone call as patient has been on previous lines of therapy.   Medication education handout placed in mail for patient. Patient  knows to call the office with questions or concerns. Oral Chemotherapy Clinic phone number provided to patient.   Matthew Lee, PharmD, BCPS, BCOP Hematology/Oncology Clinical Pharmacist [phone] 05/02/2024 1:50 PM

## 2024-05-02 NOTE — Progress Notes (Signed)
 Patient's PET and endoscopy was reassuring. He will now proceed with radiation with Xeloda . His consult with RadOnc is on 05/08/2024. In review of chart, Xeloda  has not yet been sent. Message sent to provider.   Oncology Nurse Navigator Documentation     05/02/2024    8:30 AM  Oncology Nurse Navigator Flowsheets  Planned Course of Treatment Chemo/Radiation Concurrent  Navigator Follow Up Date: 05/08/2024  Navigator Follow Up Reason: Review Note  Navigator Location [facility]  Navigator Encounter Type Appt/Treatment Plan Review  Patient Visit Type MedOnc  Treatment Phase Active Tx  Barriers/Navigation Needs Coordination of Care;Education  Interventions None Required  Acuity Level 2-Minimal Needs (1-2 Barriers Identified)  Support Groups/Services Friends and Family  Time Spent with Patient 15

## 2024-05-02 NOTE — Telephone Encounter (Signed)
 Oral Oncology Pharmacist Encounter  Received new prescription for Xeloda  (capecitabine ) for the treatment of gastric cancer in conjunction with radiation, planned for duration of radiation.  CBC w/ Diff and CMP from 04/26/24 assessed, no relevant lab abnormalities requiring baseline dose adjustment required at this time. Dosing of Xeloda  is per MD discretion. Prescription dose and frequency assessed for appropriateness.  Current medication list in Epic reviewed, DDIs with Xeloda  identified: Category C DDI between Xeloda  and Pantoprazole  - proton-pump inhibitors can decrease efficacy of Xeloda  - will discuss with patient alternatives to pantoprazole , such as H2RA's like famotidine while on Xeloda .  Evaluated chart and no patient barriers to medication adherence noted.   Prescription has been e-scribed to the [facility] for benefits analysis and approval.  Oral Oncology Clinic will continue to follow for insurance authorization, copayment issues, initial counseling and start date.  Asberry Macintosh, PharmD, BCPS, BCOP Hematology/Oncology Clinical Pharmacist [phone] 05/02/2024 9:45 AM

## 2024-05-02 NOTE — Progress Notes (Signed)
 Specialty Pharmacy Initial Fill Coordination Note  Matthew Lee is a 68 y.o. male contacted today regarding refills of specialty medication(s) Capecitabine  (XELODA ) .  Patient requested [name] at [facility] [place]  on 05/08/24   Medication will be filled on 05/08/24.   Patient is aware of $0.00 copayment.

## 2024-05-04 ENCOUNTER — Other Ambulatory Visit: Payer: Self-pay

## 2024-05-04 NOTE — Progress Notes (Signed)
 GI Location of Tumor / Histology: Gastric adenocarcinoma   Matthew Lee presented around 4 months ago. He then underwent a upper endoscopy.  This was done on 01/20/2024.  The Gastroenterologist found that he had a circumferential lesion in the lower esophagus.  He found a large fungating and ulcerated three-quarter circumferential mass with some bleeding at the GE junction and cardia.  Biopsies were taken.  The pathology report [id number]) showed at least intramucosal adenocarcinoma  Biopsies of  (if applicable) revealed:   Past/Anticipated interventions by surgeon, if any:   Past/Anticipated interventions by medical oncology, if any:   Weight changes, if any:  05/08/24      161.0 lb 04/26/24    159 lb 11.2 oz 04/17/24     157 lb 03/29/24     157 lb 03/15/24      155 lb Bowel/Bladder complaints, if any: Denies  Nausea / Vomiting, if any: Denies  Pain issues, if any: Denies  Any blood per rectum:  Denies  SAFETY ISSUES: Prior radiation? No Pacemaker/ICD? No Possible current pregnancy? Male Is the patient on methotrexate? No  Current Complaints/Details:  BP 128/81 (BP Location: Left Arm, Patient Position: Sitting)   Pulse 74   Temp (!) 97.3 F (36.3 C) (Temporal)   Resp 18   Ht 5' 7 (1.702 m)   Wt 161 lb (73 kg)   SpO2 98%   BMI 25.22 kg/m

## 2024-05-06 NOTE — Progress Notes (Signed)
 Radiation Oncology         [phone]) [phone] ________________________________  Initial Outpatient Consultation  Name: Matthew Lee MRN: [id number]  Date: 05/08/2024  DOB: Sep 06, 1956  CC:O'Sullivan, Eleanor, NP  Timmy Maude SAUNDERS, MD   REFERRING PHYSICIAN: Timmy Maude SAUNDERS, MD  DIAGNOSIS: There were no encounter diagnoses.  Locally advanced gastric adenocarcinoma: s/p neoadjuvant chemotherapy   HISTORY OF PRESENT ILLNESS::Matthew Lee is a 68 y.o. male who is accompanied by ***. he is seen as a courtesy of Dr. Timmy for an opinion concerning radiation therapy as part of management for his recently diagnosed stomach cancer.   He was seen in consultation by Dr. Larwance at [facility] on 01/04/24 for evaluation of weakness, anemia, and an episode of melena that occurred in mid-March. During that visit, he was noted to report a similar bleeding episode in the late 1990s to early 2000s, characterized by profuse vomiting of blood. An endoscopy was performed at that time did not reveal significant findings.   He then underwent an colonoscopy and upper GI endoscopy on 01/20/24 under the care of Dr. Larwance. Procedural findings from his colonoscopy included a 5 mm polyp in the cecum which was resected, and severe diverticulosis in the sigmoid colon, descending colon, and in the transverse colon. Endoscopic findings further revealed a malignant appearing gastric tumor at the gastroesophageal junction and in the cardia. A biopsy of the gastroesophageal mass was obtained and showed findings consistent with at least intramucosal adenocarcinoma. The cecum was also biopsied and showed findings consistent with tubular adenoma without evidence malignancy.   A CT CAP with contrast was accordingly performed on 01/21/24 which demonstrated: nodular wall thickening over a long segment of the upper stomach extending from the GE junction to the midbody with somewhat irregular margins; adjacent prominent lymph nodes in  the gastrohepatic ligament but no specific abnormal appearing lymph nodes along the lower thorax; a slight increase in size of a prominent right axillary node of uncertain etiology and significance (previously demonstrated in 2024); and a stable 3 mm LLL pulmonary nodule.   Before he could receive further management, he presented to the ED on 04/29 with unilateral L-sided weakness and gait disturbance. A CT of the head was promptly performed in the ED which revealed a large, mostly isodense, and likely septated right subdural hematoma (measuring up to 23 mm in thickness) resulting in a leftward midline shift of 9 mm. No other acute findings were identified. Neurosurgery was accordingly consulted upon admission and he was cleared to proceed with a craniotomy for hematoma evacuation on 04/29 (a subdural membrane specimen was submitted for pathology and showed unremarkable findings). Post-op head CT on 04/30 showed a decrease in the amount of pre-existing subdural blood material on the right with less mass effect, and a right-to-left shift of 5.4 mm (compared with 8.8 mm on the prior study). A small amount of new hyperdense subdural blood related to the surgical evacuation was also demonstrated. He recovered well from his procedure and was discharged home on 05/03 with Keppra  for seizure precautions.   An MRI of the brain was also performed on 05/01 that showed: a residual right-sided subdural collection measuring up to 2.4 cm at the right frontal convexity, with persistent mass effect on the subjacent right cerebral hemisphere and 7 mm of right-to-left shift; and an additional smaller 4 mm left-sided subdural hematoma overlying the anterior left frontal convexity without significant mass effect. Imaging otherwise showed no evidence of intracranial metastatic disease.   IR  was also consulted during his hospitalization and he was able to undergo a right axillary lymph node biopsy on 05/02. Pathology showed no  evidence of malignancy and findings consistent with reactive lymphoid hyperplasia.   After recovering from his hospitalization, he was seen in consultation by Dr. Timmy on 05/07 to discuss systemic treatment options. To complete his staging work-up (and for treatment planning) he presented for a PET scan on 05/16: which demonstrated an area of significant abnormal radiotracer uptake corresponding to the large mass along the proximal stomach extending to the GE junction; 2 right axillary lymph nodes with abnormal increased uptake; and no significant abnormal uptake associated with the previously demonstrated nodes in the upper abdomen.   He then opted to proceed with a EGD/EUS on 02/07/24 under the care of Dr. Wilhelmenia. A gastric biopsy was obtained at that time which showed antral mucosa with mild reactive gastritis; negative for H. pylori, intestinal metaplasia, dysplasia, and malignancy.    Based on Dr. Jessy treatment recommendations, he began neoadjuvant chemotherapy with FLOT on 02/16/24. After his 4th cycle, he presented for a restaging PET scan on 04/20/24 which demonstrated: an interval decrease in hypermetabolism associated with the proximal gastric mass (with no measurable soft tissue lesion appreciable on non-contrast imaging) and an interval decrease in hypermetabolism associated with the previously demonstrated right axillary lymph nodes. Overall, PET imaging showed no new or progressive findings.   He recently presented for a repeat EGD on 07/21 that showed resolution of the previously demonstrated gastric tumor. Nodular and shallow eroded mucosa in the cardia were also demonstrated, localized to and immediately distal to the GE junction. A gastric cardia biopsy was collected at that time and showed no definitive evidence of invasion and a superficial fragment of gastric mucosa with high-grade dysplasia.   Dr. Timmy would now like him to proceed with adjuvant chemoradiation with  xeloda  which we will discuss in detail today.   Of note: He has been seen in consultation by Dr. Shyrl on 02/18/24 and he is not interested in pursuing any type of gastric surgery.   PREVIOUS RADIATION THERAPY: No  PAST MEDICAL HISTORY:  Past Medical History:  Diagnosis Date   Abnormal weight loss 11/02/2022   Aneurysm of thoracic aorta (HCC) 09/2022   dilated to 4 cm on CT   Cholelithiasis 11/02/2022   Colon cancer screening 07/31/2022   Coronary artery calcification 10/08/2022   Diverticulitis    Dyspnea on exertion 11/11/2022   Elevated PSA 07/31/2022   Essential hypertension 09/26/2009   Qualifier: Diagnosis of   By: Janit MD, Ozell Sevin 11/11/2022   Gall bladder polyp 02/18/2015   Overview:   - 6 mm (as described from US  abd in 2015). Repeat Us  abd lim ordered.   Gastric adenocarcinoma (HCC) 12/2023   GERD (gastroesophageal reflux disease) 09/07/2013   Hepatitis C 08/24/2012   TX and cured per pt   History of COVID-19 07/31/2022   History of hepatitis C 11/11/2022   History of tobacco abuse    Hyperglycemia 11/22/2014   Hypertension    long standing since 2007, was treated for sometime, but then discontinued taking meds on his own.   Immunization counseling 04/01/2022   Insomnia 10/31/2009   Qualifier: Diagnosis of   By: Arvella MD, Amanjot       Mild diastolic dysfunction 08/22/2012   Mixed dyslipidemia 11/11/2022   Preventative health care 12/31/2014   Prominent abdominal aortic pulsation 11/11/2022   Pulmonary edema, acute (  HCC)    hospitalized in 09/2009 with hypertensive crisis and acute pulmonary edema- improved with lasix  nad labetalol .    PAST SURGICAL HISTORY: Past Surgical History:  Procedure Laterality Date   CRANIOTOMY Right 01/25/2024   Procedure: CRANIOTOMY HEMATOMA EVACUATION SUBDURAL;  Surgeon: Debby Dorn MATSU, MD;  Location: [facility] OR;  Service: Neurosurgery;  Laterality: Right;   ESOPHAGOGASTRODUODENOSCOPY  08/21/2011   Procedure:  ESOPHAGOGASTRODUODENOSCOPY (EGD);  Surgeon: Renaye Sous, MD;  Location: [facility];  Service: Endoscopy;  Laterality: N/A;   ESOPHAGOGASTRODUODENOSCOPY N/A 02/07/2024   Procedure: EGD (ESOPHAGOGASTRODUODENOSCOPY);  Surgeon: Wilhelmenia Aloha Raddle., MD;  Location: [facility];  Service: Gastroenterology;  Laterality: N/A;   EUS N/A 02/07/2024   Procedure: ULTRASOUND, UPPER GI TRACT, ENDOSCOPIC;  Surgeon: Wilhelmenia Aloha Raddle., MD;  Location: [facility];  Service: Gastroenterology;  Laterality: N/A;   IR IMAGING GUIDED PORT INSERTION  02/14/2024   IR US  LIVER BIOPSY  01/28/2024   TONSILLECTOMY      FAMILY HISTORY:  Family History  Problem Relation Age of Onset   Polycystic kidney disease Father    Polycystic kidney disease Half-Brother    Polycystic kidney disease Half-Brother    Polycystic kidney disease Half-Sister    Colon cancer Neg Hx    Esophageal cancer Neg Hx    Stomach cancer Neg Hx    Pancreatic cancer Neg Hx     SOCIAL HISTORY:  Social History   Tobacco Use   Smoking status: Former    Current packs/day: 0.00    Average packs/day: 1 pack/day for 40.0 years (40.0 ttl pk-yrs)    Types: Cigarettes    Start date: 09/23/1969    Quit date: 09/23/2009    Years since quitting: 14.6   Smokeless tobacco: Never  Vaping Use   Vaping status: Never Used  Substance Use Topics   Alcohol use: Not Currently    Comment: Former drinker.   Drug use: Yes    Types: Marijuana    Comment: last used 04/16/2024 evening    ALLERGIES: No Known Allergies  MEDICATIONS:  Current Outpatient Medications  Medication Sig Dispense Refill   amLODipine  (NORVASC ) 10 MG tablet TAKE 1 TABLET DAILY 90 tablet 3   atorvastatin  (LIPITOR) 10 MG tablet Take 1 tablet (10 mg total) by mouth daily. 90 tablet 3   capecitabine  (XELODA ) 500 MG tablet Take 4 tablets (2,000 mg total) by mouth daily after breakfast. Take within 30 minutes after meal. Take only on days of radiation, Monday thru Friday. NONE on  the weekend 120 tablet 0   dexamethasone  (DECADRON ) 4 MG tablet Take 2 tablets (8 mg total) by mouth daily. Start the day after chemotherapy for 2 days. Take with food. 30 tablet 1   labetalol  (NORMODYNE ) 100 MG tablet Take 1 tablet (100 mg total) by mouth 2 (two) times daily.     levETIRAcetam  (KEPPRA ) 500 MG tablet Take 1 tablet (500 mg total) by mouth 2 (two) times daily. 60 tablet 2   losartan  (COZAAR ) 25 MG tablet Take 1 tablet (25 mg total) by mouth daily. 90 tablet 1   pantoprazole  (PROTONIX ) 40 MG tablet Take 1 tablet (40 mg total) by mouth 2 (two) times daily. 180 tablet 3   No current facility-administered medications for this encounter.    REVIEW OF SYSTEMS:  A 10+ POINT REVIEW OF SYSTEMS WAS OBTAINED including neurology, dermatology, psychiatry, cardiac, respiratory, lymph, extremities, GI, GU, musculoskeletal, constitutional, reproductive, HEENT. ***   PHYSICAL EXAM:  vitals were not taken for this visit.  General: Alert and oriented, in no acute distress HEENT: Head is normocephalic. Extraocular movements are intact. Oropharynx is clear. Neck: Neck is supple, no palpable cervical or supraclavicular lymphadenopathy. Heart: Regular in rate and rhythm with no murmurs, rubs, or gallops. Chest: Clear to auscultation bilaterally, with no rhonchi, wheezes, or rales. Abdomen: Soft, nontender, nondistended, with no rigidity or guarding. Extremities: No cyanosis or edema. Lymphatics: see Neck Exam Skin: No concerning lesions. Musculoskeletal: symmetric strength and muscle tone throughout. Neurologic: Cranial nerves II through XII are grossly intact. No obvious focalities. Speech is fluent. Coordination is intact. Psychiatric: Judgment and insight are intact. Affect is appropriate. ***  ECOG = ***  0 - Asymptomatic (Fully active, able to carry on all predisease activities without restriction)  1 - Symptomatic but completely ambulatory (Restricted in physically strenuous activity  but ambulatory and able to carry out work of a light or sedentary nature. For example, light housework, office work)  2 - Symptomatic, <50% in bed during the day (Ambulatory and capable of all self care but unable to carry out any work activities. Up and about more than 50% of waking hours)  3 - Symptomatic, >50% in bed, but not bedbound (Capable of only limited self-care, confined to bed or chair 50% or more of waking hours)  4 - Bedbound (Completely disabled. Cannot carry on any self-care. Totally confined to bed or chair)  5 - Death   Raylene MM, Creech RH, Tormey DC, et al. [phone]). Toxicity and response criteria of the [facility]. Am. DOROTHA Bridges. Oncol. 5 (6): 649-55  LABORATORY DATA:  Lab Results  Component Value Date   WBC 7.4 04/26/2024   HGB 12.5 (L) 04/26/2024   HCT 38.3 (L) 04/26/2024   MCV 81.3 04/26/2024   PLT 239 04/26/2024   NEUTROABS 5.1 04/26/2024   Lab Results  Component Value Date   NA 139 04/26/2024   K 4.0 04/26/2024   CL 104 04/26/2024   CO2 23 04/26/2024   GLUCOSE 97 04/26/2024   BUN 19 04/26/2024   CREATININE 0.83 04/26/2024   CALCIUM  9.2 04/26/2024      RADIOGRAPHY: NM PET Image Restag (PS) Skull Base To Thigh Result Date: 04/21/2024 CLINICAL DATA:  Subsequent treatment strategy for gastric cancer. EXAM: NUCLEAR MEDICINE PET SKULL BASE TO THIGH TECHNIQUE: 7.8 mCi F-18 FDG was injected intravenously. Full-ring PET imaging was performed from the skull base to thigh after the radiotracer. CT data was obtained and used for attenuation correction and anatomic localization. Fasting blood glucose: 105 mg/dl COMPARISON:  94/83/7974 FINDINGS: Mediastinal blood pool activity: SUV max 2.3 Liver activity: SUV max NA NECK: No hypermetabolic lymph nodes in the neck. Incidental CT findings: None. CHEST: Hypermetabolic lymph nodes in the right axillary region are again identified index more posterior node identified on the previous study with SUV max =  7.7 now demonstrates SUV max = 2.9. This lymph node is decreased in size measuring 11 mm short axis today compared to 818 mm previously no hypermetabolic pulmonary nodule or mass. No new hypermetabolic lymphadenopathy in the chest. Incidental CT findings: Right Port-A-Cath tip is positioned in the distal SVC. Coronary artery calcification is evident. Moderate atherosclerotic calcification is noted in the wall of the thoracic aorta. Centrilobular emphysema noted. ABDOMEN/PELVIS: Interval decrease in hypermetabolism associated with the proximal gastric mass. No measurable soft tissue lesion on noncontrast CT imaging. SUV max = 4.8 today compared to 19.5 previously. No hypermetabolic abdominal lymphadenopathy. No hypermetabolic liver lesion. Incidental CT findings: Stable thickening  of the adrenal glands. Exophytic water density lesion upper pole right kidney is stable, compatible with a cyst. No followup imaging is recommended. Colonic diverticulosis without diverticulitis. There is advanced atherosclerotic calcification of the abdominal aorta without aneurysm. SKELETON: Heterogeneous radiotracer accumulation diffusely in the skeleton is presumably secondary to stimulatory effects of therapy. Incidental CT findings: No worrisome lytic or sclerotic osseous abnormality. IMPRESSION: 1. Interval decrease in hypermetabolism associated with the proximal gastric mass. No measurable soft tissue lesion on noncontrast CT imaging. 2. Interval decrease in hypermetabolism associated with right axillary lymph nodes. 3. No suspicious new or progressive findings on today's study. 4. Aortic Atherosclerosis (ICD10-I70.0) and Emphysema (ICD10-J43.9). Electronically Signed   By: Camellia Candle M.D.   On: 04/21/2024 06:09      IMPRESSION: Locally advanced gastric adenocarcinoma: s/p neoadjuvant chemotherapy   ***  Today, I talked to the patient and family about the findings and work-up thus far.  We discussed the natural history of  *** and general treatment, highlighting the role of radiotherapy in the management.  We discussed the available radiation techniques, and focused on the details of logistics and delivery.  We reviewed the anticipated acute and late sequelae associated with radiation in this setting.  The patient was encouraged to ask questions that I answered to the best of my ability. *** A patient consent form was discussed and signed.  We retained a copy for our records.  The patient would like to proceed with radiation and will be scheduled for CT simulation.  PLAN: ***    *** minutes of total time was spent for this patient encounter, including preparation, face-to-face counseling with the patient and coordination of care, physical exam, and documentation of the encounter.   ------------------------------------------------  Lynwood CHARM Nasuti, PhD, MD  This document serves as a record of services personally performed by Lynwood Nasuti, MD. It was created on his behalf by Dorthy Fuse, a trained medical scribe. The creation of this record is based on the scribe's personal observations and the provider's statements to them. This document has been checked and approved by the attending provider.

## 2024-05-08 ENCOUNTER — Ambulatory Visit
Admission: RE | Admit: 2024-05-08 | Discharge: 2024-05-08 | Disposition: A | Discharge status: Home / Self Care | Source: Ambulatory Visit | Attending: Radiation Oncology | Admitting: Radiation Oncology

## 2024-05-08 ENCOUNTER — Encounter: Payer: Self-pay | Admitting: *Deleted

## 2024-05-08 ENCOUNTER — Encounter: Payer: Self-pay | Admitting: Radiation Oncology

## 2024-05-08 VITALS — BP 128/81 | HR 74 | Temp 97.3°F | Resp 18 | Ht 67.0 in | Wt 161.0 lb

## 2024-05-08 DIAGNOSIS — E782 Mixed hyperlipidemia: Secondary | ICD-10-CM | POA: Insufficient documentation

## 2024-05-08 DIAGNOSIS — Z8616 Personal history of COVID-19: Secondary | ICD-10-CM | POA: Diagnosis not present

## 2024-05-08 DIAGNOSIS — Z79899 Other long term (current) drug therapy: Secondary | ICD-10-CM | POA: Diagnosis not present

## 2024-05-08 DIAGNOSIS — Z9221 Personal history of antineoplastic chemotherapy: Secondary | ICD-10-CM | POA: Diagnosis not present

## 2024-05-08 DIAGNOSIS — Z7952 Long term (current) use of systemic steroids: Secondary | ICD-10-CM | POA: Diagnosis not present

## 2024-05-08 DIAGNOSIS — I10 Essential (primary) hypertension: Secondary | ICD-10-CM | POA: Insufficient documentation

## 2024-05-08 DIAGNOSIS — K5730 Diverticulosis of large intestine without perforation or abscess without bleeding: Secondary | ICD-10-CM | POA: Diagnosis not present

## 2024-05-08 DIAGNOSIS — S065XAA Traumatic subdural hemorrhage with loss of consciousness status unknown, initial encounter: Secondary | ICD-10-CM | POA: Insufficient documentation

## 2024-05-08 DIAGNOSIS — J432 Centrilobular emphysema: Secondary | ICD-10-CM | POA: Diagnosis not present

## 2024-05-08 DIAGNOSIS — K219 Gastro-esophageal reflux disease without esophagitis: Secondary | ICD-10-CM | POA: Insufficient documentation

## 2024-05-08 DIAGNOSIS — R140 Abdominal distension (gaseous): Secondary | ICD-10-CM | POA: Diagnosis not present

## 2024-05-08 DIAGNOSIS — I712 Thoracic aortic aneurysm, without rupture, unspecified: Secondary | ICD-10-CM | POA: Insufficient documentation

## 2024-05-08 DIAGNOSIS — Z87891 Personal history of nicotine dependence: Secondary | ICD-10-CM | POA: Diagnosis not present

## 2024-05-08 DIAGNOSIS — I700 Atherosclerosis of aorta: Secondary | ICD-10-CM | POA: Diagnosis not present

## 2024-05-08 DIAGNOSIS — C169 Malignant neoplasm of stomach, unspecified: Secondary | ICD-10-CM

## 2024-05-08 NOTE — Progress Notes (Signed)
 Patient seen by RadOnc today. He will have simulation this Thursday, 05/11/24. He already has his Xeloda .   No current follow up appointments with this office. Message sent to scheduling.   Oncology Nurse Navigator Documentation     05/08/2024    9:00 AM  Oncology Nurse Navigator Flowsheets  Planned Course of Treatment Chemo/Radiation Concurrent  Navigator Follow Up Date: 05/26/2024  Navigator Follow Up Reason: Follow-up Appointment  Navigator Location [facility]  Navigator Encounter Type Appt/Treatment Plan Review  Patient Visit Type MedOnc  Treatment Phase Active Tx  Barriers/Navigation Needs Coordination of Care;Education  Interventions Coordination of Care  Acuity Level 2-Minimal Needs (1-2 Barriers Identified)  Coordination of Care Appts  Support Groups/Services Friends and Family  Time Spent with Patient 30

## 2024-05-09 ENCOUNTER — Ambulatory Visit

## 2024-05-09 ENCOUNTER — Other Ambulatory Visit: Payer: Self-pay

## 2024-05-09 VITALS — Ht 67.0 in | Wt 159.0 lb

## 2024-05-09 DIAGNOSIS — Z0000 Encounter for general adult medical examination without abnormal findings: Secondary | ICD-10-CM

## 2024-05-09 NOTE — Patient Instructions (Addendum)
 Matthew Lee , Thank you for taking time out of your busy schedule to complete your Annual Wellness Visit with me. I enjoyed our conversation and look forward to speaking with you again next year. I, as well as your care team,  appreciate your ongoing commitment to your health goals. Please review the following plan we discussed and let me know if I can assist you in the future. Your Game plan/ To Do List    Referrals: If you haven't heard from the office you've been referred to, please reach out to them at the phone provided.   Follow up Visits: We will see or speak with you next year for your Next Medicare AWV with our clinical staff 05/15/25 @ 8:20a Have you seen your provider in the last 6 months (3 months if uncontrolled diabetes)?   Clinician Recommendations:  Aim for 30 minutes of exercise or brisk walking, 6-8 glasses of water, and 5 servings of fruits and vegetables each day.       This is a list of the screenings recommended for you:  Health Maintenance  Topic Date Due   COVID-19 Vaccine (3 - Moderna risk series) 01/24/2020   DTaP/Tdap/Td vaccine (2 - Td or Tdap) 03/16/2023   Flu Shot  04/28/2024   Screening for Lung Cancer  01/20/2025   Medicare Annual Wellness Visit  05/09/2025   Colon Cancer Screening  01/20/2031   Pneumococcal Vaccine for age over 23  Completed   Hepatitis B Vaccine  Completed   Hepatitis C Screening  Completed   Zoster (Shingles) Vaccine  Completed   HPV Vaccine  Aged Out   Meningitis B Vaccine  Aged Out    Advanced directives: (Declined) Advance directive discussed with you today. Even though you declined this today, please call our office should you change your mind, and we can give you the proper paperwork for you to fill out. Advance Care Planning is important because it:  [x]  Makes sure you receive the medical care that is consistent with your values, goals, and preferences  [x]  It provides guidance to your family and loved ones and reduces their  decisional burden about whether or not they are making the right decisions based on your wishes.  Follow the link provided in your after visit summary or read over the paperwork we have mailed to you to help you started getting your Advance Directives in place. If you need assistance in completing these, please reach out to us  so that we can help you!  See attachments for Preventive Care and Fall Prevention Tips.

## 2024-05-09 NOTE — Progress Notes (Signed)
 Subjective:   Matthew Lee is a 68 y.o. who presents for a Medicare Wellness preventive visit.  As a reminder, Annual Wellness Visits don't include a physical exam, and some assessments may be limited, especially if this visit is performed virtually. We may recommend an in-person follow-up visit with your provider if needed.  Visit Complete: Virtual I connected with  [name] on 05/09/24 by a audio enabled telemedicine application and verified that I am speaking with the correct person using two identifiers.  Patient Location: Home  Provider Location: Home Office  I discussed the limitations of evaluation and management by telemedicine. The patient expressed understanding and agreed to proceed.  Vital Signs: Because this visit was a virtual/telehealth visit, some criteria may be missing or patient reported. Any vitals not documented were not able to be obtained and vitals that have been documented are patient reported.    Persons Participating in Visit: Patient.  AWV Questionnaire: Yes: Patient Medicare AWV questionnaire was completed by the patient on 05/09/24; I have confirmed that all information answered by patient is correct and no changes since this date.  Cardiac Risk Factors include: male gender;advanced age (>36men, >74 women);hypertension     Objective:    Today's Vitals   05/05/24 0427 05/09/24 1034  Weight:  159 lb (72.1 kg)  Height:  5' 7 (1.702 m)  PainSc: 0-No pain    Body mass index is 24.9 kg/m.     05/09/2024   10:41 AM 05/08/2024    7:50 AM 04/26/2024    8:55 AM 03/29/2024    8:07 AM 03/15/2024    8:15 AM 03/01/2024    8:43 AM 02/14/2024   10:32 AM  Advanced Directives  Does Patient Have a Medical Advance Directive? No No No No No No No  Would patient like information on creating a medical advance directive? No - Patient declined  No - Patient declined No - Patient declined No - Patient declined No - Patient declined No - Patient declined    Current  Medications (verified) Outpatient Encounter Medications as of 05/09/2024  Medication Sig   amLODipine  (NORVASC ) 10 MG tablet TAKE 1 TABLET DAILY   atorvastatin  (LIPITOR) 10 MG tablet Take 1 tablet (10 mg total) by mouth daily.   capecitabine  (XELODA ) 500 MG tablet Take 4 tablets (2,000 mg total) by mouth daily after breakfast. Take within 30 minutes after meal. Take only on days of radiation, Monday thru Friday. NONE on the weekend   dexamethasone  (DECADRON ) 4 MG tablet Take 2 tablets (8 mg total) by mouth daily. Start the day after chemotherapy for 2 days. Take with food. (Patient not taking: Reported on 05/08/2024)   labetalol  (NORMODYNE ) 100 MG tablet Take 1 tablet (100 mg total) by mouth 2 (two) times daily.   levETIRAcetam  (KEPPRA ) 500 MG tablet Take 1 tablet (500 mg total) by mouth 2 (two) times daily.   losartan  (COZAAR ) 25 MG tablet Take 1 tablet (25 mg total) by mouth daily.   pantoprazole  (PROTONIX ) 40 MG tablet Take 1 tablet (40 mg total) by mouth 2 (two) times daily. (Patient not taking: Reported on 05/08/2024)   No facility-administered encounter medications on file as of 05/09/2024.    Allergies (verified) Patient has no known allergies.   History: Past Medical History:  Diagnosis Date   Abnormal weight loss 11/02/2022   Aneurysm of thoracic aorta (HCC) 09/2022   dilated to 4 cm on CT   Cholelithiasis 11/02/2022   Colon cancer screening 07/31/2022  Coronary artery calcification 10/08/2022   Diverticulitis    Dyspnea on exertion 11/11/2022   Elevated PSA 07/31/2022   Essential hypertension 09/26/2009   Qualifier: Diagnosis of   By: Janit MD, Ozell Sevin 11/11/2022   Gall bladder polyp 02/18/2015   Overview:   - 6 mm (as described from US  abd in 2015). Repeat Us  abd lim ordered.   Gastric adenocarcinoma (HCC) 12/2023   GERD (gastroesophageal reflux disease) 09/07/2013   Hepatitis C 08/24/2012   TX and cured per pt   History of COVID-19 07/31/2022   History  of hepatitis C 11/11/2022   History of tobacco abuse    Hyperglycemia 11/22/2014   Hypertension    long standing since 2007, was treated for sometime, but then discontinued taking meds on his own.   Immunization counseling 04/01/2022   Insomnia 10/31/2009   Qualifier: Diagnosis of   By: Arvella MD, Amanjot       Mild diastolic dysfunction 08/22/2012   Mixed dyslipidemia 11/11/2022   Preventative health care 12/31/2014   Prominent abdominal aortic pulsation 11/11/2022   Pulmonary edema, acute (HCC)    hospitalized in 09/2009 with hypertensive crisis and acute pulmonary edema- improved with lasix  nad labetalol .   Past Surgical History:  Procedure Laterality Date   CRANIOTOMY Right 01/25/2024   Procedure: CRANIOTOMY HEMATOMA EVACUATION SUBDURAL;  Surgeon: Debby Dorn MATSU, MD;  Location: [facility] OR;  Service: Neurosurgery;  Laterality: Right;   ESOPHAGOGASTRODUODENOSCOPY  08/21/2011   Procedure: ESOPHAGOGASTRODUODENOSCOPY (EGD);  Surgeon: Renaye Sous, MD;  Location: [facility];  Service: Endoscopy;  Laterality: N/A;   ESOPHAGOGASTRODUODENOSCOPY N/A 02/07/2024   Procedure: EGD (ESOPHAGOGASTRODUODENOSCOPY);  Surgeon: Wilhelmenia Aloha Raddle., MD;  Location: [facility];  Service: Gastroenterology;  Laterality: N/A;   EUS N/A 02/07/2024   Procedure: ULTRASOUND, UPPER GI TRACT, ENDOSCOPIC;  Surgeon: Wilhelmenia Aloha Raddle., MD;  Location: [facility];  Service: Gastroenterology;  Laterality: N/A;   IR IMAGING GUIDED PORT INSERTION  02/14/2024   IR US  LIVER BIOPSY  01/28/2024   TONSILLECTOMY     Family History  Problem Relation Age of Onset   Polycystic kidney disease Father    Cancer Maternal Aunt    Polycystic kidney disease Half-Brother    Polycystic kidney disease Half-Brother    Polycystic kidney disease Half-Sister    Colon cancer Neg Hx    Esophageal cancer Neg Hx    Stomach cancer Neg Hx    Pancreatic cancer Neg Hx    Social History   Socioeconomic History   Marital status:  Married    Spouse name: Not on file   Number of children: Not on file   Years of education: Not on file   Highest education level: Some college, no degree  Occupational History   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   Occupation: Garment/textile technologist: [employer]  Tobacco Use   Smoking status: Former    Current packs/day: 0.00    Average packs/day: 1 pack/day for 40.0 years (40.0 ttl pk-yrs)    Types: Cigarettes    Start date: 09/23/1969    Quit date: 09/23/2009    Years since quitting: 14.6   Smokeless tobacco: Never  Vaping Use   Vaping status: Never Used  Substance and Sexual Activity   Alcohol use: Not Currently    Comment: Former drinker.   Drug use: Yes    Types: Marijuana    Comment: last used 04/16/2024 evening   Sexual activity: Not  Currently  Other Topics Concern   Not on file  Social History Narrative   Lives at home with wife, daughter and her 2 children.   Caffeine use:  1 daily   Works a Aeronautical engineer (Museum/gallery conservator)   Enjoys Optician, dispensing, Civil engineer, contracting.         Social Drivers of Corporate investment banker Strain: Low Risk  (05/09/2024)   Overall Financial Resource Strain (CARDIA)    Difficulty of Paying Living Expenses: Not hard at all  Food Insecurity: No Food Insecurity (05/09/2024)   Hunger Vital Sign    Worried About Running Out of Food in the Last Year: Never true    Ran Out of Food in the Last Year: Never true  Transportation Needs: No Transportation Needs (05/09/2024)   PRAPARE - Administrator, Civil Service (Medical): No    Lack of Transportation (Non-Medical): No  Physical Activity: Inactive (05/09/2024)   Exercise Vital Sign    Days of Exercise per Week: 0 days    Minutes of Exercise per Session: 0 min  Stress: No Stress Concern Present (05/09/2024)   [employer] of Occupational Health - Occupational Stress Questionnaire    Feeling of Stress: Not at all  Social Connections:  Moderately Isolated (05/09/2024)   Social Connection and Isolation Panel    Frequency of Communication with Friends and Family: More than three times a week    Frequency of Social Gatherings with Friends and Family: More than three times a week    Attends Religious Services: Never    Database administrator or Organizations: No    Attends Engineer, structural: Never    Marital Status: Married    Tobacco Counseling Counseling given: Not Answered    Clinical Intake:  Pre-visit preparation completed: Yes  Pain : No/denies pain Pain Score: 0-No pain     BMI - recorded: 24.9 Nutritional Status: BMI of 19-24  Normal Nutritional Risks: None Diabetes: No  Lab Results  Component Value Date   HGBA1C 5.8 07/31/2022   HGBA1C 5.8 04/01/2022   HGBA1C 5.7 04/09/2021     How often do you need to have someone help you when you read instructions, pamphlets, or other written materials from your doctor or pharmacy?: 1 - Never  Interpreter Needed?: No  Information entered by :: Rojelio Blush LPN   Activities of Daily Living     05/09/2024   10:43 AM 05/05/2024    4:27 AM  In your present state of health, do you have any difficulty performing the following activities:  Hearing? 0 0  Vision? 0 0  Difficulty concentrating or making decisions? 0 0  Walking or climbing stairs? 0 0  Dressing or bathing? 0 0  Doing errands, shopping? 0 0  Preparing Food and eating ? N N  Using the Toilet? N N  In the past six months, have you accidently leaked urine? N N  Do you have problems with loss of bowel control? N N  Managing your Medications? N N  Managing your Finances? N N  Housekeeping or managing your Housekeeping? N N    Patient Care Team: Daryl Setter, NP as PCP - General (Internal Medicine) Timmy Maude SAUNDERS, MD as Medical Oncologist (Oncology) Randine Warren BRAVO, RN as Oncology Nurse Navigator  I have updated your Care Teams any recent Medical Services you may have  received from other providers in the past year.     Assessment:   This is a  routine wellness examination for [facility].  Hearing/Vision screen Hearing Screening - Comments:: Denies hearing difficulties   Vision Screening - Comments:: Wears rx glasses - up to date with routine eye exams with  [facility]   Goals Addressed               This Visit's Progress     Increase physical activity (pt-stated)        Get more active. Develop a bluetooth device.       Depression Screen     05/09/2024   10:37 AM 05/08/2024    7:58 AM 03/29/2024    8:00 AM 12/15/2023   11:45 AM 06/02/2022    2:02 PM 09/30/2021    7:09 AM 08/26/2021    3:40 PM  PHQ 2/9 Scores  PHQ - 2 Score 0 0 0 0 0 0 0  PHQ- 9 Score    0       Fall Risk     05/09/2024   10:39 AM 05/05/2024    4:27 AM 12/15/2023   11:45 AM 06/02/2022    2:02 PM 09/30/2021    7:09 AM  Fall Risk   Falls in the past year? 1 1 0 0 0  Number falls in past yr: 0 0 0 0 0  Injury with Fall? 1 1 0 0 0  Comment Head injury, Followed by medical attention      Risk for fall due to : Mental status change  No Fall Risks No Fall Risks No Fall Risks  Follow up Falls evaluation completed  Falls evaluation completed Falls evaluation completed  Falls evaluation completed      Data saved with a previous flowsheet row definition    MEDICARE RISK AT HOME:  Medicare Risk at Home Any stairs in or around the home?: Yes If so, are there any without handrails?: No Home free of loose throw rugs in walkways, pet beds, electrical cords, etc?: Yes Adequate lighting in your home to reduce risk of falls?: Yes Life alert?: No Use of a cane, walker or w/c?: No Grab bars in the bathroom?: Yes Shower chair or bench in shower?: No Elevated toilet seat or a handicapped toilet?: No  TIMED UP AND GO:  Was the test performed?  No  Cognitive Function: 6CIT completed        05/09/2024   10:41 AM 06/02/2022    2:08 PM  6CIT Screen  What Year? 0 points 0 points   What month? 0 points 0 points  What time? 0 points 0 points  Count back from 20 0 points 0 points  Months in reverse 0 points 0 points  Repeat phrase 0 points 2 points  Total Score 0 points 2 points    Immunizations Immunization History  Administered Date(s) Administered   Fluad Quad(high Dose 65+) 06/28/2020, 09/30/2021, 07/31/2022   Hep A / Hep B 06/05/2013, 07/05/2013, 12/04/2013   Influenza Inj Mdck Quad Pf 07/06/2018   Influenza Whole 09/16/2009   Influenza,inj,Quad PF,6+ Mos 06/05/2013, 07/12/2014, 07/27/2015   Influenza-Unspecified 06/28/2012, 07/12/2014   Moderna Sars-Covid-2 Vaccination 11/10/2019, 12/27/2019   PNEUMOCOCCAL CONJUGATE-20 04/09/2021   Pneumococcal Polysaccharide-23 12/31/2014, 01/29/2020   Pneumococcal-Unspecified 08/28/2009   Tdap 03/15/2013   Zoster Recombinant(Shingrix ) 03/15/2020, 05/15/2020    Screening Tests Health Maintenance  Topic Date Due   COVID-19 Vaccine (3 - Moderna risk series) 01/24/2020   DTaP/Tdap/Td (2 - Td or Tdap) 03/16/2023   INFLUENZA VACCINE  04/28/2024   Lung Cancer Screening  01/20/2025  Medicare Annual Wellness (AWV)  05/09/2025   Colonoscopy  01/20/2031   Pneumococcal Vaccine: 50+ Years  Completed   Hepatitis B Vaccines  Completed   Hepatitis C Screening  Completed   Zoster Vaccines- Shingrix   Completed   HPV VACCINES  Aged Out   Meningococcal B Vaccine  Aged Out    Health Maintenance  Health Maintenance Due  Topic Date Due   COVID-19 Vaccine (3 - Moderna risk series) 01/24/2020   DTaP/Tdap/Td (2 - Td or Tdap) 03/16/2023   INFLUENZA VACCINE  04/28/2024   Health Maintenance Items Addressed:   Additional Screening:  Vision Screening: Recommended annual ophthalmology exams for early detection of glaucoma and other disorders of the eye. Would you like a referral to an eye doctor? No    Dental Screening: Recommended annual dental exams for proper oral hygiene  Community Resource Referral / Chronic Care  Management: CRR required this visit?  No   CCM required this visit?  No   Plan:    I have personally reviewed and noted the following in the patient's chart:   Medical and social history Use of alcohol, tobacco or illicit drugs  Current medications and supplements including opioid prescriptions. Patient is not currently taking opioid prescriptions. Functional ability and status Nutritional status Physical activity Advanced directives List of other physicians Hospitalizations, surgeries, and ER visits in previous 12 months Vitals Screenings to include cognitive, depression, and falls Referrals and appointments  In addition, I have reviewed and discussed with patient certain preventive protocols, quality metrics, and best practice recommendations. A written personalized care plan for preventive services as well as general preventive health recommendations were provided to patient.   Rojelio LELON Blush, LPN   1/87/7974   After Visit Summary: (MyChart) Due to this being a telephonic visit, the after visit summary with patients personalized plan was offered to patient via MyChart   Notes: Nothing significant to report at this time.

## 2024-05-10 ENCOUNTER — Other Ambulatory Visit: Payer: Self-pay

## 2024-05-11 ENCOUNTER — Ambulatory Visit
Admission: RE | Admit: 2024-05-11 | Discharge: 2024-05-11 | Disposition: A | Discharge status: Home / Self Care | Source: Ambulatory Visit | Attending: Radiation Oncology | Admitting: Radiation Oncology

## 2024-05-11 ENCOUNTER — Other Ambulatory Visit: Payer: Self-pay

## 2024-05-11 DIAGNOSIS — Z510 Encounter for antineoplastic radiation therapy: Secondary | ICD-10-CM | POA: Insufficient documentation

## 2024-05-11 DIAGNOSIS — C160 Malignant neoplasm of cardia: Secondary | ICD-10-CM | POA: Insufficient documentation

## 2024-05-11 DIAGNOSIS — C169 Malignant neoplasm of stomach, unspecified: Secondary | ICD-10-CM | POA: Diagnosis not present

## 2024-05-12 ENCOUNTER — Other Ambulatory Visit (HOSPITAL_COMMUNITY): Payer: Self-pay

## 2024-05-15 ENCOUNTER — Encounter: Payer: Self-pay | Admitting: Hematology & Oncology

## 2024-05-16 ENCOUNTER — Encounter: Payer: Self-pay | Admitting: *Deleted

## 2024-05-16 DIAGNOSIS — Z510 Encounter for antineoplastic radiation therapy: Secondary | ICD-10-CM | POA: Diagnosis not present

## 2024-05-16 DIAGNOSIS — C160 Malignant neoplasm of cardia: Secondary | ICD-10-CM | POA: Diagnosis not present

## 2024-05-16 DIAGNOSIS — C169 Malignant neoplasm of stomach, unspecified: Secondary | ICD-10-CM | POA: Diagnosis not present

## 2024-05-16 NOTE — Progress Notes (Signed)
 Patient will start concurrent ChemoRT on 05/22/2024. He had initially requested lab check this week with radiation, but now treatment won't start until 8/25. Sent MyChart message asking him if he still wished to have labs checked this week.  (Attempted to call but no answer and voicemail full)  Oncology Nurse Navigator Documentation     05/16/2024   12:30 PM  Oncology Nurse Navigator Flowsheets  Navigator Follow Up Date: 05/26/2024  Navigator Follow Up Reason: Follow-up Appointment;Chemotherapy  Navigator Location [facility]  Navigator Encounter Type MyChart;Appt/Treatment Plan Review  Telephone Outgoing Call  Patient Visit Type MedOnc  Treatment Phase Active Tx  Barriers/Navigation Needs Coordination of Care;Education  Interventions Coordination of Care  Acuity Level 2-Minimal Needs (1-2 Barriers Identified)  Support Groups/Services Friends and Family  Time Spent with Patient 15

## 2024-05-18 ENCOUNTER — Inpatient Hospital Stay: Attending: Hematology & Oncology

## 2024-05-18 ENCOUNTER — Inpatient Hospital Stay

## 2024-05-18 ENCOUNTER — Encounter: Payer: Self-pay | Admitting: Family

## 2024-05-18 ENCOUNTER — Encounter: Payer: Self-pay | Admitting: Hematology & Oncology

## 2024-05-18 DIAGNOSIS — Z923 Personal history of irradiation: Secondary | ICD-10-CM | POA: Insufficient documentation

## 2024-05-18 DIAGNOSIS — C169 Malignant neoplasm of stomach, unspecified: Secondary | ICD-10-CM | POA: Insufficient documentation

## 2024-05-18 DIAGNOSIS — I10 Essential (primary) hypertension: Secondary | ICD-10-CM

## 2024-05-18 DIAGNOSIS — D500 Iron deficiency anemia secondary to blood loss (chronic): Secondary | ICD-10-CM | POA: Diagnosis not present

## 2024-05-18 DIAGNOSIS — C160 Malignant neoplasm of cardia: Secondary | ICD-10-CM | POA: Diagnosis not present

## 2024-05-18 DIAGNOSIS — Z79899 Other long term (current) drug therapy: Secondary | ICD-10-CM | POA: Diagnosis not present

## 2024-05-18 LAB — CBC WITH DIFFERENTIAL (CANCER CENTER ONLY)
Abs Immature Granulocytes: 0.01 K/uL (ref 0.00–0.07)
Basophils Absolute: 0 K/uL (ref 0.0–0.1)
Basophils Relative: 0 %
Eosinophils Absolute: 0.2 K/uL (ref 0.0–0.5)
Eosinophils Relative: 4 %
HCT: 42.5 % (ref 39.0–52.0)
Hemoglobin: 13.7 g/dL (ref 13.0–17.0)
Immature Granulocytes: 0 %
Lymphocytes Relative: 25 %
Lymphs Abs: 1.7 K/uL (ref 0.7–4.0)
MCH: 27.1 pg (ref 26.0–34.0)
MCHC: 32.2 g/dL (ref 30.0–36.0)
MCV: 84 fL (ref 80.0–100.0)
Monocytes Absolute: 0.7 K/uL (ref 0.1–1.0)
Monocytes Relative: 11 %
Neutro Abs: 4.1 K/uL (ref 1.7–7.7)
Neutrophils Relative %: 60 %
Platelet Count: 196 K/uL (ref 150–400)
RBC: 5.06 MIL/uL (ref 4.22–5.81)
RDW: 16.6 % — ABNORMAL HIGH (ref 11.5–15.5)
WBC Count: 6.7 K/uL (ref 4.0–10.5)
nRBC: 0 % (ref 0.0–0.2)

## 2024-05-18 LAB — CMP (CANCER CENTER ONLY)
ALT: 19 U/L (ref 0–44)
AST: 22 U/L (ref 15–41)
Albumin: 4.5 g/dL (ref 3.5–5.0)
Alkaline Phosphatase: 89 U/L (ref 38–126)
Anion gap: 13 (ref 5–15)
BUN: 19 mg/dL (ref 8–23)
CO2: 22 mmol/L (ref 22–32)
Calcium: 9.3 mg/dL (ref 8.9–10.3)
Chloride: 103 mmol/L (ref 98–111)
Creatinine: 0.79 mg/dL (ref 0.61–1.24)
GFR, Estimated: >60 mL/min (ref >60)
Glucose, Bld: 95 mg/dL (ref 70–99)
Potassium: 4 mmol/L (ref 3.5–5.1)
Sodium: 138 mmol/L (ref 135–145)
Total Bilirubin: 0.6 mg/dL (ref 0.0–1.2)
Total Protein: 7.2 g/dL (ref 6.5–8.1)

## 2024-05-18 NOTE — Patient Instructions (Signed)

## 2024-05-22 ENCOUNTER — Other Ambulatory Visit: Payer: Self-pay

## 2024-05-22 ENCOUNTER — Ambulatory Visit: Payer: Self-pay | Admitting: Medical Oncology

## 2024-05-22 ENCOUNTER — Ambulatory Visit
Admission: RE | Admit: 2024-05-22 | Discharge: 2024-05-22 | Disposition: A | Discharge status: Home / Self Care | Source: Ambulatory Visit | Attending: Radiation Oncology | Admitting: Radiation Oncology

## 2024-05-22 DIAGNOSIS — C169 Malignant neoplasm of stomach, unspecified: Secondary | ICD-10-CM | POA: Diagnosis not present

## 2024-05-22 DIAGNOSIS — Z510 Encounter for antineoplastic radiation therapy: Secondary | ICD-10-CM | POA: Diagnosis not present

## 2024-05-22 DIAGNOSIS — C160 Malignant neoplasm of cardia: Secondary | ICD-10-CM | POA: Diagnosis not present

## 2024-05-22 LAB — RAD ONC ARIA SESSION SUMMARY
Course Elapsed Days: 0
Plan Fractions Treated to Date: 1
Plan Prescribed Dose Per Fraction: 1.8 Gy
Plan Total Fractions Prescribed: 25
Plan Total Prescribed Dose: 45 Gy
Reference Point Dosage Given to Date: 1.8 Gy
Reference Point Session Dosage Given: 1.8 Gy
Session Number: 1

## 2024-05-23 ENCOUNTER — Ambulatory Visit
Admission: RE | Admit: 2024-05-23 | Discharge: 2024-05-23 | Disposition: A | Discharge status: Home / Self Care | Source: Ambulatory Visit | Attending: Radiation Oncology | Admitting: Radiation Oncology

## 2024-05-23 ENCOUNTER — Other Ambulatory Visit: Payer: Self-pay

## 2024-05-23 DIAGNOSIS — C160 Malignant neoplasm of cardia: Secondary | ICD-10-CM | POA: Diagnosis not present

## 2024-05-23 DIAGNOSIS — C169 Malignant neoplasm of stomach, unspecified: Secondary | ICD-10-CM

## 2024-05-23 DIAGNOSIS — Z510 Encounter for antineoplastic radiation therapy: Secondary | ICD-10-CM | POA: Diagnosis not present

## 2024-05-23 LAB — RAD ONC ARIA SESSION SUMMARY
Course Elapsed Days: 1
Plan Fractions Treated to Date: 2
Plan Prescribed Dose Per Fraction: 1.8 Gy
Plan Total Fractions Prescribed: 25
Plan Total Prescribed Dose: 45 Gy
Reference Point Dosage Given to Date: 3.6 Gy
Reference Point Session Dosage Given: 1.8 Gy
Session Number: 2

## 2024-05-23 MED ORDER — SONAFINE EX EMUL: 1.0000 | Freq: Two times a day (BID) | CUTANEOUS | Status: DC

## 2024-05-23 MED ORDER — SONAFINE EX EMUL
1.0000 | Freq: Once | CUTANEOUS | Status: AC
  Administered 2024-05-23: 1 via TOPICAL

## 2024-05-24 ENCOUNTER — Other Ambulatory Visit: Payer: Self-pay

## 2024-05-24 ENCOUNTER — Ambulatory Visit
Admission: RE | Admit: 2024-05-24 | Discharge: 2024-05-24 | Discharge status: Home / Self Care | Source: Ambulatory Visit | Attending: Radiation Oncology | Admitting: Radiation Oncology

## 2024-05-24 DIAGNOSIS — C169 Malignant neoplasm of stomach, unspecified: Secondary | ICD-10-CM | POA: Diagnosis not present

## 2024-05-24 DIAGNOSIS — Z510 Encounter for antineoplastic radiation therapy: Secondary | ICD-10-CM | POA: Diagnosis not present

## 2024-05-24 DIAGNOSIS — C160 Malignant neoplasm of cardia: Secondary | ICD-10-CM | POA: Diagnosis not present

## 2024-05-24 LAB — RAD ONC ARIA SESSION SUMMARY
Course Elapsed Days: 2
Plan Fractions Treated to Date: 3
Plan Prescribed Dose Per Fraction: 1.8 Gy
Plan Total Fractions Prescribed: 25
Plan Total Prescribed Dose: 45 Gy
Reference Point Dosage Given to Date: 5.4 Gy
Reference Point Session Dosage Given: 1.8 Gy
Session Number: 3

## 2024-05-24 MED ORDER — LABETALOL HCL 100 MG PO TABS: 150.0000 mg | ORAL_TABLET | Freq: Two times a day (BID) | ORAL | 0 refills | Status: AC

## 2024-05-24 NOTE — Addendum Note (Signed)
 Addended by: DARYL SETTER on: 05/24/2024 10:41 AM   Modules accepted: Orders

## 2024-05-25 ENCOUNTER — Other Ambulatory Visit: Payer: Self-pay

## 2024-05-25 ENCOUNTER — Ambulatory Visit
Admission: RE | Admit: 2024-05-25 | Discharge: 2024-05-25 | Disposition: A | Discharge status: Home / Self Care | Source: Ambulatory Visit | Attending: Radiation Oncology | Admitting: Radiation Oncology

## 2024-05-25 DIAGNOSIS — C169 Malignant neoplasm of stomach, unspecified: Secondary | ICD-10-CM | POA: Diagnosis not present

## 2024-05-25 DIAGNOSIS — Z510 Encounter for antineoplastic radiation therapy: Secondary | ICD-10-CM | POA: Diagnosis not present

## 2024-05-25 DIAGNOSIS — C160 Malignant neoplasm of cardia: Secondary | ICD-10-CM | POA: Diagnosis not present

## 2024-05-25 LAB — RAD ONC ARIA SESSION SUMMARY
Course Elapsed Days: 3
Plan Fractions Treated to Date: 4
Plan Prescribed Dose Per Fraction: 1.8 Gy
Plan Total Fractions Prescribed: 25
Plan Total Prescribed Dose: 45 Gy
Reference Point Dosage Given to Date: 7.2 Gy
Reference Point Session Dosage Given: 1.8 Gy
Session Number: 4

## 2024-05-26 ENCOUNTER — Ambulatory Visit
Admission: RE | Admit: 2024-05-26 | Discharge: 2024-05-26 | Disposition: A | Discharge status: Home / Self Care | Source: Ambulatory Visit | Attending: Radiation Oncology | Admitting: Radiation Oncology

## 2024-05-26 ENCOUNTER — Inpatient Hospital Stay

## 2024-05-26 ENCOUNTER — Encounter: Payer: Self-pay | Admitting: Hematology & Oncology

## 2024-05-26 ENCOUNTER — Encounter: Payer: Self-pay | Admitting: *Deleted

## 2024-05-26 ENCOUNTER — Inpatient Hospital Stay: Admitting: Hematology & Oncology

## 2024-05-26 ENCOUNTER — Other Ambulatory Visit: Payer: Self-pay

## 2024-05-26 VITALS — BP 125/76 | HR 76 | Temp 98.1°F | Resp 20 | Ht 67.0 in | Wt 157.9 lb

## 2024-05-26 DIAGNOSIS — Z923 Personal history of irradiation: Secondary | ICD-10-CM | POA: Diagnosis not present

## 2024-05-26 DIAGNOSIS — Z79899 Other long term (current) drug therapy: Secondary | ICD-10-CM | POA: Diagnosis not present

## 2024-05-26 DIAGNOSIS — Z510 Encounter for antineoplastic radiation therapy: Secondary | ICD-10-CM | POA: Diagnosis not present

## 2024-05-26 DIAGNOSIS — C160 Malignant neoplasm of cardia: Secondary | ICD-10-CM | POA: Diagnosis not present

## 2024-05-26 DIAGNOSIS — D500 Iron deficiency anemia secondary to blood loss (chronic): Secondary | ICD-10-CM | POA: Diagnosis not present

## 2024-05-26 DIAGNOSIS — C169 Malignant neoplasm of stomach, unspecified: Secondary | ICD-10-CM | POA: Diagnosis not present

## 2024-05-26 LAB — CMP (CANCER CENTER ONLY)
ALT: 18 U/L (ref 0–44)
AST: 21 U/L (ref 15–41)
Albumin: 4.4 g/dL (ref 3.5–5.0)
Alkaline Phosphatase: 85 U/L (ref 38–126)
Anion gap: 11 (ref 5–15)
BUN: 21 mg/dL (ref 8–23)
CO2: 23 mmol/L (ref 22–32)
Calcium: 9.5 mg/dL (ref 8.9–10.3)
Chloride: 102 mmol/L (ref 98–111)
Creatinine: 0.81 mg/dL (ref 0.61–1.24)
GFR, Estimated: >60 mL/min (ref >60)
Glucose, Bld: 92 mg/dL (ref 70–99)
Potassium: 4.4 mmol/L (ref 3.5–5.1)
Sodium: 136 mmol/L (ref 135–145)
Total Bilirubin: 0.6 mg/dL (ref 0.0–1.2)
Total Protein: 7.3 g/dL (ref 6.5–8.1)

## 2024-05-26 LAB — CBC WITH DIFFERENTIAL (CANCER CENTER ONLY)
Abs Immature Granulocytes: 0.01 K/uL (ref 0.00–0.07)
Basophils Absolute: 0 K/uL (ref 0.0–0.1)
Basophils Relative: 0 %
Eosinophils Absolute: 0.2 K/uL (ref 0.0–0.5)
Eosinophils Relative: 3 %
HCT: 41.8 % (ref 39.0–52.0)
Hemoglobin: 13.7 g/dL (ref 13.0–17.0)
Immature Granulocytes: 0 %
Lymphocytes Relative: 13 %
Lymphs Abs: 0.7 K/uL (ref 0.7–4.0)
MCH: 27.5 pg (ref 26.0–34.0)
MCHC: 32.8 g/dL (ref 30.0–36.0)
MCV: 83.8 fL (ref 80.0–100.0)
Monocytes Absolute: 0.6 K/uL (ref 0.1–1.0)
Monocytes Relative: 11 %
Neutro Abs: 3.8 K/uL (ref 1.7–7.7)
Neutrophils Relative %: 73 %
Platelet Count: 217 K/uL (ref 150–400)
RBC: 4.99 MIL/uL (ref 4.22–5.81)
RDW: 14.9 % (ref 11.5–15.5)
WBC Count: 5.3 K/uL (ref 4.0–10.5)
nRBC: 0 % (ref 0.0–0.2)

## 2024-05-26 LAB — IRON AND IRON BINDING CAPACITY (CC-WL,HP ONLY)
Iron: 130 ug/dL (ref 45–182)
Saturation Ratios: 30 % (ref 17.9–39.5)
TIBC: 431 ug/dL (ref 250–450)
UIBC: 301 ug/dL

## 2024-05-26 LAB — RAD ONC ARIA SESSION SUMMARY
Course Elapsed Days: 4
Plan Fractions Treated to Date: 5
Plan Prescribed Dose Per Fraction: 1.8 Gy
Plan Total Fractions Prescribed: 25
Plan Total Prescribed Dose: 45 Gy
Reference Point Dosage Given to Date: 9 Gy
Reference Point Session Dosage Given: 1.8 Gy
Session Number: 5

## 2024-05-26 LAB — FERRITIN: Ferritin: 124 ng/mL (ref 24–336)

## 2024-05-26 NOTE — Patient Instructions (Signed)

## 2024-05-26 NOTE — Progress Notes (Signed)
 Patient started concurrent ChemoRT this week. He is on day five radiation + Xeloda . So far no toxicities. Will continue on current plan.   Oncology Nurse Navigator Documentation     05/26/2024    8:15 AM  Oncology Nurse Navigator Flowsheets  Planned Course of Treatment Chemo/Radiation Concurrent  Phase of Treatment Chemo/Radiation Concurrent  Chemo/Radiation Concurrent Actual Start Date: 05/22/2024  Chemo/Radiation Concurrent Expected End Date: 06/29/2024  Navigator Follow Up Date: 06/16/2024  Navigator Follow Up Reason: Follow-up Appointment  Navigator Location [facility]  Navigator Encounter Type Treatment  Patient Visit Type MedOnc  Treatment Phase Active Tx  Barriers/Navigation Needs No Barriers At This Time  Interventions None Required  Acuity Level 1-No Barriers  Support Groups/Services Friends and Family  Time Spent with Patient 15

## 2024-05-26 NOTE — Progress Notes (Signed)
 Hematology and Oncology Follow Up Visit  Matthew Lee [id number] 04-27-1956 68 y.o. 05/26/2024   Principle Diagnosis:  Locally advanced adenocarcinoma of the stomach Iron deficiency anemia-GI bleed  Current Therapy:   Neoadjuvant FLOT-s/p cycle #2 - start on 02/17/2024 Feraheme given on 02/17/2024 XRT + Xeloda  (2000 mg po q day)     Interim History:  Matthew Lee is back for follow-up.  He now started radiation therapy.  He started the Xeloda .  He is doing quite nicely.  So far, there is been no toxicity.  He has only had 5 radiation treatments.  He still has a long ways to go.  He is still working.  He has had no issues with nausea or vomiting.  He has had no problems with cough or shortness of breath.  He has had no diarrhea.  He has had no melena or bright red blood per rectum.  When we last saw him, his ferritin was 125 with an iron saturation of 23%.  He has no problems with dysphagia or odynophagia.  He has had no rashes.  There is been no leg swelling.  He has had maybe a little bit of tingling in the toes.  Overall, I would have to say that his performance status is ECOG 1.   Medications:  Current Outpatient Medications:    amLODipine  (NORVASC ) 10 MG tablet, TAKE 1 TABLET DAILY, Disp: 90 tablet, Rfl: 3   atorvastatin  (LIPITOR) 10 MG tablet, Take 1 tablet (10 mg total) by mouth daily., Disp: 90 tablet, Rfl: 3   capecitabine  (XELODA ) 500 MG tablet, Take 4 tablets (2,000 mg total) by mouth daily after breakfast. Take within 30 minutes after meal. Take only on days of radiation, Monday thru Friday. NONE on the weekend, Disp: 120 tablet, Rfl: 0   labetalol  (NORMODYNE ) 100 MG tablet, Take 1.5 tablets (150 mg total) by mouth 2 (two) times daily., Disp: 270 tablet, Rfl: 0   levETIRAcetam  (KEPPRA ) 500 MG tablet, Take 1 tablet (500 mg total) by mouth 2 (two) times daily. (Patient not taking: Reported on 05/26/2024), Disp: 60 tablet, Rfl: 2  Allergies: No Known Allergies  Past Medical  History, Surgical history, Social history, and Family History were reviewed and updated.  Review of Systems: Review of Systems  Constitutional: Negative.   HENT:  Negative.    Eyes: Negative.   Respiratory: Negative.    Cardiovascular: Negative.   Gastrointestinal: Negative.   Endocrine: Negative.   Genitourinary: Negative.    Musculoskeletal: Negative.   Skin: Negative.   Neurological: Negative.   Hematological: Negative.   Psychiatric/Behavioral: Negative.      Physical Exam: Vital signs show temperature of 98.1.  Pulse 76.  Blood pressure 125/76.  Weight is 157 pounds.   Wt Readings from Last 3 Encounters:  05/26/24 157 lb 14.4 oz (71.6 kg)  05/09/24 159 lb (72.1 kg)  05/08/24 161 lb (73 kg)    Physical Exam Vitals reviewed.  HENT:     Head: Normocephalic and atraumatic.  Eyes:     Pupils: Pupils are equal, round, and reactive to light.  Cardiovascular:     Rate and Rhythm: Normal rate and regular rhythm.     Heart sounds: Normal heart sounds.  Pulmonary:     Effort: Pulmonary effort is normal.     Breath sounds: Normal breath sounds.  Abdominal:     General: Bowel sounds are normal.     Palpations: Abdomen is soft.  Musculoskeletal:  General: No tenderness or deformity. Normal range of motion.     Cervical back: Normal range of motion.  Lymphadenopathy:     Cervical: No cervical adenopathy.  Skin:    General: Skin is warm and dry.     Findings: No erythema or Gre[place].  Neurological:     Mental Status: He is alert and oriented to person, place, and time.  Psychiatric:        Behavior: Behavior normal.        Thought Content: Thought content normal.        Judgment: Judgment normal.      Lab Results  Component Value Date   WBC 5.3 05/26/2024   HGB 13.7 05/26/2024   HCT 41.8 05/26/2024   MCV 83.8 05/26/2024   PLT 217 05/26/2024     Chemistry      Component Value Date/Time   NA 138 05/18/2024 0849   K 4.0 05/18/2024 0849   CL 103 05/18/2024  0849   CO2 22 05/18/2024 0849   BUN 19 05/18/2024 0849   CREATININE 0.79 05/18/2024 0849   CREATININE 0.85 01/21/2018 1500      Component Value Date/Time   CALCIUM  9.3 05/18/2024 0849   ALKPHOS 89 05/18/2024 0849   AST 22 05/18/2024 0849   ALT 19 05/18/2024 0849   BILITOT 0.6 05/18/2024 0849      Impression and Plan: Matthew Lee is a very nice 68 year old white male.  He has hopefully noted but localized adenocarcinoma of the stomach.  We now have him on radiation and Xeloda .  He did not wish to have surgery.  Hopefully, we can get a continued response and that we can get him into a remission and possible cure.  I am happy that he has done well so far.  I realize that we do have a long ways to go.  For right now, we will plan to get him back in 3 weeks.  We concerning get him back sooner if he has any issues.   Maude JONELLE Crease, MD 8/29/20258:10 AM

## 2024-05-30 ENCOUNTER — Other Ambulatory Visit: Payer: Self-pay

## 2024-05-30 ENCOUNTER — Ambulatory Visit
Admission: RE | Admit: 2024-05-30 | Discharge: 2024-05-30 | Disposition: A | Discharge status: Home / Self Care | Source: Ambulatory Visit | Attending: Radiation Oncology | Admitting: Radiation Oncology

## 2024-05-30 ENCOUNTER — Encounter: Payer: Self-pay | Admitting: Family

## 2024-05-30 ENCOUNTER — Encounter: Payer: Self-pay | Admitting: Hematology & Oncology

## 2024-05-30 DIAGNOSIS — C160 Malignant neoplasm of cardia: Secondary | ICD-10-CM | POA: Diagnosis not present

## 2024-05-30 DIAGNOSIS — Z510 Encounter for antineoplastic radiation therapy: Secondary | ICD-10-CM | POA: Insufficient documentation

## 2024-05-30 DIAGNOSIS — C169 Malignant neoplasm of stomach, unspecified: Secondary | ICD-10-CM | POA: Diagnosis not present

## 2024-05-30 LAB — RAD ONC ARIA SESSION SUMMARY
Course Elapsed Days: 8
Plan Fractions Treated to Date: 6
Plan Prescribed Dose Per Fraction: 1.8 Gy
Plan Total Fractions Prescribed: 25
Plan Total Prescribed Dose: 45 Gy
Reference Point Dosage Given to Date: 10.8 Gy
Reference Point Session Dosage Given: 1.8 Gy
Session Number: 6

## 2024-05-31 ENCOUNTER — Other Ambulatory Visit: Payer: Self-pay

## 2024-05-31 ENCOUNTER — Ambulatory Visit
Admission: RE | Admit: 2024-05-31 | Discharge: 2024-05-31 | Disposition: A | Discharge status: Home / Self Care | Source: Ambulatory Visit | Attending: Radiation Oncology | Admitting: Radiation Oncology

## 2024-05-31 DIAGNOSIS — C160 Malignant neoplasm of cardia: Secondary | ICD-10-CM | POA: Diagnosis not present

## 2024-05-31 DIAGNOSIS — Z510 Encounter for antineoplastic radiation therapy: Secondary | ICD-10-CM | POA: Diagnosis not present

## 2024-05-31 DIAGNOSIS — C169 Malignant neoplasm of stomach, unspecified: Secondary | ICD-10-CM | POA: Diagnosis not present

## 2024-05-31 LAB — RAD ONC ARIA SESSION SUMMARY
Course Elapsed Days: 9
Plan Fractions Treated to Date: 7
Plan Prescribed Dose Per Fraction: 1.8 Gy
Plan Total Fractions Prescribed: 25
Plan Total Prescribed Dose: 45 Gy
Reference Point Dosage Given to Date: 12.6 Gy
Reference Point Session Dosage Given: 1.8 Gy
Session Number: 7

## 2024-06-01 ENCOUNTER — Other Ambulatory Visit: Payer: Self-pay

## 2024-06-01 ENCOUNTER — Ambulatory Visit
Admission: RE | Admit: 2024-06-01 | Discharge: 2024-06-01 | Disposition: A | Discharge status: Home / Self Care | Source: Ambulatory Visit | Attending: Radiation Oncology | Admitting: Radiation Oncology

## 2024-06-01 DIAGNOSIS — Z510 Encounter for antineoplastic radiation therapy: Secondary | ICD-10-CM | POA: Diagnosis not present

## 2024-06-01 LAB — RAD ONC ARIA SESSION SUMMARY
Course Elapsed Days: 10
Plan Fractions Treated to Date: 8
Plan Prescribed Dose Per Fraction: 1.8 Gy
Plan Total Fractions Prescribed: 25
Plan Total Prescribed Dose: 45 Gy
Reference Point Dosage Given to Date: 14.4 Gy
Reference Point Session Dosage Given: 1.8 Gy
Session Number: 8

## 2024-06-02 ENCOUNTER — Other Ambulatory Visit: Payer: Self-pay

## 2024-06-02 ENCOUNTER — Ambulatory Visit
Admission: RE | Admit: 2024-06-02 | Discharge: 2024-06-02 | Disposition: A | Discharge status: Home / Self Care | Source: Ambulatory Visit | Attending: Radiation Oncology | Admitting: Radiation Oncology

## 2024-06-02 DIAGNOSIS — C160 Malignant neoplasm of cardia: Secondary | ICD-10-CM | POA: Diagnosis not present

## 2024-06-02 DIAGNOSIS — C169 Malignant neoplasm of stomach, unspecified: Secondary | ICD-10-CM | POA: Diagnosis not present

## 2024-06-02 DIAGNOSIS — Z510 Encounter for antineoplastic radiation therapy: Secondary | ICD-10-CM | POA: Diagnosis not present

## 2024-06-02 LAB — RAD ONC ARIA SESSION SUMMARY
Course Elapsed Days: 11
Plan Fractions Treated to Date: 9
Plan Prescribed Dose Per Fraction: 1.8 Gy
Plan Total Fractions Prescribed: 25
Plan Total Prescribed Dose: 45 Gy
Reference Point Dosage Given to Date: 16.2 Gy
Reference Point Session Dosage Given: 1.8 Gy
Session Number: 9

## 2024-06-05 ENCOUNTER — Other Ambulatory Visit: Payer: Self-pay

## 2024-06-05 ENCOUNTER — Ambulatory Visit
Admission: RE | Admit: 2024-06-05 | Discharge: 2024-06-05 | Disposition: A | Discharge status: Home / Self Care | Source: Ambulatory Visit | Attending: Radiation Oncology | Admitting: Radiation Oncology

## 2024-06-05 DIAGNOSIS — Z510 Encounter for antineoplastic radiation therapy: Secondary | ICD-10-CM | POA: Diagnosis not present

## 2024-06-05 DIAGNOSIS — C169 Malignant neoplasm of stomach, unspecified: Secondary | ICD-10-CM | POA: Diagnosis not present

## 2024-06-05 LAB — RAD ONC ARIA SESSION SUMMARY
Course Elapsed Days: 14
Plan Fractions Treated to Date: 10
Plan Prescribed Dose Per Fraction: 1.8 Gy
Plan Total Fractions Prescribed: 25
Plan Total Prescribed Dose: 45 Gy
Reference Point Dosage Given to Date: 18 Gy
Reference Point Session Dosage Given: 1.8 Gy
Session Number: 10

## 2024-06-06 ENCOUNTER — Other Ambulatory Visit: Payer: Self-pay

## 2024-06-06 ENCOUNTER — Other Ambulatory Visit: Payer: Self-pay | Admitting: Radiation Oncology

## 2024-06-06 ENCOUNTER — Ambulatory Visit
Admission: RE | Admit: 2024-06-06 | Discharge: 2024-06-06 | Disposition: A | Discharge status: Home / Self Care | Source: Ambulatory Visit | Attending: Radiation Oncology | Admitting: Radiation Oncology

## 2024-06-06 DIAGNOSIS — Z510 Encounter for antineoplastic radiation therapy: Secondary | ICD-10-CM | POA: Diagnosis not present

## 2024-06-06 LAB — RAD ONC ARIA SESSION SUMMARY
Course Elapsed Days: 15
Plan Fractions Treated to Date: 11
Plan Prescribed Dose Per Fraction: 1.8 Gy
Plan Total Fractions Prescribed: 25
Plan Total Prescribed Dose: 45 Gy
Reference Point Dosage Given to Date: 19.8 Gy
Reference Point Session Dosage Given: 1.8 Gy
Session Number: 11

## 2024-06-06 MED ORDER — SUCRALFATE 1 G PO TABS: 1.0000 g | ORAL_TABLET | Freq: Three times a day (TID) | ORAL | 1 refills | Status: DC

## 2024-06-07 ENCOUNTER — Ambulatory Visit
Admission: RE | Admit: 2024-06-07 | Discharge: 2024-06-07 | Disposition: A | Discharge status: Home / Self Care | Source: Ambulatory Visit | Attending: Radiation Oncology | Admitting: Radiation Oncology

## 2024-06-07 ENCOUNTER — Other Ambulatory Visit: Payer: Self-pay

## 2024-06-07 DIAGNOSIS — C160 Malignant neoplasm of cardia: Secondary | ICD-10-CM | POA: Diagnosis not present

## 2024-06-07 DIAGNOSIS — Z510 Encounter for antineoplastic radiation therapy: Secondary | ICD-10-CM | POA: Diagnosis not present

## 2024-06-07 DIAGNOSIS — C169 Malignant neoplasm of stomach, unspecified: Secondary | ICD-10-CM | POA: Diagnosis not present

## 2024-06-07 LAB — RAD ONC ARIA SESSION SUMMARY
Course Elapsed Days: 16
Plan Fractions Treated to Date: 12
Plan Prescribed Dose Per Fraction: 1.8 Gy
Plan Total Fractions Prescribed: 25
Plan Total Prescribed Dose: 45 Gy
Reference Point Dosage Given to Date: 21.6 Gy
Reference Point Session Dosage Given: 1.8 Gy
Session Number: 12

## 2024-06-08 ENCOUNTER — Ambulatory Visit
Admission: RE | Admit: 2024-06-08 | Discharge: 2024-06-08 | Disposition: A | Discharge status: Home / Self Care | Source: Ambulatory Visit | Attending: Radiation Oncology | Admitting: Radiation Oncology

## 2024-06-08 ENCOUNTER — Other Ambulatory Visit: Payer: Self-pay

## 2024-06-08 ENCOUNTER — Encounter: Payer: Self-pay | Admitting: Hematology & Oncology

## 2024-06-08 ENCOUNTER — Other Ambulatory Visit: Payer: Self-pay | Admitting: Hematology & Oncology

## 2024-06-08 DIAGNOSIS — Z510 Encounter for antineoplastic radiation therapy: Secondary | ICD-10-CM | POA: Diagnosis not present

## 2024-06-08 DIAGNOSIS — C160 Malignant neoplasm of cardia: Secondary | ICD-10-CM | POA: Diagnosis not present

## 2024-06-08 DIAGNOSIS — C169 Malignant neoplasm of stomach, unspecified: Secondary | ICD-10-CM | POA: Diagnosis not present

## 2024-06-08 LAB — RAD ONC ARIA SESSION SUMMARY
Course Elapsed Days: 17
Plan Fractions Treated to Date: 13
Plan Prescribed Dose Per Fraction: 1.8 Gy
Plan Total Fractions Prescribed: 25
Plan Total Prescribed Dose: 45 Gy
Reference Point Dosage Given to Date: 23.4 Gy
Reference Point Session Dosage Given: 1.8 Gy
Session Number: 13

## 2024-06-08 MED ORDER — CAPECITABINE 500 MG PO TABS
2000.0000 mg | ORAL_TABLET | Freq: Every day | ORAL | 0 refills | Status: DC
  Filled 2024-06-08: qty 120, 30d supply, fill #0

## 2024-06-09 ENCOUNTER — Ambulatory Visit
Admission: RE | Admit: 2024-06-09 | Discharge: 2024-06-09 | Disposition: A | Discharge status: Home / Self Care | Source: Ambulatory Visit | Attending: Radiation Oncology | Admitting: Radiation Oncology

## 2024-06-09 ENCOUNTER — Other Ambulatory Visit: Payer: Self-pay

## 2024-06-09 DIAGNOSIS — Z510 Encounter for antineoplastic radiation therapy: Secondary | ICD-10-CM | POA: Diagnosis not present

## 2024-06-09 LAB — RAD ONC ARIA SESSION SUMMARY
Course Elapsed Days: 18
Plan Fractions Treated to Date: 14
Plan Prescribed Dose Per Fraction: 1.8 Gy
Plan Total Fractions Prescribed: 25
Plan Total Prescribed Dose: 45 Gy
Reference Point Dosage Given to Date: 25.2 Gy
Reference Point Session Dosage Given: 1.8 Gy
Session Number: 14

## 2024-06-12 ENCOUNTER — Other Ambulatory Visit: Payer: Self-pay

## 2024-06-12 ENCOUNTER — Ambulatory Visit
Admission: RE | Admit: 2024-06-12 | Discharge: 2024-06-12 | Disposition: A | Discharge status: Home / Self Care | Source: Ambulatory Visit | Attending: Radiation Oncology | Admitting: Radiation Oncology

## 2024-06-12 DIAGNOSIS — Z510 Encounter for antineoplastic radiation therapy: Secondary | ICD-10-CM | POA: Diagnosis not present

## 2024-06-12 LAB — RAD ONC ARIA SESSION SUMMARY
Course Elapsed Days: 21
Plan Fractions Treated to Date: 15
Plan Prescribed Dose Per Fraction: 1.8 Gy
Plan Total Fractions Prescribed: 25
Plan Total Prescribed Dose: 45 Gy
Reference Point Dosage Given to Date: 27 Gy
Reference Point Session Dosage Given: 1.8 Gy
Session Number: 15

## 2024-06-12 NOTE — Progress Notes (Signed)
 Disenrolling - Xeloda  taken with radiation only and per 9.11.25 patient message radiation will complete on 10.2.25 and the patient has enough medication on hand to complete therapy.

## 2024-06-13 ENCOUNTER — Other Ambulatory Visit: Payer: Self-pay

## 2024-06-13 ENCOUNTER — Ambulatory Visit
Admission: RE | Admit: 2024-06-13 | Discharge: 2024-06-13 | Disposition: A | Discharge status: Home / Self Care | Source: Ambulatory Visit | Attending: Radiation Oncology | Admitting: Radiation Oncology

## 2024-06-13 DIAGNOSIS — Z510 Encounter for antineoplastic radiation therapy: Secondary | ICD-10-CM | POA: Diagnosis not present

## 2024-06-13 LAB — RAD ONC ARIA SESSION SUMMARY
Course Elapsed Days: 22
Plan Fractions Treated to Date: 16
Plan Prescribed Dose Per Fraction: 1.8 Gy
Plan Total Fractions Prescribed: 25
Plan Total Prescribed Dose: 45 Gy
Reference Point Dosage Given to Date: 28.8 Gy
Reference Point Session Dosage Given: 1.8 Gy
Session Number: 16

## 2024-06-14 ENCOUNTER — Other Ambulatory Visit: Payer: Self-pay

## 2024-06-14 ENCOUNTER — Ambulatory Visit
Admission: RE | Admit: 2024-06-14 | Discharge: 2024-06-14 | Disposition: A | Discharge status: Home / Self Care | Source: Ambulatory Visit | Attending: Radiation Oncology | Admitting: Radiation Oncology

## 2024-06-14 DIAGNOSIS — Z510 Encounter for antineoplastic radiation therapy: Secondary | ICD-10-CM | POA: Diagnosis not present

## 2024-06-14 LAB — RAD ONC ARIA SESSION SUMMARY
Course Elapsed Days: 23
Plan Fractions Treated to Date: 17
Plan Prescribed Dose Per Fraction: 1.8 Gy
Plan Total Fractions Prescribed: 25
Plan Total Prescribed Dose: 45 Gy
Reference Point Dosage Given to Date: 30.6 Gy
Reference Point Session Dosage Given: 1.8 Gy
Session Number: 17

## 2024-06-15 ENCOUNTER — Ambulatory Visit
Admission: RE | Admit: 2024-06-15 | Discharge: 2024-06-15 | Disposition: A | Discharge status: Home / Self Care | Source: Ambulatory Visit | Attending: Radiation Oncology | Admitting: Radiation Oncology

## 2024-06-15 ENCOUNTER — Other Ambulatory Visit: Payer: Self-pay

## 2024-06-15 DIAGNOSIS — Z510 Encounter for antineoplastic radiation therapy: Secondary | ICD-10-CM | POA: Diagnosis not present

## 2024-06-15 LAB — RAD ONC ARIA SESSION SUMMARY
Course Elapsed Days: 24
Plan Fractions Treated to Date: 18
Plan Prescribed Dose Per Fraction: 1.8 Gy
Plan Total Fractions Prescribed: 25
Plan Total Prescribed Dose: 45 Gy
Reference Point Dosage Given to Date: 32.4 Gy
Reference Point Session Dosage Given: 1.8 Gy
Session Number: 18

## 2024-06-16 ENCOUNTER — Encounter: Payer: Self-pay | Admitting: *Deleted

## 2024-06-16 ENCOUNTER — Other Ambulatory Visit: Payer: Self-pay

## 2024-06-16 ENCOUNTER — Inpatient Hospital Stay (HOSPITAL_BASED_OUTPATIENT_CLINIC_OR_DEPARTMENT_OTHER): Admitting: Hematology & Oncology

## 2024-06-16 ENCOUNTER — Inpatient Hospital Stay: Attending: Hematology & Oncology

## 2024-06-16 ENCOUNTER — Inpatient Hospital Stay

## 2024-06-16 ENCOUNTER — Ambulatory Visit
Admission: RE | Admit: 2024-06-16 | Discharge: 2024-06-16 | Disposition: A | Discharge status: Home / Self Care | Source: Ambulatory Visit | Attending: Radiation Oncology | Admitting: Radiation Oncology

## 2024-06-16 VITALS — BP 123/77 | HR 66 | Temp 98.3°F | Resp 17 | Ht 67.0 in | Wt 153.0 lb

## 2024-06-16 DIAGNOSIS — C160 Malignant neoplasm of cardia: Secondary | ICD-10-CM | POA: Diagnosis not present

## 2024-06-16 DIAGNOSIS — C169 Malignant neoplasm of stomach, unspecified: Secondary | ICD-10-CM | POA: Diagnosis not present

## 2024-06-16 DIAGNOSIS — Z510 Encounter for antineoplastic radiation therapy: Secondary | ICD-10-CM | POA: Diagnosis not present

## 2024-06-16 LAB — CMP (CANCER CENTER ONLY)
ALT: 20 U/L (ref 0–44)
AST: 25 U/L (ref 15–41)
Albumin: 4.3 g/dL (ref 3.5–5.0)
Alkaline Phosphatase: 73 U/L (ref 38–126)
Anion gap: 10 (ref 5–15)
BUN: 16 mg/dL (ref 8–23)
CO2: 25 mmol/L (ref 22–32)
Calcium: 9.4 mg/dL (ref 8.9–10.3)
Chloride: 103 mmol/L (ref 98–111)
Creatinine: 0.75 mg/dL (ref 0.61–1.24)
GFR, Estimated: >60 mL/min (ref >60)
Glucose, Bld: 96 mg/dL (ref 70–99)
Potassium: 4.5 mmol/L (ref 3.5–5.1)
Sodium: 138 mmol/L (ref 135–145)
Total Bilirubin: 0.3 mg/dL (ref 0.0–1.2)
Total Protein: 6.9 g/dL (ref 6.5–8.1)

## 2024-06-16 LAB — IRON AND IRON BINDING CAPACITY (CC-WL,HP ONLY)
Iron: 114 ug/dL (ref 45–182)
Saturation Ratios: 29 % (ref 17.9–39.5)
TIBC: 399 ug/dL (ref 250–450)
UIBC: 285 ug/dL

## 2024-06-16 LAB — RAD ONC ARIA SESSION SUMMARY
Course Elapsed Days: 25
Plan Fractions Treated to Date: 19
Plan Prescribed Dose Per Fraction: 1.8 Gy
Plan Total Fractions Prescribed: 25
Plan Total Prescribed Dose: 45 Gy
Reference Point Dosage Given to Date: 34.2 Gy
Reference Point Session Dosage Given: 1.8 Gy
Session Number: 19

## 2024-06-16 LAB — CBC WITH DIFFERENTIAL (CANCER CENTER ONLY)
Abs Immature Granulocytes: 0.01 K/uL (ref 0.00–0.07)
Basophils Absolute: 0 K/uL (ref 0.0–0.1)
Basophils Relative: 0 %
Eosinophils Absolute: 0.1 K/uL (ref 0.0–0.5)
Eosinophils Relative: 2 %
HCT: 41.2 % (ref 39.0–52.0)
Hemoglobin: 13.9 g/dL (ref 13.0–17.0)
Immature Granulocytes: 0 %
Lymphocytes Relative: 5 %
Lymphs Abs: 0.3 K/uL — ABNORMAL LOW (ref 0.7–4.0)
MCH: 28.5 pg (ref 26.0–34.0)
MCHC: 33.7 g/dL (ref 30.0–36.0)
MCV: 84.6 fL (ref 80.0–100.0)
Monocytes Absolute: 1 K/uL (ref 0.1–1.0)
Monocytes Relative: 21 %
Neutro Abs: 3.3 K/uL (ref 1.7–7.7)
Neutrophils Relative %: 72 %
Platelet Count: 146 K/uL — ABNORMAL LOW (ref 150–400)
RBC: 4.87 MIL/uL (ref 4.22–5.81)
RDW: 14.9 % (ref 11.5–15.5)
WBC Count: 4.7 K/uL (ref 4.0–10.5)
nRBC: 0 % (ref 0.0–0.2)

## 2024-06-16 LAB — FERRITIN: Ferritin: 139 ng/mL (ref 24–336)

## 2024-06-16 NOTE — Progress Notes (Signed)
 Patient is almost done with his concurrent ChemoRT. He will return in one month for post treatment assessment. Plan for scans in about two months.   Oncology Nurse Navigator Documentation     06/16/2024    8:45 AM  Oncology Nurse Navigator Flowsheets  Navigator Follow Up Date: 07/28/2024  Navigator Follow Up Reason: Follow-up Appointment  Navigator Location [facility]  Navigator Encounter Type Treatment  Patient Visit Type MedOnc  Treatment Phase Active Tx  Barriers/Navigation Needs No Barriers At This Time  Interventions None Required  Acuity Level 1-No Barriers  Support Groups/Services Friends and Family  Time Spent with Patient 15

## 2024-06-16 NOTE — Patient Instructions (Signed)

## 2024-06-16 NOTE — Progress Notes (Signed)
 Hematology and Oncology Follow Up Visit  Matthew Lee [id number] 1956/04/01 68 y.o. 06/16/2024   Principle Diagnosis:  Locally advanced adenocarcinoma of the stomach Iron deficiency anemia-GI bleed  Current Therapy:   Neoadjuvant FLOT-s/p cycle #2 - start on 02/17/2024 Feraheme given on 02/17/2024 XRT + Xeloda  (2000 mg po q day)     Interim History:  Matthew Lee is back for follow-up.  He has 8 more radiation treatments left.  I am so happy for him.  He is done incredibly well.  What is really helped him out has been Carafate .  He has been taking this.  This is really helped his stomach.  He is still eating.  He does not eat as much.  He has had a little bit of nausea but rarely has any vomiting.  He has had no melena or bright red blood per rectum.  Is been no rashes.  He has had no fever.  He has had no cough.  Again, I think is done incredibly well.  His last iron studies that we did back in August showed a ferritin of 124 with an iron saturation of 30%.  Overall, I would say that his performance status is probably ECOG 1.   Medications:  Current Outpatient Medications:    amLODipine  (NORVASC ) 10 MG tablet, TAKE 1 TABLET DAILY, Disp: 90 tablet, Rfl: 3   atorvastatin  (LIPITOR) 10 MG tablet, Take 1 tablet (10 mg total) by mouth daily., Disp: 90 tablet, Rfl: 3   capecitabine  (XELODA ) 500 MG tablet, Take 4 tablets (2,000 mg total) by mouth daily after breakfast. Take within 30 minutes after meal. Take only on days of radiation, Monday thru Friday. NONE on the weekend, Disp: 120 tablet, Rfl: 0   labetalol  (NORMODYNE ) 100 MG tablet, Take 1.5 tablets (150 mg total) by mouth 2 (two) times daily., Disp: 270 tablet, Rfl: 0   sucralfate  (CARAFATE ) 1 g tablet, Take 1 tablet (1 g total) by mouth 4 (four) times daily -  with meals and at bedtime. Crush and dissolve in 10 mL's of warm water, take 30 minutes prior to meals, Disp: 120 tablet, Rfl: 1  Allergies: No Known Allergies  Past Medical  History, Surgical history, Social history, and Family History were reviewed and updated.  Review of Systems: Review of Systems  Constitutional: Negative.   HENT:  Negative.    Eyes: Negative.   Respiratory: Negative.    Cardiovascular: Negative.   Gastrointestinal: Negative.   Endocrine: Negative.   Genitourinary: Negative.    Musculoskeletal: Negative.   Skin: Negative.   Neurological: Negative.   Hematological: Negative.   Psychiatric/Behavioral: Negative.      Physical Exam: Vital signs show temperature of 98.3.  Pulse 66.  Blood pressure 123/77.  Weight is 153 pounds.   Wt Readings from Last 3 Encounters:  06/16/24 153 lb (69.4 kg)  05/26/24 157 lb 14.4 oz (71.6 kg)  05/09/24 159 lb (72.1 kg)    Physical Exam Vitals reviewed.  HENT:     Head: Normocephalic and atraumatic.  Eyes:     Pupils: Pupils are equal, round, and reactive to light.  Cardiovascular:     Rate and Rhythm: Normal rate and regular rhythm.     Heart sounds: Normal heart sounds.  Pulmonary:     Effort: Pulmonary effort is normal.     Breath sounds: Normal breath sounds.  Abdominal:     General: Bowel sounds are normal.     Palpations: Abdomen is soft.  Musculoskeletal:  General: No tenderness or deformity. Normal range of motion.     Cervical back: Normal range of motion.  Lymphadenopathy:     Cervical: No cervical adenopathy.  Skin:    General: Skin is warm and dry.     Findings: No erythema or [place].  Neurological:     Mental Status: He is alert and oriented to person, place, and time.  Psychiatric:        Behavior: Behavior normal.        Thought Content: Thought content normal.        Judgment: Judgment normal.      Lab Results  Component Value Date   WBC 4.7 06/16/2024   HGB 13.9 06/16/2024   HCT 41.2 06/16/2024   MCV 84.6 06/16/2024   PLT 146 (L) 06/16/2024     Chemistry      Component Value Date/Time   NA 138 06/16/2024 0900   K 4.5 06/16/2024 0900   CL 103  06/16/2024 0900   CO2 25 06/16/2024 0900   BUN 16 06/16/2024 0900   CREATININE 0.75 06/16/2024 0900   CREATININE 0.85 01/21/2018 1500      Component Value Date/Time   CALCIUM  9.4 06/16/2024 0900   ALKPHOS 73 06/16/2024 0900   AST 25 06/16/2024 0900   ALT 20 06/16/2024 0900   BILITOT 0.3 06/16/2024 0900      Impression and Plan: Matthew Lee is a very nice 68 year old white male.  He has hopefully localized adenocarcinoma of the stomach.  He has had previous chemotherapy withFLOT.  He did have a response.  He did not we have surgery.  We now have him on radiation therapy and Xeloda .  I think he has done well with this.  I am happy that he is almost done.  I will plan to see him back about a month after his treatments are completed.  I told him that we will take at least 2 months or so before I would do any scans or endoscopic evaluation.  I am happy that he is doing well.  Clearly, the Carafate  has really been helpful for him.  Maude JONELLE Crease, MD 9/19/20259:47 AM

## 2024-06-19 ENCOUNTER — Other Ambulatory Visit: Payer: Self-pay

## 2024-06-19 ENCOUNTER — Encounter: Payer: Self-pay | Admitting: Family

## 2024-06-19 ENCOUNTER — Ambulatory Visit
Admission: RE | Admit: 2024-06-19 | Discharge: 2024-06-19 | Disposition: A | Discharge status: Home / Self Care | Source: Ambulatory Visit | Attending: Radiation Oncology | Admitting: Radiation Oncology

## 2024-06-19 DIAGNOSIS — G4700 Insomnia, unspecified: Secondary | ICD-10-CM

## 2024-06-19 DIAGNOSIS — Z510 Encounter for antineoplastic radiation therapy: Secondary | ICD-10-CM | POA: Diagnosis not present

## 2024-06-19 LAB — RAD ONC ARIA SESSION SUMMARY
Course Elapsed Days: 28
Plan Fractions Treated to Date: 20
Plan Prescribed Dose Per Fraction: 1.8 Gy
Plan Total Fractions Prescribed: 25
Plan Total Prescribed Dose: 45 Gy
Reference Point Dosage Given to Date: 36 Gy
Reference Point Session Dosage Given: 1.8 Gy
Session Number: 20

## 2024-06-19 MED ORDER — ZOLPIDEM TARTRATE 5 MG PO TABS: 5.0000 mg | ORAL_TABLET | Freq: Every evening | ORAL | 0 refills | Status: DC | PRN

## 2024-06-20 ENCOUNTER — Encounter: Payer: Self-pay | Admitting: Hematology & Oncology

## 2024-06-20 ENCOUNTER — Ambulatory Visit
Admission: RE | Admit: 2024-06-20 | Discharge: 2024-06-20 | Disposition: A | Discharge status: Home / Self Care | Source: Ambulatory Visit | Attending: Radiation Oncology | Admitting: Radiation Oncology

## 2024-06-20 ENCOUNTER — Encounter: Payer: Self-pay | Admitting: Family

## 2024-06-20 ENCOUNTER — Other Ambulatory Visit: Payer: Self-pay

## 2024-06-20 DIAGNOSIS — C169 Malignant neoplasm of stomach, unspecified: Secondary | ICD-10-CM | POA: Diagnosis not present

## 2024-06-20 DIAGNOSIS — Z510 Encounter for antineoplastic radiation therapy: Secondary | ICD-10-CM | POA: Diagnosis not present

## 2024-06-20 DIAGNOSIS — C160 Malignant neoplasm of cardia: Secondary | ICD-10-CM | POA: Diagnosis not present

## 2024-06-20 LAB — RAD ONC ARIA SESSION SUMMARY
Course Elapsed Days: 29
Plan Fractions Treated to Date: 21
Plan Prescribed Dose Per Fraction: 1.8 Gy
Plan Total Fractions Prescribed: 25
Plan Total Prescribed Dose: 45 Gy
Reference Point Dosage Given to Date: 37.8 Gy
Reference Point Session Dosage Given: 1.8 Gy
Session Number: 21

## 2024-06-21 ENCOUNTER — Ambulatory Visit
Admission: RE | Admit: 2024-06-21 | Discharge: 2024-06-21 | Disposition: A | Discharge status: Home / Self Care | Source: Ambulatory Visit | Attending: Radiation Oncology | Admitting: Radiation Oncology

## 2024-06-21 ENCOUNTER — Other Ambulatory Visit: Payer: Self-pay

## 2024-06-21 DIAGNOSIS — C160 Malignant neoplasm of cardia: Secondary | ICD-10-CM | POA: Diagnosis not present

## 2024-06-21 DIAGNOSIS — Z510 Encounter for antineoplastic radiation therapy: Secondary | ICD-10-CM | POA: Diagnosis not present

## 2024-06-21 DIAGNOSIS — C169 Malignant neoplasm of stomach, unspecified: Secondary | ICD-10-CM | POA: Diagnosis not present

## 2024-06-21 LAB — RAD ONC ARIA SESSION SUMMARY
Course Elapsed Days: 30
Plan Fractions Treated to Date: 22
Plan Prescribed Dose Per Fraction: 1.8 Gy
Plan Total Fractions Prescribed: 25
Plan Total Prescribed Dose: 45 Gy
Reference Point Dosage Given to Date: 39.6 Gy
Reference Point Session Dosage Given: 1.8 Gy
Session Number: 22

## 2024-06-22 ENCOUNTER — Other Ambulatory Visit: Payer: Self-pay

## 2024-06-22 ENCOUNTER — Ambulatory Visit
Admission: RE | Admit: 2024-06-22 | Discharge: 2024-06-22 | Disposition: A | Discharge status: Home / Self Care | Source: Ambulatory Visit | Attending: Radiation Oncology | Admitting: Radiation Oncology

## 2024-06-22 DIAGNOSIS — C169 Malignant neoplasm of stomach, unspecified: Secondary | ICD-10-CM | POA: Diagnosis not present

## 2024-06-22 DIAGNOSIS — Z510 Encounter for antineoplastic radiation therapy: Secondary | ICD-10-CM | POA: Diagnosis not present

## 2024-06-22 DIAGNOSIS — C160 Malignant neoplasm of cardia: Secondary | ICD-10-CM | POA: Diagnosis not present

## 2024-06-22 LAB — RAD ONC ARIA SESSION SUMMARY
Course Elapsed Days: 31
Plan Fractions Treated to Date: 23
Plan Prescribed Dose Per Fraction: 1.8 Gy
Plan Total Fractions Prescribed: 25
Plan Total Prescribed Dose: 45 Gy
Reference Point Dosage Given to Date: 41.4 Gy
Reference Point Session Dosage Given: 1.8 Gy
Session Number: 23

## 2024-06-23 ENCOUNTER — Other Ambulatory Visit: Payer: Self-pay

## 2024-06-23 ENCOUNTER — Ambulatory Visit
Admission: RE | Admit: 2024-06-23 | Discharge: 2024-06-23 | Disposition: A | Discharge status: Home / Self Care | Source: Ambulatory Visit | Attending: Radiation Oncology | Admitting: Radiation Oncology

## 2024-06-23 DIAGNOSIS — C160 Malignant neoplasm of cardia: Secondary | ICD-10-CM | POA: Diagnosis not present

## 2024-06-23 DIAGNOSIS — Z510 Encounter for antineoplastic radiation therapy: Secondary | ICD-10-CM | POA: Diagnosis not present

## 2024-06-23 DIAGNOSIS — C169 Malignant neoplasm of stomach, unspecified: Secondary | ICD-10-CM | POA: Diagnosis not present

## 2024-06-23 LAB — RAD ONC ARIA SESSION SUMMARY
Course Elapsed Days: 32
Plan Fractions Treated to Date: 24
Plan Prescribed Dose Per Fraction: 1.8 Gy
Plan Total Fractions Prescribed: 25
Plan Total Prescribed Dose: 45 Gy
Reference Point Dosage Given to Date: 43.2 Gy
Reference Point Session Dosage Given: 1.8 Gy
Session Number: 24

## 2024-06-26 ENCOUNTER — Ambulatory Visit
Admission: RE | Admit: 2024-06-26 | Discharge: 2024-06-26 | Disposition: A | Source: Ambulatory Visit | Attending: Radiation Oncology | Admitting: Radiation Oncology

## 2024-06-26 ENCOUNTER — Other Ambulatory Visit: Payer: Self-pay

## 2024-06-26 DIAGNOSIS — Z510 Encounter for antineoplastic radiation therapy: Secondary | ICD-10-CM | POA: Diagnosis not present

## 2024-06-26 DIAGNOSIS — C160 Malignant neoplasm of cardia: Secondary | ICD-10-CM | POA: Diagnosis not present

## 2024-06-26 DIAGNOSIS — C169 Malignant neoplasm of stomach, unspecified: Secondary | ICD-10-CM | POA: Diagnosis not present

## 2024-06-26 LAB — RAD ONC ARIA SESSION SUMMARY
Course Elapsed Days: 35
Plan Fractions Treated to Date: 25
Plan Prescribed Dose Per Fraction: 1.8 Gy
Plan Total Fractions Prescribed: 25
Plan Total Prescribed Dose: 45 Gy
Reference Point Dosage Given to Date: 45 Gy
Reference Point Session Dosage Given: 1.8 Gy
Session Number: 25

## 2024-06-27 ENCOUNTER — Ambulatory Visit
Admission: RE | Admit: 2024-06-27 | Discharge: 2024-06-27 | Disposition: A | Source: Ambulatory Visit | Attending: Radiation Oncology | Admitting: Radiation Oncology

## 2024-06-27 ENCOUNTER — Other Ambulatory Visit: Payer: Self-pay

## 2024-06-27 DIAGNOSIS — Z510 Encounter for antineoplastic radiation therapy: Secondary | ICD-10-CM | POA: Diagnosis not present

## 2024-06-27 DIAGNOSIS — C169 Malignant neoplasm of stomach, unspecified: Secondary | ICD-10-CM | POA: Diagnosis not present

## 2024-06-27 DIAGNOSIS — C160 Malignant neoplasm of cardia: Secondary | ICD-10-CM | POA: Diagnosis not present

## 2024-06-27 LAB — RAD ONC ARIA SESSION SUMMARY
Course Elapsed Days: 36
Plan Fractions Treated to Date: 1
Plan Prescribed Dose Per Fraction: 1.8 Gy
Plan Total Fractions Prescribed: 3
Plan Total Prescribed Dose: 5.4 Gy
Reference Point Dosage Given to Date: 1.8 Gy
Reference Point Session Dosage Given: 1.8 Gy
Session Number: 26

## 2024-06-28 ENCOUNTER — Other Ambulatory Visit: Payer: Self-pay

## 2024-06-28 ENCOUNTER — Ambulatory Visit
Admission: RE | Admit: 2024-06-28 | Discharge: 2024-06-28 | Disposition: A | Discharge status: Home / Self Care | Source: Ambulatory Visit | Attending: Radiation Oncology | Admitting: Radiation Oncology

## 2024-06-28 DIAGNOSIS — C160 Malignant neoplasm of cardia: Secondary | ICD-10-CM | POA: Insufficient documentation

## 2024-06-28 DIAGNOSIS — C169 Malignant neoplasm of stomach, unspecified: Secondary | ICD-10-CM | POA: Diagnosis not present

## 2024-06-28 DIAGNOSIS — Z510 Encounter for antineoplastic radiation therapy: Secondary | ICD-10-CM | POA: Insufficient documentation

## 2024-06-28 LAB — RAD ONC ARIA SESSION SUMMARY
Course Elapsed Days: 37
Plan Fractions Treated to Date: 2
Plan Prescribed Dose Per Fraction: 1.8 Gy
Plan Total Fractions Prescribed: 3
Plan Total Prescribed Dose: 5.4 Gy
Reference Point Dosage Given to Date: 3.6 Gy
Reference Point Session Dosage Given: 1.8 Gy
Session Number: 27

## 2024-06-29 ENCOUNTER — Other Ambulatory Visit: Payer: Self-pay

## 2024-06-29 ENCOUNTER — Ambulatory Visit
Admission: RE | Admit: 2024-06-29 | Discharge: 2024-06-29 | Disposition: A | Discharge status: Home / Self Care | Source: Ambulatory Visit | Attending: Radiation Oncology | Admitting: Radiation Oncology

## 2024-06-29 DIAGNOSIS — C160 Malignant neoplasm of cardia: Secondary | ICD-10-CM | POA: Diagnosis not present

## 2024-06-29 DIAGNOSIS — C169 Malignant neoplasm of stomach, unspecified: Secondary | ICD-10-CM | POA: Diagnosis not present

## 2024-06-29 LAB — RAD ONC ARIA SESSION SUMMARY
Course Elapsed Days: 38
Plan Fractions Treated to Date: 3
Plan Prescribed Dose Per Fraction: 1.8 Gy
Plan Total Fractions Prescribed: 3
Plan Total Prescribed Dose: 5.4 Gy
Reference Point Dosage Given to Date: 5.4 Gy
Reference Point Session Dosage Given: 1.8 Gy
Session Number: 28

## 2024-06-30 NOTE — Radiation Completion Notes (Addendum)
  Radiation Oncology         [phone] ________________________________  Name: Matthew Lee MRN: [id number]  Date of Service: 06/29/2024  DOB: May 23, 1956  End of Treatment Note  Diagnosis: Stage III (cT3, N1, M0) gastric adenocarcinoma  Intent: Curative     ==========DELIVERED PLANS==========  First Treatment Date: 2024-05-22 Last Treatment Date: 2024-06-29   Plan Name: Stomach Site: Stomach Technique: IMRT Mode: Photon Dose Per Fraction: 1.8 Gy Prescribed Dose (Delivered / Prescribed): 45 Gy / 45 Gy Prescribed Fxs (Delivered / Prescribed): 25 / 25   Plan Name: Stomach_Bst:1 Site: Stomach Technique: IMRT Mode: Photon Dose Per Fraction: 1.8 Gy Prescribed Dose (Delivered / Prescribed): 5.4 Gy / 5.4 Gy Prescribed Fxs (Delivered / Prescribed): 3 / 3     ====================================  The patient tolerated radiation. He developed mild fatigue and a lot of nausea. He was able to consume solid foods and work his usual schedule throughout the treatment.   The patient will return in one month and will continue follow up with Dr. Timmy as well.      Ronita Due, PA-C

## 2024-07-03 ENCOUNTER — Encounter: Payer: Self-pay | Admitting: Hematology & Oncology

## 2024-07-03 ENCOUNTER — Encounter: Payer: Self-pay | Admitting: *Deleted

## 2024-07-03 DIAGNOSIS — D509 Iron deficiency anemia, unspecified: Secondary | ICD-10-CM

## 2024-07-03 DIAGNOSIS — C160 Malignant neoplasm of cardia: Secondary | ICD-10-CM

## 2024-07-03 DIAGNOSIS — C169 Malignant neoplasm of stomach, unspecified: Secondary | ICD-10-CM

## 2024-07-03 NOTE — Addendum Note (Signed)
 Addended by: Aspen Lawrance E on: 07/03/2024 03:27 PM   Modules accepted: Orders

## 2024-07-03 NOTE — Progress Notes (Signed)
 See MyChart communication dated 07/03/2024.  Message sent to scheduling and Resa Prost RD requesting reach out.   Oncology Nurse Navigator Documentation     07/03/2024   12:30 PM  Oncology Nurse Navigator Flowsheets  Navigator Follow Up Date: 07/28/2024  Navigator Follow Up Reason: Follow-up Appointment  Navigator Location [facility]  Navigator Encounter Type MyChart  Patient Visit Type MedOnc  Treatment Phase Active Tx  Barriers/Navigation Needs Coordination of Care  Education Other  Interventions Coordination of Care;Education  Acuity Level 1-No Barriers  Coordination of Care Appts  Education Method Written  Support Groups/Services Friends and Family  Time Spent with Patient 15

## 2024-07-04 ENCOUNTER — Other Ambulatory Visit: Payer: Self-pay

## 2024-07-04 DIAGNOSIS — C160 Malignant neoplasm of cardia: Secondary | ICD-10-CM

## 2024-07-05 ENCOUNTER — Inpatient Hospital Stay

## 2024-07-05 ENCOUNTER — Inpatient Hospital Stay: Attending: Hematology & Oncology

## 2024-07-05 VITALS — BP 117/71 | HR 78 | Temp 98.6°F | Resp 20 | Wt 148.0 lb

## 2024-07-05 DIAGNOSIS — D509 Iron deficiency anemia, unspecified: Secondary | ICD-10-CM | POA: Diagnosis not present

## 2024-07-05 DIAGNOSIS — C160 Malignant neoplasm of cardia: Secondary | ICD-10-CM | POA: Diagnosis not present

## 2024-07-05 DIAGNOSIS — Z79899 Other long term (current) drug therapy: Secondary | ICD-10-CM | POA: Diagnosis not present

## 2024-07-05 DIAGNOSIS — C169 Malignant neoplasm of stomach, unspecified: Secondary | ICD-10-CM

## 2024-07-05 DIAGNOSIS — Z9221 Personal history of antineoplastic chemotherapy: Secondary | ICD-10-CM | POA: Diagnosis not present

## 2024-07-05 DIAGNOSIS — R1310 Dysphagia, unspecified: Secondary | ICD-10-CM | POA: Diagnosis not present

## 2024-07-05 DIAGNOSIS — Z923 Personal history of irradiation: Secondary | ICD-10-CM | POA: Insufficient documentation

## 2024-07-05 LAB — CBC WITH DIFFERENTIAL (CANCER CENTER ONLY)
Abs Immature Granulocytes: 0.01 K/uL (ref 0.00–0.07)
Basophils Absolute: 0 K/uL (ref 0.0–0.1)
Basophils Relative: 0 %
Eosinophils Absolute: 0.1 K/uL (ref 0.0–0.5)
Eosinophils Relative: 2 %
HCT: 42.7 % (ref 39.0–52.0)
Hemoglobin: 14.6 g/dL (ref 13.0–17.0)
Immature Granulocytes: 0 %
Lymphocytes Relative: 5 %
Lymphs Abs: 0.4 K/uL — ABNORMAL LOW (ref 0.7–4.0)
MCH: 28.9 pg (ref 26.0–34.0)
MCHC: 34.2 g/dL (ref 30.0–36.0)
MCV: 84.6 fL (ref 80.0–100.0)
Monocytes Absolute: 1.7 K/uL — ABNORMAL HIGH (ref 0.1–1.0)
Monocytes Relative: 26 %
Neutro Abs: 4.4 K/uL (ref 1.7–7.7)
Neutrophils Relative %: 67 %
Platelet Count: 130 K/uL — ABNORMAL LOW (ref 150–400)
RBC: 5.05 MIL/uL (ref 4.22–5.81)
RDW: 17.2 % — ABNORMAL HIGH (ref 11.5–15.5)
WBC Count: 6.7 K/uL (ref 4.0–10.5)
nRBC: 0 % (ref 0.0–0.2)

## 2024-07-05 LAB — CMP (CANCER CENTER ONLY)
ALT: 28 U/L (ref 0–44)
AST: 29 U/L (ref 15–41)
Albumin: 4.1 g/dL (ref 3.5–5.0)
Alkaline Phosphatase: 95 U/L (ref 38–126)
Anion gap: 11 (ref 5–15)
BUN: 28 mg/dL — ABNORMAL HIGH (ref 8–23)
CO2: 23 mmol/L (ref 22–32)
Calcium: 9.2 mg/dL (ref 8.9–10.3)
Chloride: 100 mmol/L (ref 98–111)
Creatinine: 0.85 mg/dL (ref 0.61–1.24)
GFR, Estimated: >60 mL/min (ref >60)
Glucose, Bld: 112 mg/dL — ABNORMAL HIGH (ref 70–99)
Potassium: 4.8 mmol/L (ref 3.5–5.1)
Sodium: 135 mmol/L (ref 135–145)
Total Bilirubin: 0.6 mg/dL (ref 0.0–1.2)
Total Protein: 6.9 g/dL (ref 6.5–8.1)

## 2024-07-05 MED ORDER — SODIUM CHLORIDE 0.9 % IV SOLN: INTRAVENOUS | Status: DC

## 2024-07-05 NOTE — Patient Instructions (Signed)

## 2024-07-09 ENCOUNTER — Other Ambulatory Visit: Payer: Self-pay

## 2024-07-09 ENCOUNTER — Emergency Department (HOSPITAL_BASED_OUTPATIENT_CLINIC_OR_DEPARTMENT_OTHER)
Admission: EM | Admit: 2024-07-09 | Discharge: 2024-07-09 | Disposition: A | Discharge status: Home / Self Care | Attending: Emergency Medicine | Admitting: Emergency Medicine

## 2024-07-09 ENCOUNTER — Emergency Department (HOSPITAL_BASED_OUTPATIENT_CLINIC_OR_DEPARTMENT_OTHER)

## 2024-07-09 ENCOUNTER — Encounter (HOSPITAL_BASED_OUTPATIENT_CLINIC_OR_DEPARTMENT_OTHER): Payer: Self-pay

## 2024-07-09 ENCOUNTER — Encounter: Payer: Self-pay | Admitting: Family

## 2024-07-09 DIAGNOSIS — I2510 Atherosclerotic heart disease of native coronary artery without angina pectoris: Secondary | ICD-10-CM | POA: Diagnosis not present

## 2024-07-09 DIAGNOSIS — N281 Cyst of kidney, acquired: Secondary | ICD-10-CM | POA: Diagnosis not present

## 2024-07-09 DIAGNOSIS — R002 Palpitations: Secondary | ICD-10-CM

## 2024-07-09 DIAGNOSIS — I509 Heart failure, unspecified: Secondary | ICD-10-CM | POA: Diagnosis not present

## 2024-07-09 DIAGNOSIS — R0602 Shortness of breath: Secondary | ICD-10-CM | POA: Diagnosis not present

## 2024-07-09 DIAGNOSIS — Z8616 Personal history of COVID-19: Secondary | ICD-10-CM | POA: Diagnosis not present

## 2024-07-09 DIAGNOSIS — R198 Other specified symptoms and signs involving the digestive system and abdomen: Secondary | ICD-10-CM | POA: Diagnosis not present

## 2024-07-09 DIAGNOSIS — Z87891 Personal history of nicotine dependence: Secondary | ICD-10-CM | POA: Insufficient documentation

## 2024-07-09 DIAGNOSIS — Z79899 Other long term (current) drug therapy: Secondary | ICD-10-CM | POA: Diagnosis not present

## 2024-07-09 DIAGNOSIS — I110 Hypertensive heart disease with heart failure: Secondary | ICD-10-CM | POA: Diagnosis not present

## 2024-07-09 DIAGNOSIS — I493 Ventricular premature depolarization: Secondary | ICD-10-CM | POA: Diagnosis not present

## 2024-07-09 DIAGNOSIS — K5730 Diverticulosis of large intestine without perforation or abscess without bleeding: Secondary | ICD-10-CM | POA: Diagnosis not present

## 2024-07-09 DIAGNOSIS — I959 Hypotension, unspecified: Secondary | ICD-10-CM | POA: Diagnosis not present

## 2024-07-09 DIAGNOSIS — R918 Other nonspecific abnormal finding of lung field: Secondary | ICD-10-CM | POA: Diagnosis not present

## 2024-07-09 LAB — COMPREHENSIVE METABOLIC PANEL WITH GFR
ALT: 30 U/L (ref 0–44)
AST: 35 U/L (ref 15–41)
Albumin: 4.1 g/dL (ref 3.5–5.0)
Alkaline Phosphatase: 111 U/L (ref 38–126)
Anion gap: 12 (ref 5–15)
BUN: 18 mg/dL (ref 8–23)
CO2: 23 mmol/L (ref 22–32)
Calcium: 9.2 mg/dL (ref 8.9–10.3)
Chloride: 101 mmol/L (ref 98–111)
Creatinine, Ser: 0.86 mg/dL (ref 0.61–1.24)
GFR, Estimated: >60 mL/min (ref >60)
Glucose, Bld: 132 mg/dL — ABNORMAL HIGH (ref 70–99)
Potassium: 4.4 mmol/L (ref 3.5–5.1)
Sodium: 135 mmol/L (ref 135–145)
Total Bilirubin: 0.7 mg/dL (ref 0.0–1.2)
Total Protein: 6.8 g/dL (ref 6.5–8.1)

## 2024-07-09 LAB — CBC WITH DIFFERENTIAL/PLATELET
Abs Immature Granulocytes: 0.02 K/uL (ref 0.00–0.07)
Basophils Absolute: 0 K/uL (ref 0.0–0.1)
Basophils Relative: 1 %
Eosinophils Absolute: 0.1 K/uL (ref 0.0–0.5)
Eosinophils Relative: 1 %
HCT: 43.6 % (ref 39.0–52.0)
Hemoglobin: 14.9 g/dL (ref 13.0–17.0)
Immature Granulocytes: 0 %
Lymphocytes Relative: 10 %
Lymphs Abs: 0.6 K/uL — ABNORMAL LOW (ref 0.7–4.0)
MCH: 28.6 pg (ref 26.0–34.0)
MCHC: 34.2 g/dL (ref 30.0–36.0)
MCV: 83.7 fL (ref 80.0–100.0)
Monocytes Absolute: 1.2 K/uL — ABNORMAL HIGH (ref 0.1–1.0)
Monocytes Relative: 21 %
Neutro Abs: 4 K/uL (ref 1.7–7.7)
Neutrophils Relative %: 67 %
Platelets: 118 K/uL — ABNORMAL LOW (ref 150–400)
RBC: 5.21 MIL/uL (ref 4.22–5.81)
RDW: 17.2 % — ABNORMAL HIGH (ref 11.5–15.5)
WBC: 5.9 K/uL (ref 4.0–10.5)
nRBC: 0 % (ref 0.0–0.2)

## 2024-07-09 LAB — PRO BRAIN NATRIURETIC PEPTIDE: Pro Brain Natriuretic Peptide: 247 pg/mL (ref <300.0)

## 2024-07-09 LAB — D-DIMER, QUANTITATIVE: D-Dimer, Quant: 1 ug{FEU}/mL — ABNORMAL HIGH (ref 0.00–0.50)

## 2024-07-09 LAB — RESP PANEL BY RT-PCR (RSV, FLU A&B, COVID)  RVPGX2
Influenza A by PCR: NEGATIVE
Influenza B by PCR: NEGATIVE
Resp Syncytial Virus by PCR: NEGATIVE
SARS Coronavirus 2 by RT PCR: NEGATIVE

## 2024-07-09 LAB — TROPONIN T, HIGH SENSITIVITY
Troponin T High Sensitivity: <15 ng/L (ref 0–19)
Troponin T High Sensitivity: <15 ng/L (ref 0–19)

## 2024-07-09 MED ORDER — SODIUM CHLORIDE 0.9 % IV BOLUS: 500.0000 mL | Freq: Once | INTRAVENOUS | Status: DC

## 2024-07-09 MED ORDER — HEPARIN SOD (PORK) LOCK FLUSH 100 UNIT/ML IV SOLN
500.0000 [IU] | Freq: Once | INTRAVENOUS | Status: AC
  Administered 2024-07-09: 500 [IU]
  Filled 2024-07-09: qty 5

## 2024-07-09 MED ORDER — IOHEXOL 350 MG/ML SOLN
100.0000 mL | Freq: Once | INTRAVENOUS | Status: AC | PRN
Start: 2024-07-09 — End: 2024-07-09
  Administered 2024-07-09: 75 mL via INTRAVENOUS

## 2024-07-09 MED ORDER — SODIUM CHLORIDE 0.9 % IV BOLUS
500.0000 mL | Freq: Once | INTRAVENOUS | Status: AC
  Administered 2024-07-09: 500 mL via INTRAVENOUS

## 2024-07-09 NOTE — Discharge Instructions (Signed)
 Make an appointment to have close follow-up with your oncologist.  You should also make an appointment to follow-up with your cardiologist regarding your palpitations.  Return to the emergency room if you have any worsening symptoms.

## 2024-07-09 NOTE — ED Notes (Signed)
 Pt ambulated with a pulse ox SpO2: 94-97 HR: 94-97

## 2024-07-09 NOTE — ED Provider Notes (Signed)
 [facility] Provider Note   CSN: [id number] Arrival date & time: 07/09/24  9277     Patient presents with: Palpitations   Matthew Lee is a 68 y.o. male.   Patient is a 68 year old male with a history of hypertension, hyperlipidemia, thoracic aortic aneurysm, prior subdural hematoma status postevacuation, CHF, adenocarcinoma of the stomach status post chemotherapy this past spring and recently finished radiation treatments earlier this month.  He presents with palpitations and shortness of breath.  He feels like over the last 2 to 3 days he is holding onto too much fluid.  He is having some congestion in his lungs and feeling more short of breath.  He denies any leg swelling.  He is having some palpitations and feeling like his heart is skipping beats.  No fevers.  No runny nose or congestion.  He feels like his abdomen is a little full as well but he thinks it is from his lungs of any congestion.  He denies any abdominal pain.  No vomiting or diarrhea.  He does feel lightheaded even sitting on the bed.  His wife reports that he has been a little bit more pale and clammy recently.       Prior to Admission medications   Medication Sig Start Date End Date Taking? Authorizing Provider  amLODipine  (NORVASC ) 10 MG tablet TAKE 1 TABLET DAILY 12/15/23   O'Sullivan, Melissa, NP  atorvastatin  (LIPITOR) 10 MG tablet Take 1 tablet (10 mg total) by mouth daily. 12/23/23 05/26/24  Revankar, Jennifer SAUNDERS, MD  capecitabine  (XELODA ) 500 MG tablet Take 4 tablets (2,000 mg total) by mouth daily after breakfast. Take within 30 minutes after meal. Take only on days of radiation, Monday thru Friday. NONE on the weekend 06/08/24   Timmy Maude SAUNDERS, MD  labetalol  (NORMODYNE ) 100 MG tablet Take 1.5 tablets (150 mg total) by mouth 2 (two) times daily. 05/24/24   O'Sullivan, Melissa, NP  sucralfate  (CARAFATE ) 1 g tablet Take 1 tablet (1 g total) by mouth 4 (four) times daily -   with meals and at bedtime. Crush and dissolve in 10 mL's of warm water, take 30 minutes prior to meals 06/06/24   Shannon Agent, MD  zolpidem  (AMBIEN ) 5 MG tablet Take 1 tablet (5 mg total) by mouth at bedtime as needed for sleep. 06/19/24   O'Sullivan, Melissa, NP    Allergies: Patient has no known allergies.    Review of Systems  Constitutional:  Positive for diaphoresis and fatigue. Negative for chills and fever.  HENT:  Negative for congestion, rhinorrhea and sneezing.   Eyes: Negative.   Respiratory:  Positive for cough and shortness of breath. Negative for chest tightness.   Cardiovascular:  Positive for palpitations. Negative for chest pain and leg swelling.  Gastrointestinal:  Negative for abdominal pain, diarrhea, nausea and vomiting.  Genitourinary:  Negative for difficulty urinating, flank pain and frequency.  Musculoskeletal:  Negative for arthralgias and back pain.  Skin:  Negative for Wisher.  Neurological:  Positive for light-headedness. Negative for speech difficulty, weakness, numbness and headaches.    Updated Vital Signs BP (!) 143/99 (BP Location: Right Arm)   Pulse 84   Temp 98.1 F (36.7 C) (Oral)   Resp 17   Ht 5' 7 (1.702 m)   Wt 67.1 kg   SpO2 96%   BMI 23.18 kg/m   Physical Exam Constitutional:      Appearance: He is well-developed.  HENT:  Head: Normocephalic and atraumatic.  Eyes:     Pupils: Pupils are equal, round, and reactive to light.  Cardiovascular:     Rate and Rhythm: Normal rate and regular rhythm.     Heart sounds: Normal heart sounds.  Pulmonary:     Effort: Pulmonary effort is normal. No respiratory distress.     Breath sounds: Rales present. No wheezing.  Chest:     Chest wall: No tenderness.  Abdominal:     General: Bowel sounds are normal.     Palpations: Abdomen is soft.     Tenderness: There is no abdominal tenderness. There is no guarding or rebound.  Musculoskeletal:        General: Normal range of motion.     Cervical  back: Normal range of motion and neck supple.     Comments: No edema or calf tenderness  Lymphadenopathy:     Cervical: No cervical adenopathy.  Skin:    General: Skin is warm and dry.     Findings: No [place].  Neurological:     Mental Status: He is alert and oriented to person, place, and time.     (all labs ordered are listed, but only abnormal results are displayed) Labs Reviewed  COMPREHENSIVE METABOLIC PANEL WITH GFR - Abnormal; Notable for the following components:      Result Value   Glucose, Bld 132 (*)    All other components within normal limits  CBC WITH DIFFERENTIAL/PLATELET - Abnormal; Notable for the following components:   RDW 17.2 (*)    Platelets 118 (*)    Lymphs Abs 0.6 (*)    Monocytes Absolute 1.2 (*)    All other components within normal limits  D-DIMER, QUANTITATIVE - Abnormal; Notable for the following components:   D-Dimer, Quant 1.00 (*)    All other components within normal limits  RESP PANEL BY RT-PCR (RSV, FLU A&B, COVID)  [id number]  PRO BRAIN NATRIURETIC PEPTIDE  TROPONIN T, HIGH SENSITIVITY  TROPONIN T, HIGH SENSITIVITY    EKG: None  Radiology: CT Angio Chest PE W/Cm &/Or Wo Cm Result Date: 07/09/2024 EXAM: CTA CHEST PE WITH AND WITHOUT IV CONTRAST CT ABDOMEN AND PELVIS WITH AND WITHOUT IV CONTRAST 07/09/2024 10:45:00 AM TECHNIQUE: CTA of the chest was performed after the administration of 75mL (iohexol  (OMNIPAQUE ) 350 MG/ML injection 100 mL IOHEXOL  350 MG/ML SOLN) of intravenous contrast. Multiplanar reformatted images are provided for review. MIP images are provided for review. CT of the abdomen and pelvis was performed with and without the administration of intravenous contrast. Automated exposure control, iterative reconstruction, and/or weight based adjustment of the mA/kV was utilized to reduce the radiation dose to as low as reasonably achievable. COMPARISON: PET CT 04/20/2024 CLINICAL HISTORY: Pulmonary embolism (PE) suspected, low to  intermediate probability, positive D-dimer. Patient complains of heart palpitations, hypotension, shortness of breath worsening over the past few days. Patient states feels like his fluid is building up. Patient also complains of epigastric/upper abdominal pain. Last radiation 10/2. Last chemo in July. FINDINGS: CHEST: PULMONARY ARTERIES: Pulmonary arteries are adequately opacified for evaluation. No intraluminal filling defect to suggest pulmonary embolism. Main pulmonary artery is normal in caliber. MEDIASTINUM: No mediastinal lymphadenopathy. The heart and pericardium demonstrate no acute abnormality. Aortic atherosclerotic calcification and coronary artery calcifications are noted. There is no acute abnormality of the thoracic aorta. LUNGS AND PLEURA: The lungs demonstrate emphysema with diffuse bronchial wall thickening. Asymmetric scarring versus subsegmental atelectasis with mild ground-glass attenuation is noted within the left base,  likely reflecting hypoinflation. No consolidative change. Calcified granuloma is noted in the left base. No pleural effusion or pneumothorax. SOFT TISSUES AND BONES: No acute bone or soft tissue abnormality. Thoracic spondylosis noted. ABDOMEN AND PELVIS: LIVER: The liver is unremarkable. GALLBLADDER AND BILE DUCTS: Gallbladder is unremarkable. No biliary ductal dilatation. SPLEEN: Spleen demonstrates no acute abnormality. PANCREAS: Pancreas is normal in size and contour without focal lesion or ductal dilatation. ADRENAL GLANDS: Unchanged diffuse thickening of the adrenal glands is noted, this is stable when compared with prior imaging, likely reflecting underlying adenomatous hyperplasia. KIDNEYS, URETERS AND BLADDER: Bilateral bosniak class 1 and class 2 kidney cysts are noted. The largest cyst arises off the upper pole of the right kidney, measuring 2 cm. According to consensus criteria, no specific follow-up imaging is recommended. No stones in the kidneys or ureters. No  hydronephrosis. No perinephric or periureteral stranding. Urinary bladder is unremarkable. GI AND BOWEL: Mild wall thickening of the proximal stomach just below the GE junction is noted, this patient has a history of known gastric malignancy. The appearance is unchanged compared with recent PET/CT where mild residual hypermetabolism was identified. There is no pathologic dilatation of the large or small bowel loops. The appendix is visualized and normal in caliber, without wall thickening, periappendiceal inflammation, or fluid. Moderate stool burden is noted within the ascending and transverse colon. The sigmoid colon demonstrates diverticulosis without evidence of diverticulitis. No bowel wall thickening, pericolonic stranding, abscess, or free air is seen. REPRODUCTIVE: Prostate gland appears normal. PERITONEUM AND RETROPERITONEUM: Mild venous congestion is noted within the central mesentery. No free fluid or fluid collections. No signs of pneumoperitoneum. Small fat-containing umbilical hernia. LYMPH NODES: No enlarged abdominal or pelvic lymph nodes. BONES AND SOFT TISSUES: No acute or suspicious osseous abnormality. IMPRESSION: 1. No evidence of pulmonary embolism. 2. No acute findings within the abdomen or pelvis 3. Unchanged proximal gastric wall thickening at the GE junction consistent with known gastric malignancy; no measurable mass lesion identified. 4. Aortic atherosclerotic calcification and coronary artery calcification. Electronically signed by: Waddell Calk MD 07/09/2024 11:22 AM EDT RP Workstation: [id number]   CT ABDOMEN PELVIS W CONTRAST Result Date: 07/09/2024 EXAM: CTA CHEST PE WITH AND WITHOUT IV CONTRAST CT ABDOMEN AND PELVIS WITH AND WITHOUT IV CONTRAST 07/09/2024 10:45:00 AM TECHNIQUE: CTA of the chest was performed after the administration of 75mL (iohexol  (OMNIPAQUE ) 350 MG/ML injection 100 mL IOHEXOL  350 MG/ML SOLN) of intravenous contrast. Multiplanar reformatted images are  provided for review. MIP images are provided for review. CT of the abdomen and pelvis was performed with and without the administration of intravenous contrast. Automated exposure control, iterative reconstruction, and/or weight based adjustment of the mA/kV was utilized to reduce the radiation dose to as low as reasonably achievable. COMPARISON: PET CT 04/20/2024 CLINICAL HISTORY: Pulmonary embolism (PE) suspected, low to intermediate probability, positive D-dimer. Patient complains of heart palpitations, hypotension, shortness of breath worsening over the past few days. Patient states feels like his fluid is building up. Patient also complains of epigastric/upper abdominal pain. Last radiation 10/2. Last chemo in July. FINDINGS: CHEST: PULMONARY ARTERIES: Pulmonary arteries are adequately opacified for evaluation. No intraluminal filling defect to suggest pulmonary embolism. Main pulmonary artery is normal in caliber. MEDIASTINUM: No mediastinal lymphadenopathy. The heart and pericardium demonstrate no acute abnormality. Aortic atherosclerotic calcification and coronary artery calcifications are noted. There is no acute abnormality of the thoracic aorta. LUNGS AND PLEURA: The lungs demonstrate emphysema with diffuse bronchial wall thickening. Asymmetric scarring versus subsegmental atelectasis  with mild ground-glass attenuation is noted within the left base, likely reflecting hypoinflation. No consolidative change. Calcified granuloma is noted in the left base. No pleural effusion or pneumothorax. SOFT TISSUES AND BONES: No acute bone or soft tissue abnormality. Thoracic spondylosis noted. ABDOMEN AND PELVIS: LIVER: The liver is unremarkable. GALLBLADDER AND BILE DUCTS: Gallbladder is unremarkable. No biliary ductal dilatation. SPLEEN: Spleen demonstrates no acute abnormality. PANCREAS: Pancreas is normal in size and contour without focal lesion or ductal dilatation. ADRENAL GLANDS: Unchanged diffuse thickening of  the adrenal glands is noted, this is stable when compared with prior imaging, likely reflecting underlying adenomatous hyperplasia. KIDNEYS, URETERS AND BLADDER: Bilateral bosniak class 1 and class 2 kidney cysts are noted. The largest cyst arises off the upper pole of the right kidney, measuring 2 cm. According to consensus criteria, no specific follow-up imaging is recommended. No stones in the kidneys or ureters. No hydronephrosis. No perinephric or periureteral stranding. Urinary bladder is unremarkable. GI AND BOWEL: Mild wall thickening of the proximal stomach just below the GE junction is noted, this patient has a history of known gastric malignancy. The appearance is unchanged compared with recent PET/CT where mild residual hypermetabolism was identified. There is no pathologic dilatation of the large or small bowel loops. The appendix is visualized and normal in caliber, without wall thickening, periappendiceal inflammation, or fluid. Moderate stool burden is noted within the ascending and transverse colon. The sigmoid colon demonstrates diverticulosis without evidence of diverticulitis. No bowel wall thickening, pericolonic stranding, abscess, or free air is seen. REPRODUCTIVE: Prostate gland appears normal. PERITONEUM AND RETROPERITONEUM: Mild venous congestion is noted within the central mesentery. No free fluid or fluid collections. No signs of pneumoperitoneum. Small fat-containing umbilical hernia. LYMPH NODES: No enlarged abdominal or pelvic lymph nodes. BONES AND SOFT TISSUES: No acute or suspicious osseous abnormality. IMPRESSION: 1. No evidence of pulmonary embolism. 2. No acute findings within the abdomen or pelvis 3. Unchanged proximal gastric wall thickening at the GE junction consistent with known gastric malignancy; no measurable mass lesion identified. 4. Aortic atherosclerotic calcification and coronary artery calcification. Electronically signed by: Waddell Calk MD 07/09/2024 11:22 AM EDT  RP Workstation: [id number]   DG Chest 2 View Result Date: 07/09/2024 EXAM: 2 VIEW(S) XRAY OF THE CHEST 07/09/2024 08:20:00 AM COMPARISON: None available. CLINICAL HISTORY: SOB. C/o heart palpitations, hypotension, shortness of breath worsening over the past few days. States feels like his fluid is building up. Hx - GE junction adenocarcinoma ; ; Last radiation 10/2. Last chemo in July. FINDINGS: LINES, TUBES AND DEVICES: Right IJ port catheter extends to mid SVC. LUNGS AND PLEURA: Hyperinflated lungs with attenuated bronchovascular markings. Linear scarring or subsegmental atelectasis at left lung base. No pulmonary edema. No pleural effusion. No pneumothorax. HEART AND MEDIASTINUM: Aortic arch calcifications. No acute abnormality of the cardiac and mediastinal silhouettes. BONES AND SOFT TISSUES: Anterior vertebral endplate spurring at multiple levels in mid and lower thoracic spine. IMPRESSION: 1. Hyperinflated lungs with linear scarring or subsegmental atelectasis at the left lung base. 2. Right internal jugular port catheter with tip in the mid SVC. Electronically signed by: Waddell Calk MD 07/09/2024 08:30 AM EDT RP Workstation: [id number]     Procedures   Medications Ordered in the ED  sodium chloride  0.9 % bolus 500 mL (0 mLs Intravenous Stopped 07/09/24 1055)  iohexol  (OMNIPAQUE ) 350 MG/ML injection 100 mL (75 mLs Intravenous Contrast Given 07/09/24 1032)  heparin  lock flush 100 unit/mL (500 Units Intracatheter Given 07/09/24 1213)  Medical Decision Making Amount and/or Complexity of Data Reviewed Labs: ordered. Radiology: ordered.  Risk Prescription drug management.   This patient presents to the ED for concern of shortness of breath, palpitations, this involves an extensive number of treatment options, and is a complaint that carries with it a high risk of complications and morbidity.  I considered the following differential and admission for  this acute, potentially life threatening condition.  The differential diagnosis includes pulmonary edema, ACS, arrhythmia, anemia, dehydration, electrolyte abnormality, PE, pneumonia  MDM:    Patient is a 68 year old male who presents with shortness of breath and palpitations as well as some dizziness.  His lungs have some few crackles.  He is not hypoxic.  No increased work of breathing.  His EKG does not show any acute ischemic changes although he is having some frequent PVCs.  His troponins are negative.  Chest x-ray does not show any pneumonia or evidence of pulmonary edema.  His BNP is normal.  Given his shortness of breath, D-dimer was checked which was positive.  He also was mostly complaining of some fullness in his upper abdomen and discomfort in this area.  CT a of the chest was performed and I included abdomen pelvis to better assess this area.  There is no evidence of PE.  No other lung changes were noted.  CT of abdomen does not show any evidence of significant ascites or other acute abnormality.  His abdomen is nontender on exam.  He is concerned about the fullness feeling in his abdomen.  Question whether this is related to his radiation treatments.  However his workup here has been largely reassuring.  He was given a small bolus of IV fluids given his dizziness.  He is able to ambulate without significant symptoms.  His oxygen saturation remained normal.  His heart rate remained in the 90s.  He was discharged home in good condition.  Was encouraged to have close follow-up with his oncologist.  He also was encouraged to follow-up with his cardiologist regarding the palpitations.  Return precautions were given.  (Labs, imaging, consults)  Labs: I Ordered, and personally interpreted labs.  The pertinent results include: Normal troponins, normal BNP, mild thrombocytopenia  Imaging Studies ordered: I ordered imaging studies including chest x-ray, CT chest, CT abdomen pelvis I independently  visualized and interpreted imaging. I agree with the radiologist interpretation  Additional history obtained from wife.  External records from outside source obtained and reviewed including history  Cardiac Monitoring: The patient was maintained on a cardiac monitor.  If on the cardiac monitor, I personally viewed and interpreted the cardiac monitored which showed an underlying rhythm of: Sinus rhythm with PVCs  Reevaluation: After the interventions noted above, I reevaluated the patient and found that they have :improved  Social Determinants of Health:    Disposition: Discharged to home  Co morbidities that complicate the patient evaluation  Past Medical History:  Diagnosis Date   Abnormal weight loss 11/02/2022   Aneurysm of thoracic aorta 09/2022   dilated to 4 cm on CT   Cholelithiasis 11/02/2022   Colon cancer screening 07/31/2022   Coronary artery calcification 10/08/2022   Diverticulitis    Dyspnea on exertion 11/11/2022   Elevated PSA 07/31/2022   Essential hypertension 09/26/2009   Qualifier: Diagnosis of   By: Janit MD, Ozell Sevin 11/11/2022   Gall bladder polyp 02/18/2015   Overview:   - 6 mm (as described from US  abd in 2015).  Repeat Us  abd lim ordered.   Gastric adenocarcinoma (HCC) 12/2023   GERD (gastroesophageal reflux disease) 09/07/2013   Hepatitis C 08/24/2012   TX and cured per pt   History of COVID-19 07/31/2022   History of hepatitis C 11/11/2022   History of tobacco abuse    Hyperglycemia 11/22/2014   Hypertension    long standing since 2007, was treated for sometime, but then discontinued taking meds on his own.   Immunization counseling 04/01/2022   Insomnia 10/31/2009   Qualifier: Diagnosis of   By: Arvella MD, Amanjot       Mild diastolic dysfunction 08/22/2012   Mixed dyslipidemia 11/11/2022   Preventative health care 12/31/2014   Prominent abdominal aortic pulsation 11/11/2022   Pulmonary edema, acute (HCC)    hospitalized in  09/2009 with hypertensive crisis and acute pulmonary edema- improved with lasix  nad labetalol .     Medicines Meds ordered this encounter  Medications   DISCONTD: sodium chloride  0.9 % bolus 500 mL   sodium chloride  0.9 % bolus 500 mL   iohexol  (OMNIPAQUE ) 350 MG/ML injection 100 mL   heparin  lock flush 100 unit/mL    I have reviewed the patients home medicines and have made adjustments as needed  Problem List / ED Course: Problem List Items Addressed This Visit   None Visit Diagnoses       Palpitations    -  Primary     PVC's (premature ventricular contractions)       Relevant Medications   heparin  lock flush 100 unit/mL (Completed)     Abdominal fullness         Shortness of breath                    Final diagnoses:  Palpitations  PVC's (premature ventricular contractions)  Abdominal fullness  Shortness of breath    ED Discharge Orders     None          Lenor Hollering, MD 07/09/24 1224

## 2024-07-09 NOTE — ED Triage Notes (Signed)
 C/o heart palpitations, hypotension, shortness of breath worsening over the past few days. States feels like his fluid is building up.  Last radiation 10/2. Last chemo in July.

## 2024-07-19 ENCOUNTER — Telehealth: Payer: Self-pay | Admitting: Dietician

## 2024-07-19 ENCOUNTER — Inpatient Hospital Stay: Admitting: Dietician

## 2024-07-19 NOTE — Telephone Encounter (Signed)
 Attempted to reach patient for a scheduled remote nutrition consult. Provided my cell# in a text, (his voice mail was full) to return call for his follow up nutrition consult.  Micheline Craven, RDN, LDN Registered Dietitian, [facility] Part Time Remote (Usual office hours: Tuesday-Thursday) Cell: [phone]

## 2024-07-28 ENCOUNTER — Inpatient Hospital Stay

## 2024-07-28 ENCOUNTER — Inpatient Hospital Stay (HOSPITAL_BASED_OUTPATIENT_CLINIC_OR_DEPARTMENT_OTHER): Admitting: Hematology & Oncology

## 2024-07-28 ENCOUNTER — Encounter: Payer: Self-pay | Admitting: Hematology & Oncology

## 2024-07-28 ENCOUNTER — Encounter: Payer: Self-pay | Admitting: *Deleted

## 2024-07-28 ENCOUNTER — Encounter: Payer: Self-pay | Admitting: Radiation Oncology

## 2024-07-28 VITALS — BP 127/78 | HR 80 | Temp 97.7°F | Resp 16 | Wt 150.0 lb

## 2024-07-28 DIAGNOSIS — R1310 Dysphagia, unspecified: Secondary | ICD-10-CM | POA: Diagnosis not present

## 2024-07-28 DIAGNOSIS — Z923 Personal history of irradiation: Secondary | ICD-10-CM | POA: Diagnosis not present

## 2024-07-28 DIAGNOSIS — C160 Malignant neoplasm of cardia: Secondary | ICD-10-CM | POA: Diagnosis not present

## 2024-07-28 DIAGNOSIS — Z9221 Personal history of antineoplastic chemotherapy: Secondary | ICD-10-CM | POA: Diagnosis not present

## 2024-07-28 DIAGNOSIS — C169 Malignant neoplasm of stomach, unspecified: Secondary | ICD-10-CM

## 2024-07-28 DIAGNOSIS — Z79899 Other long term (current) drug therapy: Secondary | ICD-10-CM | POA: Diagnosis not present

## 2024-07-28 DIAGNOSIS — D509 Iron deficiency anemia, unspecified: Secondary | ICD-10-CM | POA: Diagnosis not present

## 2024-07-28 LAB — CBC WITH DIFFERENTIAL (CANCER CENTER ONLY)
Abs Immature Granulocytes: 0.01 K/uL (ref 0.00–0.07)
Basophils Absolute: 0 K/uL (ref 0.0–0.1)
Basophils Relative: 1 %
Eosinophils Absolute: 0.2 K/uL (ref 0.0–0.5)
Eosinophils Relative: 3 %
HCT: 41.5 % (ref 39.0–52.0)
Hemoglobin: 13.8 g/dL (ref 13.0–17.0)
Immature Granulocytes: 0 %
Lymphocytes Relative: 29 %
Lymphs Abs: 1.9 K/uL (ref 0.7–4.0)
MCH: 28.6 pg (ref 26.0–34.0)
MCHC: 33.3 g/dL (ref 30.0–36.0)
MCV: 85.9 fL (ref 80.0–100.0)
Monocytes Absolute: 0.9 K/uL (ref 0.1–1.0)
Monocytes Relative: 14 %
Neutro Abs: 3.6 K/uL (ref 1.7–7.7)
Neutrophils Relative %: 53 %
Platelet Count: 200 K/uL (ref 150–400)
RBC: 4.83 MIL/uL (ref 4.22–5.81)
RDW: 15.9 % — ABNORMAL HIGH (ref 11.5–15.5)
WBC Count: 6.6 K/uL (ref 4.0–10.5)
nRBC: 0 % (ref 0.0–0.2)

## 2024-07-28 LAB — IRON AND IRON BINDING CAPACITY (CC-WL,HP ONLY)
Iron: 133 ug/dL (ref 45–182)
Saturation Ratios: 31 % (ref 17.9–39.5)
TIBC: 427 ug/dL (ref 250–450)
UIBC: 294 ug/dL

## 2024-07-28 LAB — CMP (CANCER CENTER ONLY)
ALT: 26 U/L (ref 0–44)
AST: 30 U/L (ref 15–41)
Albumin: 4.1 g/dL (ref 3.5–5.0)
Alkaline Phosphatase: 113 U/L (ref 38–126)
Anion gap: 11 (ref 5–15)
BUN: 16 mg/dL (ref 8–23)
CO2: 23 mmol/L (ref 22–32)
Calcium: 9.3 mg/dL (ref 8.9–10.3)
Chloride: 101 mmol/L (ref 98–111)
Creatinine: 0.65 mg/dL (ref 0.61–1.24)
GFR, Estimated: >60 mL/min (ref >60)
Glucose, Bld: 110 mg/dL — ABNORMAL HIGH (ref 70–99)
Potassium: 3.9 mmol/L (ref 3.5–5.1)
Sodium: 135 mmol/L (ref 135–145)
Total Bilirubin: 0.9 mg/dL (ref 0.0–1.2)
Total Protein: 6.9 g/dL (ref 6.5–8.1)

## 2024-07-28 LAB — FERRITIN: Ferritin: 203 ng/mL (ref 24–336)

## 2024-07-28 LAB — LACTATE DEHYDROGENASE: LDH: 172 U/L (ref 98–192)

## 2024-07-28 NOTE — Progress Notes (Signed)
 Hematology and Oncology Follow Up Visit  Matthew Lee [id number] [birth year] 68 y.o. 07/28/2024   Principle Diagnosis:  Locally advanced adenocarcinoma of the stomach Iron deficiency anemia-GI bleed  Current Therapy:   Neoadjuvant FLOT-s/p cycle #2 - start on 02/17/2024 Feraheme given on 02/17/2024 XRT + Xeloda  (2000 mg po q day) -completed on 06/29/2024     Interim History:  Matthew Lee is back for follow-up.  Completed all of his radiation therapy treatments.  He did well with this.  He is starting to feel better now.  He started to have decreased odynophagia.  He started to have a bitter taste for food.  Today is his last day at work.  He retires after today.  He is so lucky to be able to retire.  He has had no problems with nausea or vomiting.  He has had no problems with pain.  He said no change in bowel or bladder habits.  He has had no bleeding.  He has had no rashes.  He has had no cough or shortness of breath.  Overall, I would have to say that his performance status is probably ECOG 1.     Medications:  Current Outpatient Medications:    amLODipine  (NORVASC ) 10 MG tablet, TAKE 1 TABLET DAILY, Disp: 90 tablet, Rfl: 3   atorvastatin  (LIPITOR) 10 MG tablet, Take 1 tablet (10 mg total) by mouth daily., Disp: 90 tablet, Rfl: 3   docusate sodium  (COLACE) 50 MG capsule, Take 50 mg by mouth daily as needed for mild constipation., Disp: , Rfl:    labetalol  (NORMODYNE ) 100 MG tablet, Take 1.5 tablets (150 mg total) by mouth 2 (two) times daily., Disp: 270 tablet, Rfl: 0   ondansetron  (ZOFRAN ) 8 MG tablet, Take 8 mg by mouth 3 (three) times daily., Disp: , Rfl:    sennosides-docusate sodium  (SENOKOT-S) 8.6-50 MG tablet, Take 1 tablet by mouth daily as needed for constipation., Disp: , Rfl:    zolpidem  (AMBIEN ) 5 MG tablet, Take 1 tablet (5 mg total) by mouth at bedtime as needed for sleep. (Patient not taking: Reported on 07/28/2024), Disp: 15 tablet, Rfl: 0  Allergies: No Known  Allergies  Past Medical History, Surgical history, Social history, and Family History were reviewed and updated.  Review of Systems: Review of Systems  Constitutional: Negative.   HENT:  Negative.    Eyes: Negative.   Respiratory: Negative.    Cardiovascular: Negative.   Gastrointestinal: Negative.   Endocrine: Negative.   Genitourinary: Negative.    Musculoskeletal: Negative.   Skin: Negative.   Neurological: Negative.   Hematological: Negative.   Psychiatric/Behavioral: Negative.      Physical Exam: Vital signs show temperature of 97.7.  Pulse 80.  Blood pressure 127/78.  Weight is 150 pounds.    Wt Readings from Last 3 Encounters:  07/28/24 150 lb (68 kg)  07/09/24 148 lb (67.1 kg)  07/05/24 148 lb (67.1 kg)    Physical Exam Vitals reviewed.  HENT:     Head: Normocephalic and atraumatic.  Eyes:     Pupils: Pupils are equal, round, and reactive to light.  Cardiovascular:     Rate and Rhythm: Normal rate and regular rhythm.     Heart sounds: Normal heart sounds.  Pulmonary:     Effort: Pulmonary effort is normal.     Breath sounds: Normal breath sounds.  Abdominal:     General: Bowel sounds are normal.     Palpations: Abdomen is soft.  Musculoskeletal:  General: No tenderness or deformity. Normal range of motion.     Cervical back: Normal range of motion.  Lymphadenopathy:     Cervical: No cervical adenopathy.  Skin:    General: Skin is warm and dry.     Findings: No erythema or Rita.  Neurological:     Mental Status: He is alert and oriented to person, place, and time.  Psychiatric:        Behavior: Behavior normal.        Thought Content: Thought content normal.        Judgment: Judgment normal.      Lab Results  Component Value Date   WBC 6.6 07/28/2024   HGB 13.8 07/28/2024   HCT 41.5 07/28/2024   MCV 85.9 07/28/2024   PLT 200 07/28/2024     Chemistry      Component Value Date/Time   NA 135 07/28/2024 0825   K 3.9 07/28/2024 0825    CL 101 07/28/2024 0825   CO2 23 07/28/2024 0825   BUN 16 07/28/2024 0825   CREATININE 0.65 07/28/2024 0825   CREATININE 0.85 01/21/2018 1500      Component Value Date/Time   CALCIUM  9.3 07/28/2024 0825   ALKPHOS 113 07/28/2024 0825   AST 30 07/28/2024 0825   ALT 26 07/28/2024 0825   BILITOT 0.9 07/28/2024 0825      Impression and Plan: Matthew Lee is a very nice 68 year old white male.  He has hopefully localized adenocarcinoma of the stomach.  He has had previous chemotherapy with FLOT.  He did have a response.  He did not wish to have surgery.  He then completed radiation therapy and Xeloda .  He really hated taking the Xeloda .  Now, we will plan for follow-up upper endoscopy and PET scan probably in early December.  Hopefully, we will see that he has had a complete response.  Think that in his case, we might want to consider maintenance immunotherapy.  Again, he does not want to have surgery.  We will try to get him back in December after he has his upper endoscopy and his PET scan.    Maude JONELLE Crease, MD 10/31/20251:48 PM

## 2024-07-28 NOTE — Progress Notes (Signed)
 Patient has completed his concurrent chemo radiation. He will now need post treatment PET and endoscopy. Message sent to Dr Pamula nurse for follow up. PET scheduled for 08/31/2024.  Oncology Nurse Navigator Documentation     07/28/2024   11:45 AM  Oncology Nurse Navigator Flowsheets  Navigator Follow Up Date: 08/31/2024  Navigator Follow Up Reason: Scan Review  Navigator Location [facility]  Navigator Encounter Type Appt/Treatment Plan Review  Patient Visit Type MedOnc  Treatment Phase Active Tx  Barriers/Navigation Needs Coordination of Care  Interventions Coordination of Care  Acuity Level 1-No Barriers  Coordination of Care Other  Support Groups/Services Friends and Family  Time Spent with Patient 15

## 2024-07-29 NOTE — Progress Notes (Shared)
 Radiation Oncology         [phone] ________________________________  Name: Matthew Lee MRN: [id number]  Date: 07/31/2024  DOB: 1956-05-30  Follow-Up Visit Note  CC: Daryl Setter, NP  Daryl Setter, NP  No diagnosis found.  Diagnosis: The encounter diagnosis was Gastric adenocarcinoma (HCC).   Locally advanced gastric adenocarcinoma: s/p neoadjuvant chemotherapy and adjuvant radiation therapy with xeloda    Interval Since Last Radiation: 1 month and 1 day   Intent: Curative  Radiation Treatment Dates: First Treatment Date: 2024-05-22 -- Last Treatment Date: 2024-06-29 Site/Dose/Technique/Mode:  Plan Name: Stomach Site: Stomach Technique: IMRT Mode: Photon Dose Per Fraction: 1.8 Gy Prescribed Dose (Delivered / Prescribed): 45 Gy / 45 Gy Prescribed Fxs (Delivered / Prescribed): 25 / 25   Plan Name: Stomach_Bst:1 Site: Stomach Technique: IMRT Mode: Photon Dose Per Fraction: 1.8 Gy Prescribed Dose (Delivered / Prescribed): 5.4 Gy / 5.4 Gy Prescribed Fxs (Delivered / Prescribed): 3 / 3  Narrative:  The patient returns today for routine follow-up. He tolerated radiation therapy relatively well overall other than mild fatigue and a lot of nausea. He was able to consume solid foods and work his usual schedule throughout his treatment course.   To review, he also received xeloda  along with radiation therapy and has continued to follow with Dr. Timmy in this setting. He tolerated xeloda  relatively well without any significant side effects. Dr. Timmy would like him to return for a follow-up upper endoscopy and restaging PET scan in early December.   He also recently presented to the ED on 07/09/24 with c/o palpitations and shortness of breath. He also reported feeling as though he was retaining fluid. Initial work-up was notable for positive D-dimer which prompted a CTA of the chest to be performed which thankfully showed no acute cardiopulmonary findings. A CT AP  was also performed at that time which showed no acute findings (negative for PE) and the known unchanged proximal gastric wall thickening at the GE junction consistent with known gastric malignancy (and without measurable masses or lesions identified in the area). Given his reassuring work-up overall, he was discharged home without further intervention required other than a small bolus of IV fluids (due to c/o dizziness).   ***                            Allergies:  has no known allergies.  Meds: Current Outpatient Medications  Medication Sig Dispense Refill   amLODipine  (NORVASC ) 10 MG tablet TAKE 1 TABLET DAILY 90 tablet 3   atorvastatin  (LIPITOR) 10 MG tablet Take 1 tablet (10 mg total) by mouth daily. 90 tablet 3   docusate sodium  (COLACE) 50 MG capsule Take 50 mg by mouth daily as needed for mild constipation.     labetalol  (NORMODYNE ) 100 MG tablet Take 1.5 tablets (150 mg total) by mouth 2 (two) times daily. 270 tablet 0   ondansetron  (ZOFRAN ) 8 MG tablet Take 8 mg by mouth 3 (three) times daily.     sennosides-docusate sodium  (SENOKOT-S) 8.6-50 MG tablet Take 1 tablet by mouth daily as needed for constipation.     zolpidem  (AMBIEN ) 5 MG tablet Take 1 tablet (5 mg total) by mouth at bedtime as needed for sleep. (Patient not taking: Reported on 07/28/2024) 15 tablet 0   No current facility-administered medications for this encounter.    Physical Findings: The patient is in no acute distress. Patient is alert and oriented.  vitals were not taken  for this visit. .  No significant changes. Lungs are clear to auscultation bilaterally. Heart has regular rate and rhythm. No palpable cervical, supraclavicular, or axillary adenopathy. Abdomen soft, non-tender, normal bowel sounds.   Lab Findings: Lab Results  Component Value Date   WBC 6.6 07/28/2024   HGB 13.8 07/28/2024   HCT 41.5 07/28/2024   MCV 85.9 07/28/2024   PLT 200 07/28/2024    Radiographic Findings: CT Angio Chest PE  W/Cm &/Or Wo Cm Result Date: 07/09/2024 EXAM: CTA CHEST PE WITH AND WITHOUT IV CONTRAST CT ABDOMEN AND PELVIS WITH AND WITHOUT IV CONTRAST 07/09/2024 10:45:00 AM TECHNIQUE: CTA of the chest was performed after the administration of 75mL (iohexol  (OMNIPAQUE ) 350 MG/ML injection 100 mL IOHEXOL  350 MG/ML SOLN) of intravenous contrast. Multiplanar reformatted images are provided for review. MIP images are provided for review. CT of the abdomen and pelvis was performed with and without the administration of intravenous contrast. Automated exposure control, iterative reconstruction, and/or weight based adjustment of the mA/kV was utilized to reduce the radiation dose to as low as reasonably achievable. COMPARISON: PET CT 04/20/2024 CLINICAL HISTORY: Pulmonary embolism (PE) suspected, low to intermediate probability, positive D-dimer. Patient complains of heart palpitations, hypotension, shortness of breath worsening over the past few days. Patient states feels like his fluid is building up. Patient also complains of epigastric/upper abdominal pain. Last radiation 10/2. Last chemo in July. FINDINGS: CHEST: PULMONARY ARTERIES: Pulmonary arteries are adequately opacified for evaluation. No intraluminal filling defect to suggest pulmonary embolism. Main pulmonary artery is normal in caliber. MEDIASTINUM: No mediastinal lymphadenopathy. The heart and pericardium demonstrate no acute abnormality. Aortic atherosclerotic calcification and coronary artery calcifications are noted. There is no acute abnormality of the thoracic aorta. LUNGS AND PLEURA: The lungs demonstrate emphysema with diffuse bronchial wall thickening. Asymmetric scarring versus subsegmental atelectasis with mild ground-glass attenuation is noted within the left base, likely reflecting hypoinflation. No consolidative change. Calcified granuloma is noted in the left base. No pleural effusion or pneumothorax. SOFT TISSUES AND BONES: No acute bone or soft tissue  abnormality. Thoracic spondylosis noted. ABDOMEN AND PELVIS: LIVER: The liver is unremarkable. GALLBLADDER AND BILE DUCTS: Gallbladder is unremarkable. No biliary ductal dilatation. SPLEEN: Spleen demonstrates no acute abnormality. PANCREAS: Pancreas is normal in size and contour without focal lesion or ductal dilatation. ADRENAL GLANDS: Unchanged diffuse thickening of the adrenal glands is noted, this is stable when compared with prior imaging, likely reflecting underlying adenomatous hyperplasia. KIDNEYS, URETERS AND BLADDER: Bilateral bosniak class 1 and class 2 kidney cysts are noted. The largest cyst arises off the upper pole of the right kidney, measuring 2 cm. According to consensus criteria, no specific follow-up imaging is recommended. No stones in the kidneys or ureters. No hydronephrosis. No perinephric or periureteral stranding. Urinary bladder is unremarkable. GI AND BOWEL: Mild wall thickening of the proximal stomach just below the GE junction is noted, this patient has a history of known gastric malignancy. The appearance is unchanged compared with recent PET/CT where mild residual hypermetabolism was identified. There is no pathologic dilatation of the large or small bowel loops. The appendix is visualized and normal in caliber, without wall thickening, periappendiceal inflammation, or fluid. Moderate stool burden is noted within the ascending and transverse colon. The sigmoid colon demonstrates diverticulosis without evidence of diverticulitis. No bowel wall thickening, pericolonic stranding, abscess, or free air is seen. REPRODUCTIVE: Prostate gland appears normal. PERITONEUM AND RETROPERITONEUM: Mild venous congestion is noted within the central mesentery. No free fluid or fluid collections.  No signs of pneumoperitoneum. Small fat-containing umbilical hernia. LYMPH NODES: No enlarged abdominal or pelvic lymph nodes. BONES AND SOFT TISSUES: No acute or suspicious osseous abnormality. IMPRESSION: 1.  No evidence of pulmonary embolism. 2. No acute findings within the abdomen or pelvis 3. Unchanged proximal gastric wall thickening at the GE junction consistent with known gastric malignancy; no measurable mass lesion identified. 4. Aortic atherosclerotic calcification and coronary artery calcification. Electronically signed by: Waddell Calk MD 07/09/2024 11:22 AM EDT RP Workstation: [id number]   CT ABDOMEN PELVIS W CONTRAST Result Date: 07/09/2024 EXAM: CTA CHEST PE WITH AND WITHOUT IV CONTRAST CT ABDOMEN AND PELVIS WITH AND WITHOUT IV CONTRAST 07/09/2024 10:45:00 AM TECHNIQUE: CTA of the chest was performed after the administration of 75mL (iohexol  (OMNIPAQUE ) 350 MG/ML injection 100 mL IOHEXOL  350 MG/ML SOLN) of intravenous contrast. Multiplanar reformatted images are provided for review. MIP images are provided for review. CT of the abdomen and pelvis was performed with and without the administration of intravenous contrast. Automated exposure control, iterative reconstruction, and/or weight based adjustment of the mA/kV was utilized to reduce the radiation dose to as low as reasonably achievable. COMPARISON: PET CT 04/20/2024 CLINICAL HISTORY: Pulmonary embolism (PE) suspected, low to intermediate probability, positive D-dimer. Patient complains of heart palpitations, hypotension, shortness of breath worsening over the past few days. Patient states feels like his fluid is building up. Patient also complains of epigastric/upper abdominal pain. Last radiation 10/2. Last chemo in July. FINDINGS: CHEST: PULMONARY ARTERIES: Pulmonary arteries are adequately opacified for evaluation. No intraluminal filling defect to suggest pulmonary embolism. Main pulmonary artery is normal in caliber. MEDIASTINUM: No mediastinal lymphadenopathy. The heart and pericardium demonstrate no acute abnormality. Aortic atherosclerotic calcification and coronary artery calcifications are noted. There is no acute abnormality of the  thoracic aorta. LUNGS AND PLEURA: The lungs demonstrate emphysema with diffuse bronchial wall thickening. Asymmetric scarring versus subsegmental atelectasis with mild ground-glass attenuation is noted within the left base, likely reflecting hypoinflation. No consolidative change. Calcified granuloma is noted in the left base. No pleural effusion or pneumothorax. SOFT TISSUES AND BONES: No acute bone or soft tissue abnormality. Thoracic spondylosis noted. ABDOMEN AND PELVIS: LIVER: The liver is unremarkable. GALLBLADDER AND BILE DUCTS: Gallbladder is unremarkable. No biliary ductal dilatation. SPLEEN: Spleen demonstrates no acute abnormality. PANCREAS: Pancreas is normal in size and contour without focal lesion or ductal dilatation. ADRENAL GLANDS: Unchanged diffuse thickening of the adrenal glands is noted, this is stable when compared with prior imaging, likely reflecting underlying adenomatous hyperplasia. KIDNEYS, URETERS AND BLADDER: Bilateral bosniak class 1 and class 2 kidney cysts are noted. The largest cyst arises off the upper pole of the right kidney, measuring 2 cm. According to consensus criteria, no specific follow-up imaging is recommended. No stones in the kidneys or ureters. No hydronephrosis. No perinephric or periureteral stranding. Urinary bladder is unremarkable. GI AND BOWEL: Mild wall thickening of the proximal stomach just below the GE junction is noted, this patient has a history of known gastric malignancy. The appearance is unchanged compared with recent PET/CT where mild residual hypermetabolism was identified. There is no pathologic dilatation of the large or small bowel loops. The appendix is visualized and normal in caliber, without wall thickening, periappendiceal inflammation, or fluid. Moderate stool burden is noted within the ascending and transverse colon. The sigmoid colon demonstrates diverticulosis without evidence of diverticulitis. No bowel wall thickening, pericolonic  stranding, abscess, or free air is seen. REPRODUCTIVE: Prostate gland appears normal. PERITONEUM AND RETROPERITONEUM: Mild venous congestion is  noted within the central mesentery. No free fluid or fluid collections. No signs of pneumoperitoneum. Small fat-containing umbilical hernia. LYMPH NODES: No enlarged abdominal or pelvic lymph nodes. BONES AND SOFT TISSUES: No acute or suspicious osseous abnormality. IMPRESSION: 1. No evidence of pulmonary embolism. 2. No acute findings within the abdomen or pelvis 3. Unchanged proximal gastric wall thickening at the GE junction consistent with known gastric malignancy; no measurable mass lesion identified. 4. Aortic atherosclerotic calcification and coronary artery calcification. Electronically signed by: Waddell Calk MD 07/09/2024 11:22 AM EDT RP Workstation: [id number]   DG Chest 2 View Result Date: 07/09/2024 EXAM: 2 VIEW(S) XRAY OF THE CHEST 07/09/2024 08:20:00 AM COMPARISON: None available. CLINICAL HISTORY: SOB. C/o heart palpitations, hypotension, shortness of breath worsening over the past few days. States feels like his fluid is building up. Hx - GE junction adenocarcinoma ; ; Last radiation 10/2. Last chemo in July. FINDINGS: LINES, TUBES AND DEVICES: Right IJ port catheter extends to mid SVC. LUNGS AND PLEURA: Hyperinflated lungs with attenuated bronchovascular markings. Linear scarring or subsegmental atelectasis at left lung base. No pulmonary edema. No pleural effusion. No pneumothorax. HEART AND MEDIASTINUM: Aortic arch calcifications. No acute abnormality of the cardiac and mediastinal silhouettes. BONES AND SOFT TISSUES: Anterior vertebral endplate spurring at multiple levels in mid and lower thoracic spine. IMPRESSION: 1. Hyperinflated lungs with linear scarring or subsegmental atelectasis at the left lung base. 2. Right internal jugular port catheter with tip in the mid SVC. Electronically signed by: Waddell Calk MD 07/09/2024 08:30 AM EDT RP  Workstation: [id number]    Impression: Locally advanced gastric adenocarcinoma: s/p neoadjuvant chemotherapy and adjuvant radiation therapy with xeloda    The patient is recovering from the effects of radiation.  ***  Plan:  ***   *** minutes of total time was spent for this patient encounter, including preparation, face-to-face counseling with the patient and coordination of care, physical exam, and documentation of the encounter. ____________________________________  Lynwood CHARM Nasuti, PhD, MD  This document serves as a record of services personally performed by Lynwood Nasuti, MD. It was created on his behalf by Dorthy Fuse, a trained medical scribe. The creation of this record is based on the scribe's personal observations and the provider's statements to them. This document has been checked and approved by the attending provider.

## 2024-07-31 ENCOUNTER — Ambulatory Visit
Admission: RE | Admit: 2024-07-31 | Discharge: 2024-07-31 | Disposition: A | Discharge status: Home / Self Care | Source: Ambulatory Visit | Attending: Radiation Oncology | Admitting: Radiation Oncology

## 2024-07-31 ENCOUNTER — Telehealth: Payer: Self-pay

## 2024-07-31 HISTORY — DX: Personal history of irradiation: Z92.3

## 2024-07-31 NOTE — Progress Notes (Incomplete)
 Matthew Lee is here today for follow up post radiation to the abdomen  They completed their radiation on: 06/29/24   Does the patient complain of any of the following:  Pain:*** Abdominal bloating: *** Diarrhea/Constipation: *** Nausea/Vomiting: ***  Urinary Issues (dysuria/incomplete emptying/ incontinence/ increased frequency/urgency): *** Post radiation skin changes: ***   Additional comments if applicable:

## 2024-07-31 NOTE — Telephone Encounter (Signed)
 Pt scheduled for telephone previsit 08/14/24 at 1pm, EGD in the [facility] scheduled for 12/5@2pm . Pt aware of both appts.

## 2024-07-31 NOTE — Telephone Encounter (Signed)
-----   Message from Gordy HERO Pyrtle sent at 07/31/2024  9:24 AM EST ----- Can we look for an EGD for this patient the week after Thanksgiving.  If I do not having a slot perhaps anothre LBGI MD could do it for me. Thanks JMP ----- Message ----- From: Timmy Maude SAUNDERS, MD Sent: 07/28/2024   1:56 PM EST To: Gordy HERO Starch, MD  Gordy: I need to have another upper endoscopy done.  He finished his treatments about a month ago.  I need to him have another endoscopy done the week after Thanksgiving if you can.  I may just see if he has any residual malignancy.  Thank so much for all of your help.  Jeralyn

## 2024-08-02 ENCOUNTER — Inpatient Hospital Stay: Attending: Hematology & Oncology | Admitting: Dietician

## 2024-08-02 NOTE — Progress Notes (Signed)
 Radiation Oncology         [phone] ________________________________  Name: Matthew Lee MRN: [id number]  Date: 08/03/2024  DOB: 1956/08/18  Follow-Up Visit Note  CC: Daryl Setter, NP  Daryl Setter, NP  No diagnosis found.  Diagnosis:  Stage III (cT3, N1, M0) gastric adenocarcinoma   Interval Since Last Radiation:  1 month 4 days Intent: curative  First Treatment Date: 2024-05-22 Last Treatment Date: 2024-06-29   Plan Name: Stomach Site: Stomach Technique: IMRT Mode: Photon Dose Per Fraction: 1.8 Gy Prescribed Dose (Delivered / Prescribed): 45 Gy / 45 Gy Prescribed Fxs (Delivered / Prescribed): 25 / 25   Plan Name: Stomach_Bst:1 Site: Stomach Technique: IMRT Mode: Photon Dose Per Fraction: 1.8 Gy Prescribed Dose (Delivered / Prescribed): 5.4 Gy / 5.4 Gy Prescribed Fxs (Delivered / Prescribed): 3 / 3   Narrative:  The patient returns today for routine follow-up. He was last seen in office on 05/08/24 for a consultation visit. Since then, patient completed his radiation treatment which he  tolerated quite well. Patient did however endorse experiencing mild fatigue, odynophagia and nausea.   In the interval since she was last seen, he  presented for a follow up visit with Dr. Timmy on 05/26/24 during which he reported feeling well overall and tolerating radiation and Xeloda  well. He returned for a follow up on 06/16/24 where he reported doing well and states that Carafate  has greatly improved chemo toxicities.    He was seen in the ED on 07/09/24 for palpitations and shortness of breath. CT of abdomen does not show any evidence of significant ascites or other acute abnormality. Chest x-ray does not show any pneumonia or evidence of pulmonary edema and EKG was normal. He was treated with IV fluids and discharged.   No other significant oncologic interval history since the patient was last seen.                                 Allergies:  has no known  allergies.  Meds: Current Outpatient Medications  Medication Sig Dispense Refill   amLODipine  (NORVASC ) 10 MG tablet TAKE 1 TABLET DAILY 90 tablet 3   atorvastatin  (LIPITOR) 10 MG tablet Take 1 tablet (10 mg total) by mouth daily. 90 tablet 3   docusate sodium  (COLACE) 50 MG capsule Take 50 mg by mouth daily as needed for mild constipation.     labetalol  (NORMODYNE ) 100 MG tablet Take 1.5 tablets (150 mg total) by mouth 2 (two) times daily. 270 tablet 0   ondansetron  (ZOFRAN ) 8 MG tablet Take 8 mg by mouth 3 (three) times daily.     sennosides-docusate sodium  (SENOKOT-S) 8.6-50 MG tablet Take 1 tablet by mouth daily as needed for constipation.     zolpidem  (AMBIEN ) 5 MG tablet Take 1 tablet (5 mg total) by mouth at bedtime as needed for sleep. (Patient not taking: Reported on 07/28/2024) 15 tablet 0   No current facility-administered medications for this visit.    Physical Findings: The patient is in no acute distress. Patient is alert and oriented.  vitals were not taken for this visit. .  No significant changes. Lungs are clear to auscultation bilaterally. Heart has regular rate and rhythm. No palpable cervical, supraclavicular, or axillary adenopathy. Abdomen soft, non-tender, normal bowel sounds.   Lab Findings: Lab Results  Component Value Date   WBC 6.6 07/28/2024   HGB 13.8 07/28/2024   HCT 41.5 07/28/2024  MCV 85.9 07/28/2024   PLT 200 07/28/2024    Radiographic Findings: CT Angio Chest PE W/Cm &/Or Wo Cm Result Date: 07/09/2024 EXAM: CTA CHEST PE WITH AND WITHOUT IV CONTRAST CT ABDOMEN AND PELVIS WITH AND WITHOUT IV CONTRAST 07/09/2024 10:45:00 AM TECHNIQUE: CTA of the chest was performed after the administration of 75mL (iohexol  (OMNIPAQUE ) 350 MG/ML injection 100 mL IOHEXOL  350 MG/ML SOLN) of intravenous contrast. Multiplanar reformatted images are provided for review. MIP images are provided for review. CT of the abdomen and pelvis was performed with and without the  administration of intravenous contrast. Automated exposure control, iterative reconstruction, and/or weight based adjustment of the mA/kV was utilized to reduce the radiation dose to as low as reasonably achievable. COMPARISON: PET CT 04/20/2024 CLINICAL HISTORY: Pulmonary embolism (PE) suspected, low to intermediate probability, positive D-dimer. Patient complains of heart palpitations, hypotension, shortness of breath worsening over the past few days. Patient states feels like his fluid is building up. Patient also complains of epigastric/upper abdominal pain. Last radiation 10/2. Last chemo in July. FINDINGS: CHEST: PULMONARY ARTERIES: Pulmonary arteries are adequately opacified for evaluation. No intraluminal filling defect to suggest pulmonary embolism. Main pulmonary artery is normal in caliber. MEDIASTINUM: No mediastinal lymphadenopathy. The heart and pericardium demonstrate no acute abnormality. Aortic atherosclerotic calcification and coronary artery calcifications are noted. There is no acute abnormality of the thoracic aorta. LUNGS AND PLEURA: The lungs demonstrate emphysema with diffuse bronchial wall thickening. Asymmetric scarring versus subsegmental atelectasis with mild ground-glass attenuation is noted within the left base, likely reflecting hypoinflation. No consolidative change. Calcified granuloma is noted in the left base. No pleural effusion or pneumothorax. SOFT TISSUES AND BONES: No acute bone or soft tissue abnormality. Thoracic spondylosis noted. ABDOMEN AND PELVIS: LIVER: The liver is unremarkable. GALLBLADDER AND BILE DUCTS: Gallbladder is unremarkable. No biliary ductal dilatation. SPLEEN: Spleen demonstrates no acute abnormality. PANCREAS: Pancreas is normal in size and contour without focal lesion or ductal dilatation. ADRENAL GLANDS: Unchanged diffuse thickening of the adrenal glands is noted, this is stable when compared with prior imaging, likely reflecting underlying adenomatous  hyperplasia. KIDNEYS, URETERS AND BLADDER: Bilateral bosniak class 1 and class 2 kidney cysts are noted. The largest cyst arises off the upper pole of the right kidney, measuring 2 cm. According to consensus criteria, no specific follow-up imaging is recommended. No stones in the kidneys or ureters. No hydronephrosis. No perinephric or periureteral stranding. Urinary bladder is unremarkable. GI AND BOWEL: Mild wall thickening of the proximal stomach just below the GE junction is noted, this patient has a history of known gastric malignancy. The appearance is unchanged compared with recent PET/CT where mild residual hypermetabolism was identified. There is no pathologic dilatation of the large or small bowel loops. The appendix is visualized and normal in caliber, without wall thickening, periappendiceal inflammation, or fluid. Moderate stool burden is noted within the ascending and transverse colon. The sigmoid colon demonstrates diverticulosis without evidence of diverticulitis. No bowel wall thickening, pericolonic stranding, abscess, or free air is seen. REPRODUCTIVE: Prostate gland appears normal. PERITONEUM AND RETROPERITONEUM: Mild venous congestion is noted within the central mesentery. No free fluid or fluid collections. No signs of pneumoperitoneum. Small fat-containing umbilical hernia. LYMPH NODES: No enlarged abdominal or pelvic lymph nodes. BONES AND SOFT TISSUES: No acute or suspicious osseous abnormality. IMPRESSION: 1. No evidence of pulmonary embolism. 2. No acute findings within the abdomen or pelvis 3. Unchanged proximal gastric wall thickening at the GE junction consistent with known gastric malignancy; no measurable  mass lesion identified. 4. Aortic atherosclerotic calcification and coronary artery calcification. Electronically signed by: Waddell Calk MD 07/09/2024 11:22 AM EDT RP Workstation: [id number]   CT ABDOMEN PELVIS W CONTRAST Result Date: 07/09/2024 EXAM: CTA CHEST PE WITH AND  WITHOUT IV CONTRAST CT ABDOMEN AND PELVIS WITH AND WITHOUT IV CONTRAST 07/09/2024 10:45:00 AM TECHNIQUE: CTA of the chest was performed after the administration of 75mL (iohexol  (OMNIPAQUE ) 350 MG/ML injection 100 mL IOHEXOL  350 MG/ML SOLN) of intravenous contrast. Multiplanar reformatted images are provided for review. MIP images are provided for review. CT of the abdomen and pelvis was performed with and without the administration of intravenous contrast. Automated exposure control, iterative reconstruction, and/or weight based adjustment of the mA/kV was utilized to reduce the radiation dose to as low as reasonably achievable. COMPARISON: PET CT 04/20/2024 CLINICAL HISTORY: Pulmonary embolism (PE) suspected, low to intermediate probability, positive D-dimer. Patient complains of heart palpitations, hypotension, shortness of breath worsening over the past few days. Patient states feels like his fluid is building up. Patient also complains of epigastric/upper abdominal pain. Last radiation 10/2. Last chemo in July. FINDINGS: CHEST: PULMONARY ARTERIES: Pulmonary arteries are adequately opacified for evaluation. No intraluminal filling defect to suggest pulmonary embolism. Main pulmonary artery is normal in caliber. MEDIASTINUM: No mediastinal lymphadenopathy. The heart and pericardium demonstrate no acute abnormality. Aortic atherosclerotic calcification and coronary artery calcifications are noted. There is no acute abnormality of the thoracic aorta. LUNGS AND PLEURA: The lungs demonstrate emphysema with diffuse bronchial wall thickening. Asymmetric scarring versus subsegmental atelectasis with mild ground-glass attenuation is noted within the left base, likely reflecting hypoinflation. No consolidative change. Calcified granuloma is noted in the left base. No pleural effusion or pneumothorax. SOFT TISSUES AND BONES: No acute bone or soft tissue abnormality. Thoracic spondylosis noted. ABDOMEN AND PELVIS: LIVER:  The liver is unremarkable. GALLBLADDER AND BILE DUCTS: Gallbladder is unremarkable. No biliary ductal dilatation. SPLEEN: Spleen demonstrates no acute abnormality. PANCREAS: Pancreas is normal in size and contour without focal lesion or ductal dilatation. ADRENAL GLANDS: Unchanged diffuse thickening of the adrenal glands is noted, this is stable when compared with prior imaging, likely reflecting underlying adenomatous hyperplasia. KIDNEYS, URETERS AND BLADDER: Bilateral bosniak class 1 and class 2 kidney cysts are noted. The largest cyst arises off the upper pole of the right kidney, measuring 2 cm. According to consensus criteria, no specific follow-up imaging is recommended. No stones in the kidneys or ureters. No hydronephrosis. No perinephric or periureteral stranding. Urinary bladder is unremarkable. GI AND BOWEL: Mild wall thickening of the proximal stomach just below the GE junction is noted, this patient has a history of known gastric malignancy. The appearance is unchanged compared with recent PET/CT where mild residual hypermetabolism was identified. There is no pathologic dilatation of the large or small bowel loops. The appendix is visualized and normal in caliber, without wall thickening, periappendiceal inflammation, or fluid. Moderate stool burden is noted within the ascending and transverse colon. The sigmoid colon demonstrates diverticulosis without evidence of diverticulitis. No bowel wall thickening, pericolonic stranding, abscess, or free air is seen. REPRODUCTIVE: Prostate gland appears normal. PERITONEUM AND RETROPERITONEUM: Mild venous congestion is noted within the central mesentery. No free fluid or fluid collections. No signs of pneumoperitoneum. Small fat-containing umbilical hernia. LYMPH NODES: No enlarged abdominal or pelvic lymph nodes. BONES AND SOFT TISSUES: No acute or suspicious osseous abnormality. IMPRESSION: 1. No evidence of pulmonary embolism. 2. No acute findings within the  abdomen or pelvis 3. Unchanged proximal gastric wall thickening  at the GE junction consistent with known gastric malignancy; no measurable mass lesion identified. 4. Aortic atherosclerotic calcification and coronary artery calcification. Electronically signed by: Waddell Calk MD 07/09/2024 11:22 AM EDT RP Workstation: [id number]   DG Chest 2 View Result Date: 07/09/2024 EXAM: 2 VIEW(S) XRAY OF THE CHEST 07/09/2024 08:20:00 AM COMPARISON: None available. CLINICAL HISTORY: SOB. C/o heart palpitations, hypotension, shortness of breath worsening over the past few days. States feels like his fluid is building up. Hx - GE junction adenocarcinoma ; ; Last radiation 10/2. Last chemo in July. FINDINGS: LINES, TUBES AND DEVICES: Right IJ port catheter extends to mid SVC. LUNGS AND PLEURA: Hyperinflated lungs with attenuated bronchovascular markings. Linear scarring or subsegmental atelectasis at left lung base. No pulmonary edema. No pleural effusion. No pneumothorax. HEART AND MEDIASTINUM: Aortic arch calcifications. No acute abnormality of the cardiac and mediastinal silhouettes. BONES AND SOFT TISSUES: Anterior vertebral endplate spurring at multiple levels in mid and lower thoracic spine. IMPRESSION: 1. Hyperinflated lungs with linear scarring or subsegmental atelectasis at the left lung base. 2. Right internal jugular port catheter with tip in the mid SVC. Electronically signed by: Waddell Calk MD 07/09/2024 08:30 AM EDT RP Workstation: [id number]    Impression Stage III (cT3, N1, M0) gastric adenocarcinoma   The patient is recovering from the effects of radiation.  ***  Plan:  ***   *** minutes of total time was spent for this patient encounter, including preparation, face-to-face counseling with the patient and coordination of care, physical exam, and documentation of the encounter. ____________________________________  Lynwood CHARM Nasuti, PhD, MD  This document serves as a record of services  personally performed by Lynwood Nasuti, MD. It was created on his behalf by Reymundo Cartwright, a trained medical scribe. The creation of this record is based on the scribe's personal observations and the provider's statements to them. This document has been checked and approved by the attending provider.

## 2024-08-02 NOTE — Progress Notes (Signed)
 Nutrition follow up: Reached out to patient's mobile.  He screened on MST report. He has completed his radiation and is no longer taking Xeloda .  Plan is for PET next month and potential immunotherapy with goal to avoid surgery. MD reports reduced odynophagia with some dysgeusia and bitterness.  Patient reports much progress with healing and ability to return to eating more normal textures.  Was using ONS but no longer needs. Portions are returning to normal.  Can eat a whole whopper now. Feeling more energetic, able to do yard work now.    Medications: reviewed   Labs: reviewed 07/28/24   Anthropometrics: weight loss 11# past 3 months    Height: 67 Weight:  07/28/24  150# 06/16/24  153# 05/08/24  161# UBW: 165-175# BMI: 23.49     Estimated Energy Needs   Kcals: 2000-2400 Protein: 82-102 g Fluid: 2.5 L    NUTRITION DIAGNOSIS: Food and Nutrition Related Knowledge Deficit related to cancer and associated treatments as evidenced by no prior need for nutrition related information. Resolving.   INTERVENTION:    Encouraged protein pacing.  Described plate method for portioning food groups.  Encouraged minimally processed foods.  Relayed AICR guidelines for red meat.  Attempted to send graphic in email with Nutrition during cancer survivorship tip sheet, email error came back.  Will mail via [employer].    MONITORING, EVALUATION, GOAL: weight trends, nutrition impact symptoms, PO intake, labs   Encouraged weight maintenance.   Next Visit: PRN at patient or provider request.   Micheline Craven, RDN, LDN Registered Dietitian, [facility] Part Time Remote (Usual office hours: Tuesday-Thursday) Mobile: [phone]

## 2024-08-03 ENCOUNTER — Encounter: Payer: Self-pay | Admitting: Radiation Oncology

## 2024-08-03 ENCOUNTER — Ambulatory Visit
Admission: RE | Admit: 2024-08-03 | Discharge: 2024-08-03 | Disposition: A | Discharge status: Home / Self Care | Source: Ambulatory Visit | Attending: Radiation Oncology | Admitting: Radiation Oncology

## 2024-08-03 VITALS — BP 115/78 | HR 72 | Temp 97.6°F | Resp 19 | Wt 153.0 lb

## 2024-08-03 DIAGNOSIS — C160 Malignant neoplasm of cardia: Secondary | ICD-10-CM | POA: Diagnosis not present

## 2024-08-03 DIAGNOSIS — Z923 Personal history of irradiation: Secondary | ICD-10-CM | POA: Diagnosis not present

## 2024-08-03 DIAGNOSIS — Z9221 Personal history of antineoplastic chemotherapy: Secondary | ICD-10-CM | POA: Insufficient documentation

## 2024-08-03 DIAGNOSIS — Z79899 Other long term (current) drug therapy: Secondary | ICD-10-CM | POA: Diagnosis not present

## 2024-08-03 DIAGNOSIS — C169 Malignant neoplasm of stomach, unspecified: Secondary | ICD-10-CM

## 2024-08-03 NOTE — Progress Notes (Signed)
 Matthew Lee is here today for follow up post radiation to the abdomen  They completed their radiation on: 06/29/24   Does the patient complain of any of the following:  Pain: No Abdominal bloating:  Yes at times.  Diarrhea/Constipation: Yes, both at times.  Nausea/Vomiting: Nausea   Urinary Issues (dysuria/incomplete emptying/ incontinence/ increased frequency/urgency): Nocturia  Post radiation skin changes: No Appetite: improving every day.  Weight:  Wt Readings from Last 3 Encounters:  08/03/24 153 lb (69.4 kg)  07/28/24 150 lb (68 kg)  07/09/24 148 lb (67.1 kg)     Additional comments if applicable:   BP 115/78   Pulse 72   Temp 97.6 F (36.4 C)   Resp 19   Wt 153 lb (69.4 kg)   SpO2 100%   BMI 23.96 kg/m

## 2024-08-04 ENCOUNTER — Encounter: Payer: Self-pay | Admitting: Family

## 2024-08-04 ENCOUNTER — Encounter: Payer: Self-pay | Admitting: Hematology & Oncology

## 2024-08-07 ENCOUNTER — Encounter: Payer: Self-pay | Admitting: Hematology & Oncology

## 2024-08-14 ENCOUNTER — Encounter

## 2024-08-30 ENCOUNTER — Inpatient Hospital Stay: Admitting: Dietician

## 2024-08-31 ENCOUNTER — Encounter: Payer: Self-pay | Admitting: *Deleted

## 2024-08-31 ENCOUNTER — Encounter (HOSPITAL_COMMUNITY): Admission: RE | Admit: 2024-08-31 | Source: Ambulatory Visit

## 2024-08-31 NOTE — Progress Notes (Signed)
 Patient cancelled PET and EGD because he doesn't want either procedure until January. PET now scheduled for 10/03/2023.   Oncology Nurse Navigator Documentation     08/31/2024    9:45 AM  Oncology Nurse Navigator Flowsheets  Navigator Follow Up Date: 09/15/2024  Navigator Follow Up Reason: Follow-up Appointment  Navigator Location [facility]  Navigator Encounter Type Appt/Treatment Plan Review  Patient Visit Type MedOnc  Treatment Phase Post-Tx Follow-up  Barriers/Navigation Needs Coordination of Care  Interventions None Required  Acuity Level 1-No Barriers  Support Groups/Services Friends and Family  Time Spent with Patient 15

## 2024-09-01 ENCOUNTER — Encounter: Admitting: Internal Medicine

## 2024-09-14 ENCOUNTER — Other Ambulatory Visit: Payer: Self-pay | Admitting: *Deleted

## 2024-09-14 DIAGNOSIS — C169 Malignant neoplasm of stomach, unspecified: Secondary | ICD-10-CM

## 2024-09-14 DIAGNOSIS — C160 Malignant neoplasm of cardia: Secondary | ICD-10-CM

## 2024-09-14 DIAGNOSIS — D509 Iron deficiency anemia, unspecified: Secondary | ICD-10-CM

## 2024-09-15 ENCOUNTER — Ambulatory Visit: Payer: Self-pay | Admitting: Hematology & Oncology

## 2024-09-15 ENCOUNTER — Inpatient Hospital Stay: Attending: Hematology & Oncology

## 2024-09-15 ENCOUNTER — Encounter: Payer: Self-pay | Admitting: *Deleted

## 2024-09-15 ENCOUNTER — Inpatient Hospital Stay

## 2024-09-15 ENCOUNTER — Inpatient Hospital Stay: Admitting: Hematology & Oncology

## 2024-09-15 ENCOUNTER — Other Ambulatory Visit: Payer: Self-pay

## 2024-09-15 ENCOUNTER — Encounter: Payer: Self-pay | Admitting: Hematology & Oncology

## 2024-09-15 VITALS — BP 151/83 | HR 73 | Temp 97.8°F | Resp 18 | Ht 67.0 in | Wt 156.1 lb

## 2024-09-15 DIAGNOSIS — C169 Malignant neoplasm of stomach, unspecified: Secondary | ICD-10-CM | POA: Diagnosis not present

## 2024-09-15 DIAGNOSIS — C160 Malignant neoplasm of cardia: Secondary | ICD-10-CM

## 2024-09-15 DIAGNOSIS — Z9221 Personal history of antineoplastic chemotherapy: Secondary | ICD-10-CM | POA: Diagnosis not present

## 2024-09-15 DIAGNOSIS — D509 Iron deficiency anemia, unspecified: Secondary | ICD-10-CM | POA: Insufficient documentation

## 2024-09-15 DIAGNOSIS — Z923 Personal history of irradiation: Secondary | ICD-10-CM | POA: Insufficient documentation

## 2024-09-15 LAB — CMP (CANCER CENTER ONLY)
ALT: 17 U/L (ref 0–44)
AST: 20 U/L (ref 15–41)
Albumin: 4.5 g/dL (ref 3.5–5.0)
Alkaline Phosphatase: 106 U/L (ref 38–126)
Anion gap: 11 (ref 5–15)
BUN: 17 mg/dL (ref 8–23)
CO2: 24 mmol/L (ref 22–32)
Calcium: 9.3 mg/dL (ref 8.9–10.3)
Chloride: 103 mmol/L (ref 98–111)
Creatinine: 0.73 mg/dL (ref 0.61–1.24)
GFR, Estimated: >60 mL/min
Glucose, Bld: 115 mg/dL — ABNORMAL HIGH (ref 70–99)
Potassium: 4.1 mmol/L (ref 3.5–5.1)
Sodium: 138 mmol/L (ref 135–145)
Total Bilirubin: 0.7 mg/dL (ref 0.0–1.2)
Total Protein: 7.1 g/dL (ref 6.5–8.1)

## 2024-09-15 LAB — CBC WITH DIFFERENTIAL (CANCER CENTER ONLY)
Abs Immature Granulocytes: 0.02 K/uL (ref 0.00–0.07)
Basophils Absolute: 0 K/uL (ref 0.0–0.1)
Basophils Relative: 1 %
Eosinophils Absolute: 0.2 K/uL (ref 0.0–0.5)
Eosinophils Relative: 3 %
HCT: 42.7 % (ref 39.0–52.0)
Hemoglobin: 14.1 g/dL (ref 13.0–17.0)
Immature Granulocytes: 0 %
Lymphocytes Relative: 16 %
Lymphs Abs: 1.2 K/uL (ref 0.7–4.0)
MCH: 29.1 pg (ref 26.0–34.0)
MCHC: 33 g/dL (ref 30.0–36.0)
MCV: 88.2 fL (ref 80.0–100.0)
Monocytes Absolute: 0.8 K/uL (ref 0.1–1.0)
Monocytes Relative: 10 %
Neutro Abs: 5.4 K/uL (ref 1.7–7.7)
Neutrophils Relative %: 70 %
Platelet Count: 217 K/uL (ref 150–400)
RBC: 4.84 MIL/uL (ref 4.22–5.81)
RDW: 12.9 % (ref 11.5–15.5)
WBC Count: 7.6 K/uL (ref 4.0–10.5)
nRBC: 0 % (ref 0.0–0.2)

## 2024-09-15 LAB — FERRITIN: Ferritin: 83 ng/mL (ref 24–336)

## 2024-09-15 LAB — IRON AND IRON BINDING CAPACITY (CC-WL,HP ONLY)
Iron: 126 ug/dL (ref 45–182)
Saturation Ratios: 29 % (ref 17.9–39.5)
TIBC: 437 ug/dL (ref 250–450)
UIBC: 311 ug/dL

## 2024-09-15 LAB — LACTATE DEHYDROGENASE: LDH: 128 U/L (ref 105–235)

## 2024-09-15 NOTE — Progress Notes (Signed)
 Patient is here for post treatment visit, however he rescheduled his restaging for next month. Will need to bring him back once restaging complete. His PET is scheduled for 10/02/2024.   Reached out to Rock Mania RN at Dr [facility] office to determine if patient is scheduled for endoscopy.   Oncology Nurse Navigator Documentation     09/15/2024    9:15 AM  Oncology Nurse Navigator Flowsheets  Navigator Follow Up Date: 10/02/2024  Navigator Follow Up Reason: Scan Review  Navigator Location [facility]  Navigator Encounter Type Follow-up Appt  Patient Visit Type MedOnc  Treatment Phase Post-Tx Follow-up  Barriers/Navigation Needs Coordination of Care  Interventions Coordination of Care  Acuity Level 1-No Barriers  Coordination of Care Other  Support Groups/Services Friends and Family  Time Spent with Patient 15

## 2024-09-15 NOTE — Patient Instructions (Signed)

## 2024-09-15 NOTE — Progress Notes (Signed)
 " Hematology and Oncology Follow Up Visit  Matthew Lee [id number] 1956-09-22 68 y.o. 09/15/2024   Principle Diagnosis:  Locally advanced adenocarcinoma of the stomach Iron deficiency anemia-GI bleed  Current Therapy:   Neoadjuvant FLOT-s/p cycle #2 - start on 02/17/2024 Feraheme given on 02/17/2024 XRT + Xeloda  (2000 mg po q day) -completed on 06/29/2024     Interim History:  Mr. Matthew Lee is back for follow-up.  Unfortunately, he has not yet had his PET scan or his upper endoscopy.  This will be set up in early January.  He feels good.  He has had no dysphagia or odynophagia.  He has had no nausea or vomiting.  Send no change in bowel or bladder habits.  He has had no cough.  He has had no bleeding.  He has had no fever.  He is retired from working out.  He certainly is enjoying his retirement.  He says there is so much less stress on his body now.  Currently, I would have to say that his performance status is probably ECOG 1.    Medications:  Current Outpatient Medications:    amLODipine  (NORVASC ) 10 MG tablet, TAKE 1 TABLET DAILY, Disp: 90 tablet, Rfl: 3   atorvastatin  (LIPITOR) 10 MG tablet, Take 1 tablet (10 mg total) by mouth daily., Disp: 90 tablet, Rfl: 3   docusate sodium  (COLACE) 50 MG capsule, Take 50 mg by mouth daily as needed for mild constipation., Disp: , Rfl:    labetalol  (NORMODYNE ) 100 MG tablet, Take 1.5 tablets (150 mg total) by mouth 2 (two) times daily., Disp: 270 tablet, Rfl: 0   ondansetron  (ZOFRAN ) 8 MG tablet, Take 8 mg by mouth 3 (three) times daily., Disp: , Rfl:    sennosides-docusate sodium  (SENOKOT-S) 8.6-50 MG tablet, Take 1 tablet by mouth daily as needed for constipation., Disp: , Rfl:    zolpidem  (AMBIEN ) 5 MG tablet, Take 1 tablet (5 mg total) by mouth at bedtime as needed for sleep., Disp: 15 tablet, Rfl: 0  Allergies: No Known Allergies  Past Medical History, Surgical history, Social history, and Family History were reviewed and updated.  Review of  Systems: Review of Systems  Constitutional: Negative.   HENT:  Negative.    Eyes: Negative.   Respiratory: Negative.    Cardiovascular: Negative.   Gastrointestinal: Negative.   Endocrine: Negative.   Genitourinary: Negative.    Musculoskeletal: Negative.   Skin: Negative.   Neurological: Negative.   Hematological: Negative.   Psychiatric/Behavioral: Negative.      Physical Exam: Vital signs show temperature of 97.7.  Pulse 80.  Blood pressure 127/78.  Weight is 156 pounds.    Wt Readings from Last 3 Encounters:  09/15/24 156 lb 1.6 oz (70.8 kg)  08/03/24 153 lb (69.4 kg)  07/28/24 150 lb (68 kg)    Physical Exam Vitals reviewed.  HENT:     Head: Normocephalic and atraumatic.  Eyes:     Pupils: Pupils are equal, round, and reactive to light.  Cardiovascular:     Rate and Rhythm: Normal rate and regular rhythm.     Heart sounds: Normal heart sounds.  Pulmonary:     Effort: Pulmonary effort is normal.     Breath sounds: Normal breath sounds.  Abdominal:     General: Bowel sounds are normal.     Palpations: Abdomen is soft.  Musculoskeletal:        General: No tenderness or deformity. Normal range of motion.     Cervical back:  Normal range of motion.  Lymphadenopathy:     Cervical: No cervical adenopathy.  Skin:    General: Skin is warm and dry.     Findings: No erythema or [place].  Neurological:     Mental Status: He is alert and oriented to person, place, and time.  Psychiatric:        Behavior: Behavior normal.        Thought Content: Thought content normal.        Judgment: Judgment normal.      Lab Results  Component Value Date   WBC 7.6 09/15/2024   HGB 14.1 09/15/2024   HCT 42.7 09/15/2024   MCV 88.2 09/15/2024   PLT 217 09/15/2024     Chemistry      Component Value Date/Time   NA 135 07/28/2024 0825   K 3.9 07/28/2024 0825   CL 101 07/28/2024 0825   CO2 23 07/28/2024 0825   BUN 16 07/28/2024 0825   CREATININE 0.65 07/28/2024 0825    CREATININE 0.85 01/21/2018 1500      Component Value Date/Time   CALCIUM  9.3 07/28/2024 0825   ALKPHOS 113 07/28/2024 0825   AST 30 07/28/2024 0825   ALT 26 07/28/2024 0825   BILITOT 0.9 07/28/2024 0825      Impression and Plan: Mr. Matthew Lee is a very nice 68 year old white male.  He has hopefully localized adenocarcinoma of the stomach.  He has completed all of his treatment.  Again, he did not wish to have surgery.  We really have to wait until he has his upper endoscopy and the PET scan before we really make any further determinations.  I will plan to get him back in about a month or so.  By then, we will have the results back from his scan and his upper endoscopy.  Hopefully, he will be in remission..    We may want to consider immunotherapy as a maintenance protocol.     Maude JONELLE Crease, MD 12/19/20258:47 AM "

## 2024-09-25 ENCOUNTER — Encounter: Payer: Self-pay | Admitting: Family

## 2024-09-25 ENCOUNTER — Encounter: Payer: Self-pay | Admitting: Hematology & Oncology

## 2024-09-25 ENCOUNTER — Telehealth: Payer: Self-pay

## 2024-09-25 NOTE — Telephone Encounter (Signed)
 Tracy, Jamie E, RN  Perley Rock SAUNDERS, RN I believe this was for a post treatment endo for  [place]. Matthew Lee, 68 y.o., 1956-03-25 [phone] MRN: [id number]  Thanks for reaching out  Jamie  Pt scheduled for previsit 10/13/24 at 9am, EGD scheduled in the [facility] 11/01/24 at 9am. Pt aware of appts.

## 2024-09-26 ENCOUNTER — Other Ambulatory Visit: Payer: Self-pay | Admitting: Cardiology

## 2024-09-29 ENCOUNTER — Encounter: Payer: Self-pay | Admitting: Hematology & Oncology

## 2024-10-02 ENCOUNTER — Encounter (HOSPITAL_COMMUNITY)
Admission: RE | Admit: 2024-10-02 | Discharge: 2024-10-02 | Disposition: A | Discharge status: Home / Self Care | Source: Ambulatory Visit | Attending: Hematology & Oncology | Admitting: Hematology & Oncology

## 2024-10-02 DIAGNOSIS — C169 Malignant neoplasm of stomach, unspecified: Secondary | ICD-10-CM | POA: Diagnosis present

## 2024-10-02 LAB — GLUCOSE, CAPILLARY: Glucose-Capillary: 108 mg/dL — ABNORMAL HIGH (ref 70–99)

## 2024-10-02 MED ORDER — FLUDEOXYGLUCOSE F - 18 (FDG) INJECTION
7.7000 | Freq: Once | INTRAVENOUS | Status: AC
  Administered 2024-10-02: 7.7 via INTRAVENOUS

## 2024-10-09 ENCOUNTER — Encounter: Payer: Self-pay | Admitting: *Deleted

## 2024-10-09 NOTE — Progress Notes (Signed)
 Reviewed PET which residual metabolism. Patient will have an EGD on 11/01/2024 for visual inspection and possible biopsies.   Oncology Nurse Navigator Documentation     10/09/2024    8:30 AM  Oncology Nurse Navigator Flowsheets  Navigator Follow Up Date: 11/01/2024  Navigator Follow Up Reason: Other:  Navigator Location [facility]  Navigator Encounter Type Scan Review  Patient Visit Type MedOnc  Treatment Phase Post-Tx Follow-up  Barriers/Navigation Needs Coordination of Care  Interventions None Required  Acuity Level 1-No Barriers  Support Groups/Services Friends and Family  Time Spent with Patient 15

## 2024-10-13 ENCOUNTER — Ambulatory Visit

## 2024-10-13 VITALS — Ht 67.0 in | Wt 159.2 lb

## 2024-10-13 DIAGNOSIS — D490 Neoplasm of unspecified behavior of digestive system: Secondary | ICD-10-CM

## 2024-10-13 DIAGNOSIS — D509 Iron deficiency anemia, unspecified: Secondary | ICD-10-CM

## 2024-10-13 NOTE — Progress Notes (Signed)
 No egg or soy allergy known to patient  No issues known to pt with past sedation with any surgeries or procedures Patient denies ever being told they had issues or difficulty with intubation  No FH of Malignant Hyperthermia Pt is not on diet pills Pt is not on  home 02  Pt is not on blood thinners  Pt has issues with constipation and takes Miralax  daily, was instructed to continue this until procedure  No A fib or A flutter Have any cardiac testing pending--No Pt can ambulate  Pt denies use of chewing tobacco Discussed diabetic I weight loss medication holds Discussed NSAID holds Checked BMI Pt instructed to use Singlecare.com or GoodRx for a price reduction on prep  Patient's chart reviewed by Norleen Schillings CNRA prior to previsit and patient appropriate for the [facility].  Pre visit completed and red dot placed by patient's name on their procedure day (on provider's schedule).

## 2024-10-20 ENCOUNTER — Inpatient Hospital Stay

## 2024-10-20 ENCOUNTER — Inpatient Hospital Stay: Admitting: Hematology & Oncology

## 2024-10-23 ENCOUNTER — Encounter: Payer: Self-pay | Admitting: Internal Medicine

## 2024-11-01 ENCOUNTER — Encounter: Payer: Self-pay | Admitting: *Deleted

## 2024-11-01 ENCOUNTER — Encounter: Payer: Self-pay | Admitting: Internal Medicine

## 2024-11-01 ENCOUNTER — Ambulatory Visit: Admitting: Internal Medicine

## 2024-11-01 VITALS — BP 131/74 | HR 67 | Temp 97.3°F | Resp 14 | Ht 67.0 in | Wt 159.2 lb

## 2024-11-01 DIAGNOSIS — K222 Esophageal obstruction: Secondary | ICD-10-CM | POA: Diagnosis not present

## 2024-11-01 DIAGNOSIS — C160 Malignant neoplasm of cardia: Secondary | ICD-10-CM | POA: Diagnosis present

## 2024-11-01 DIAGNOSIS — K317 Polyp of stomach and duodenum: Secondary | ICD-10-CM

## 2024-11-01 DIAGNOSIS — D132 Benign neoplasm of duodenum: Secondary | ICD-10-CM | POA: Diagnosis not present

## 2024-11-01 MED ORDER — SODIUM CHLORIDE 0.9 % IV SOLN: 500.0000 mL | Freq: Once | INTRAVENOUS | Status: DC

## 2024-11-01 NOTE — Op Note (Signed)
 [facility] Patient Name: Legacy Rivers Procedure Date: 11/01/2024 9:42 AM MRN: [id number] Endoscopist: Gordy CHRISTELLA Starch , MD, [id number] Age: 69 Referring MD:  Date of Birth: [birth year] Gender: Male Account #: [id number] Procedure:                Upper GI endoscopy Indications:              Follow-up of malignant adenocarcinoma of the                            gastric cardia (dx April 2025 s/p chemotherapy and                            radiation), recent PET scan with activity distal to                            GE junction without corresponding tumor on CT Medicines:                Monitored Anesthesia Care Procedure:                Pre-Anesthesia Assessment:                           - Prior to the procedure, a History and Physical                            was performed, and patient medications and                            allergies were reviewed. The patient's tolerance of                            previous anesthesia was also reviewed. The risks                            and benefits of the procedure and the sedation                            options and risks were discussed with the patient.                            All questions were answered, and informed consent                            was obtained. Prior Anticoagulants: The patient has                            taken no anticoagulant or antiplatelet agents. ASA                            Grade Assessment: III - A patient with severe                            systemic disease. After reviewing the risks and  benefits, the patient was deemed in satisfactory                            condition to undergo the procedure.                           After obtaining informed consent, the endoscope was                            passed under direct vision. Throughout the                            procedure, the patient's blood pressure, pulse, and                            oxygen  saturations were monitored continuously. The                            GIF [id number] #[id number] was introduced through the                            mouth, and advanced to the second part of duodenum.                            The upper GI endoscopy was accomplished without                            difficulty. The patient tolerated the procedure                            well. Scope In: Scope Out: Findings:                 Mild mucosal variance characterized by nodularity                            was found in the distal esophagus. Biopsies were                            taken with a cold forceps for histology.                           Two benign-appearing, intrinsic mild stenoses were                            found 40 to 41 cm from the incisors.                            Non-obstructing. The stenoses were traversed.                           Nodular mucosa was found in the cardia (just distal                            to GE junction around 43  cm from incisors).                            Biopsies were taken with a cold forceps for                            histology.                           The exam of the stomach was otherwise normal.                           A single 6 mm sessile polyp was found in the second                            portion of the duodenum. The polyp was removed with                            a cold snare. Resection and retrieval were complete. Complications:            No immediate complications. Estimated Blood Loss:     Estimated blood loss was minimal. Impression:               - Esophageal mucosal nodularity in the distal                            esophagus. Biopsied.                           - Benign-appearing esophageal stenoses                            (post-radiation and non-obstructing).                           - Nodular mucosa in the cardia (at site of previous                            tumor). Biopsied.                           - A  single duodenal polyp. Resected and retrieved. Recommendation:           - Patient has a contact number available for                            emergencies. The signs and symptoms of potential                            delayed complications were discussed with the                            patient. Return to normal activities tomorrow.                            Written discharge instructions were provided to the  patient.                           - Resume previous diet.                           - Continue present medications.                           - Await pathology results. Gordy CHRISTELLA Starch, MD 11/01/2024 10:24:09 AM This report has been signed electronically.

## 2024-11-01 NOTE — Patient Instructions (Signed)

## 2024-11-01 NOTE — Progress Notes (Signed)
 "   GASTROENTEROLOGY PROCEDURE H&P NOTE   Primary Care Physician: Daryl Setter, NP    Reason for Procedure:   Followup of gastric cardia/GE junction adenocarcinoma  Plan:    EGD  Patient is appropriate for endoscopic procedure(s) in the ambulatory ([facility]) setting.  The nature of the procedure, as well as the risks, benefits, and alternatives were carefully and thoroughly reviewed with the patient. Ample time for discussion and questions allowed.  All questions were answered. The patient understood, was satisfied, and agreed with the plan to proceed.    HPI: Matthew Lee is a 69 y.o. male who presents for EGD.  Medical history as below.  No recent chest pain or shortness of breath.  No abdominal pain today.  Past Medical History:  Diagnosis Date   Abnormal weight loss 11/02/2022   Aneurysm of thoracic aorta 09/2022   dilated to 4 cm on CT   Cholelithiasis 11/02/2022   Colon cancer screening 07/31/2022   Coronary artery calcification 10/08/2022   Diverticulitis    Dyspnea on exertion 11/11/2022   Elevated PSA 07/31/2022   Essential hypertension 09/26/2009   Qualifier: Diagnosis of   By: Janit MD, Ozell Sevin 11/11/2022   Gall bladder polyp 02/18/2015   Overview:   - 6 mm (as described from US  abd in 2015). Repeat Us  abd lim ordered.   Gastric adenocarcinoma (HCC) 12/2023   GERD (gastroesophageal reflux disease) 09/07/2013   Hepatitis C 08/24/2012   TX and cured per pt   History of COVID-19 07/31/2022   History of hepatitis C 11/11/2022   History of radiation therapy    Stomach-05/22/24-06/29/24- Dr. Lynwood Nasuti   History of tobacco abuse    Hyperglycemia 11/22/2014   Hypertension    long standing since 2007, was treated for sometime, but then discontinued taking meds on his own.   Immunization counseling 04/01/2022   Insomnia 10/31/2009   Qualifier: Diagnosis of   By: Arvella MD, Amanjot       Mild diastolic dysfunction 08/22/2012   Mixed dyslipidemia  11/11/2022   Preventative health care 12/31/2014   Prominent abdominal aortic pulsation 11/11/2022   Pulmonary edema, acute (HCC)    hospitalized in 09/2009 with hypertensive crisis and acute pulmonary edema- improved with lasix  nad labetalol .    Past Surgical History:  Procedure Laterality Date   CRANIOTOMY Right 01/25/2024   Procedure: CRANIOTOMY HEMATOMA EVACUATION SUBDURAL;  Surgeon: Debby Dorn MATSU, MD;  Location: [facility] OR;  Service: Neurosurgery;  Laterality: Right;   ESOPHAGOGASTRODUODENOSCOPY  08/21/2011   Procedure: ESOPHAGOGASTRODUODENOSCOPY (EGD);  Surgeon: Renaye Sous, MD;  Location: [facility];  Service: Endoscopy;  Laterality: N/A;   ESOPHAGOGASTRODUODENOSCOPY N/A 02/07/2024   Procedure: EGD (ESOPHAGOGASTRODUODENOSCOPY);  Surgeon: Wilhelmenia Aloha Raddle., MD;  Location: [facility];  Service: Gastroenterology;  Laterality: N/A;   EUS N/A 02/07/2024   Procedure: ULTRASOUND, UPPER GI TRACT, ENDOSCOPIC;  Surgeon: Wilhelmenia Aloha Raddle., MD;  Location: [facility];  Service: Gastroenterology;  Laterality: N/A;   IR IMAGING GUIDED PORT INSERTION  02/14/2024   IR US  LIVER BIOPSY  01/28/2024   TONSILLECTOMY     As a child    Prior to Admission medications  Medication Sig Start Date End Date Taking? Authorizing Provider  amLODipine  (NORVASC ) 10 MG tablet TAKE 1 TABLET DAILY 12/15/23  Yes O'Sullivan, Melissa, NP  labetalol  (NORMODYNE ) 100 MG tablet Take 1.5 tablets (150 mg total) by mouth 2 (two) times daily. 05/24/24  Yes Daryl Setter, NP  atorvastatin  (LIPITOR) 10  MG tablet Take 1 tablet (10 mg total) by mouth daily. 12/23/23 10/13/24  Revankar, Jennifer SAUNDERS, MD  ondansetron  (ZOFRAN ) 8 MG tablet Take 8 mg by mouth 3 (three) times daily. 07/10/24   [provider]  polyethylene glycol powder (GLYCOLAX /MIRALAX ) 17 GM/SCOOP powder Take 17 g by mouth daily. Dissolve 1 capful (17g) in 4-8 ounces of liquid and take by mouth daily.    [provider]   sennosides-docusate sodium  (SENOKOT-S) 8.6-50 MG tablet Take 1 tablet by mouth daily as needed for constipation.    [provider]    Current Outpatient Medications  Medication Sig Dispense Refill   amLODipine  (NORVASC ) 10 MG tablet TAKE 1 TABLET DAILY 90 tablet 3   labetalol  (NORMODYNE ) 100 MG tablet Take 1.5 tablets (150 mg total) by mouth 2 (two) times daily. 270 tablet 0   atorvastatin  (LIPITOR) 10 MG tablet Take 1 tablet (10 mg total) by mouth daily. 90 tablet 3   ondansetron  (ZOFRAN ) 8 MG tablet Take 8 mg by mouth 3 (three) times daily.     polyethylene glycol powder (GLYCOLAX /MIRALAX ) 17 GM/SCOOP powder Take 17 g by mouth daily. Dissolve 1 capful (17g) in 4-8 ounces of liquid and take by mouth daily.     sennosides-docusate sodium  (SENOKOT-S) 8.6-50 MG tablet Take 1 tablet by mouth daily as needed for constipation.     Current Facility-Administered Medications  Medication Dose Route Frequency Provider Last Rate Last Admin   0.9 %  sodium chloride  infusion  500 mL Intravenous Once Yecheskel Kurek, Gordy HERO, MD        Allergies as of 11/01/2024   (No Known Allergies)    Family History  Problem Relation Age of Onset   Polycystic kidney disease Father    Cancer Maternal Aunt    Polycystic kidney disease Half-Brother    Polycystic kidney disease Half-Brother    Polycystic kidney disease Half-Sister    Colon cancer Neg Hx    Esophageal cancer Neg Hx    Stomach cancer Neg Hx    Pancreatic cancer Neg Hx     Social History   Socioeconomic History   Marital status: Married    Spouse name: Not on file   Number of children: Not on file   Years of education: Not on file   Highest education level: Some college, no degree  Occupational History   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   Occupation: Garment/textile Technologist: [employer]  Tobacco Use   Smoking status: Former    Current packs/day: 0.00    Average packs/day: 1 pack/day for 40.0 years  (40.0 ttl pk-yrs)    Types: Cigarettes    Start date: 09/23/1969    Quit date: 09/23/2009    Years since quitting: 15.1   Smokeless tobacco: Never  Vaping Use   Vaping status: Never Used  Substance and Sexual Activity   Alcohol use: Yes    Alcohol/week: 6.0 standard drinks of alcohol    Types: 5 Glasses of wine, 1 Cans of beer per week    Comment: beer/day   Drug use: Not Currently    Types: Marijuana    Comment: last used 04/16/2024 evening   Sexual activity: Not Currently  Other Topics Concern   Not on file  Social History Narrative   Lives at home with wife, daughter and her 2 children.   Caffeine use:  1 daily   Works a aeronautical engineer (Museum/gallery Conservator)   Enjoys optician, dispensing, civil engineer, contracting.  Social Drivers of Health   Tobacco Use: Medium Risk (11/01/2024)   Patient History    Smoking Tobacco Use: Former    Smokeless Tobacco Use: Never    Passive Exposure: Not on file  Financial Resource Strain: Low Risk (05/09/2024)   Overall Financial Resource Strain (CARDIA)    Difficulty of Paying Living Expenses: Not hard at all  Food Insecurity: No Food Insecurity (05/09/2024)   Epic    Worried About Programme Researcher, Broadcasting/film/video in the Last Year: Never true    Ran Out of Food in the Last Year: Never true  Transportation Needs: No Transportation Needs (05/09/2024)   Epic    Lack of Transportation (Medical): No    Lack of Transportation (Non-Medical): No  Physical Activity: Inactive (05/09/2024)   Exercise Vital Sign    Days of Exercise per Week: 0 days    Minutes of Exercise per Session: 0 min  Stress: No Stress Concern Present (05/09/2024)   [employer] of Occupational Health - Occupational Stress Questionnaire    Feeling of Stress: Not at all  Social Connections: Moderately Isolated (05/09/2024)   Social Connection and Isolation Panel    Frequency of Communication with Friends and Family: More than three times a week    Frequency of Social Gatherings with Friends  and Family: More than three times a week    Attends Religious Services: Never    Database Administrator or Organizations: No    Attends Banker Meetings: Never    Marital Status: Married  Catering Manager Violence: Not At Risk (05/09/2024)   Epic    Fear of Current or Ex-Partner: No    Emotionally Abused: No    Physically Abused: No    Sexually Abused: No  Depression (PHQ2-9): Low Risk (09/15/2024)   Depression (PHQ2-9)    PHQ-2 Score: 0  Alcohol Screen: Low Risk (05/09/2024)   Alcohol Screen    Last Alcohol Screening Score (AUDIT): 0  Housing: Unknown (05/09/2024)   Epic    Unable to Pay for Housing in the Last Year: No    Number of Times Moved in the Last Year: Not on file    Homeless in the Last Year: No  Utilities: Not At Risk (05/09/2024)   Epic    Threatened with loss of utilities: No  Health Literacy: Adequate Health Literacy (05/09/2024)   B1300 Health Literacy    Frequency of need for help with medical instructions: Never    Physical Exam: Vital signs in last 24 hours: @BP  135/80   Pulse 72   Temp (!) 97.3 F (36.3 C)   Ht 5' 7 (1.702 m)   Wt 159 lb 3.2 oz (72.2 kg)   SpO2 97%   BMI 24.93 kg/m  GEN: NAD EYE: Sclerae anicteric ENT: MMM CV: Non-tachycardic Pulm: CTA b/l GI: Soft, NT/ND NEURO:  Alert & Oriented x 3   Gordy Starch, MD [facility]  11/01/2024 9:50 AM  "

## 2024-11-01 NOTE — Progress Notes (Signed)
 Patient had EGD today. Continued nodularity was seen. Biopsies taken. Will follow for path.   Oncology Nurse Navigator Documentation     11/01/2024   10:30 AM  Oncology Nurse Navigator Flowsheets  Navigator Follow Up Date: 11/07/2024  Navigator Follow Up Reason: Pathology  Navigator Location [facility]  Navigator Encounter Type Appt/Treatment Plan Review  Patient Visit Type MedOnc  Treatment Phase Post-Tx Follow-up  Barriers/Navigation Needs Coordination of Care  Interventions None Required  Acuity Level 1-No Barriers  Support Groups/Services Friends and Family  Time Spent with Patient 15

## 2024-11-01 NOTE — Progress Notes (Signed)
 Pt's states no medical or surgical changes since previsit or office visit.

## 2024-11-01 NOTE — Progress Notes (Signed)
 To pacu, VSS. Report to [email or web address]

## 2024-11-02 ENCOUNTER — Telehealth: Payer: Self-pay | Admitting: *Deleted

## 2024-11-02 NOTE — Telephone Encounter (Signed)
" °  Follow up Call-     11/01/2024    8:59 AM 04/17/2024    7:21 AM 01/20/2024    3:05 PM  Call back number  Post procedure Call Back phone  # [phone]- [phone]  Permission to leave phone message Yes Yes Yes     Patient questions:  Do you have a fever, pain , or abdominal swelling? No. Pain Score  0 *  Have you tolerated food without any problems? Yes.    Have you been able to return to your normal activities? Yes.    Do you have any questions about your discharge instructions: Diet   No. Medications  No. Follow up visit  No.  Do you have questions or concerns about your Care? No.  Actions: * If pain score is 4 or above: No action needed, pain <4.   "

## 2024-11-03 LAB — SURGICAL PATHOLOGY

## 2024-11-09 ENCOUNTER — Inpatient Hospital Stay: Admitting: Hematology & Oncology

## 2024-11-09 ENCOUNTER — Inpatient Hospital Stay

## 2024-11-09 ENCOUNTER — Inpatient Hospital Stay: Attending: Hematology & Oncology

## 2025-05-15 ENCOUNTER — Ambulatory Visit
